# Patient Record
Sex: Female | Born: 1955 | Race: White | Hispanic: No | Smoking: Never smoker
Health system: Southern US, Community
[De-identification: ages and names within clinical notes are randomized; demographics above are authoritative.]

## PROBLEM LIST (undated history)

## (undated) DIAGNOSIS — N201 Calculus of ureter: Secondary | ICD-10-CM

## (undated) DIAGNOSIS — Z8719 Personal history of other diseases of the digestive system: Secondary | ICD-10-CM

## (undated) DIAGNOSIS — N200 Calculus of kidney: Secondary | ICD-10-CM

## (undated) DIAGNOSIS — Z8619 Personal history of other infectious and parasitic diseases: Secondary | ICD-10-CM

## (undated) DIAGNOSIS — F32A Depression, unspecified: Secondary | ICD-10-CM

## (undated) DIAGNOSIS — I219 Acute myocardial infarction, unspecified: Secondary | ICD-10-CM

## (undated) DIAGNOSIS — E119 Type 2 diabetes mellitus without complications: Secondary | ICD-10-CM

## (undated) DIAGNOSIS — M2550 Pain in unspecified joint: Secondary | ICD-10-CM

## (undated) DIAGNOSIS — Z87442 Personal history of urinary calculi: Secondary | ICD-10-CM

## (undated) DIAGNOSIS — I499 Cardiac arrhythmia, unspecified: Secondary | ICD-10-CM

## (undated) DIAGNOSIS — F419 Anxiety disorder, unspecified: Secondary | ICD-10-CM

## (undated) DIAGNOSIS — R3915 Urgency of urination: Secondary | ICD-10-CM

## (undated) DIAGNOSIS — M1990 Unspecified osteoarthritis, unspecified site: Secondary | ICD-10-CM

## (undated) HISTORY — PX: CARDIAC CATHETERIZATION: SHX172

## (undated) HISTORY — PX: CHOLECYSTECTOMY: SHX55

## (undated) HISTORY — DX: Type 2 diabetes mellitus without complications: E11.9

## (undated) HISTORY — DX: Pain in unspecified joint: M25.50

---

## 1981-05-27 HISTORY — PX: BREAST EXCISIONAL BIOPSY: SUR124

## 1988-05-27 HISTORY — PX: TUBAL LIGATION: SHX77

## 1998-05-27 HISTORY — PX: NISSEN FUNDOPLICATION: SHX2091

## 2002-05-27 HISTORY — PX: WRIST FUSION: SHX839

## 2002-10-18 ENCOUNTER — Encounter: Admission: RE | Admit: 2002-10-18 | Discharge: 2002-10-18 | Payer: Self-pay | Admitting: Family Medicine

## 2002-10-18 ENCOUNTER — Encounter: Payer: Self-pay | Admitting: Family Medicine

## 2002-11-24 ENCOUNTER — Encounter: Admission: RE | Admit: 2002-11-24 | Discharge: 2002-11-24 | Payer: Self-pay | Admitting: Family Medicine

## 2002-11-24 ENCOUNTER — Encounter: Payer: Self-pay | Admitting: Family Medicine

## 2002-12-22 ENCOUNTER — Encounter: Admission: RE | Admit: 2002-12-22 | Discharge: 2002-12-22 | Payer: Self-pay | Admitting: Family Medicine

## 2002-12-22 ENCOUNTER — Encounter: Payer: Self-pay | Admitting: Family Medicine

## 2003-01-06 ENCOUNTER — Other Ambulatory Visit: Admission: RE | Admit: 2003-01-06 | Discharge: 2003-01-06 | Payer: Self-pay

## 2003-01-10 ENCOUNTER — Encounter: Admission: RE | Admit: 2003-01-10 | Discharge: 2003-01-10 | Payer: Self-pay

## 2003-01-25 ENCOUNTER — Ambulatory Visit (HOSPITAL_COMMUNITY): Admission: RE | Admit: 2003-01-25 | Discharge: 2003-01-25 | Payer: Self-pay

## 2003-01-25 ENCOUNTER — Encounter (INDEPENDENT_AMBULATORY_CARE_PROVIDER_SITE_OTHER): Payer: Self-pay | Admitting: Specialist

## 2003-01-25 HISTORY — PX: HYSTEROSCOPY WITH D & C: SHX1775

## 2003-02-03 ENCOUNTER — Encounter (INDEPENDENT_AMBULATORY_CARE_PROVIDER_SITE_OTHER): Payer: Self-pay | Admitting: *Deleted

## 2003-02-03 ENCOUNTER — Ambulatory Visit (HOSPITAL_COMMUNITY): Admission: RE | Admit: 2003-02-03 | Discharge: 2003-02-03 | Payer: Self-pay | Admitting: Gastroenterology

## 2003-03-02 ENCOUNTER — Encounter (INDEPENDENT_AMBULATORY_CARE_PROVIDER_SITE_OTHER): Payer: Self-pay

## 2003-03-02 ENCOUNTER — Observation Stay (HOSPITAL_COMMUNITY): Admission: RE | Admit: 2003-03-02 | Discharge: 2003-03-03 | Payer: Self-pay | Admitting: Obstetrics & Gynecology

## 2003-03-02 HISTORY — PX: LAPAROSCOPIC ASSISTED VAGINAL HYSTERECTOMY: SHX5398

## 2004-07-24 ENCOUNTER — Encounter: Admission: RE | Admit: 2004-07-24 | Discharge: 2004-07-24 | Payer: Self-pay | Admitting: Family Medicine

## 2006-05-14 ENCOUNTER — Encounter: Admission: RE | Admit: 2006-05-14 | Discharge: 2006-05-14 | Payer: Self-pay | Admitting: Family Medicine

## 2006-07-01 ENCOUNTER — Inpatient Hospital Stay (HOSPITAL_COMMUNITY): Admission: RE | Admit: 2006-07-01 | Discharge: 2006-07-04 | Payer: Self-pay | Admitting: Orthopedic Surgery

## 2006-07-01 HISTORY — PX: TOTAL KNEE ARTHROPLASTY: SHX125

## 2007-01-10 ENCOUNTER — Emergency Department (HOSPITAL_COMMUNITY): Admission: EM | Admit: 2007-01-10 | Discharge: 2007-01-10 | Payer: Self-pay | Admitting: Emergency Medicine

## 2007-05-25 ENCOUNTER — Emergency Department (HOSPITAL_COMMUNITY): Admission: EM | Admit: 2007-05-25 | Discharge: 2007-05-25 | Payer: Self-pay | Admitting: *Deleted

## 2007-05-28 ENCOUNTER — Emergency Department (HOSPITAL_COMMUNITY): Admission: EM | Admit: 2007-05-28 | Discharge: 2007-05-28 | Payer: Self-pay | Admitting: Emergency Medicine

## 2007-07-21 ENCOUNTER — Encounter: Admission: RE | Admit: 2007-07-21 | Discharge: 2007-07-21 | Payer: Self-pay | Admitting: Orthopedic Surgery

## 2007-11-08 ENCOUNTER — Emergency Department (HOSPITAL_COMMUNITY): Admission: EM | Admit: 2007-11-08 | Discharge: 2007-11-08 | Payer: Self-pay | Admitting: Family Medicine

## 2007-11-13 ENCOUNTER — Emergency Department (HOSPITAL_COMMUNITY): Admission: EM | Admit: 2007-11-13 | Discharge: 2007-11-13 | Payer: Self-pay | Admitting: Emergency Medicine

## 2009-02-10 ENCOUNTER — Inpatient Hospital Stay (HOSPITAL_COMMUNITY): Admission: EM | Admit: 2009-02-10 | Discharge: 2009-02-14 | Payer: Self-pay | Admitting: Emergency Medicine

## 2009-02-10 ENCOUNTER — Ambulatory Visit: Payer: Self-pay | Admitting: Internal Medicine

## 2009-02-11 ENCOUNTER — Encounter: Payer: Self-pay | Admitting: Internal Medicine

## 2009-03-03 ENCOUNTER — Emergency Department (HOSPITAL_COMMUNITY): Admission: EM | Admit: 2009-03-03 | Discharge: 2009-03-03 | Payer: Self-pay | Admitting: Emergency Medicine

## 2009-03-15 ENCOUNTER — Encounter (INDEPENDENT_AMBULATORY_CARE_PROVIDER_SITE_OTHER): Payer: Self-pay | Admitting: *Deleted

## 2009-03-15 ENCOUNTER — Ambulatory Visit: Payer: Self-pay | Admitting: Gastroenterology

## 2009-03-15 DIAGNOSIS — R1319 Other dysphagia: Secondary | ICD-10-CM | POA: Insufficient documentation

## 2009-03-15 DIAGNOSIS — K219 Gastro-esophageal reflux disease without esophagitis: Secondary | ICD-10-CM | POA: Insufficient documentation

## 2009-03-15 DIAGNOSIS — R079 Chest pain, unspecified: Secondary | ICD-10-CM | POA: Insufficient documentation

## 2009-03-15 DIAGNOSIS — R1013 Epigastric pain: Secondary | ICD-10-CM | POA: Insufficient documentation

## 2009-03-30 ENCOUNTER — Encounter: Payer: Self-pay | Admitting: Gastroenterology

## 2009-03-30 ENCOUNTER — Encounter (INDEPENDENT_AMBULATORY_CARE_PROVIDER_SITE_OTHER): Payer: Self-pay

## 2009-03-30 ENCOUNTER — Ambulatory Visit: Payer: Self-pay | Admitting: Gastroenterology

## 2009-03-30 LAB — CONVERTED CEMR LAB: UREASE: NEGATIVE

## 2009-03-31 ENCOUNTER — Encounter: Payer: Self-pay | Admitting: Gastroenterology

## 2009-03-31 ENCOUNTER — Telehealth: Payer: Self-pay | Admitting: Gastroenterology

## 2009-04-01 ENCOUNTER — Telehealth: Payer: Self-pay | Admitting: Internal Medicine

## 2009-04-04 ENCOUNTER — Telehealth: Payer: Self-pay | Admitting: Gastroenterology

## 2009-04-04 ENCOUNTER — Ambulatory Visit: Payer: Self-pay | Admitting: Internal Medicine

## 2009-04-04 ENCOUNTER — Observation Stay (HOSPITAL_COMMUNITY): Admission: EM | Admit: 2009-04-04 | Discharge: 2009-04-05 | Payer: Self-pay | Admitting: Emergency Medicine

## 2009-04-13 ENCOUNTER — Ambulatory Visit: Payer: Self-pay | Admitting: Gastroenterology

## 2009-05-29 ENCOUNTER — Encounter (INDEPENDENT_AMBULATORY_CARE_PROVIDER_SITE_OTHER): Payer: Self-pay | Admitting: *Deleted

## 2009-05-29 ENCOUNTER — Ambulatory Visit: Payer: Self-pay | Admitting: Gastroenterology

## 2009-05-29 DIAGNOSIS — K269 Duodenal ulcer, unspecified as acute or chronic, without hemorrhage or perforation: Secondary | ICD-10-CM | POA: Insufficient documentation

## 2009-05-30 ENCOUNTER — Ambulatory Visit: Payer: Self-pay | Admitting: Cardiovascular Disease

## 2009-05-30 DIAGNOSIS — K449 Diaphragmatic hernia without obstruction or gangrene: Secondary | ICD-10-CM | POA: Insufficient documentation

## 2009-06-01 ENCOUNTER — Inpatient Hospital Stay (HOSPITAL_COMMUNITY): Admission: EM | Admit: 2009-06-01 | Discharge: 2009-06-03 | Payer: Self-pay | Admitting: Emergency Medicine

## 2009-06-12 ENCOUNTER — Encounter: Payer: Self-pay | Admitting: Internal Medicine

## 2009-06-12 ENCOUNTER — Encounter: Payer: Self-pay | Admitting: Gastroenterology

## 2009-06-13 ENCOUNTER — Telehealth: Payer: Self-pay | Admitting: Gastroenterology

## 2009-06-16 ENCOUNTER — Emergency Department (HOSPITAL_COMMUNITY): Admission: EM | Admit: 2009-06-16 | Discharge: 2009-06-16 | Payer: Self-pay | Admitting: Emergency Medicine

## 2009-06-19 ENCOUNTER — Ambulatory Visit (HOSPITAL_COMMUNITY): Admission: RE | Admit: 2009-06-19 | Discharge: 2009-06-19 | Payer: Self-pay | Admitting: Gastroenterology

## 2009-06-19 ENCOUNTER — Ambulatory Visit: Payer: Self-pay | Admitting: Gastroenterology

## 2009-06-20 ENCOUNTER — Emergency Department (HOSPITAL_COMMUNITY): Admission: EM | Admit: 2009-06-20 | Discharge: 2009-06-20 | Payer: Self-pay | Admitting: Emergency Medicine

## 2009-06-27 ENCOUNTER — Ambulatory Visit (HOSPITAL_COMMUNITY): Admission: RE | Admit: 2009-06-27 | Discharge: 2009-06-27 | Payer: Self-pay | Admitting: Surgery

## 2009-08-22 ENCOUNTER — Inpatient Hospital Stay (HOSPITAL_COMMUNITY): Admission: AD | Admit: 2009-08-22 | Discharge: 2009-08-25 | Payer: Self-pay | Admitting: Surgery

## 2009-08-22 HISTORY — PX: OTHER SURGICAL HISTORY: SHX169

## 2009-09-06 ENCOUNTER — Encounter: Payer: Self-pay | Admitting: Gastroenterology

## 2009-09-27 ENCOUNTER — Encounter: Payer: Self-pay | Admitting: Gastroenterology

## 2009-12-27 ENCOUNTER — Emergency Department (HOSPITAL_COMMUNITY): Admission: EM | Admit: 2009-12-27 | Discharge: 2009-12-27 | Payer: Self-pay | Admitting: Emergency Medicine

## 2010-03-19 ENCOUNTER — Emergency Department (HOSPITAL_COMMUNITY): Admission: EM | Admit: 2010-03-19 | Discharge: 2010-03-19 | Payer: Self-pay | Admitting: Emergency Medicine

## 2010-06-17 ENCOUNTER — Encounter: Payer: Self-pay | Admitting: Surgery

## 2010-06-26 NOTE — Procedures (Signed)
Summary: Manometry/[facility]  Manometry/[facility]   Imported By: Lester Granite 07/03/2009 07:27:49  _____________________________________________________________________  External Attachment:    Type:   Image     Comment:   External Document

## 2010-06-26 NOTE — Assessment & Plan Note (Signed)
Summary: follow up EGD/sheri   History of Present Illness Visit Type: Follow-up Visit Primary GI MD: Elie Goody MD [facility] Primary Provider: Dorothyann Peng, MD Chief Complaint: follow-up EGD  pt. still c/o abdominal pain, food getting stuck  History of Present Illness:   [name] returns for followup complaining of persistent problems with solid and liquid food dysphagia and postprandial epigastric pain. She underwent upper endoscopy with dilation and biopsies of a distal esophageal stricture. A small duodenal ulcer and erosive esophagitis was noted at endoscopy. She was subsequently hospitalized for hematemesis, felt secondary to the dilation. She did not require a transfusion. She is currently taking omeprazole on a daily basis.   GI Review of Systems    Reports abdominal pain, dysphagia with liquids, and  dysphagia with solids.     Location of  Abdominal pain: epigastric area.    Denies acid reflux, belching, bloating, chest pain, heartburn, loss of appetite, nausea, vomiting, vomiting blood, weight loss, and  weight gain.        Denies anal fissure, black tarry stools, change in bowel habit, constipation, diarrhea, diverticulosis, fecal incontinence, heme positive stool, hemorrhoids, irritable bowel syndrome, jaundice, light color stool, liver problems, rectal bleeding, and  rectal pain.   Current Medications (verified): 1)  Omeprazole 20 Mg Cpdr (Omeprazole) .... One Tablet By Mouth Once Daily  Allergies (verified): No Known Drug Allergies  Past History:  Past Medical History: Asthma Obesity Depression GERD Esophgeal stricture Duodenal ulcer, CLO negative  Past Surgical History: Cholecystectomy Nissen fundoplication 2000 Left wrist fusion TAH Toal knee replacement  Family History: Reviewed history from 03/15/2009 and no changes required. No FH of Colon Cancer:  Social History: Reviewed history from 03/15/2009 and no changes required. Widowed  2 boys 2  girls Occupation: Statistician Patient has never smoked.  Alcohol Use - no Daily Caffeine Use  2-3 per day Illicit Drug Use - no  Review of Systems       The patient complains of arthritis/joint pain and back pain.         The pertinent positives and negatives are noted as above and in the HPI. All other ROS were reviewed and were negative.   Vital Signs:  Patient profile:   55 year old female Height:      62 inches Weight:      172.13 pounds BMI:     31.60 Pulse rate:   64 / minute Pulse rhythm:   regular BP sitting:   122 / 82  (left arm)  Vitals Entered By: Milford Cage NCMA (May 29, 2009 2:59 PM)  Physical Exam  General:  Well developed, well nourished, no acute distress. Head:  Normocephalic and atraumatic. Eyes:  PERRLA, no icterus. Ears:  Normal auditory acuity. Mouth:  No deformity or lesions, dentition normal. Neck:  Supple; no masses or thyromegaly. Lungs:  Clear throughout to auscultation. Heart:  Regular rate and rhythm; no murmurs, rubs,  or bruits. Abdomen:  Soft, nontender and nondistended. No masses, hepatosplenomegaly or hernias noted. Normal bowel sounds. Msk:  Symmetrical with no gross deformities. Normal posture. Pulses:  Normal pulses noted. Extremities:  No clubbing, cyanosis, edema or deformities noted. Neurologic:  Alert and  oriented x4;  grossly normal neurologically. Cervical Nodes:  No significant cervical adenopathy. Inguinal Nodes:  No significant inguinal adenopathy. Psych:  Alert and cooperative. Normal mood and affect.  Impression & Recommendations:  Problem # 1:  OTHER DYSPHAGIA (ICD-787.29) Persistent dysphagia, and epigastric pain. Discontinue omeprazole and begin Dexilant  60 mg q.a.m. Repeat upper endoscopy with dilation. The risks, benefits and alternatives to endoscopy with possible biopsy and possible dilation were discussed with the patient and they consent to proceed. The procedure will be scheduled electively. Orders: EGD  SAV (EGD SAV) CT Chest/Abdomen w IV and Oral Contrast (CT CH/ABD w IV/Oral )  Problem # 2:  ABDOMINAL PAIN, EPIGASTRIC (ICD-789.06) As in problem #1. Schedule a CT scan of the chest, and upper abdomen. Orders: EGD SAV (EGD SAV) CT Chest/Abdomen w IV and Oral Contrast (CT CH/ABD w IV/Oral )  Problem # 3:  ULCER-DUODENAL (ICD-532.9) CLO negative. Avoid ASA and NSAIDs. Continue PPI.  Problem # 4:  SCREENING COLORECTAL-CANCER (ICD-V76.51) Screening colonoscopy recommended in September 2014.  Patient Instructions: 1)  Start Dexilant samples one tablet by mouth once daily. 2)  You have been scheduled for a CT scan. 3)  Conscious Sedation brochure given.  4)  Upper Endoscopy brochure given.  5)  Copy sent to : Dorothyann Peng, MD 6)  The medication list was reviewed and reconciled.  All changed / newly prescribed medications were explained.  A complete medication list was provided to the patient / caregiver.  Appended Document: follow up EGD/sheri    Clinical Lists Changes  Observations: Added new observation of COLONNXTDUE: 01/2013 (05/29/2009 16:16)

## 2010-06-26 NOTE — Letter (Signed)
Summary: [facility]   Imported By: Sherian Rein 10/20/2009 14:48:48  _____________________________________________________________________  External Attachment:    Type:   Image     Comment:   External Document

## 2010-06-26 NOTE — Letter (Signed)
Summary: [facility]   Imported By: Sherian Rein 09/28/2009 12:16:12  _____________________________________________________________________  External Attachment:    Type:   Image     Comment:   External Document

## 2010-06-26 NOTE — Letter (Signed)
Summary: EGD Instructions  [facility]  [address] [place], [place] [address]   Phone: [phone]  Fax: [phone]       [name]    1955/08/13    MRN: [id number]       Procedure Day /Date: Thursday January 6th, 2011     Arrival Time:  8:00am       Procedure Time:9:00am     Location of Procedure:                    _ x _ [facility] (4th Floor)    PREPARATION FOR ENDOSCOPY   On 06/01/09  THE DAY OF THE PROCEDURE:  1.   No solid foods, milk or milk products are allowed after midnight the night before your procedure.  2.   Do not drink anything colored red or purple.  Avoid juices with pulp.  No orange juice.  3.  You may drink clear liquids until 7:00am  , which is 2 hours before your procedure.                                                                                                CLEAR LIQUIDS INCLUDE: Water Jello Ice Popsicles Tea (sugar ok, no milk/cream) Powdered fruit flavored drinks Coffee (sugar ok, no milk/cream) Gatorade Juice: apple, white grape, white cranberry  Lemonade Clear bullion, consomm, broth Carbonated beverages (any kind) Strained chicken noodle soup Hard Candy   MEDICATION INSTRUCTIONS  Unless otherwise instructed, you should take regular prescription medications with a small sip of water as early as possible the morning of your procedure.          OTHER INSTRUCTIONS  You will need a responsible adult at least 53 years of age to accompany you and drive you home.   This person must remain in the waiting room during your procedure.  Wear loose fitting clothing that is easily removed.  Leave jewelry and other valuables at home.  However, you may wish to bring a book to read or an iPod/MP3 player to listen to music as you wait for your procedure to start.  Remove all body piercing jewelry and leave at home.  Total time from sign-in until discharge is approximately 2-3 hours.  You should go  home directly after your procedure and rest.  You can resume normal activities the day after your procedure.  The day of your procedure you should not:   Drive   Make legal decisions   Operate machinery   Drink alcohol   Return to work  You will receive specific instructions about eating, activities and medications before you leave.    The above instructions have been reviewed and explained to me by   Marchelle Folks    I fully understand and can verbalize these instructions _____________________________ Date _________

## 2010-06-26 NOTE — Letter (Signed)
Summary: [facility]   Imported By: Lester [place] 07/10/2009 09:16:38  _____________________________________________________________________  External Attachment:    Type:   Image     Comment:   External Document

## 2010-06-26 NOTE — Letter (Signed)
Summary: Dr Karie Soda' Office Note   Dr Karie Soda' Office Note   Imported By: Roderic Ovens 07/12/2009 14:33:17  _____________________________________________________________________  External Attachment:    Type:   Image     Comment:   External Document

## 2010-06-26 NOTE — Progress Notes (Signed)
Summary: Schedule Manometry  Phone Note Outgoing Call Call back at Home Phone [phone]   Call placed by: Christie Nottingham CMA Duncan Dull),  June 13, 2009 8:39 AM Call placed to: Patient Summary of Call: Left a message for pt to call our office back so we can give her the appt date and time of her esophageal manometry. It is scheduled on 06/19/09 at 8:30am.  Initial call taken by: Christie Nottingham CMA Duncan Dull),  June 13, 2009 8:42 AM  Follow-up for Phone Call        Pt called back and was notified of her test appt. Pt verbalized understanding of npo instructions and to stop PPI 3 days prior.  Follow-up by: Christie Nottingham CMA Duncan Dull),  June 13, 2009 9:40 AM

## 2010-08-02 ENCOUNTER — Ambulatory Visit (HOSPITAL_BASED_OUTPATIENT_CLINIC_OR_DEPARTMENT_OTHER)
Admission: RE | Admit: 2010-08-02 | Discharge: 2010-08-02 | Disposition: A | Discharge status: Home / Self Care | Payer: Worker's Compensation | Source: Ambulatory Visit | Attending: Orthopedic Surgery | Admitting: Orthopedic Surgery

## 2010-08-02 DIAGNOSIS — E669 Obesity, unspecified: Secondary | ICD-10-CM | POA: Insufficient documentation

## 2010-08-02 DIAGNOSIS — W010XXA Fall on same level from slipping, tripping and stumbling without subsequent striking against object, initial encounter: Secondary | ICD-10-CM | POA: Insufficient documentation

## 2010-08-02 DIAGNOSIS — S43429A Sprain of unspecified rotator cuff capsule, initial encounter: Secondary | ICD-10-CM | POA: Insufficient documentation

## 2010-08-02 DIAGNOSIS — Z01810 Encounter for preprocedural cardiovascular examination: Secondary | ICD-10-CM | POA: Insufficient documentation

## 2010-08-02 DIAGNOSIS — J45909 Unspecified asthma, uncomplicated: Secondary | ICD-10-CM | POA: Insufficient documentation

## 2010-08-02 DIAGNOSIS — Z01812 Encounter for preprocedural laboratory examination: Secondary | ICD-10-CM | POA: Insufficient documentation

## 2010-08-02 DIAGNOSIS — M25819 Other specified joint disorders, unspecified shoulder: Secondary | ICD-10-CM | POA: Insufficient documentation

## 2010-08-02 DIAGNOSIS — S43439A Superior glenoid labrum lesion of unspecified shoulder, initial encounter: Secondary | ICD-10-CM | POA: Insufficient documentation

## 2010-08-02 DIAGNOSIS — M19019 Primary osteoarthritis, unspecified shoulder: Secondary | ICD-10-CM | POA: Insufficient documentation

## 2010-08-02 DIAGNOSIS — S43499A Other sprain of unspecified shoulder joint, initial encounter: Secondary | ICD-10-CM | POA: Insufficient documentation

## 2010-08-02 DIAGNOSIS — S46819A Strain of other muscles, fascia and tendons at shoulder and upper arm level, unspecified arm, initial encounter: Secondary | ICD-10-CM | POA: Insufficient documentation

## 2010-08-02 HISTORY — PX: SHOULDER ARTHROSCOPY WITH SUBACROMIAL DECOMPRESSION, ROTATOR CUFF REPAIR AND BICEP TENDON REPAIR: SHX5687

## 2010-08-06 NOTE — Op Note (Signed)
[name], CHANNING NO.:  [id number]  MEDICAL RECORD NO.:  [id number]          PATIENT TYPE:  EMS  LOCATION:  MAJO                         FACILITY:  [facility]  PHYSICIAN:  Jones Broom, MD    DATE OF BIRTH:  11/10/1955  DATE OF PROCEDURE:  08/02/2010 DATE OF DISCHARGE:  08/02/2010                              OPERATIVE REPORT   PREOPERATIVE DIAGNOSES: 1. Left shoulder high-grade partial-thickness rotator cuff tear. 2. Left shoulder impingement. 3. 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  POSTOPERATIVE DIAGNOSES: 1. Left shoulder high-grade partial-thickness rotator cuff tear. 2. Left shoulder impingement. 3. 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 4. Left shoulder superior labral tear and extensive proximal biceps     tendon tear.  PROCEDURES PERFORMED: 1. Left shoulder takedown and repair of high-grade partial-thickness     rotator cuff tear. 2. Left shoulder subacromial decompression. 3. Left shoulder distal clavicle excision. 4. Left shoulder biceps tenotomy and extensive debridement of superior     labral tear.  ATTENDING SURGEON:  Berline Lopes, MD  ASSISTANT:  Teryl Lucy, MD  COMPLICATIONS:  None.  DRAINS:  None.  SPECIMENS:  None.  ESTIMATED BLOOD LOSS:  Minimal.  ANESTHESIA:  GETA with the preoperative interscalene block given by the attending anesthesiologist.  INDICATIONS FOR SURGERY:  The patient is a 55 year old female who had an injury at work on April 13, 2010.  She tripped over a pallet on the floor landing directly on her left shoulder.  Since that time, she has had extensive pain in the shoulder.  She had extensive nonoperative management with physical therapy injections and medications, but continued to have severe pain limiting her ability to get back to work. She had an MRI which revealed high-grade partial-thickness rotator  cuff tear at the anterior supraspinatus as well as some labral abnormalities and mild AC joint degenerative changes.  She elected to go forward with surgical treatment understanding risks, benefits, alternatives to surgery including but limited to risk of bleeding, infection, damage to neurovascular structures, risk of an incomplete pain relief, stiffness, and nonhealing of the tendon.  She understands all this and wished to go forward with surgery.  OPERATIVE FINDINGS:  Full range of motion without any stiffness.  I was able to subluxate her with 2+ posterior laxity, but this was symmetric with the contralateral side.  Diagnostic arthroscopy revealed extensive synovitis in the anterior structures of the joint.  Subscapularis was intact.  She had a biceps root complex injury with detachment of the superior labrum from the supraglenoid tubercle as well as extensive tearing of the proximal long head biceps tendon.  Therefore, biceps tenotomy was carried out.  She had some mild grade 1 changes of the humeral head and glenoid but extensive partial-thickness tearing of the supraspinatus with approximately 90% of the tuberosity exposed with frayed residual tendon. Residual tendon was debrided back to the tuberosity and it was felt that the tear was full thickness anteriorly in the subacromial space.  At first it was difficult to find the area of the  tear anteriorly. However, with debridement of the bursa, it was evident that this was a small full-thickness tear which extended slightly posteriorly to be able to repair the extensive partial-thickness tearing of the supraspinatus. The total tear length was approximately 15 mm and was repaired with one 5.5 mm Biocomposite corkscrew anchor after preparing the tuberosity. Standard acromioplasty and distal clavicle excision were carried out as well.  She did have some findings with thickening and fraying of the coracoacromial ligament consistent  with chronic impingement and a small- to-moderate anterior acromial spur.  PROCEDURE IN DETAIL:  The patient was identified in the preoperative holding area where I personally marked the operative site after verifying site, side, and procedure with the patient.  She had an interscalene block given by the attending anesthesiologist.  She was taken back to the operating room where she was placed in a beach-chair position with all extremities carefully padded in position.  The appropriate time-out procedure was carried out by myself.  The operative staff, anesthesia staff all verifying site, side, and procedure.  She had general anesthesia.  Antibiotics were given prior to the procedure. Left upper extremity was prepped and draped in a standard sterile fashion and a standard posterior portal was established.  The arthroscope was introduced into the joint and an anterior portal was established for needle localization above the subscapularis.  Diagnostic arthroscopy was carried out with findings as described above.  She had an extensive tearing of the proximal long head biceps tendon as well as complete elevation of the superior labrum off the supraglenoid tubercle. Therefore, this area was debrided and a large biter was used to release the biceps tendon.  The residual base was debrided back to a stable base and it was not subluxing into the joint.  The joint surfaces were examined and found to have some mild grade 1 changes.  Posterior labrum appeared intact.  No loose bodies were noted in the joint.  Posterior rotator cuff was intact but superiorly she had extensive partial- thickness tearing of the supraspinatus tendon extending about 90% across the tuberosity with significant exposed tuberosity.  The area of exposed tuberosity with residual tendon was debrided down to stable base and the arthroscope was then introduced in the subacromial space.  She was noted to have extensive  hypertrophied bursa in the subacromial space which was debrided.  The rotator cuff was then carefully examined.  Posteriorly it was completely intact, but anteriorly it was extremely thin in the area of concern and I was easily able to push through this with my shaver. The tear here was completed to a full-thickness tear and the camera was used to view through the tear and the incision was made and extended posterior slightly to allow takedown repair of the large area of high- grade partial-thickness tearing.  I debrided the tuberosity extensively and burred down to a bleeding base to promote healing.  The camera was moved to a posterolateral position for better viewing using needle localization and one 5.5 mm Biocomposite corkscrew anchor was placed percutaneously just off the lateral edge of the acromion.  This was placed about 10 mm off the articular margin to bring the tendon down to the prepared tuberosity.  Two sutures were then passed in a simple fashion and tied down bringing the tendon down nicely to the prepared tuberosity.  The remainder of the tendon appeared healthy and intact. The arthroscope was then moved back to the posterior portal and the CA ligament was carefully examined and  found to be thickened and partially frayed indicating impingement.  The ArthroCare was used to take down the coracoacromial ligament and the underlying acromial spur was examined. It was moderate in size.  The bur was used through the lateral portal to create a perfectly flat undersurface to the acromion, medial to lateral and anterior to posterior.  The ArthroCare was then used to expose the distal clavicle.  It was noted to have significant joint space narrowing and degenerative changes.  The anterior portal was moved into the [facility] joint space and bur was used through this portal to carry out a distal clavicle excision in a smooth even fashion with a resultant space of about 10 mm.  This distal  clavicle excision was viewed from lateral, anterior, and posterior portals and felt to be adequate.  The acromioplasty was viewed from lateral portal as well and found to be perfectly flat, posterior to anterior.  The arthroscope was then removed from the joint and the portals were closed with 3-0 nylon in an interrupted fashion.  Sterile dressings were then applied including Xeroform, 4 x 4s, ABDs, and tape.  The patient was placed in a sling, allowed to awaken from general anesthesia, transferred to stretcher, and taken to the recovery room in stable condition.  POSTOPERATIVE PLAN:  She will be discharged home today with her family. She will have Percocet for pain control.  She will follow up with me in 1 week for suture removal and at that point we will begin some gentle passive exercises.     Jones Broom, MD     JC/MEDQ  D:  08/02/2010  T:  08/03/2010  Job:  [id number]  Electronically Signed by Jones Broom  on 08/06/2010 04:27:12 PM

## 2010-08-10 LAB — URINALYSIS, ROUTINE W REFLEX MICROSCOPIC
Glucose, UA: NEGATIVE mg/dL
Nitrite: NEGATIVE

## 2010-08-10 LAB — CBC
MCH: 28.4 pg (ref 26.0–34.0)
MCHC: 32.3 g/dL (ref 30.0–36.0)
MCV: 88 fL (ref 78.0–100.0)
Platelets: 251 10*3/uL (ref 150–400)

## 2010-08-10 LAB — DIFFERENTIAL
Basophils Relative: 0 % (ref 0–1)
Eosinophils Absolute: 0.1 10*3/uL (ref 0.0–0.7)
Eosinophils Relative: 1 % (ref 0–5)
Monocytes Relative: 6 % (ref 3–12)
Neutrophils Relative %: 63 % (ref 43–77)

## 2010-08-10 LAB — URINE MICROSCOPIC-ADD ON

## 2010-08-10 LAB — POCT I-STAT, CHEM 8
Glucose, Bld: 104 mg/dL — ABNORMAL HIGH (ref 70–99)
HCT: 42 % (ref 36.0–46.0)
Hemoglobin: 14.3 g/dL (ref 12.0–15.0)
Potassium: 3.6 mEq/L (ref 3.5–5.1)
Sodium: 140 mEq/L (ref 135–145)
TCO2: 27 mmol/L (ref 0–100)

## 2010-08-12 LAB — URINALYSIS, ROUTINE W REFLEX MICROSCOPIC
Bilirubin Urine: NEGATIVE
Bilirubin Urine: NEGATIVE
Bilirubin Urine: NEGATIVE
Glucose, UA: NEGATIVE mg/dL
Glucose, UA: NEGATIVE mg/dL
Ketones, ur: 15 mg/dL — AB
Ketones, ur: NEGATIVE mg/dL
Nitrite: NEGATIVE
Nitrite: NEGATIVE
Protein, ur: 30 mg/dL — AB
Specific Gravity, Urine: 1.023 (ref 1.005–1.030)
Specific Gravity, Urine: 1.022 (ref 1.005–1.030)
Urobilinogen, UA: 1 mg/dL (ref 0.0–1.0)
Urobilinogen, UA: 0.2 mg/dL (ref 0.0–1.0)
pH: 6.5 (ref 5.0–8.0)
pH: 8 (ref 5.0–8.0)

## 2010-08-12 LAB — POCT I-STAT, CHEM 8
BUN: 14 mg/dL (ref 6–23)
Calcium, Ion: 1.21 mmol/L (ref 1.12–1.32)
Chloride: 107 meq/L (ref 96–112)
Creatinine, Ser: 0.8 mg/dL (ref 0.4–1.2)
Glucose, Bld: 160 mg/dL — ABNORMAL HIGH (ref 70–99)
HCT: 40 % (ref 36.0–46.0)
Hemoglobin: 13.6 g/dL (ref 12.0–15.0)
Potassium: 3.9 meq/L (ref 3.5–5.1)
Sodium: 141 meq/L (ref 135–145)
TCO2: 30 mmol/L (ref 0–100)

## 2010-08-12 LAB — URINE MICROSCOPIC-ADD ON

## 2010-08-12 LAB — URINE CULTURE: Colony Count: 9000

## 2010-08-12 LAB — CK TOTAL AND CKMB (NOT AT ARMC)
CK, MB: 1.1 ng/mL (ref 0.3–4.0)
CK, MB: 1.2 ng/mL (ref 0.3–4.0)
Relative Index: INVALID (ref 0.0–2.5)
Relative Index: INVALID (ref 0.0–2.5)

## 2010-08-12 LAB — GLUCOSE, CAPILLARY
Glucose-Capillary: 100 mg/dL — ABNORMAL HIGH (ref 70–99)
Glucose-Capillary: 147 mg/dL — ABNORMAL HIGH (ref 70–99)
Glucose-Capillary: 180 mg/dL — ABNORMAL HIGH (ref 70–99)
Glucose-Capillary: 265 mg/dL — ABNORMAL HIGH (ref 70–99)

## 2010-08-12 LAB — DIFFERENTIAL
Basophils Absolute: 0 10*3/uL (ref 0.0–0.1)
Eosinophils Relative: 0 % (ref 0–5)
Lymphocytes Relative: 10 % — ABNORMAL LOW (ref 12–46)
Lymphs Abs: 1 10*3/uL (ref 0.7–4.0)
Neutrophils Relative %: 90 % — ABNORMAL HIGH (ref 43–77)

## 2010-08-12 LAB — BASIC METABOLIC PANEL
BUN: 7 mg/dL (ref 6–23)
Calcium: 9.7 mg/dL (ref 8.4–10.5)
Chloride: 109 mEq/L (ref 96–112)
Creatinine, Ser: 0.74 mg/dL (ref 0.4–1.2)
GFR calc non Af Amer: >60 mL/min (ref >60)
GFR calc non Af Amer: >60 mL/min (ref >60)
Glucose, Bld: 111 mg/dL — ABNORMAL HIGH (ref 70–99)
Glucose, Bld: 166 mg/dL — ABNORMAL HIGH (ref 70–99)
Potassium: 3.7 mEq/L (ref 3.5–5.1)
Sodium: 142 mEq/L (ref 135–145)

## 2010-08-12 LAB — CBC
HCT: 41.8 % (ref 36.0–46.0)
Hemoglobin: 12.6 g/dL (ref 12.0–15.0)
MCHC: 34.2 g/dL (ref 30.0–36.0)
MCV: 88.2 fL (ref 78.0–100.0)
Platelets: 245 10*3/uL (ref 150–400)
RBC: 4.18 MIL/uL (ref 3.87–5.11)
RDW: 13.9 % (ref 11.5–15.5)
WBC: 9.9 10*3/uL (ref 4.0–10.5)

## 2010-08-12 LAB — TROPONIN I
Troponin I: 0.01 ng/mL (ref 0.00–0.06)
Troponin I: 0.03 ng/mL (ref 0.00–0.06)

## 2010-08-12 LAB — POCT CARDIAC MARKERS: CKMB, poc: 1.2 ng/mL (ref 1.0–8.0)

## 2010-08-12 LAB — HEMOGLOBIN A1C: Hgb A1c MFr Bld: 6.3 % — ABNORMAL HIGH (ref 4.6–6.1)

## 2010-08-12 LAB — TSH: TSH: 0.437 u[IU]/mL (ref 0.350–4.500)

## 2010-08-12 LAB — D-DIMER, QUANTITATIVE: D-Dimer, Quant: 1.03 ug/mL-FEU — ABNORMAL HIGH (ref 0.00–0.48)

## 2010-08-12 LAB — BRAIN NATRIURETIC PEPTIDE: Pro B Natriuretic peptide (BNP): 42 pg/mL (ref 0.0–100.0)

## 2010-08-15 LAB — AMYLASE, BODY FLUID: Amylase, Fluid: 34 U/L

## 2010-08-20 LAB — COMPREHENSIVE METABOLIC PANEL
ALT: 31 U/L (ref 0–35)
AST: 29 U/L (ref 0–37)
Albumin: 3.8 g/dL (ref 3.5–5.2)
CO2: 31 mEq/L (ref 19–32)
Calcium: 9.9 mg/dL (ref 8.4–10.5)
Creatinine, Ser: 0.67 mg/dL (ref 0.4–1.2)
GFR calc Af Amer: >60 mL/min (ref >60)
Sodium: 143 mEq/L (ref 135–145)
Total Protein: 6.9 g/dL (ref 6.0–8.3)

## 2010-08-20 LAB — CBC
HCT: 31.6 % — ABNORMAL LOW (ref 36.0–46.0)
MCHC: 32.5 g/dL (ref 30.0–36.0)
MCHC: 32.7 g/dL (ref 30.0–36.0)
MCHC: 33.1 g/dL (ref 30.0–36.0)
MCV: 91 fL (ref 78.0–100.0)
Platelets: 184 10*3/uL (ref 150–400)
Platelets: 226 10*3/uL (ref 150–400)
Platelets: 231 10*3/uL (ref 150–400)
RBC: 4.29 MIL/uL (ref 3.87–5.11)
RDW: 14.6 % (ref 11.5–15.5)
RDW: 14.9 % (ref 11.5–15.5)

## 2010-08-20 LAB — BASIC METABOLIC PANEL
BUN: 18 mg/dL (ref 6–23)
CO2: 28 mEq/L (ref 19–32)
Chloride: 102 mEq/L (ref 96–112)
Creatinine, Ser: 1.09 mg/dL (ref 0.4–1.2)
Glucose, Bld: 174 mg/dL — ABNORMAL HIGH (ref 70–99)
Potassium: 5.1 mEq/L (ref 3.5–5.1)

## 2010-08-20 LAB — CREATININE, SERUM
Creatinine, Ser: 0.62 mg/dL (ref 0.4–1.2)
GFR calc non Af Amer: >60 mL/min (ref >60)

## 2010-08-20 LAB — AMYLASE: Amylase: 41 U/L (ref 0–105)

## 2010-08-29 LAB — HEMOGLOBIN AND HEMATOCRIT, BLOOD
HCT: 33.2 % — ABNORMAL LOW (ref 36.0–46.0)
HCT: 33.5 % — ABNORMAL LOW (ref 36.0–46.0)
Hemoglobin: 11.1 g/dL — ABNORMAL LOW (ref 12.0–15.0)
Hemoglobin: 11.3 g/dL — ABNORMAL LOW (ref 12.0–15.0)

## 2010-08-29 LAB — COMPREHENSIVE METABOLIC PANEL
BUN: 13 mg/dL (ref 6–23)
CO2: 28 mEq/L (ref 19–32)
Calcium: 9.4 mg/dL (ref 8.4–10.5)
Chloride: 102 mEq/L (ref 96–112)
Creatinine, Ser: 0.78 mg/dL (ref 0.4–1.2)
GFR calc non Af Amer: >60 mL/min (ref >60)
Glucose, Bld: 109 mg/dL — ABNORMAL HIGH (ref 70–99)
Total Bilirubin: 0.5 mg/dL (ref 0.3–1.2)

## 2010-08-29 LAB — CBC
Hemoglobin: 12.9 g/dL (ref 12.0–15.0)
MCHC: 33.6 g/dL (ref 30.0–36.0)
MCV: 89 fL (ref 78.0–100.0)
RBC: 4.33 MIL/uL (ref 3.87–5.11)
RDW: 13.6 % (ref 11.5–15.5)

## 2010-08-29 LAB — DIFFERENTIAL
Basophils Absolute: 0 10*3/uL (ref 0.0–0.1)
Lymphocytes Relative: 36 % (ref 12–46)
Lymphs Abs: 2.9 10*3/uL (ref 0.7–4.0)
Neutrophils Relative %: 56 % (ref 43–77)

## 2010-08-29 LAB — PROTIME-INR
INR: 0.91 (ref 0.00–1.49)
Prothrombin Time: 12.2 seconds (ref 11.6–15.2)

## 2010-08-30 LAB — URINALYSIS, ROUTINE W REFLEX MICROSCOPIC
Bilirubin Urine: NEGATIVE
Hgb urine dipstick: NEGATIVE
Nitrite: NEGATIVE
Specific Gravity, Urine: 1.017 (ref 1.005–1.030)
Urobilinogen, UA: 0.2 mg/dL (ref 0.0–1.0)
pH: 6 (ref 5.0–8.0)

## 2010-08-30 LAB — POCT I-STAT, CHEM 8
Calcium, Ion: 1.15 mmol/L (ref 1.12–1.32)
Chloride: 102 mEq/L (ref 96–112)
Glucose, Bld: 100 mg/dL — ABNORMAL HIGH (ref 70–99)
HCT: 36 % (ref 36.0–46.0)
Hemoglobin: 12.2 g/dL (ref 12.0–15.0)

## 2010-08-30 LAB — URINE MICROSCOPIC-ADD ON

## 2010-08-31 LAB — CBC
HCT: 35.6 % — ABNORMAL LOW (ref 36.0–46.0)
HCT: 37.3 % (ref 36.0–46.0)
HCT: 39.7 % (ref 36.0–46.0)
Hemoglobin: 11.9 g/dL — ABNORMAL LOW (ref 12.0–15.0)
Hemoglobin: 13.2 g/dL (ref 12.0–15.0)
MCHC: 33.2 g/dL (ref 30.0–36.0)
MCHC: 33.3 g/dL (ref 30.0–36.0)
MCHC: 33.4 g/dL (ref 30.0–36.0)
MCV: 88.9 fL (ref 78.0–100.0)
MCV: 89.5 fL (ref 78.0–100.0)
MCV: 89.9 fL (ref 78.0–100.0)
MCV: 90.2 fL (ref 78.0–100.0)
Platelets: 241 10*3/uL (ref 150–400)
Platelets: 243 10*3/uL (ref 150–400)
Platelets: 247 10*3/uL (ref 150–400)
RDW: 13.1 % (ref 11.5–15.5)
RDW: 13.4 % (ref 11.5–15.5)
RDW: 13.6 % (ref 11.5–15.5)
WBC: 9.2 10*3/uL (ref 4.0–10.5)

## 2010-08-31 LAB — BASIC METABOLIC PANEL
BUN: 11 mg/dL (ref 6–23)
BUN: 15 mg/dL (ref 6–23)
CO2: 30 mEq/L (ref 19–32)
CO2: 30 mEq/L (ref 19–32)
Calcium: 9 mg/dL (ref 8.4–10.5)
Chloride: 106 mEq/L (ref 96–112)
Chloride: 109 mEq/L (ref 96–112)
Creatinine, Ser: 0.72 mg/dL (ref 0.4–1.2)
Creatinine, Ser: 0.79 mg/dL (ref 0.4–1.2)
Glucose, Bld: 105 mg/dL — ABNORMAL HIGH (ref 70–99)
Glucose, Bld: 106 mg/dL — ABNORMAL HIGH (ref 70–99)
Potassium: 4 mEq/L (ref 3.5–5.1)
Potassium: 4 mEq/L (ref 3.5–5.1)
Sodium: 141 mEq/L (ref 135–145)

## 2010-08-31 LAB — POCT I-STAT, CHEM 8
Calcium, Ion: 1.23 mmol/L (ref 1.12–1.32)
Chloride: 105 mEq/L (ref 96–112)
Glucose, Bld: 109 mg/dL — ABNORMAL HIGH (ref 70–99)
HCT: 41 % (ref 36.0–46.0)
Hemoglobin: 13.9 g/dL (ref 12.0–15.0)
TCO2: 29 mmol/L (ref 0–100)

## 2010-08-31 LAB — PROTIME-INR: INR: 0.9 (ref 0.00–1.49)

## 2010-08-31 LAB — D-DIMER, QUANTITATIVE: D-Dimer, Quant: 0.37 ug/mL-FEU (ref 0.00–0.48)

## 2010-08-31 LAB — POCT CARDIAC MARKERS

## 2010-08-31 LAB — LIPID PANEL
Cholesterol: 224 mg/dL — ABNORMAL HIGH (ref 0–200)
LDL Cholesterol: UNDETERMINED mg/dL (ref 0–99)
Triglycerides: 608 mg/dL — ABNORMAL HIGH (ref <150)
VLDL: UNDETERMINED mg/dL (ref 0–40)

## 2010-08-31 LAB — CARDIAC PANEL(CRET KIN+CKTOT+MB+TROPI)
CK, MB: 0.7 ng/mL (ref 0.3–4.0)
Relative Index: INVALID (ref 0.0–2.5)
Relative Index: INVALID (ref 0.0–2.5)
Total CK: 48 U/L (ref 7–177)
Total CK: 54 U/L (ref 7–177)
Troponin I: 0.01 ng/mL (ref 0.00–0.06)
Troponin I: 0.02 ng/mL (ref 0.00–0.06)

## 2010-08-31 LAB — IRON AND TIBC
Iron: 58 ug/dL (ref 42–135)
TIBC: 369 ug/dL (ref 250–470)
UIBC: 311 ug/dL

## 2010-08-31 LAB — HEMOGLOBIN A1C: Hgb A1c MFr Bld: 6.3 % — ABNORMAL HIGH (ref 4.6–6.1)

## 2010-08-31 LAB — APTT: aPTT: 34 seconds (ref 24–37)

## 2010-09-10 ENCOUNTER — Emergency Department (HOSPITAL_COMMUNITY): Payer: BC Managed Care – HMO

## 2010-09-10 ENCOUNTER — Emergency Department (HOSPITAL_COMMUNITY)
Admission: EM | Admit: 2010-09-10 | Discharge: 2010-09-10 | Disposition: A | Discharge status: Home / Self Care | Payer: BC Managed Care – HMO | Attending: Emergency Medicine | Admitting: Emergency Medicine

## 2010-09-10 DIAGNOSIS — R0789 Other chest pain: Secondary | ICD-10-CM | POA: Insufficient documentation

## 2010-09-10 DIAGNOSIS — R0602 Shortness of breath: Secondary | ICD-10-CM | POA: Insufficient documentation

## 2010-09-10 DIAGNOSIS — J45909 Unspecified asthma, uncomplicated: Secondary | ICD-10-CM | POA: Insufficient documentation

## 2010-09-10 DIAGNOSIS — K219 Gastro-esophageal reflux disease without esophagitis: Secondary | ICD-10-CM | POA: Insufficient documentation

## 2010-09-10 LAB — BASIC METABOLIC PANEL
BUN: 8 mg/dL (ref 6–23)
GFR calc Af Amer: >60 mL/min (ref >60)
GFR calc non Af Amer: >60 mL/min (ref >60)
Potassium: 3.7 mEq/L (ref 3.5–5.1)
Sodium: 141 mEq/L (ref 135–145)

## 2010-09-10 LAB — CK TOTAL AND CKMB (NOT AT ARMC)
CK, MB: 1.3 ng/mL (ref 0.3–4.0)
Relative Index: 1.1 (ref 0.0–2.5)
Total CK: 122 U/L (ref 7–177)

## 2010-09-10 LAB — DIFFERENTIAL
Basophils Absolute: 0 10*3/uL (ref 0.0–0.1)
Basophils Relative: 0 % (ref 0–1)
Eosinophils Absolute: 0.2 10*3/uL (ref 0.0–0.7)
Eosinophils Relative: 3 % (ref 0–5)
Neutrophils Relative %: 57 % (ref 43–77)

## 2010-09-10 LAB — CBC
Platelets: 230 10*3/uL (ref 150–400)
RDW: 13.7 % (ref 11.5–15.5)
WBC: 6.7 10*3/uL (ref 4.0–10.5)

## 2010-09-10 LAB — TROPONIN I
Troponin I: 0.02 ng/mL (ref 0.00–0.06)
Troponin I: 0.02 ng/mL (ref 0.00–0.06)

## 2010-10-12 NOTE — Discharge Summary (Signed)
   NAME:  [name], [name]                            ACCOUNT NO.:  [id number]   MEDICAL RECORD NO.:  [id number]                   PATIENT TYPE:  OBV   LOCATION:  9325                                 FACILITY:  [facility]   PHYSICIAN:  Ilda Mori, M.D.                DATE OF BIRTH:  [birth year]   DATE OF ADMISSION:  03/02/2003  DATE OF DISCHARGE:  03/03/2003                                 DISCHARGE SUMMARY   FINAL DIAGNOSES:  Myomata Uteri, pelvic pain, menorrhagia.   SECONDARY DIAGNOSES:  None.   PROCEDURE:  Laparoscopically assisted transvaginal hysterectomy with  bilateral salpingo-oophorectomy.   COMPLICATIONS:  None.   CONDITION ON DISCHARGE:  Improved.   HISTORY:  This is a 55 year old gravida 4, para 4, who was suffering with  heavy bleeding, pelvic pain and was known to have Myomata Uteri.  A D&C  hysteroscopy was performed with no relief of her symptomatology.  She  requested to have a hysterectomy performed for removal of her myomatous  uterus and to correct her symptoms of pelvic pain and heavy bleeding.  Because of her age, the decision was made to proceed with bilateral salpingo-  oophorectomy.  She was taken to the operating room on the day of admission  where a laparoscopically assisted bilateral salpingo-oophorectomy and  transvaginal hysterectomy was performed without complication.  The patient  was observed for 24 hours.  Her hemoglobin postoperatively was 10.9 which  was consistent with intraoperative blood loss and her white count was 10.9.  She was afebrile and her pulse was normal.  She was eating well, voiding  without difficulty and ambulating and her pain was well-controlled with oral  analgesics.  She was therefore felt to be ready for discharge from  observation.  She was told to eat a regular diet, limit her activity and she  was given Tylox 25 tablets to take 1-2 every 4 hours for pain, and asked to  try over the counter pain medication, Advil or Aleve prior  to taking the  Tylox.  She was told to call the office for an appointment in three weeks  for followup.   LABORATORY DATA:  Admission hemoglobin 14.4, WBC 8.6.  Urinalysis was  benign.  Her postoperative hemoglobin was 10.2 with WBC 10.9.                                               Ilda Mori, M.D.    RK/MEDQ  D:  03/03/2003  T:  03/03/2003  Job:  [id number]

## 2010-10-12 NOTE — Discharge Summary (Signed)
[name], [name]                  ACCOUNT NO.:  [id number]   MEDICAL RECORD NO.:  [id number]          PATIENT TYPE:  INP   LOCATION:  5041                         FACILITY:  [facility]   PHYSICIAN:  Burnard Bunting, M.D.    DATE OF BIRTH:  [birth year]   DATE OF ADMISSION:  07/01/2006  DATE OF DISCHARGE:  07/04/2006                               DISCHARGE SUMMARY   DISCHARGE DIAGNOSIS:  Right knee arthritis.   SECONDARY DIAGNOSIS:  None.   OPERATION/PROCEDURE:  Right total knee replacement performed June 28, 2006.   HOSPITAL COURSE:  [name] is a 55 year old patient with end-stage  knee arthritis.  After failure with conservative management, she  presents for total replacement after explanation of risks and benefits.  Patient underwent total knee replacement on July 01, 2006.  Patient  tolerated the procedure well without any immediate complications.  She  was started on Coumadin for DVT prophylaxis, physical therapy for  mobilization and CPM machine for knee range of motion.  Antibiotics were  continued the first 24 hours after the procedure.  Patient did mobilize  with physical therapy.  Hemoglobin 10.5 on postop day number 2.  INR was  down to 1.7 at that time.  Incision was intact on postop day number 3.  She did well with the stairs at that time and was actually discharged on  postop day number 3 in good condition.  She will follow up with me in 7  days for suture removal.   DISCHARGE MEDICATIONS:  Include:  1. Coumadin 5 mg p.o. daily with an INR of 2.0-2.5.  2. Percocet 1-2 p.o. q.3-4 hours p.r.n. pain.  3. Robaxin 500 mg p.o. q.8 hours p.r.n. spasm.   She is discharged home in good condition.      Burnard Bunting, M.D.  Electronically Signed     GSD/MEDQ  D:  09/04/2006  T:  09/04/2006  Job:  [id number]

## 2010-10-12 NOTE — Op Note (Signed)
NAME:  [name], [name]                            ACCOUNT NO.:  [id number]   MEDICAL RECORD NO.:  [id number]                   PATIENT TYPE:  AMB   LOCATION:  ENDO                                 FACILITY:  [facility]   PHYSICIAN:  Petra Kuba, M.D.                 DATE OF BIRTH:  05-30-1955   DATE OF PROCEDURE:  02/03/2003  DATE OF DISCHARGE:                                 OPERATIVE REPORT   PROCEDURE:  Colonoscopy.   INDICATIONS FOR PROCEDURE:  Bright red blood per rectum, abdominal pain.  Consent was signed after risks, benefits, methods, and options were  thoroughly discussed in the office.   MEDICINES USED:  Fentanyl 125, Versed 12.   DESCRIPTION OF PROCEDURE:  Rectal inspection was pertinent for external  hemorrhoids, small. Digital exam was negative. The pediatric video  adjustable colonoscope was inserted and with lots of difficulty due to long  looping tortuous colon was able to be advanced to the cecum. No obvious  abnormality or blood was seen on insertion. To advance to the cecum required  rolling her on her back and on her right side and then back to her back and  finally back to her left side with various abdominal pressures. The cecum  was identified by the appendiceal orifice the ileocecal valve. In fact, the  scope was inserted a short ways into the terminal ileum which was normal.  The prep was adequate. There was some liquid stool that required washing and  suctioning. On slow withdrawal through colon, no abnormalities were seen.  When we fell back around the loop, we did try to readvance around the  corners to decrease the chance of missing things but on slow withdrawal back  to the rectum, no abnormalities were seen. Anal rectal pull through and  retroflexion confirmed a small hemorrhoid with a tear. The scope was  reinserted a short ways up the left side of the colon, air was suctioned,  scope removed. The patient tolerated the procedure well. There was no  obvious or immediate complications.   ENDOSCOPIC DIAGNOSIS:  1. Internal and external hemorrhoids with a tear.  2. Long looping tortuous colon.  3. Otherwise within normal limits to the terminal ileum.    PLAN:  Happy to see back p.r.n., consider antispasmodics or small bowel  follow through next for further workup and plans, otherwise, repeat  screening in five years. May want to consider a one time barium enema or  virtual colonoscopy at that junction based on tortuosity. Her pain is  probably due to adhesions based on the diagnostic CT and ultrasound.                                               Petra Kuba, M.D.  MEM/MEDQ  D:  02/03/2003  T:  02/03/2003  Job:  [id number]   cc:   Bryan Lemma. Manus Gunning, M.D.  [address] [place]  [place] [address]  Fax: [phone]   Ronda Fairly. Galen Daft, M.D.  [address], [address]  [place]  [place] [address]  Fax: [phone]

## 2010-10-12 NOTE — H&P (Signed)
NAME:  [name], [name]                            ACCOUNT NO.:  [id number]   MEDICAL RECORD NO.:  [id number]                   PATIENT TYPE:  OBV   LOCATION:  NA                                   FACILITY:  [facility]   PHYSICIAN:  Ilda Mori, M.D.                DATE OF BIRTH:  09-04-1955   DATE OF ADMISSION:  DATE OF DISCHARGE:                                HISTORY & PHYSICAL   DATE OF ADMISSION:  Scheduled date of admission is March 02, 2003.   CHIEF COMPLAINT:  Pelvic pain, myoma uteri, menorrhagia.   HISTORY OF PRESENT ILLNESS:  This is a 55 year old gravida 4, para 4 who has  had a greater than two month history of bilateral lower abdominal pain and  heavy menstrual periods.  The patient had a hysteroscopy and D and C by  another gynecologist in August which showed secretory endometrium and a  small endometrial polyp.  The patient has persisted in requiring narcotics  for pain subsequent to the surgery and her periods have continued to be  heavy.  She therefore requests hysterectomy.   PAST MEDICAL HISTORY:   ALLERGIES:  The patient has no drug allergies.   MEDICATIONS:  Patient is currently on hydrocodone for pain, an over-the-  counter inhaler for asthma.   MEDICAL PROBLEMS:  Include mild asthma.   PAST SURGICAL HISTORY:  Gallbladder and hernia repair and a  tubal ligation  in 1990.   SOCIAL HISTORY:  She does not smoke.  She has been married for 24 years.  She works at [employer].   FAMILY HISTORY:  Positive for coronary artery disease in her father,  otherwise it is negative for diabetes, hypertension or pelvic cancers.   REVIEW OF SYMPTOMS:  Noncontributory.   PHYSICAL EXAMINATION:  GENERAL:  She is 5 feet 3 inches tall and weight is  164 pounds.  VITAL SIGNS:  Blood pressure 120/78, afebrile, pulse is normal.  HEENT:  Ears, nose and throat are negative for thyromegaly or  lymphadenopathy.  CHEST:  Clear to auscultation and percussion.  HEART:  Regular sinus  rhythm without murmurs or gallops.  ABDOMEN:  Soft,  without hepatosplenomegaly.  PELVIC:  Her pelvic examination reveals normal external genitalia, vaginal  vault is normal.  Cervix is normal. Uterus is anterior with a palpable myoma  in the lower segment.  The total uterine size is 10 to 12 weeks.  Her last  Pap smear in August 2004 was normal.  She had an ultrasound performed on  January 10, 2003 which showed a total uterine size of 12 cm by 7 cm by 6 cm  with a fibroid projecting off the lower uterine segment which measured 4 cm  by 3 cm by 3 cm.  Her ovaries were essentially normal size with small  complex cystic structures, less than 2 cm in size.  The findings were  discussed with  the patient in the office.  The fact that fibroids generally  do not cause pelvic pain and the fact that the ovarian cyst appeared benign  were also discussed with the patient.  The patient states that she feels  that the pain is coming from her fibroid and is willing to undergo surgery  to seek relief from this chronic pelvic pain.  The risks of bladder and  ureteral injury, bleeding, infection, and pulmonary embolus were discussed  with the patient and she voiced understanding for these risks.  Patient is  also informed of the options for ovarian removal or retention and she  elected to have her ovaries removed at the time of surgery, even though this  would place her in the menopause.   PLAN:  The patient is therefore admitted for laparoscopically assisted  vaginal hysterectomy and bilateral salpingo-oophorectomy.                                               Ilda Mori, M.D.    RK/MEDQ  D:  03/01/2003  T:  03/01/2003  Job:  [id number]

## 2010-10-12 NOTE — Op Note (Signed)
[name], [name]                  ACCOUNT NO.:  [id number]   MEDICAL RECORD NO.:  [id number]          PATIENT TYPE:  INP   LOCATION:  2899                         FACILITY:  [facility]   PHYSICIAN:  Burnard Bunting, M.D.    DATE OF BIRTH:  1956/02/19   DATE OF PROCEDURE:  07/01/2006  DATE OF DISCHARGE:                               OPERATIVE REPORT   PREOPERATIVE DIAGNOSIS:  Right knee arthritis.   POSTOPERATIVE DIAGNOSIS:  Right knee arthritis.   PROCEDURE:  Right total knee replacement.   SURGEON, ATTENDING:  Burnard Bunting, M.D.   ASSISTANT:  Jerolyn Shin. Tresa Res, M.D.   ANESTHESIA:  General endotracheal.   ESTIMATED BLOOD LOSS:  150 mL.   DRAINS:  None.   TOURNIQUET TIME:  1 hour 45 minutes at 300 mmHg.   INDICATIONS:  [name] is a 55 year old patient with end-stage right  knee arthritis, who has failed conservative management.  She presents  now for operative management after discussion of risks and benefits.   COMPONENTS IMPLANTED:  Cemented PCL-sacrificing 2.5 femur, 2 tibia, 10  poly, 32 patella.   DESCRIPTION OF PROCEDURE IN DETAIL:  The patient was brought to the  operating room, where general endotracheal anesthesia was induced.  Preoperative IV antibiotics were administered. The right leg was prepped  with DuraPrep solution and draped in a sterile manner.  Collier Flowers was used  to cover the operative field.  The leg was elevated and exsanguinated  with an Esmarch and the calf tourniquet was inflated.  The anterior  approach to the knee was made with the knee over a bolster.  The skin  and subcutaneous tissues were sharply divided.  A medial parapatellar  arthrotomy was made, the location of which was marked with a suture at  the superomedial border of the incision.  The patella was everted.  Significant wear was noted within the medial compartment.  The fat pad  was partially excised for visualization.  The lateral patellofemoral  ligament was released.  Osteophytes were  removed from the tibia.  Two  pins were placed in the distal anteromedial femur and proximal  anteromedial tibia.  The registration points were then obtained,  beginning with the hips in rotation, medial and lateral malleolus, and  various points around the knee.  In accordance with preoperative  templating, a computer-generated model was generated.  A cut was then  made on the tibia perpendicular to the mechanical axis.  The tensioning  device was then checked in both full extension and 90 degrees of  flexion.  The distal femoral cut was then made and checked with spacer  blocks, and good extension and alignment were achieved.  At this time,  the chamfer cuts were made, with collateral ligaments protected.  No  notching occurred.  The tibial baseplate was then placed and the keel  punch was then performed.  The patella was then cut in freehand fashion  with a 22-mm resection, going to 11 mm, with a 32 patella placed.  With  trial components in position, the patient hemodynamics full extension,  full flexion,  good patellar tracking, no-touch technique.  The gaps were  balanced in flexion and extension.  The trial components were removed.  Pulsatile irrigation was performed.  The incision true components were  then cemented into position.  The cement was allowed to harden and  excess cement was removed.  The tourniquet was removed.  Bleeding points  encountered were controlled using electrocautery.  The arthrotomy was  then closed over a Hemovac drain using interrupted, inverted #1 Vicryl  suture, 0 Vicryl suture, 2-0 Vicryl suture, and ending with skin  staples.  The pin site incisions were irrigated and closed using 3-0  nylon.  The patient had good perfusion of the foot at the conclusion of  the case.  The knee was wrapped with a bulky dressing and placed into a  knee immobilizer.  The patient tolerated the procedure well without  immediate complications.  It should be noted that Dr.  Tresa Res was  required for retraction of the central neurovascular structures during  the case.      Burnard Bunting, M.D.  Electronically Signed     GSD/MEDQ  D:  07/01/2006  T:  07/01/2006  Job:  [id number]

## 2010-10-12 NOTE — Op Note (Signed)
   NAME:  [name], [name]                            ACCOUNT NO.:  [id number]   MEDICAL RECORD NO.:  [id number]                   PATIENT TYPE:  AMB   LOCATION:  SDC                                  FACILITY:  [facility]   PHYSICIAN:  Ronda Fairly. Galen Daft, M.D.              DATE OF BIRTH:  19-Feb-1956   DATE OF PROCEDURE:  01/25/2003  DATE OF DISCHARGE:                                 OPERATIVE REPORT   PREOPERATIVE DIAGNOSES:  1. Menometrorrhagia.  2. Thickened endometrium.   POSTOPERATIVE DIAGNOSES:  1. Menometrorrhagia.  2. Thickened endometrium.   PROCEDURE:  Hysteroscopy with dilatation and curettage.   SURGEON:  Ronda Fairly. Galen Daft, M.D.   ANESTHESIA:  General.   ESTIMATED BLOOD LOSS:  Less than 10 mL.   FLUID DEFICIT:  80 mL.   COMPLICATIONS:  None.   OPERATIVE COURSE:  Time out was performed.  [name] had Betadine prep,  sterile technique, bladder catheterized.  The exam under anesthesia revealed  the uterus to be slightly enlarged, otherwise unremarkable with no adnexal  masses.  The uterus was grasped with a single-tooth tenaculum.  Dilatation  was not necessary, as the hysteroscope readily accepted inside the cavity.  There was evidence of endometrial fluffiness, otherwise unremarkable.  A  curettage was carried out and the endometrial polyp forceps were utilized as  well for adequate tissue sample.  There were no complications from the  procedure.  All instrument, sponge, and needle counts were correct, and  there was no active bleeding at the termination of the procedure.  The  patient left the operating room in stable condition.                                               Ronda Fairly. Galen Daft, M.D.    NJT/MEDQ  D:  01/25/2003  T:  01/25/2003  Job:  [id number]

## 2010-10-12 NOTE — Op Note (Signed)
NAME:  [name], [name]                            ACCOUNT NO.:  [id number]   MEDICAL RECORD NO.:  [id number]                   PATIENT TYPE:  OBV   LOCATION:  9399                                 FACILITY:  [facility]   PHYSICIAN:  Ilda Mori, M.D.                DATE OF BIRTH:  03/31/1956   DATE OF PROCEDURE:  03/02/2003  DATE OF DISCHARGE:                                 OPERATIVE REPORT   PREOPERATIVE DIAGNOSES:  1. Myomata uteri.  2. Pelvic pain.  3. Menorrhagia.   POSTOPERATIVE DIAGNOSES:  1. Myomata uteri.  2. Pelvic pain.  3. Menorrhagia.   PROCEDURE:  Laparoscopically-assisted vaginal hysterectomy and bilateral  salpingo-oophorectomy.   SURGEON:  Ilda Mori, M.D.   ASSISTANT:  Malva Limes, M.D.   ANESTHESIA:  General endotracheal.   ESTIMATED BLOOD LOSS:  300 mL.   FINDINGS:  A 300 g uterus with multiple myomas, normal-appearing ovaries,  normal tubes status post ligation.   INDICATIONS:  This is a 55 year old gravida 4, para 4, who developed over  the last six months heavy menstrual bleeding associated with chronic pelvic  pain.  The patient on ultrasound and exam was noted to have significantly  enlarged uterus secondary to myoma.  The patient underwent a hysteroscopy-  D&C, which showed secretory endometrium and a small endometrial polyp;  however, status post this surgery the patient's pain and menorrhagia  persisted and she requested surgery.  Because of her age a decision was made  by the patient and the physician to remove her ovaries.   DESCRIPTION OF PROCEDURE:  The patient was taken to the operating room and  placed in the supine position, where general endotracheal anesthesia was  induced.  She was then placed in the dorsal lithotomy position and the  abdomen, perineum, and vagina were prepped and draped in sterile fashion.  A  Hulka tenaculum was introduced through the cervix into the endocervical  canal and affixed to the anterior lip of the  cervix.  The surgeon regowned  and gloved.  An incision was made at the base of the umbilicus, a Veress  needle was introduced into the peritoneal cavity, and pneumoperitoneum was  created.  A 10 mm laparoscopic trocar was introduced into the  pneumoperitoneum and a laparoscope was then placed.  Under direct  visualization accessory instruments were placed in the right and left lower  quadrant.  These were 5 mm ports.  The ureters were identified bilaterally.  The infundibulopelvic ligament was identified, grasped with a tripolar  cautery and cutting instrument, and cauterized and cut.  The round ligaments  were grasped, cauterized, and cut, and intervening tissue was cauterized so  that the ovary, tube, and uterine fundus was free.  This procedure was then  repeated in identical fashion on the contralateral side.  The surgeons then  went to the vaginal site, where the cervix was infiltrated with dilute  epinephrine solution 1:200,000, with 0.5% Marcaine.  This solution was  placed in the paracervical tissues.  The cervix was circumcised and the  vaginal mucosa was dissected free.  The posterior cul-de-sac was entered  sharply and the uterosacral ligaments and base of the cardinal ligaments  were clamped, cut, and ligated.  Using the LigaSure instrument, the rest of  the cardinal ligaments up to the uterines were clamped, cauterized, and cut.  The anterior cul-de-sac was then entered and the remainder of the base of  the broad ligament, including the uterine arteries, were clamped,  cauterized, and cut.  At this point the uterus was delivered posteriorly  without the need for morcellation.  The remainder of the broad ligament was  clamped, cauterized, and cut with the LigaSure instrument.  The specimen was  thus freed.  The posterior cuff was closed with a running interlocking 1  Vicryl suture from just above the uterosacrals on each side.  The cul-de-sac  was then closed with a suture from  uterosacral to uterosacral.  The  peritoneum was closed with a pursestring suture.  The anterior cuff was then  closed with figure-of-eight sutures.  The surgeon regloved and the  pneumoperitoneum was recreated and the pedicles were viewed with the  laparoscope under low pressure and were dry.  The procedure was then  terminated.  The remaining gas was allowed to escape.  The instruments were  removed.  The 10 mm port was closed with a single subcutaneous suture and a  subcuticular suture.  The 5 mm ports were closed with Dermabond adhesive.  The procedure was then terminated and the patient left the OR in good  condition.                                               Ilda Mori, M.D.    RK/MEDQ  D:  03/02/2003  T:  03/02/2003  Job:  [id number]

## 2010-11-21 ENCOUNTER — Ambulatory Visit (HOSPITAL_COMMUNITY)
Admission: EM | Admit: 2010-11-21 | Discharge: 2010-11-22 | Disposition: A | Discharge status: Home / Self Care | Payer: BC Managed Care – HMO | Attending: Cardiology | Admitting: Cardiology

## 2010-11-21 ENCOUNTER — Emergency Department (HOSPITAL_COMMUNITY): Payer: BC Managed Care – HMO

## 2010-11-21 DIAGNOSIS — R0989 Other specified symptoms and signs involving the circulatory and respiratory systems: Secondary | ICD-10-CM | POA: Insufficient documentation

## 2010-11-21 DIAGNOSIS — R0609 Other forms of dyspnea: Secondary | ICD-10-CM | POA: Insufficient documentation

## 2010-11-21 DIAGNOSIS — K219 Gastro-esophageal reflux disease without esophagitis: Secondary | ICD-10-CM | POA: Insufficient documentation

## 2010-11-21 DIAGNOSIS — J45909 Unspecified asthma, uncomplicated: Secondary | ICD-10-CM | POA: Insufficient documentation

## 2010-11-21 DIAGNOSIS — E119 Type 2 diabetes mellitus without complications: Secondary | ICD-10-CM | POA: Insufficient documentation

## 2010-11-21 DIAGNOSIS — Z8249 Family history of ischemic heart disease and other diseases of the circulatory system: Secondary | ICD-10-CM | POA: Insufficient documentation

## 2010-11-21 DIAGNOSIS — R079 Chest pain, unspecified: Secondary | ICD-10-CM | POA: Insufficient documentation

## 2010-11-21 LAB — DIFFERENTIAL
Eosinophils Absolute: 0.1 10*3/uL (ref 0.0–0.7)
Lymphs Abs: 2.6 10*3/uL (ref 0.7–4.0)
Monocytes Relative: 5 % (ref 3–12)
Neutro Abs: 4.7 10*3/uL (ref 1.7–7.7)
Neutrophils Relative %: 61 % (ref 43–77)

## 2010-11-21 LAB — HEMOGLOBIN A1C
Hgb A1c MFr Bld: 6.8 % — ABNORMAL HIGH (ref <5.7)
Mean Plasma Glucose: 148 mg/dL — ABNORMAL HIGH (ref <117)

## 2010-11-21 LAB — CARDIAC PANEL(CRET KIN+CKTOT+MB+TROPI)
CK, MB: 1.7 ng/mL (ref 0.3–4.0)
Relative Index: INVALID (ref 0.0–2.5)
Troponin I: <0.3 ng/mL (ref <0.30)

## 2010-11-21 LAB — POCT I-STAT, CHEM 8
BUN: 14 mg/dL (ref 6–23)
Calcium, Ion: 1.18 mmol/L (ref 1.12–1.32)
Chloride: 105 mEq/L (ref 96–112)

## 2010-11-21 LAB — CBC
Hemoglobin: 13.7 g/dL (ref 12.0–15.0)
MCH: 29.8 pg (ref 26.0–34.0)
MCV: 89.3 fL (ref 78.0–100.0)
RBC: 4.6 MIL/uL (ref 3.87–5.11)

## 2010-11-21 LAB — CK TOTAL AND CKMB (NOT AT ARMC)
CK, MB: 1.7 ng/mL (ref 0.3–4.0)
Relative Index: INVALID (ref 0.0–2.5)
Total CK: 50 U/L (ref 7–177)

## 2010-11-21 LAB — TROPONIN I
Troponin I: <0.3 ng/mL (ref <0.30)
Troponin I: <0.3 ng/mL (ref <0.30)

## 2010-11-22 LAB — BASIC METABOLIC PANEL
BUN: 16 mg/dL (ref 6–23)
CO2: 29 mEq/L (ref 19–32)
Calcium: 8 mg/dL — ABNORMAL LOW (ref 8.4–10.5)
GFR calc non Af Amer: >60 mL/min (ref >60)
Glucose, Bld: 118 mg/dL — ABNORMAL HIGH (ref 70–99)

## 2010-11-22 LAB — CBC
MCH: 29.8 pg (ref 26.0–34.0)
MCHC: 33 g/dL (ref 30.0–36.0)
RDW: 13.1 % (ref 11.5–15.5)

## 2010-11-22 LAB — CARDIAC PANEL(CRET KIN+CKTOT+MB+TROPI)
CK, MB: 1.6 ng/mL (ref 0.3–4.0)
Total CK: 34 U/L (ref 7–177)

## 2010-12-15 NOTE — Discharge Summary (Signed)
  NAMEPANAYIOTA, [name]                  ACCOUNT NO.:  [id number]  MEDICAL RECORD NO.:  [id number]  LOCATION:  6529                         FACILITY:  [facility]  PHYSICIAN:  Pamella Pert, MD DATE OF BIRTH:  1955/06/20  DATE OF ADMISSION:  11/21/2010 DATE OF DISCHARGE:  11/22/2010                              DISCHARGE SUMMARY   DISCHARGE DIAGNOSES: 1. Chest pain secondary to gastroesophageal reflux disease. 2. Bronchial asthma. 3. Diabetes mellitus with a hemoglobin A1c of 6.8.  HISTORY:  [name] is a 55 year old female with strong family history of premature coronary artery disease who was admitted via emergency room with chest pain which was very typical for angina pectoris.  She was admitted to the hospital and underwent cardiac catheterization the same day.  Cardiac catheterization essentially revealed normal coronary arteries. Hence, it was felt that the chest pain was atypical and was probably related to her GERD.  She was ruled out for myocardial infarction.  Her cardiac markers were negative, myocardial injury.  Her laboratory work in the hospital included a TSH which was normal at 2.0.  CBC was within normal limits with a hemoglobin of 13.5 and hematocrit of 41.1, white count was 7.7 with the platelet count of 263,000.  Her BMP was within normal limits with a BUN of 16, creatinine 0.67.  The patient is advised to follow up with her primary care physician regarding diabetes management.  She will be discharged home on a PPI that is omeprazole 40 mg once a day.  She will also be discharged home on albuterol inhaler.  However, she has also been given medication for hydrocodone, 15 tablets have been given for her right groin pain.  She needs to make an appointment to see Dr. Dorothyann Peng in 2 weeks.  I will also make an appointment for her to be seen in the outpatient basis.     Pamella Pert, MD     JRG/MEDQ  D:  11/22/2010  T:  11/23/2010  Job:   [id number]  cc:   Candyce Churn. Allyne Gee, M.D.  Electronically Signed by Yates Decamp MD on 12/15/2010 01:41:32 PM

## 2010-12-15 NOTE — Cardiovascular Report (Signed)
[name], Anderson                  ACCOUNT NO.:  [id number]  MEDICAL RECORD NO.:  [id number]  LOCATION:  6529                         FACILITY:  [facility]  PHYSICIAN:  Pamella Pert, MD DATE OF BIRTH:  1955-07-15  DATE OF PROCEDURE:  11/21/2010 DATE OF DISCHARGE:                           CARDIAC CATHETERIZATION   PROCEDURE PERFORMED:  Left heart catheterization including: 1. Left ventriculography. 2. Selective right and left coronary arteriography.  INDICATIONS:  Ms. [name] is a 55 year old female who was admitted via emergency department when she was presented with chest discomfort. She has had one set of enzymes that were negative for myocardial injury. EKG essentially was unremarkable.  However, because of persistent chest discomfort and strong family history of premature coronary artery disease, she was brought to the cardiac cath lab to evaluate her coronary anatomy.  HEMODYNAMIC DATA:  The left ventricular pressure was 118/60 with end- diastolic pressure of 22 mmHg.  Aortic pressure was 118/76 with a mean of 95 mmHg, but there is no pressure gradient across the aortic valve.  ANGIOGRAPHIC DATA:  Left ventricle:  Left ventricular systolic function was normal with ejection fraction of 50-55% without any significant wall motion abnormality or mitral regurgitation.  Right coronary artery:  Right coronary artery is smooth and normal and nondominant.  Left main coronary artery:  Left main coronary artery is nonexistent as there is separate ostia of the circumflex and LAD.  Circumflex coronary artery:  Circumflex coronary artery is a very large- caliber vessel and a dominant vessel giving origin to large PDA.  Gives origin to two large obtuse marginals.  Smooth and normal.  LAD:  LAD is a large-caliber vessel.  Gives origin to several small diagonals and one large diagonal 1.  Smooth and normal.  IMPRESSION: 1. Chest pain secondary to esophageal reflux.  I do not  think that     this is unstable angina. 2. Normal coronary arteries. 3. Elevated left ventricular end-diastolic pressure secondary to     diastolic dysfunction, probably related to obesity.  RECOMMENDATIONS:  The patient will be kept overnight as it is 5 o'clock in the evening to achieve hemostasis, and she will be discharged home in the morning if she remains stable.  Continued primary prevention with lifestyle changes, weight loss, and exercise is indicated.  TECHNIQUE OF THE PROCEDURE:  Under sterile precautions using, initially I obtained a 6-French right radial access.  Because of significant spasm at the radial artery near the forearm, I was unable to pass the catheter.  I switched to a 5-French catheter, still unable to pass it. Hence, the catheter was withdrawn and the procedure was abandoned, and I proceeded with obtaining a right femoral arterial access and proceeding with left heart catheterization.  Again, using sterile precautions, a 6-French right femoral arterial access was obtained using Seldinger technique.  A 6-French multipurpose B2 catheter was advanced into the ascending aorta, then into the left ventricle.  Left ventriculography was performed in the RAO projection. Catheter pulled into the ascending aorta.  Right coronary artery, then the left main coronary artery was selectively engaged and angiography was performed.  Catheter then pulled out of the body.  The patient tolerated the procedure well.  No immediate complications.     Pamella Pert, MD     JRG/MEDQ  D:  11/21/2010  T:  11/22/2010  Job:  [id number]  cc:   Candyce Churn. Allyne Gee, M.D.  Electronically Signed by Yates Decamp MD on 12/15/2010 01:41:42 PM

## 2010-12-15 NOTE — H&P (Signed)
NAMECOLLIN, [name]                  ACCOUNT NO.:  [id number]  MEDICAL RECORD NO.:  [id number]  LOCATION:  6529                         FACILITY:  [facility]  PHYSICIAN:  Pamella Pert, MD DATE OF BIRTH:  Jan 25, 1956  DATE OF ADMISSION:  11/21/2010 DATE OF DISCHARGE:                             HISTORY & PHYSICAL   ADMISSION DIAGNOSES: 1. Chest pain, cannot exclude unstable angina.  Atypical chest pain. 2. Strong family history of premature coronary artery disease. 3. Unknown lipid status. 4. Obesity. 5. Bronchial asthma.  HISTORY:  [name] is a 55 year old female with no significant prior cardiovascular history.  She had been doing well until this morning, she was just lying in bed at home when she suddenly felt chest heaviness.  Initially, this lasted for over 5-10 minutes, she felt it was different from her usual GERD.  She took Maalox, but the chest discomfort started to get worse in the form of severe chest pressure with radiating to her neck and also to her back.  This was associated with mild diaphoresis, but otherwise felt well.  It was pretty significant to her.  She called EMS and presented to the emergency room. After 3 sublingual nitroglycerin, the chest pressure did get better, but she has recurrence of chest discomfort again in the last few minutes. She states that now the discomfort is not as bad as she presented to the hospital.  There was no associated shortness of breath, no palpitations, no hemoptysis.  No nausea, vomiting, or diarrhea.  REVIEW OF SYSTEMS:  She denies any bowel or bladder disturbances. Denies any neurological weakness.  Denies recent weight changes.  She is not a diabetic.  He does have bronchial asthma, but has not had any recent exacerbation.  She has not had any recent exacerbation in her GERD.  Other systems are negative.  HOME MEDICATIONS:  Albuterol inhaler MDI on a p.r.n. basis.  She also takes a multivitamin.  ALLERGIES:   No known drug allergies.  FAMILY HISTORY:  Positive for premature coronary artery dizziness in her father who died with myocardial infarction at the age of 71.  Mother died with lung disease.  Has 3 siblings who are younger than her.  No known medical issues from them.  SOCIAL HISTORY:  She is widowed.  She lives by herself.  She has 3 children.  She does not drink alcohol, does not use tobacco products. She does not use any illicit drugs.  PAST MEDICAL HISTORY:  Significant for: 1. 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 2. She has had hiatal hernia repair x2 in the past. 3. She also had hysterectomy about the 3 or 4 years ago.  PHYSICAL EXAMINATION:  GENERAL:  She is moderately built and obese.  She appears to be in no acute distress. VITAL SIGNS:  Temperature of 96.4; pulse is 67 beats per minute, regular; respirations 14, blood pressure 170/76 mmHg. CARDIAC:  S1, S2 was normal without any gallop or murmur. CHEST:  Clear. ABDOMEN:  Soft, nontender, obese without any organomegaly.EXTREMITIES:  Warm, nontender with full range of movements without any edema.  Peripheral arterial  exam was normal.  Her electrolytes were within normal limits.  Blood sugar was elevated at 185.  Her CBC was within normal limits.  Her chest x-ray tilt was within normal limits except for mild pleural thickening.  Her cardiac markers were negative x1.  IMPRESSION: 1. Atypical chest pain, cannot exclude unstable angina in a patient     who has got chest pain with radiation to her neck, jaw, and also to     her back. 2. Strong family history of premature coronary artery disease. 3. Obesity. 4. Bronchial asthma. 5. Unknown lipid status.  RECOMMENDATIONS:  I had discussed with the patient regarding admitting her to the hospital with observation, ruling her out with serial cardiac markers and performing a stress test in the morning.  Also given the option of  proceeding with cardiac catheterization.  After discussing with the patient including risks, benefits, alternatives, the patient decided to proceed with cardiac catheterization.  She understands there is a less than a percent risk of death, stroke, heart attack, urgent need for bypass surgery, bleeding, infection, but not limited to these. I will make further recommendations after cardiac catheterization.     Pamella Pert, MD     JRG/MEDQ  D:  11/21/2010  T:  11/22/2010  Job:  [id number]  cc:   Candyce Churn. Allyne Gee, M.D.  Electronically Signed by Yates Decamp MD on 12/15/2010 01:41:37 PM

## 2011-02-14 LAB — DIFFERENTIAL
Basophils Absolute: 0
Basophils Relative: 0
Eosinophils Absolute: 0.1
Monocytes Absolute: 0.5
Monocytes Relative: 6
Neutro Abs: 4.9

## 2011-02-14 LAB — URINALYSIS, ROUTINE W REFLEX MICROSCOPIC
Hgb urine dipstick: NEGATIVE
Ketones, ur: 15 — AB
Nitrite: NEGATIVE
Protein, ur: 30 — AB
Specific Gravity, Urine: 1.035 — ABNORMAL HIGH
Urobilinogen, UA: 0.2

## 2011-02-14 LAB — CBC
Hemoglobin: 14.5
MCHC: 33.6
MCV: 86.8
RDW: 13.8

## 2011-02-14 LAB — I-STAT 8, (EC8 V) (CONVERTED LAB)
Chloride: 108
Operator id: 285491
Potassium: 3.6
Sodium: 140
TCO2: 29

## 2011-02-14 LAB — POCT I-STAT CREATININE: Creatinine, Ser: 0.8

## 2011-02-14 LAB — URINE MICROSCOPIC-ADD ON

## 2011-02-21 LAB — POCT I-STAT, CHEM 8
BUN: 13
Creatinine, Ser: 0.7
Sodium: 141
TCO2: 25

## 2011-02-21 LAB — CBC
HCT: 40.2
Hemoglobin: 13.6
Platelets: 281
WBC: 7.9

## 2011-02-21 LAB — DIFFERENTIAL
Eosinophils Relative: 2
Lymphocytes Relative: 35
Lymphs Abs: 2.8
Monocytes Absolute: 0.5

## 2011-03-08 LAB — I-STAT 8, (EC8 V) (CONVERTED LAB)
Acid-Base Excess: 2
Chloride: 108
HCT: 43
Hemoglobin: 14.6
Potassium: 3.3 — ABNORMAL LOW
Sodium: 143
pH, Ven: 7.391 — ABNORMAL HIGH

## 2011-03-08 LAB — POCT I-STAT CREATININE: Creatinine, Ser: 0.7

## 2011-04-13 ENCOUNTER — Emergency Department (HOSPITAL_COMMUNITY)
Admission: EM | Admit: 2011-04-13 | Discharge: 2011-04-13 | Disposition: A | Discharge status: Home / Self Care | Payer: BC Managed Care – HMO | Attending: Emergency Medicine | Admitting: Emergency Medicine

## 2011-04-13 ENCOUNTER — Encounter: Payer: Self-pay | Admitting: *Deleted

## 2011-04-13 DIAGNOSIS — R05 Cough: Secondary | ICD-10-CM | POA: Insufficient documentation

## 2011-04-13 DIAGNOSIS — R059 Cough, unspecified: Secondary | ICD-10-CM | POA: Insufficient documentation

## 2011-04-13 DIAGNOSIS — J45909 Unspecified asthma, uncomplicated: Secondary | ICD-10-CM | POA: Insufficient documentation

## 2011-04-13 MED ORDER — ALBUTEROL SULFATE HFA 108 (90 BASE) MCG/ACT IN AERS
2.0000 | INHALATION_SPRAY | Freq: Once | RESPIRATORY_TRACT | Status: AC
  Administered 2011-04-13: 2 via RESPIRATORY_TRACT
  Filled 2011-04-13: qty 6.7

## 2011-04-13 MED ORDER — ALBUTEROL SULFATE (2.5 MG/3ML) 0.083% IN NEBU: 2.5000 mg | INHALATION_SOLUTION | Freq: Four times a day (QID) | RESPIRATORY_TRACT | Status: DC | PRN

## 2011-04-13 MED ORDER — PREDNISONE 20 MG PO TABS: 40.0000 mg | ORAL_TABLET | Freq: Once | ORAL | Status: AC

## 2011-04-13 MED ORDER — ALBUTEROL SULFATE (5 MG/ML) 0.5% IN NEBU
5.0000 mg | INHALATION_SOLUTION | Freq: Once | RESPIRATORY_TRACT | Status: AC
  Administered 2011-04-13: 5 mg via RESPIRATORY_TRACT
  Filled 2011-04-13: qty 1

## 2011-04-13 MED ORDER — PREDNISONE 20 MG PO TABS
40.0000 mg | ORAL_TABLET | Freq: Once | ORAL | Status: AC
  Administered 2011-04-13: 40 mg via ORAL
  Filled 2011-04-13: qty 2

## 2011-04-13 NOTE — ED Provider Notes (Signed)
Medical screening examination/treatment/procedure(s) were performed by non-physician practitioner and as supervising physician I was immediately available for consultation/collaboration.   Dayton Bailiff, MD 04/13/11 1536

## 2011-04-13 NOTE — ED Notes (Signed)
Reports non prod cough since yesterday. Also reports chills & chest tightness "all over especially when I take a deep breath in".

## 2011-04-13 NOTE — ED Notes (Signed)
C/o cold, cough yesterday. SOB, "asthma acting up last night". Reports worse this am

## 2011-04-13 NOTE — ED Provider Notes (Signed)
History     CSN: [id number] Arrival date & time: 04/13/2011  8:16 AM   First MD Initiated Contact with Patient 04/13/11 0825      Chief Complaint  Patient presents with  . Asthma    (Consider location/radiation/quality/duration/timing/severity/associated sxs/prior treatment) HPI Comments: Patient here after URI symptoms which started 2 days ago - she states she has tried her nebulizer treatments at home but that she thinks the medication is OLD and did not help.  Patient is a 55 y.o. female presenting with asthma. The history is provided by the patient. No language interpreter was used.  Asthma This is a new problem. The current episode started today. The problem occurs constantly. The problem has been gradually worsening. Associated symptoms include coughing. Pertinent negatives include no abdominal pain, chest pain, chills, congestion, headaches, nausea, rash, sore throat, vomiting or weakness. The symptoms are aggravated by nothing. She has tried nothing for the symptoms.    Past Medical History  Diagnosis Date  . Asthma     Past Surgical History  Procedure Date  . Hernia repair   . Replacement total knee   . Abdominal hysterectomy   . Cholecystectomy     No family history on file.  History  Substance Use Topics  . Smoking status: Never Smoker   . Smokeless tobacco: Not on file  . Alcohol Use: No    OB History    Grav Para Term Preterm Abortions TAB SAB Ect Mult Living                  Review of Systems  Constitutional: Negative for chills.  HENT: Negative for congestion and sore throat.   Respiratory: Positive for cough.   Cardiovascular: Negative for chest pain.  Gastrointestinal: Negative for nausea, vomiting and abdominal pain.  Skin: Negative for rash.  Neurological: Negative for weakness and headaches.  All other systems reviewed and are negative.    Allergies  Review of patient's allergies indicates no known allergies.  Home Medications    Current Outpatient Rx  Name Route Sig Dispense Refill  . ALBUTEROL SULFATE (2.5 MG/3ML) 0.083% IN NEBU Nebulization Take 2.5 mg by nebulization every 6 (six) hours as needed.        BP 116/73  Pulse 60  Temp(Src) 97.8 F (36.6 C) (Oral)  Resp 16  SpO2 98%  Physical Exam  Nursing note and vitals reviewed. Constitutional: She is oriented to person, place, and time. She appears well-developed and well-nourished.  HENT:  Head: Normocephalic and atraumatic.  Right Ear: External ear normal.  Mouth/Throat: Oropharynx is clear and moist.  Eyes: Pupils are equal, round, and reactive to light.  Neck: Normal range of motion. Neck supple.  Cardiovascular: Normal rate, regular rhythm and normal heart sounds.   Pulmonary/Chest: Effort normal. She has wheezes in the right upper field and the left upper field.  Abdominal: Soft. Bowel sounds are normal.  Musculoskeletal: Normal range of motion.  Neurological: She is alert and oriented to person, place, and time.  Skin: Skin is warm and dry.  Psychiatric: She has a normal mood and affect. Her behavior is normal. Judgment and thought content normal.    ED Course  Procedures (including critical care time)  Labs Reviewed - No data to display No results found.   Asthma exacerbation   MDM  Plan to give nebilizer and start on steroids here for acute asthma exacerbation      Reports improvement in symptoms after nebulizer lungs CTA bilaterally - plan  on rx with same.  Izola Price [place], [place] 04/13/11 1019

## 2011-08-25 ENCOUNTER — Emergency Department (HOSPITAL_COMMUNITY)
Admission: EM | Admit: 2011-08-25 | Discharge: 2011-08-25 | Disposition: A | Discharge status: Home / Self Care | Payer: BC Managed Care – HMO | Attending: Emergency Medicine | Admitting: Emergency Medicine

## 2011-08-25 ENCOUNTER — Emergency Department (HOSPITAL_COMMUNITY): Payer: BC Managed Care – HMO

## 2011-08-25 ENCOUNTER — Other Ambulatory Visit: Payer: Self-pay

## 2011-08-25 ENCOUNTER — Encounter (HOSPITAL_COMMUNITY): Payer: Self-pay

## 2011-08-25 DIAGNOSIS — N201 Calculus of ureter: Secondary | ICD-10-CM

## 2011-08-25 DIAGNOSIS — R5383 Other fatigue: Secondary | ICD-10-CM | POA: Insufficient documentation

## 2011-08-25 DIAGNOSIS — J45909 Unspecified asthma, uncomplicated: Secondary | ICD-10-CM | POA: Insufficient documentation

## 2011-08-25 DIAGNOSIS — R51 Headache: Secondary | ICD-10-CM | POA: Insufficient documentation

## 2011-08-25 DIAGNOSIS — R5381 Other malaise: Secondary | ICD-10-CM | POA: Insufficient documentation

## 2011-08-25 LAB — URINALYSIS, ROUTINE W REFLEX MICROSCOPIC
Protein, ur: NEGATIVE mg/dL
Urobilinogen, UA: 1 mg/dL (ref 0.0–1.0)

## 2011-08-25 LAB — URINE MICROSCOPIC-ADD ON

## 2011-08-25 MED ORDER — TAMSULOSIN HCL 0.4 MG PO CAPS: 0.4000 mg | ORAL_CAPSULE | Freq: Every day | ORAL | Status: DC

## 2011-08-25 MED ORDER — OXYCODONE-ACETAMINOPHEN 5-325 MG PO TABS: 1.0000 | ORAL_TABLET | ORAL | Status: AC | PRN

## 2011-08-25 NOTE — ED Notes (Signed)
Hematuria for 3 days.  

## 2011-08-25 NOTE — ED Provider Notes (Signed)
History     CSN: [id number]  Arrival date & time 08/25/11  0800   First MD Initiated Contact with Patient 08/25/11 0820      Chief Complaint  Patient presents with  . Hematuria    (Consider location/radiation/quality/duration/timing/severity/associated sxs/prior treatment) Patient is a 56 y.o. female presenting with hematuria. The history is provided by the patient. No language interpreter was used.  Hematuria This is a new problem. Episode onset: Three days. The problem is unchanged. She describes the hematuria as gross hematuria. The hematuria occurs throughout her entire urinary stream. She reports no clotting in her urine stream. Her pain is at a severity of 0/10. She is experiencing no pain. She describes her urine color as light pink. None. None. (Headache, malaise ) Her past medical history is significant for kidney stones.    Past Medical History  Diagnosis Date  . Asthma   . Kidney stones     Past Surgical History  Procedure Date  . Hernia repair   . Replacement total knee   . Abdominal hysterectomy   . Cholecystectomy     No family history on file.  History  Substance Use Topics  . Smoking status: Never Smoker   . Smokeless tobacco: Not on file  . Alcohol Use: No    OB History    Grav Para Term Preterm Abortions TAB SAB Ect Mult Living                  Review of Systems  Constitutional: Negative for fatigue.       Malaise, no fever.    Genitourinary: Positive for hematuria.  Musculoskeletal: Negative.   Neurological: Positive for headaches.  Psychiatric/Behavioral: Negative.     Allergies  Review of patient's allergies indicates no known allergies.  Home Medications   Current Outpatient Rx  Name Route Sig Dispense Refill  . ALBUTEROL SULFATE (2.5 MG/3ML) 0.083% IN NEBU Nebulization Take 2.5 mg by nebulization every 6 (six) hours as needed. For wheezing      BP 109/57  Pulse 62  Temp(Src) 98.1 F (36.7 C) (Oral)  Resp 18  SpO2  98%  Physical Exam  Constitutional: She is oriented to person, place, and time. She appears well-developed and well-nourished. No distress.  HENT:  Head: Normocephalic and atraumatic.  Right Ear: External ear normal.  Left Ear: External ear normal.  Mouth/Throat: Oropharynx is clear and moist.       She localizes her headache to her temples.  No palpable deformity.  Eyes: Conjunctivae and EOM are normal. Pupils are equal, round, and reactive to light. No scleral icterus.  Neck: Normal range of motion. Neck supple.  Cardiovascular: Normal rate, regular rhythm and normal heart sounds.   Pulmonary/Chest: Effort normal and breath sounds normal.  Abdominal: Soft. Bowel sounds are normal.  Musculoskeletal:       No bony or CVA tenderness.   Neurological: She is alert and oriented to person, place, and time. She has normal reflexes.       No sensory or motor deficit.  Skin: Skin is warm and dry.  Psychiatric: She has a normal mood and affect. Her behavior is normal.    ED Course  Procedures (including critical care time)   Labs Reviewed  URINALYSIS, ROUTINE W REFLEX MICROSCOPIC   8:20 AM  Date: 08/25/2011  Rate: 70  Rhythm: normal sinus rhythm  QRS Axis: normal  Intervals: normal  ST/T Wave abnormalities: normal  Conduction Disutrbances:none  Narrative Interpretation: Norma EKG  Old EKG Reviewed: none available   9:05 AM  Pt seen --> physical exam performed.  UA, urine C&S ordered.  9:59 AM UA shows hematuria, few WBC's.  CT of abdomen/pelvis without contrast ordered to check for kidney stones.    11:40 AM Results for orders placed during the hospital encounter of 08/25/11  URINALYSIS, ROUTINE W REFLEX MICROSCOPIC      Component Value Range   Color, Urine YELLOW  YELLOW    APPearance TURBID (*) CLEAR    Specific Gravity, Urine 1.017  1.005 - 1.030    pH 7.0  5.0 - 8.0    Glucose, UA NEGATIVE  NEGATIVE (mg/dL)   Hgb urine dipstick LARGE (*) NEGATIVE    Bilirubin  Urine NEGATIVE  NEGATIVE    Ketones, ur NEGATIVE  NEGATIVE (mg/dL)   Protein, ur NEGATIVE  NEGATIVE (mg/dL)   Urobilinogen, UA 1.0  0.0 - 1.0 (mg/dL)   Nitrite NEGATIVE  NEGATIVE    Leukocytes, UA SMALL (*) NEGATIVE   URINE MICROSCOPIC-ADD ON      Component Value Range   Squamous Epithelial / LPF FEW (*) RARE    WBC, UA 0-2  <3 (WBC/hpf)   RBC / HPF 21-50  <3 (RBC/hpf)   Bacteria, UA FEW (*) RARE    Ct Abdomen Pelvis Wo Contrast  08/25/2011  *RADIOLOGY REPORT*  Clinical Data: Hematuria for the past 3 days.  History of kidney stones.  CT ABDOMEN AND PELVIS WITHOUT CONTRAST  Technique:  Multidetector CT imaging of the abdomen and pelvis was performed following the standard protocol without intravenous contrast.  Comparison: CT of abdomen and pelvis without contrast 12/27/2009.  Findings:  Lung Bases: Small amount of scarring in the inferior segment of the lingula.  Postoperative changes adjacent to the gastroesophageal junction suggesting repair for prior hiatal hernia.  Mild thickening of the distal esophagus.  Abdomen/Pelvis:  Image #53 of series 2 demonstrates a 3 mm calcification within the mid portion of the left ureter that may represent a nonobstructive calculus.  No signs of proximal hydroureteronephrosis or perinephric stranding to suggest obstruction at this time.  In the right renal collecting system (image 27 of series 2) there is a faint 1-2 millimeter calcification that may represent an additional nonobstructive calculus.  No calculi are noted along the course of the right ureter, and no calculi are noted within the lumen of the urinary bladder.  The patient is status post cholecystectomy.  The unenhanced appearance of the liver, pancreas, spleen and bilateral adrenal glands is unremarkable.  There is no ascites or pneumoperitoneum and no pathologic distension of bowel.  The appendix is normal. Numerous phleboliths are noted within the pelvis.  The patient is status post total abdominal  hysterectomy and bilateral salpingo- oophorectomy.  No definite pathologic adenopathy identified within the abdomen or pelvis.  Musculoskeletal: A well-defined sclerotic lesion in the right ilium, similar to prior study from 10/27/2009, most consistent with a bone island. There are no aggressive appearing lytic or blastic lesions noted in the visualized portions of the skeleton.  IMPRESSION: 1.  3 mm nonobstructive calculus within the mid left ureter.  There is also a 1-2 mm nonobstructive calculus in the right renal collecting system.  No signs of urinary tract obstruction at this time. 2.  Postoperative changes, as above. 3.  Mild thickening of the distal esophagus may suggest changes of reflux esophagitis.  Original Report Authenticated By: Florencia Reasons, M.D.    CT showed a 3 mm nonobstructive stone in  the left mid ureter.  Rx tamsulosin to facilitate stone passage, Percocet if needed for pain, Strain urine to catch the stone, F/U with Dr. Margarita Grizzle, urologist on call.    1. Left ureteral calculus           Carleene Cooper III, MD 08/25/11 1144

## 2011-08-25 NOTE — ED Notes (Signed)
PLEASE DISREGARD NOTE TIMED 4098; CHARTED ON WRONG PT

## 2011-08-25 NOTE — Discharge Instructions (Signed)

## 2011-08-26 LAB — URINE CULTURE: Culture: NO GROWTH

## 2011-11-26 ENCOUNTER — Other Ambulatory Visit (HOSPITAL_COMMUNITY): Payer: Self-pay | Admitting: Orthopedic Surgery

## 2011-11-26 DIAGNOSIS — M25561 Pain in right knee: Secondary | ICD-10-CM

## 2011-12-04 ENCOUNTER — Emergency Department (HOSPITAL_COMMUNITY): Payer: BC Managed Care – HMO

## 2011-12-04 ENCOUNTER — Encounter (HOSPITAL_COMMUNITY): Payer: Self-pay | Admitting: Emergency Medicine

## 2011-12-04 ENCOUNTER — Emergency Department (HOSPITAL_COMMUNITY)
Admission: EM | Admit: 2011-12-04 | Discharge: 2011-12-04 | Disposition: A | Discharge status: Home / Self Care | Payer: BC Managed Care – HMO | Attending: Emergency Medicine | Admitting: Emergency Medicine

## 2011-12-04 DIAGNOSIS — Z9089 Acquired absence of other organs: Secondary | ICD-10-CM | POA: Insufficient documentation

## 2011-12-04 DIAGNOSIS — R51 Headache: Secondary | ICD-10-CM | POA: Insufficient documentation

## 2011-12-04 DIAGNOSIS — E119 Type 2 diabetes mellitus without complications: Secondary | ICD-10-CM | POA: Insufficient documentation

## 2011-12-04 DIAGNOSIS — R42 Dizziness and giddiness: Secondary | ICD-10-CM | POA: Insufficient documentation

## 2011-12-04 DIAGNOSIS — Z96659 Presence of unspecified artificial knee joint: Secondary | ICD-10-CM | POA: Insufficient documentation

## 2011-12-04 DIAGNOSIS — J45909 Unspecified asthma, uncomplicated: Secondary | ICD-10-CM | POA: Insufficient documentation

## 2011-12-04 LAB — BASIC METABOLIC PANEL
GFR calc Af Amer: 85 mL/min — ABNORMAL LOW (ref >90)
GFR calc non Af Amer: 74 mL/min — ABNORMAL LOW (ref >90)
Potassium: 3.7 mEq/L (ref 3.5–5.1)
Sodium: 143 mEq/L (ref 135–145)

## 2011-12-04 LAB — CBC WITH DIFFERENTIAL/PLATELET
Basophils Absolute: 0 10*3/uL (ref 0.0–0.1)
Basophils Relative: 0 % (ref 0–1)
Eosinophils Absolute: 0 10*3/uL (ref 0.0–0.7)
MCH: 28.9 pg (ref 26.0–34.0)
MCHC: 31.4 g/dL (ref 30.0–36.0)
Neutrophils Relative %: 73 % (ref 43–77)
Platelets: 215 10*3/uL (ref 150–400)
RBC: 4.29 MIL/uL (ref 3.87–5.11)

## 2011-12-04 MED ORDER — ONDANSETRON HCL 4 MG/2ML IJ SOLN
4.0000 mg | Freq: Once | INTRAMUSCULAR | Status: AC
  Administered 2011-12-04: 4 mg via INTRAVENOUS
  Filled 2011-12-04: qty 2

## 2011-12-04 MED ORDER — DEXAMETHASONE SODIUM PHOSPHATE 4 MG/ML IJ SOLN
10.0000 mg | Freq: Once | INTRAMUSCULAR | Status: AC
  Administered 2011-12-04: 10 mg via INTRAVENOUS
  Filled 2011-12-04: qty 1
  Filled 2011-12-04: qty 2

## 2011-12-04 MED ORDER — METOCLOPRAMIDE HCL 5 MG/ML IJ SOLN
10.0000 mg | Freq: Once | INTRAMUSCULAR | Status: AC
  Administered 2011-12-04: 10 mg via INTRAVENOUS
  Filled 2011-12-04: qty 2

## 2011-12-04 MED ORDER — SODIUM CHLORIDE 0.9 % IV SOLN
Freq: Once | INTRAVENOUS | Status: AC
  Administered 2011-12-04: 09:00:00 via INTRAVENOUS

## 2011-12-04 MED ORDER — LORAZEPAM 2 MG/ML IJ SOLN
1.0000 mg | Freq: Once | INTRAMUSCULAR | Status: AC
  Administered 2011-12-04: 1 mg via INTRAVENOUS
  Filled 2011-12-04: qty 1

## 2011-12-04 MED ORDER — MORPHINE SULFATE 2 MG/ML IJ SOLN
2.0000 mg | Freq: Once | INTRAMUSCULAR | Status: AC
  Administered 2011-12-04: 2 mg via INTRAVENOUS
  Filled 2011-12-04: qty 1

## 2011-12-04 MED ORDER — DIPHENHYDRAMINE HCL 50 MG/ML IJ SOLN
25.0000 mg | Freq: Once | INTRAMUSCULAR | Status: AC
  Administered 2011-12-04: 13:00:00 via INTRAVENOUS
  Filled 2011-12-04: qty 1

## 2011-12-04 NOTE — ED Notes (Signed)
Patient claims started having dizziness, headaches and nausea x 2 days ago.  Patient states that she cannot vomit.  Patient claims that she has the headaches in bilateral temporal regions.   Patient claims that her headaches range about 7/10 - 8/10 when she has them.

## 2011-12-04 NOTE — ED Notes (Signed)
Vitals performed; pt undressed, in gown, on continuous pulse oximetry and blood pressure cuff 

## 2011-12-04 NOTE — ED Notes (Signed)
Pt has returned from being out of the department; placed back on continuous pulse oximetry and blood pressure cuff 

## 2011-12-04 NOTE — ED Provider Notes (Signed)
History     CSN: [id number]  Arrival date & time 12/04/11  4098   First MD Initiated Contact with Patient 12/04/11 [phone]      Chief Complaint  Patient presents with  . Dizziness    (Consider location/radiation/quality/duration/timing/severity/associated sxs/prior treatment) The history is provided by the patient.   patient here with dizziness that started 2 days ago associated now with frontal headache with nausea but no vomiting. Notes that the room is spinning. Symptoms are made worse with certain head movements and better with nothing. No prior history of same. Note some ataxia when she walks. Patient also states she has questionable right-sided weakness. Headache is sharp and constant. Slight neck pain associated with this which is non-positional. Denies any fever or photophobia.  Past Medical History  Diagnosis Date  . Asthma   . Kidney stones   . Diabetes mellitus     Past Surgical History  Procedure Date  . Hernia repair   . Replacement total knee   . Abdominal hysterectomy   . Cholecystectomy     History reviewed. No pertinent family history.  History  Substance Use Topics  . Smoking status: Never Smoker   . Smokeless tobacco: Not on file  . Alcohol Use: No    OB History    Grav Para Term Preterm Abortions TAB SAB Ect Mult Living                  Review of Systems  All other systems reviewed and are negative.    Allergies  Review of patient's allergies indicates no known allergies.  Home Medications   Current Outpatient Rx  Name Route Sig Dispense Refill  . ALBUTEROL SULFATE (2.5 MG/3ML) 0.083% IN NEBU Nebulization Take 2.5 mg by nebulization every 6 (six) hours as needed. For wheezing    . TAMSULOSIN HCL 0.4 MG PO CAPS Oral Take 1 capsule (0.4 mg total) by mouth daily after breakfast. 5 capsule 0    One tablet once a day to facilitate stone passage.    BP 121/61  Pulse 84  Temp 98.3 F (36.8 C) (Oral)  Resp 16  Ht 5\' 3"  (1.6 m)  Wt 183 lb  (83.008 kg)  BMI 32.42 kg/m2  SpO2 97%  Physical Exam  Nursing note and vitals reviewed. Constitutional: She is oriented to person, place, and time. Vital signs are normal. She appears well-developed and well-nourished.  Non-toxic appearance. No distress.  HENT:  Head: Normocephalic and atraumatic.  Eyes: Conjunctivae, EOM and lids are normal. Pupils are equal, round, and reactive to light.  Neck: Normal range of motion. Neck supple. No tracheal deviation present. No mass present.  Cardiovascular: Normal rate, regular rhythm and normal heart sounds.  Exam reveals no gallop.   No murmur heard. Pulmonary/Chest: Effort normal and breath sounds normal. No stridor. No respiratory distress. She has no decreased breath sounds. She has no wheezes. She has no rhonchi. She has no rales.  Abdominal: Soft. Normal appearance and bowel sounds are normal. She exhibits no distension. There is no tenderness. There is no rebound and no CVA tenderness.  Musculoskeletal: Normal range of motion. She exhibits no edema and no tenderness.  Neurological: She is alert and oriented to person, place, and time. She has normal strength. No cranial nerve deficit or sensory deficit. GCS eye subscore is 4. GCS verbal subscore is 5. GCS motor subscore is 6.  Skin: Skin is warm and dry. No abrasion and no rash noted.  Psychiatric: She has a  normal mood and affect. Her speech is normal and behavior is normal.    ED Course  Procedures (including critical care time)  Labs Reviewed - No data to display No results found.   No diagnosis found.    MDM  1:08 PM Patient's MRI negative for stroke. Suspect that patient does have migraine headache. Repeat neurological exam x2 remained stable. Will give migraine  Cocktail  2:31 PM Patient rechecked after resting a dose much better. Headache is gone. Repeat neurological assessment remains normal. No concern for subarachnoid hemorrhage.         Toy Baker,  MD 12/04/11 [phone]

## 2011-12-05 ENCOUNTER — Other Ambulatory Visit (HOSPITAL_COMMUNITY): Payer: Self-pay

## 2011-12-05 ENCOUNTER — Encounter (HOSPITAL_COMMUNITY): Payer: Self-pay

## 2011-12-09 ENCOUNTER — Encounter (HOSPITAL_COMMUNITY)
Admission: RE | Admit: 2011-12-09 | Discharge: 2011-12-09 | Disposition: A | Discharge status: Home / Self Care | Payer: BC Managed Care – HMO | Source: Ambulatory Visit | Attending: Orthopedic Surgery | Admitting: Orthopedic Surgery

## 2011-12-09 DIAGNOSIS — M25569 Pain in unspecified knee: Secondary | ICD-10-CM | POA: Insufficient documentation

## 2011-12-09 DIAGNOSIS — M25561 Pain in right knee: Secondary | ICD-10-CM

## 2011-12-09 MED ORDER — TECHNETIUM TC 99M MEDRONATE IV KIT
25.0000 | PACK | Freq: Once | INTRAVENOUS | Status: AC | PRN
  Administered 2011-12-09: 25.8 via INTRAVENOUS

## 2012-01-13 ENCOUNTER — Emergency Department (INDEPENDENT_AMBULATORY_CARE_PROVIDER_SITE_OTHER): Payer: BC Managed Care – HMO

## 2012-01-13 ENCOUNTER — Emergency Department (HOSPITAL_COMMUNITY)
Admission: EM | Admit: 2012-01-13 | Discharge: 2012-01-13 | Disposition: A | Discharge status: Home / Self Care | Payer: BC Managed Care – HMO | Source: Home / Self Care

## 2012-01-13 ENCOUNTER — Encounter (HOSPITAL_COMMUNITY): Payer: Self-pay

## 2012-01-13 DIAGNOSIS — B029 Zoster without complications: Secondary | ICD-10-CM

## 2012-01-13 MED ORDER — HYDROCODONE-ACETAMINOPHEN 5-500 MG PO TABS: 1.0000 | ORAL_TABLET | Freq: Four times a day (QID) | ORAL | Status: AC | PRN

## 2012-01-13 MED ORDER — GABAPENTIN 100 MG PO CAPS: 100.0000 mg | ORAL_CAPSULE | Freq: Every day | ORAL | Status: DC

## 2012-01-13 MED ORDER — ACYCLOVIR 400 MG PO TABS: 800.0000 mg | ORAL_TABLET | Freq: Every day | ORAL | Status: AC

## 2012-01-13 NOTE — ED Provider Notes (Signed)
History     CSN: [id number]  Arrival date & time 01/13/12  1810   First MD Initiated Contact with Patient 01/13/12 1830      Chief Complaint  Patient presents with  . Pain    56 yr old female presents to [facility] with burning andf painful L lower back which started about 2 days ago.  She states that this has oprogressed over the past 2 days and is a burning quality.  She has no rash over the area, .  The pain comes form the Mid-back to and wraps around the abdomen.  Made better by tylenol (barely) She states she wa son the verge of going to the ED..  It is tender to touch.  No falls or trauma, no accidents. She states that this [pain is about a 8/10.       HPI  Past Medical History  Diagnosis Date  . Asthma   . Kidney stones   . Diabetes mellitus     Past Surgical History  Procedure Date  . Hernia repair   . Replacement total knee   . Abdominal hysterectomy   . Cholecystectomy     No family history on file.  History  Substance Use Topics  . Smoking status: Never Smoker   . Smokeless tobacco: Not on file  . Alcohol Use: No    OB History    Grav Para Term Preterm Abortions TAB SAB Ect Mult Living                  Review of Systems Alert, EOMI CTA B, NO TVR/TVF Point tenderness to the L lower chest. With wrapping around the L flank-no rash, however a scar NO rebound no gaurding, BSW+   Allergies  Review of patient's allergies indicates no known allergies.  Home Medications   Current Outpatient Rx  Name Route Sig Dispense Refill  . ALBUTEROL SULFATE (2.5 MG/3ML) 0.083% IN NEBU Nebulization Take 2.5 mg by nebulization every 6 (six) hours as needed. For wheezing    . TAMSULOSIN HCL 0.4 MG PO CAPS Oral Take 1 capsule (0.4 mg total) by mouth daily after breakfast. 5 capsule 0    One tablet once a day to facilitate stone passage.    BP 133/76  Pulse 76  Temp 97.9 F (36.6 C) (Oral)  Resp 16  SpO2 97%  Physical Exam Alert Obese CF in NAD CAT B s1 S2 no  m/r/g NO point tenderness to L ribs areas-small punctate 5mm rash over L Flank No step-off some loss of lumbar lordosis.   ED Course  Procedures (including critical care time)  Labs Reviewed - No data to display No results found.   No diagnosis found.    MDM  Chest x-rays done show no rib fracture vs. acute lung pathology-the sound suspiciously like early shingles which I will treat empirically and aggressively with gabapentin and acyclovir 800 mg 5 times day for 7 days.  Next I will start her on gabapentin 100 mg each bedtime for pain and give her a limited prescription of Vicodin 5/500 (20) She is to present her regular physician for further workup and management of the same in the next week  She understands this plan as described above        Rhetta Mura, MD 01/13/12 1954

## 2012-01-13 NOTE — ED Notes (Signed)
C/o pain in lt mid back that radiates around her lt torso.  Describes as burning pain, skin is tender to touch and deep pain that kept her awake last night.  Sx for 2 days.

## 2012-02-05 ENCOUNTER — Other Ambulatory Visit (HOSPITAL_COMMUNITY): Payer: Self-pay | Admitting: Orthopedic Surgery

## 2012-02-19 ENCOUNTER — Encounter (HOSPITAL_COMMUNITY): Payer: Self-pay | Admitting: Pharmacist

## 2012-02-20 ENCOUNTER — Encounter (HOSPITAL_COMMUNITY): Payer: Self-pay

## 2012-02-20 ENCOUNTER — Ambulatory Visit (HOSPITAL_COMMUNITY)
Admission: RE | Admit: 2012-02-20 | Discharge: 2012-02-20 | Disposition: A | Discharge status: Home / Self Care | Payer: BC Managed Care – HMO | Source: Ambulatory Visit | Attending: Orthopedic Surgery | Admitting: Orthopedic Surgery

## 2012-02-20 ENCOUNTER — Other Ambulatory Visit (HOSPITAL_COMMUNITY): Payer: Self-pay

## 2012-02-20 ENCOUNTER — Encounter (HOSPITAL_COMMUNITY)
Admission: RE | Admit: 2012-02-20 | Discharge: 2012-02-20 | Disposition: A | Discharge status: Home / Self Care | Payer: BC Managed Care – HMO | Source: Ambulatory Visit | Attending: Orthopedic Surgery | Admitting: Orthopedic Surgery

## 2012-02-20 DIAGNOSIS — Z01812 Encounter for preprocedural laboratory examination: Secondary | ICD-10-CM | POA: Insufficient documentation

## 2012-02-20 DIAGNOSIS — J45909 Unspecified asthma, uncomplicated: Secondary | ICD-10-CM | POA: Insufficient documentation

## 2012-02-20 DIAGNOSIS — Z01818 Encounter for other preprocedural examination: Secondary | ICD-10-CM | POA: Insufficient documentation

## 2012-02-20 DIAGNOSIS — E119 Type 2 diabetes mellitus without complications: Secondary | ICD-10-CM | POA: Insufficient documentation

## 2012-02-20 HISTORY — DX: Unspecified osteoarthritis, unspecified site: M19.90

## 2012-02-20 HISTORY — DX: Acute myocardial infarction, unspecified: I21.9

## 2012-02-20 LAB — URINALYSIS, ROUTINE W REFLEX MICROSCOPIC
Bilirubin Urine: NEGATIVE
Hgb urine dipstick: NEGATIVE
Ketones, ur: NEGATIVE mg/dL
Nitrite: NEGATIVE
Urobilinogen, UA: 0.2 mg/dL (ref 0.0–1.0)
pH: 6 (ref 5.0–8.0)

## 2012-02-20 LAB — BASIC METABOLIC PANEL
Calcium: 10.1 mg/dL (ref 8.4–10.5)
Creatinine, Ser: 0.71 mg/dL (ref 0.50–1.10)
GFR calc Af Amer: >90 mL/min (ref >90)

## 2012-02-20 LAB — CBC
MCHC: 32.5 g/dL (ref 30.0–36.0)
Platelets: 240 10*3/uL (ref 150–400)
RDW: 13.6 % (ref 11.5–15.5)
WBC: 8.7 10*3/uL (ref 4.0–10.5)

## 2012-02-20 LAB — TYPE AND SCREEN: Antibody Screen: NEGATIVE

## 2012-02-20 LAB — PROTIME-INR: INR: 0.95 (ref 0.00–1.49)

## 2012-02-20 LAB — SURGICAL PCR SCREEN: MRSA, PCR: NEGATIVE

## 2012-02-20 NOTE — Pre-Procedure Instructions (Signed)
20 [name]  02/20/2012   Your procedure is scheduled on: Tuesday 02/25/12   Report to [facility] at 530 AM.  Call this number if you have problems the morning of surgery: [phone]   Remember:   Do not eat food or drink :After Midnight    Take these medicines the morning of surgery with A SIP OF WATER:  Albuterol, neurontin, percocet if needed, flomax   Do not wear jewelry, make-up or nail polish.  Do not wear lotions, powders, or perfumes. You may wear deodorant.  Do not shave 48 hours prior to surgery. Men may shave face and neck.  Do not bring valuables to the hospital.  Contacts, dentures or bridgework may not be worn into surgery.  Leave suitcase in the car. After surgery it may be brought to your room.  For patients admitted to the hospital, checkout time is 11:00 AM the day of discharge.   Patients discharged the day of surgery will not be allowed to drive home.  Name and phone number of your driver:   Special Instructions: Shower using CHG 2 nights before surgery and the night before surgery.  If you shower the day of surgery use CHG.  Use special wash - you have one bottle of CHG for all showers.  You should use approximately 1/3 of the bottle for each shower.   Please read over the following fact sheets that you were given: Pain Booklet, Coughing and Deep Breathing, Blood Transfusion Information, Total Joint Packet, MRSA Information and Surgical Site Infection Prevention

## 2012-02-21 LAB — URINE CULTURE: Culture: NO GROWTH

## 2012-02-24 MED ORDER — CEFAZOLIN SODIUM-DEXTROSE 2-3 GM-% IV SOLR
2.0000 g | INTRAVENOUS | Status: AC
  Administered 2012-02-25: 2 g via INTRAVENOUS
  Filled 2012-02-24: qty 50

## 2012-02-25 ENCOUNTER — Encounter (HOSPITAL_COMMUNITY): Payer: Self-pay | Admitting: Certified Registered"

## 2012-02-25 ENCOUNTER — Encounter (HOSPITAL_COMMUNITY)
Admission: RE | Disposition: A | Discharge status: Home / Self Care | Payer: Self-pay | Source: Ambulatory Visit | Attending: Orthopedic Surgery

## 2012-02-25 ENCOUNTER — Encounter (HOSPITAL_COMMUNITY): Payer: Self-pay | Admitting: *Deleted

## 2012-02-25 ENCOUNTER — Inpatient Hospital Stay (HOSPITAL_COMMUNITY): Payer: BC Managed Care – PPO

## 2012-02-25 ENCOUNTER — Ambulatory Visit (HOSPITAL_COMMUNITY): Payer: BC Managed Care – PPO | Admitting: Certified Registered"

## 2012-02-25 DIAGNOSIS — K219 Gastro-esophageal reflux disease without esophagitis: Secondary | ICD-10-CM | POA: Diagnosis present

## 2012-02-25 DIAGNOSIS — R209 Unspecified disturbances of skin sensation: Secondary | ICD-10-CM | POA: Diagnosis not present

## 2012-02-25 DIAGNOSIS — I252 Old myocardial infarction: Secondary | ICD-10-CM

## 2012-02-25 DIAGNOSIS — Y831 Surgical operation with implant of artificial internal device as the cause of abnormal reaction of the patient, or of later complication, without mention of misadventure at the time of the procedure: Secondary | ICD-10-CM | POA: Diagnosis present

## 2012-02-25 DIAGNOSIS — Z8711 Personal history of peptic ulcer disease: Secondary | ICD-10-CM

## 2012-02-25 DIAGNOSIS — K259 Gastric ulcer, unspecified as acute or chronic, without hemorrhage or perforation: Secondary | ICD-10-CM | POA: Diagnosis present

## 2012-02-25 DIAGNOSIS — N289 Disorder of kidney and ureter, unspecified: Secondary | ICD-10-CM | POA: Diagnosis present

## 2012-02-25 DIAGNOSIS — T84039A Mechanical loosening of unspecified internal prosthetic joint, initial encounter: Principal | ICD-10-CM | POA: Diagnosis present

## 2012-02-25 DIAGNOSIS — Z96659 Presence of unspecified artificial knee joint: Secondary | ICD-10-CM

## 2012-02-25 DIAGNOSIS — Z23 Encounter for immunization: Secondary | ICD-10-CM

## 2012-02-25 DIAGNOSIS — M1710 Unilateral primary osteoarthritis, unspecified knee: Secondary | ICD-10-CM

## 2012-02-25 DIAGNOSIS — Y92009 Unspecified place in unspecified non-institutional (private) residence as the place of occurrence of the external cause: Secondary | ICD-10-CM

## 2012-02-25 DIAGNOSIS — E119 Type 2 diabetes mellitus without complications: Secondary | ICD-10-CM | POA: Diagnosis present

## 2012-02-25 DIAGNOSIS — J45909 Unspecified asthma, uncomplicated: Secondary | ICD-10-CM | POA: Diagnosis present

## 2012-02-25 HISTORY — PX: TOTAL KNEE REVISION: SHX996

## 2012-02-25 LAB — GLUCOSE, CAPILLARY: Glucose-Capillary: 152 mg/dL — ABNORMAL HIGH (ref 70–99)

## 2012-02-25 SURGERY — TOTAL KNEE REVISION
Anesthesia: General | Site: Knee | Laterality: Right | Wound class: Clean

## 2012-02-25 MED ORDER — GLYCOPYRROLATE 0.2 MG/ML IJ SOLN
INTRAMUSCULAR | Status: DC | PRN
  Administered 2012-02-25: 0.4 mg via INTRAVENOUS

## 2012-02-25 MED ORDER — ONDANSETRON HCL 4 MG/2ML IJ SOLN: 4.0000 mg | Freq: Four times a day (QID) | INTRAMUSCULAR | Status: DC | PRN

## 2012-02-25 MED ORDER — METOCLOPRAMIDE HCL 5 MG/ML IJ SOLN: 5.0000 mg | Freq: Three times a day (TID) | INTRAMUSCULAR | Status: DC | PRN

## 2012-02-25 MED ORDER — MIDAZOLAM HCL 5 MG/5ML IJ SOLN
INTRAMUSCULAR | Status: DC | PRN
  Administered 2012-02-25: 2 mg via INTRAVENOUS

## 2012-02-25 MED ORDER — ONDANSETRON HCL 4 MG/2ML IJ SOLN: 4.0000 mg | Freq: Once | INTRAMUSCULAR | Status: DC | PRN

## 2012-02-25 MED ORDER — FENTANYL CITRATE 0.05 MG/ML IJ SOLN
INTRAMUSCULAR | Status: DC | PRN
  Administered 2012-02-25: 100 ug via INTRAVENOUS
  Administered 2012-02-25: 50 ug via INTRAVENOUS
  Administered 2012-02-25: 100 ug via INTRAVENOUS
  Administered 2012-02-25 (×3): 50 ug via INTRAVENOUS

## 2012-02-25 MED ORDER — EPHEDRINE SULFATE 50 MG/ML IJ SOLN
INTRAMUSCULAR | Status: DC | PRN
  Administered 2012-02-25 (×5): 5 mg via INTRAVENOUS

## 2012-02-25 MED ORDER — PATIENT'S GUIDE TO USING COUMADIN BOOK
Freq: Once | Status: DC
  Filled 2012-02-25: qty 1

## 2012-02-25 MED ORDER — MORPHINE SULFATE 4 MG/ML IJ SOLN
INTRAMUSCULAR | Status: DC | PRN
  Administered 2012-02-25: 4 mg

## 2012-02-25 MED ORDER — WARFARIN VIDEO: Freq: Once | Status: DC

## 2012-02-25 MED ORDER — HYDROMORPHONE 0.3 MG/ML IV SOLN
INTRAVENOUS | Status: AC
  Filled 2012-02-25: qty 25

## 2012-02-25 MED ORDER — OXYCODONE HCL 5 MG PO TABS: 5.0000 mg | ORAL_TABLET | Freq: Once | ORAL | Status: DC | PRN

## 2012-02-25 MED ORDER — POTASSIUM CHLORIDE IN NACL 20-0.9 MEQ/L-% IV SOLN
INTRAVENOUS | Status: AC
  Administered 2012-02-26: 07:00:00 via INTRAVENOUS
  Filled 2012-02-25 (×3): qty 1000

## 2012-02-25 MED ORDER — LACTATED RINGERS IV SOLN
INTRAVENOUS | Status: DC | PRN
  Administered 2012-02-25 (×4): via INTRAVENOUS

## 2012-02-25 MED ORDER — CEFUROXIME SODIUM 1.5 G IJ SOLR
INTRAMUSCULAR | Status: DC | PRN
  Administered 2012-02-25: 1.5 g

## 2012-02-25 MED ORDER — PHENOL 1.4 % MT LIQD: 1.0000 | OROMUCOSAL | Status: DC | PRN

## 2012-02-25 MED ORDER — DOCUSATE SODIUM 100 MG PO CAPS
100.0000 mg | ORAL_CAPSULE | Freq: Two times a day (BID) | ORAL | Status: DC
  Administered 2012-02-25 – 2012-02-28 (×6): 100 mg via ORAL
  Filled 2012-02-25 (×7): qty 1

## 2012-02-25 MED ORDER — METHOCARBAMOL 500 MG PO TABS
500.0000 mg | ORAL_TABLET | Freq: Four times a day (QID) | ORAL | Status: DC | PRN
  Administered 2012-02-26 – 2012-02-28 (×5): 500 mg via ORAL
  Filled 2012-02-25 (×5): qty 1

## 2012-02-25 MED ORDER — NALOXONE HCL 0.4 MG/ML IJ SOLN: 0.4000 mg | INTRAMUSCULAR | Status: DC | PRN

## 2012-02-25 MED ORDER — CLONIDINE HCL (ANALGESIA) 100 MCG/ML EP SOLN
150.0000 ug | EPIDURAL | Status: AC
  Administered 2012-02-25: .9 mL via INTRA_ARTICULAR
  Filled 2012-02-25: qty 1.5

## 2012-02-25 MED ORDER — HYDROMORPHONE 0.3 MG/ML IV SOLN
INTRAVENOUS | Status: DC
  Administered 2012-02-25: 1.5 mg via INTRAVENOUS
  Administered 2012-02-25: 25 mL via INTRAVENOUS
  Administered 2012-02-26: 2.1 mg via INTRAVENOUS
  Administered 2012-02-26: 2.7 mg via INTRAVENOUS
  Filled 2012-02-25: qty 25

## 2012-02-25 MED ORDER — TAMSULOSIN HCL 0.4 MG PO CAPS
0.4000 mg | ORAL_CAPSULE | Freq: Every day | ORAL | Status: DC
  Administered 2012-02-28: 0.4 mg via ORAL
  Filled 2012-02-25 (×4): qty 1

## 2012-02-25 MED ORDER — HYDROMORPHONE HCL PF 1 MG/ML IJ SOLN
INTRAMUSCULAR | Status: AC
  Filled 2012-02-25: qty 2

## 2012-02-25 MED ORDER — OXYCODONE HCL 5 MG PO TABS
5.0000 mg | ORAL_TABLET | ORAL | Status: DC | PRN
  Administered 2012-02-26 – 2012-02-28 (×7): 10 mg via ORAL
  Filled 2012-02-25 (×7): qty 2

## 2012-02-25 MED ORDER — ACETAMINOPHEN 650 MG RE SUPP: 650.0000 mg | Freq: Four times a day (QID) | RECTAL | Status: DC | PRN

## 2012-02-25 MED ORDER — WARFARIN SODIUM 7.5 MG PO TABS
7.5000 mg | ORAL_TABLET | Freq: Once | ORAL | Status: AC
  Administered 2012-02-25: 7.5 mg via ORAL
  Filled 2012-02-25: qty 1

## 2012-02-25 MED ORDER — WARFARIN - PHARMACIST DOSING INPATIENT
Freq: Every day | Status: DC
  Administered 2012-02-26: 18:00:00

## 2012-02-25 MED ORDER — PROPOFOL 10 MG/ML IV BOLUS
INTRAVENOUS | Status: DC | PRN
  Administered 2012-02-25: 200 mg via INTRAVENOUS

## 2012-02-25 MED ORDER — OXYCODONE HCL 5 MG/5ML PO SOLN: 5.0000 mg | Freq: Once | ORAL | Status: DC | PRN

## 2012-02-25 MED ORDER — ROCURONIUM BROMIDE 100 MG/10ML IV SOLN
INTRAVENOUS | Status: DC | PRN
  Administered 2012-02-25: 50 mg via INTRAVENOUS

## 2012-02-25 MED ORDER — BUPIVACAINE HCL (PF) 0.25 % IJ SOLN
INTRAMUSCULAR | Status: AC
  Filled 2012-02-25: qty 30

## 2012-02-25 MED ORDER — DIPHENHYDRAMINE HCL 50 MG/ML IJ SOLN: 12.5000 mg | Freq: Four times a day (QID) | INTRAMUSCULAR | Status: DC | PRN

## 2012-02-25 MED ORDER — WHITE PETROLATUM GEL
Status: AC
  Filled 2012-02-25: qty 5

## 2012-02-25 MED ORDER — METHOCARBAMOL 100 MG/ML IJ SOLN
500.0000 mg | Freq: Four times a day (QID) | INTRAVENOUS | Status: DC | PRN
  Filled 2012-02-25: qty 5

## 2012-02-25 MED ORDER — INFLUENZA VIRUS VACC SPLIT PF IM SUSP
0.5000 mL | INTRAMUSCULAR | Status: AC
  Administered 2012-02-26: 0.5 mL via INTRAMUSCULAR
  Filled 2012-02-25: qty 0.5

## 2012-02-25 MED ORDER — CEFAZOLIN SODIUM 1-5 GM-% IV SOLN
1.0000 g | Freq: Four times a day (QID) | INTRAVENOUS | Status: AC
  Administered 2012-02-25 (×2): 1 g via INTRAVENOUS
  Filled 2012-02-25 (×2): qty 50

## 2012-02-25 MED ORDER — NEOSTIGMINE METHYLSULFATE 1 MG/ML IJ SOLN
INTRAMUSCULAR | Status: DC | PRN
  Administered 2012-02-25: 3 mg via INTRAVENOUS

## 2012-02-25 MED ORDER — CELECOXIB 200 MG PO CAPS
200.0000 mg | ORAL_CAPSULE | Freq: Two times a day (BID) | ORAL | Status: DC
  Administered 2012-02-25 – 2012-02-28 (×6): 200 mg via ORAL
  Filled 2012-02-25 (×7): qty 1

## 2012-02-25 MED ORDER — ONDANSETRON HCL 4 MG/2ML IJ SOLN
INTRAMUSCULAR | Status: DC | PRN
  Administered 2012-02-25: 4 mg via INTRAVENOUS

## 2012-02-25 MED ORDER — HYDROMORPHONE HCL PF 1 MG/ML IJ SOLN
INTRAMUSCULAR | Status: DC | PRN
  Administered 2012-02-25: 0.5 mg via INTRAVENOUS

## 2012-02-25 MED ORDER — LIDOCAINE HCL (CARDIAC) 20 MG/ML IV SOLN
INTRAVENOUS | Status: DC | PRN
  Administered 2012-02-25: 80 mg via INTRAVENOUS

## 2012-02-25 MED ORDER — DIPHENHYDRAMINE HCL 12.5 MG/5ML PO ELIX: 12.5000 mg | ORAL_SOLUTION | Freq: Four times a day (QID) | ORAL | Status: DC | PRN

## 2012-02-25 MED ORDER — PNEUMOCOCCAL VAC POLYVALENT 25 MCG/0.5ML IJ INJ
0.5000 mL | INJECTION | INTRAMUSCULAR | Status: AC
  Administered 2012-02-26: 0.5 mL via INTRAMUSCULAR
  Filled 2012-02-25: qty 0.5

## 2012-02-25 MED ORDER — MENTHOL 3 MG MT LOZG: 1.0000 | LOZENGE | OROMUCOSAL | Status: DC | PRN

## 2012-02-25 MED ORDER — HYDROMORPHONE HCL PF 1 MG/ML IJ SOLN
0.2500 mg | INTRAMUSCULAR | Status: DC | PRN
  Administered 2012-02-25 (×5): 0.5 mg via INTRAVENOUS

## 2012-02-25 MED ORDER — CEFUROXIME SODIUM 1.5 G IJ SOLR
INTRAMUSCULAR | Status: AC
  Filled 2012-02-25: qty 1.5

## 2012-02-25 MED ORDER — HYDROMORPHONE HCL PF 1 MG/ML IJ SOLN
INTRAMUSCULAR | Status: AC
  Filled 2012-02-25: qty 1

## 2012-02-25 MED ORDER — GABAPENTIN 100 MG PO CAPS
100.0000 mg | ORAL_CAPSULE | Freq: Every day | ORAL | Status: DC
  Administered 2012-02-25 – 2012-02-27 (×3): 100 mg via ORAL
  Filled 2012-02-25 (×4): qty 1

## 2012-02-25 MED ORDER — ONDANSETRON HCL 4 MG PO TABS: 4.0000 mg | ORAL_TABLET | Freq: Four times a day (QID) | ORAL | Status: DC | PRN

## 2012-02-25 MED ORDER — METOCLOPRAMIDE HCL 10 MG PO TABS: 5.0000 mg | ORAL_TABLET | Freq: Three times a day (TID) | ORAL | Status: DC | PRN

## 2012-02-25 MED ORDER — BUPIVACAINE HCL (PF) 0.25 % IJ SOLN
INTRAMUSCULAR | Status: DC | PRN
  Administered 2012-02-25: 30 mL via INTRA_ARTICULAR

## 2012-02-25 MED ORDER — ACETAMINOPHEN 325 MG PO TABS: 650.0000 mg | ORAL_TABLET | Freq: Four times a day (QID) | ORAL | Status: DC | PRN

## 2012-02-25 MED ORDER — MORPHINE SULFATE 4 MG/ML IJ SOLN
INTRAMUSCULAR | Status: AC
  Filled 2012-02-25: qty 1

## 2012-02-25 MED ORDER — ALBUTEROL SULFATE HFA 108 (90 BASE) MCG/ACT IN AERS
2.0000 | INHALATION_SPRAY | RESPIRATORY_TRACT | Status: DC | PRN
  Administered 2012-02-25 – 2012-02-26 (×2): 2 via RESPIRATORY_TRACT
  Filled 2012-02-25: qty 6.7

## 2012-02-25 MED ORDER — CHLORHEXIDINE GLUCONATE 4 % EX LIQD: 60.0000 mL | Freq: Once | CUTANEOUS | Status: DC

## 2012-02-25 MED ORDER — SODIUM CHLORIDE 0.9 % IR SOLN
Status: DC | PRN
  Administered 2012-02-25: 12000 mL

## 2012-02-25 MED ORDER — SODIUM CHLORIDE 0.9 % IJ SOLN: 9.0000 mL | INTRAMUSCULAR | Status: DC | PRN

## 2012-02-25 SURGICAL SUPPLY — 86 items
ADAPTER BOLT FEMORAL +2/-2 (Knees) ×1 IMPLANT
ADPR FEM +2/-2 OFST BOLT (Knees) ×1 IMPLANT
ADPR FEM 5D STRL KN PFC SGM (Orthopedic Implant) ×1 IMPLANT
BANDAGE ELASTIC 4 VELCRO ST LF (GAUZE/BANDAGES/DRESSINGS) ×2 IMPLANT
BANDAGE ELASTIC 6 VELCRO ST LF (GAUZE/BANDAGES/DRESSINGS) ×5 IMPLANT
BANDAGE ESMARK 6X9 LF (GAUZE/BANDAGES/DRESSINGS) ×1 IMPLANT
BLADE SAG 18X100X1.27 (BLADE) ×2 IMPLANT
BLADE SAW SGTL 13.0X1.19X90.0M (BLADE) ×2 IMPLANT
BLADE SURG 10 STRL SS (BLADE) ×4 IMPLANT
BNDG CMPR 9X6 STRL LF SNTH (GAUZE/BANDAGES/DRESSINGS) ×1
BNDG COHESIVE 6X5 TAN STRL LF (GAUZE/BANDAGES/DRESSINGS) ×2 IMPLANT
BNDG ESMARK 6X9 LF (GAUZE/BANDAGES/DRESSINGS) ×2
BOWL SMART MIX CTS (DISPOSABLE) ×2 IMPLANT
CEMENT HV SMART SET (Cement) ×5 IMPLANT
CEMENT RESTRICTOR DEPUY SZ 3 (Cement) ×1 IMPLANT
CLOTH BEACON ORANGE TIMEOUT ST (SAFETY) ×2 IMPLANT
COVER BACK TABLE 24X17X13 BIG (DRAPES) IMPLANT
COVER SURGICAL LIGHT HANDLE (MISCELLANEOUS) ×2 IMPLANT
CUFF TOURNIQUET SINGLE 34IN LL (TOURNIQUET CUFF) ×2 IMPLANT
CUFF TOURNIQUET SINGLE 44IN (TOURNIQUET CUFF) IMPLANT
DRAPE INCISE IOBAN 66X45 STRL (DRAPES) IMPLANT
DRAPE ORTHO SPLIT 77X108 STRL (DRAPES) ×4
DRAPE PROXIMA HALF (DRAPES) ×2 IMPLANT
DRAPE SURG ORHT 6 SPLT 77X108 (DRAPES) ×2 IMPLANT
DRAPE U-SHAPE 47X51 STRL (DRAPES) ×2 IMPLANT
DRAPE X-RAY CASS 24X20 (DRAPES) IMPLANT
DRSG PAD ABDOMINAL 8X10 ST (GAUZE/BANDAGES/DRESSINGS) ×4 IMPLANT
DURAPREP 26ML APPLICATOR (WOUND CARE) ×2 IMPLANT
ELECT REM PT RETURN 9FT ADLT (ELECTROSURGICAL) ×2
ELECTRODE REM PT RTRN 9FT ADLT (ELECTROSURGICAL) ×1 IMPLANT
EVACUATOR 1/8 PVC DRAIN (DRAIN) ×2 IMPLANT
FACESHIELD LNG OPTICON STERILE (SAFETY) ×2 IMPLANT
FEMORAL ADAPTER (Orthopedic Implant) ×1 IMPLANT
FEMUR RT TC3 SIZE 2 (Knees) ×1 IMPLANT
GAUZE XEROFORM 5X9 LF (GAUZE/BANDAGES/DRESSINGS) ×2 IMPLANT
GLOVE BIO SURGEON ST LM GN SZ9 (GLOVE) ×2 IMPLANT
GLOVE BIOGEL PI IND STRL 7.5 (GLOVE) IMPLANT
GLOVE BIOGEL PI IND STRL 8 (GLOVE) ×1 IMPLANT
GLOVE BIOGEL PI INDICATOR 7.5 (GLOVE) ×1
GLOVE BIOGEL PI INDICATOR 8 (GLOVE) ×1
GLOVE ECLIPSE 7.0 STRL STRAW (GLOVE) ×1 IMPLANT
GLOVE SURG ORTHO 8.0 STRL STRW (GLOVE) ×2 IMPLANT
GOWN PREVENTION PLUS LG XLONG (DISPOSABLE) ×1 IMPLANT
GOWN PREVENTION PLUS XLARGE (GOWN DISPOSABLE) ×2 IMPLANT
GOWN STRL NON-REIN LRG LVL3 (GOWN DISPOSABLE) ×6 IMPLANT
HANDPIECE INTERPULSE COAX TIP (DISPOSABLE) ×2
HOOD PEEL AWAY FACE SHEILD DIS (HOOD) ×6 IMPLANT
IMMOBILIZER KNEE 20 (SOFTGOODS)
IMMOBILIZER KNEE 20 THIGH 36 (SOFTGOODS) IMPLANT
IMMOBILIZER KNEE 22 UNIV (SOFTGOODS) ×2 IMPLANT
IMMOBILIZER KNEE 24 THIGH 36 (MISCELLANEOUS) IMPLANT
IMMOBILIZER KNEE 24 UNIV (MISCELLANEOUS)
INSERT TC3 2/20 (Insert) ×1 IMPLANT
KIT BASIN OR (CUSTOM PROCEDURE TRAY) ×2 IMPLANT
KIT ROOM TURNOVER OR (KITS) ×2 IMPLANT
MANIFOLD NEPTUNE II (INSTRUMENTS) ×2 IMPLANT
NDL SPNL 18GX3.5 QUINCKE PK (NEEDLE) ×1 IMPLANT
NEEDLE SPNL 18GX3.5 QUINCKE PK (NEEDLE) ×2 IMPLANT
NS IRRIG 1000ML POUR BTL (IV SOLUTION) ×2 IMPLANT
PACK TOTAL JOINT (CUSTOM PROCEDURE TRAY) ×2 IMPLANT
PAD ARMBOARD 7.5X6 YLW CONV (MISCELLANEOUS) ×4 IMPLANT
PAD CAST 4YDX4 CTTN HI CHSV (CAST SUPPLIES) ×1 IMPLANT
PADDING CAST COTTON 4X4 STRL (CAST SUPPLIES) ×2
PADDING CAST COTTON 6X4 STRL (CAST SUPPLIES) ×4 IMPLANT
RUBBERBAND STERILE (MISCELLANEOUS) ×1 IMPLANT
SET HNDPC FAN SPRY TIP SCT (DISPOSABLE) ×1 IMPLANT
SPONGE GAUZE 4X4 12PLY (GAUZE/BANDAGES/DRESSINGS) ×3 IMPLANT
SPONGE LAP 18X18 X RAY DECT (DISPOSABLE) IMPLANT
STAPLER VISISTAT 35W (STAPLE) ×2 IMPLANT
STEM TIBIA PFC 13X30MM (Stem) ×1 IMPLANT
STEM UNIVERSAL REVISION 75X16 (Stem) ×1 IMPLANT
SUCTION FRAZIER TIP 10 FR DISP (SUCTIONS) ×2 IMPLANT
SUT ETHILON 3 0 PS 1 (SUTURE) ×4 IMPLANT
SUT VIC AB 0 CTB1 27 (SUTURE) ×6 IMPLANT
SUT VIC AB 1 CT1 27 (SUTURE) ×10
SUT VIC AB 1 CT1 27XBRD ANBCTR (SUTURE) ×5 IMPLANT
SUT VIC AB 2-0 CT1 27 (SUTURE) ×6
SUT VIC AB 2-0 CT1 TAPERPNT 27 (SUTURE) ×2 IMPLANT
SYR 30ML SLIP (SYRINGE) ×2 IMPLANT
TOWEL OR 17X24 6PK STRL BLUE (TOWEL DISPOSABLE) ×2 IMPLANT
TOWEL OR 17X26 10 PK STRL BLUE (TOWEL DISPOSABLE) ×4 IMPLANT
TRAY FOLEY CATH 14FR (SET/KITS/TRAYS/PACK) ×2 IMPLANT
TRAY SLEEVE CEM ML (Knees) ×1 IMPLANT
TRAY TIB SZ 2 REVISION (Knees) ×1 IMPLANT
TUBE ANAEROBIC SPECIMEN COL (MISCELLANEOUS) ×1 IMPLANT
WATER STERILE IRR 1000ML POUR (IV SOLUTION) ×6 IMPLANT

## 2012-02-25 NOTE — Plan of Care (Signed)
Problem: Consults Goal: Diagnosis- Total Joint Replacement Primary Total Knee     

## 2012-02-25 NOTE — Brief Op Note (Signed)
02/25/2012  12:09 PM  PATIENT:  [name]  57 y.o. female  PRE-OPERATIVE DIAGNOSIS:  Loose Right total knee arthroplasty  POST-OPERATIVE DIAGNOSIS:  Loose Right total knee arthroplasty  PROCEDURE:  Procedure(s): TOTAL KNEE REVISION  SURGEON:  Surgeon(s): Cammy Copa, MD  ASSISTANT: S vernon  ANESTHESIA:   general  EBL: 250 ml    Total I/O In: 3000 [I.V.:3000] Out: 625 [Urine:325; Blood:300]  BLOOD ADMINISTERED: none  DRAINS: none   LOCAL MEDICATIONS USED:  none  SPECIMEN:  No Specimen  COUNTS:  YES  TOURNIQUET:   Total Tourniquet Time Documented: Thigh (Right) - 144 minutes  DICTATION: .Other Dictation: Dictation Number [id number]  PLAN OF CARE: Admit to inpatient   PATIENT DISPOSITION:  PACU - hemodynamically stable

## 2012-02-25 NOTE — H&P (Signed)
NAMETASCHA, BATTE NO.:  [id number]  MEDICAL RECORD NO.:  [id number]  LOCATION:  5N16C                        FACILITY:  [facility]  PHYSICIAN:  Burnard Bunting, M.D.    DATE OF BIRTH:  [birth year]  DATE OF ADMISSION:  02/25/2012 DATE OF DISCHARGE:                             HISTORY & PHYSICAL   CHIEF COMPLAINT:  Right knee pain.  HISTORY OF PRESENT ILLNESS:  [name] is a 56 year old patient who underwent total knee replacement 5 years ago.  She had relatively acute onset of pain about 4-5 months ago.  She has tried anti-inflammatory medications, bracing, activity modification, physical therapy, quadriceps strengthening, none of this had helped.  She reports a lot of pain particularly with startup activity.  She denies any fever and chills.  She reports "pain in the bone."  She has had laboratory values drawn in July, which were negative for infection.  She can walk less than half a block.  The patient states that the pain is waking her at night.  She has tried a knee sleeve, which did not help.  She reports giving way.  She works at [employer].  She has had a bone scan, which shows possible loosening of the fibula and femoral component.  Her medications include pain medicine, along with anti-inflammatories.  She has no known drug allergies.  Prior surgeries; hysterectomy, total knee replacement, hernia surgery in 2011.  FAMILY MEDICAL HISTORY:  Positive for heart disease and lung disease.  SOCIAL HISTORY:  She is a widow, works at [employer].  No family history of DVT.  REVIEW OF SYSTEMS:  All other systems reviewed are negative as they related to the right knee.  PHYSICAL EXAMINATION:  GENERAL:  She is well developed, well nourished, no acute distress, alert and oriented.  Antalgic gait to the right. CHEST:  Clear to auscultation. HEART:  Regular rate and rhythm. ABDOMEN:  Benign. EXTREMITIES:  Right knee demonstrates no effusion, intact  extensor mechanism, good range of motion.  No mid flexion instability. Collaterals are stable.  Pedal pulses are palpable.  No skin temperature, color changes, difference in the right versus the left knee.  RADIOGRAPHS:  Consistent with possible loosening particularly of the tibial component.  Bone scan shows probable loosening of the fibula and femoral components.  Laboratory values are negative for infection.  IMPRESSION:  Painful right total knee arthroplasty pain, relatively sudden onset.  Infection possibility is unlikely.  Loosening is much more likely based on her startup pain and the pain with activity.  PLAN:  TKA revision with cultures obtained at the time of surgery.  We will give intraoperative frozen section.  Risks and benefits of the surgery were discussed with the patient including, but not limited to infection, nerve and vessel damage, incomplete pain relief.  All questions were answered.     Burnard Bunting, M.D.     GSD/MEDQ  D:  02/25/2012  T:  02/25/2012  Job:  [id number]

## 2012-02-25 NOTE — Progress Notes (Signed)
UR COMPLETED  

## 2012-02-25 NOTE — Progress Notes (Signed)
Pt reports inc. Tingling in toes of right foot.  DP pulse is +3 with 3 second capillary refill.  Spoke with Fayrene Fearing, dr. Alfonso Patten PA.  Relayed message to Dr. August Saucer.  Dr. August Saucer stated that the tingling was from the tourniquet and will improve over time.

## 2012-02-25 NOTE — H&P (Signed)
  [id number] 

## 2012-02-25 NOTE — Anesthesia Preprocedure Evaluation (Addendum)
Anesthesia Evaluation  Patient identified by MRN, date of birth, ID band Patient awake    Airway Mallampati: I TM Distance: >3 FB Neck ROM: Full    Dental  (+) Teeth Intact, Missing, Poor Dentition and Dental Advisory Given   Pulmonary shortness of breath, asthma ,  breath sounds clear to auscultation        Cardiovascular + Past MI Rhythm:Regular Rate:Normal     Neuro/Psych  Neuromuscular disease    GI/Hepatic PUD, GERD-  Medicated and Controlled,  Endo/Other  diabetes, Well Controlled  Renal/GU Renal disease     Musculoskeletal   Abdominal   Peds  Hematology   Anesthesia Other Findings   Reproductive/Obstetrics                          Anesthesia Physical Anesthesia Plan  ASA: III  Anesthesia Plan: General   Post-op Pain Management:    Induction: Intravenous  Airway Management Planned: Oral ETT  Additional Equipment:   Intra-op Plan:   Post-operative Plan: Extubation in OR  Informed Consent: I have reviewed the patients History and Physical, chart, labs and discussed the procedure including the risks, benefits and alternatives for the proposed anesthesia with the patient or authorized representative who has indicated his/her understanding and acceptance.   Dental advisory given  Plan Discussed with: CRNA, Anesthesiologist and Surgeon  Anesthesia Plan Comments:         Anesthesia Quick Evaluation

## 2012-02-25 NOTE — Transfer of Care (Signed)
Immediate Anesthesia Transfer of Care Note  Patient: [name]  Procedure(s) Performed: Procedure(s) (LRB) with comments: TOTAL KNEE REVISION (Right) - Revise right total knee arthroplasty  Patient Location: PACU  Anesthesia Type: General  Level of Consciousness: awake, alert  and oriented  Airway & Oxygen Therapy: Patient Spontanous Breathing and Patient connected to nasal cannula oxygen  Post-op Assessment: Report given to PACU RN, Post -op Vital signs reviewed and stable and Patient moving all extremities X 4  Post vital signs: Reviewed and stable  Complications: No apparent anesthesia complications

## 2012-02-25 NOTE — Anesthesia Postprocedure Evaluation (Signed)
  Anesthesia Post-op Note  Patient: [name]  Procedure(s) Performed: Procedure(s) (LRB) with comments: TOTAL KNEE REVISION (Right) - Revise right total knee arthroplasty  Patient Location: PACU  Anesthesia Type: General  Level of Consciousness: awake, alert  and oriented  Airway and Oxygen Therapy: Patient Spontanous Breathing and Patient connected to nasal cannula oxygen  Post-op Pain: mild  Post-op Assessment: Post-op Vital signs reviewed  Post-op Vital Signs: Reviewed  Complications: No apparent anesthesia complications

## 2012-02-25 NOTE — Progress Notes (Signed)
Orthopedic Tech Progress Note Patient Details:  [name] [birth year] [id number]  CPM Right Knee CPM Right Knee: On Right Knee Flexion (Degrees): 30  Right Knee Extension (Degrees): 0  Additional Comments: trapeze bar   Cammer, Mickie Bail 02/25/2012, 2:16 PM

## 2012-02-25 NOTE — H&P (Signed)
NAMEALIZIA, GREIF NO.:  [id number]  MEDICAL RECORD NO.:  [id number]  LOCATION:  5N16C                        FACILITY:  [facility]  PHYSICIAN:  Burnard Bunting, M.D.    DATE OF BIRTH:  1955-09-09  DATE OF ADMISSION:  02/25/2012 DATE OF DISCHARGE:                             HISTORY & PHYSICAL   CHIEF COMPLAINT:  Right knee pain.  HISTORY OF PRESENT ILLNESS:  [name] is a 56 year old patient with right knee pain.  She had right total knee replacement 5 years ago.  She has developed about 3-4 months of progressive acute onset right knee pain.  She denies any history of trauma.  Pain medicines to ease it. She denies any fever and chills.  She reports "pain down to the bone." She has had laboratory values, which were negative for infection.  She states she can walk less than half a block.  The pain is waking her at night.  She has tried a knee sleeve, which did not help.  She reports giving way in the knee.  She works at [employer].  She has had a bone scan, which shows possible loosening and she has also had an MRI scan of her back, which shows no right-sided localizing nerve compression.  The pain is intractable and severe and is preventing her from doing her work as well as her ADLs.  She has tried anti-inflammatories and pain medicine, which did not given her consistent stable relief.  Prior surgeries include a hysterectomy in 2008, total knee replacement in 2009, hernia surgery in 2011.  FAMILY MEDICAL HISTORY:  Heart disease and lung disease.  SOCIAL HISTORY:  The patient is widow.  She works at [employer].  Does not smoke or drink.  No family history of DVT.  REVIEW OF SYSTEMS:  All other systems reviewed are negative as they related to the right knee.  PHYSICAL EXAMINATION:  She is well developed, well nourished, no acute distress, alert and oriented.  Normal body mass index.  Antalgic gait to the right.  She has palpable pedal pulses.  No effusion in the  right knee.  No warmth in the right knee.  Collaterals are stable.  There is no mid flexion instability.  Extensor mechanism is intact.  Well-healed surgical incision is present.  RADIOGRAPHS:  Potentially some small lucencies around the tibial component.  Bone scan from December 10, 2011, shows evidence consistent with loosening of the femoral and tibial components.  IMPRESSION:  Right total knee arthroplasty pain with laboratory values indicating no infection.  Bone scan suggesting loosening.  Probably aseptic loosening, no effusion in the knee is present.  The patient has had a long course of nonoperative therapy.  Plan is for total knee arthroplasty and revision.  Risks and benefits were discussed with the patient including, but not limited to incomplete pain relief, infection, nerve and vessel damage, time out of work.  Dictation ended at this point.     Burnard Bunting, M.D.     GSD/MEDQ  D:  02/25/2012  T:  02/25/2012  Job:  [id number]

## 2012-02-25 NOTE — Progress Notes (Signed)
Report  To Renee Rival RN as primary caregiver.

## 2012-02-25 NOTE — Interval H&P Note (Signed)
History and Physical Interval Note:  02/25/2012 7:36 AM  [name]  has presented today for surgery, with the diagnosis of Loose Right total knee arthroplasty  The various methods of treatment have been discussed with the patient and family. After consideration of risks, benefits and other options for treatment, the patient has consented to  Procedure(s) (LRB) with comments: TOTAL KNEE REVISION (Right) - Revise right total knee arthroplasty as a surgical intervention .  The patient's history has been reviewed, patient examined, no change in status, stable for surgery.  I have reviewed the patient's chart and labs.  Questions were answered to the patient's satisfaction.     Embrie Mikkelsen SCOTT

## 2012-02-25 NOTE — Progress Notes (Signed)
ANTICOAGULATION CONSULT NOTE - Initial Consult  Pharmacy Consult:  Coumadin Indication:  VTE prophylaxis  No Known Allergies  Patient Measurements: Weight = 82 kg Height = 160 cm  Vital Signs: Temp: 98 F (36.7 C) (10/01 1211) Temp src: Oral (10/01 0552) BP: 93/60 mmHg (10/01 1400) Pulse Rate: 61  (10/01 1400)  Labs: No results found for this basename: HGB:2,HCT:3,PLT:3,APTT:3,LABPROT:3,INR:3,HEPARINUNFRC:3,CREATININE:3,CKTOTAL:3,CKMB:3,TROPONINI:3 in the last 72 hours  The CrCl is unknown because both a height and weight (above a minimum accepted value) are required for this calculation.   Medical History: Past Medical History  Diagnosis Date  . Asthma   . Diabetes mellitus   . Complication of anesthesia     difficulty waking up    . Myocardial infarction     2 yrs ago   . Shortness of breath     with exertion   . Kidney stones   . Arthritis         Assessment: 67 YOF s/p revision of right TKA to start Coumadin for VTE prophylaxis.  Baseline INR 0.95; CBC WNL pre-op.   Goal of Therapy:  INR 2-3 Monitor platelets by anticoagulation protocol: Yes     Plan:  - Coumadin 7.5mg  PO today - Daily PT / INR - Coumadin book / video    Jossie Smoot D. Laney Potash, PharmD, BCPS Pager:  [phone] 02/25/2012, 3:47 PM

## 2012-02-26 LAB — GLUCOSE, CAPILLARY
Glucose-Capillary: 157 mg/dL — ABNORMAL HIGH (ref 70–99)
Glucose-Capillary: 179 mg/dL — ABNORMAL HIGH (ref 70–99)

## 2012-02-26 LAB — BASIC METABOLIC PANEL
BUN: 13 mg/dL (ref 6–23)
Chloride: 99 mEq/L (ref 96–112)
Creatinine, Ser: 0.96 mg/dL (ref 0.50–1.10)
GFR calc Af Amer: 76 mL/min — ABNORMAL LOW (ref >90)
GFR calc non Af Amer: 66 mL/min — ABNORMAL LOW (ref >90)
Potassium: 4.3 mEq/L (ref 3.5–5.1)

## 2012-02-26 LAB — CBC
HCT: 30.8 % — ABNORMAL LOW (ref 36.0–46.0)
MCHC: 31.5 g/dL (ref 30.0–36.0)
MCV: 92.5 fL (ref 78.0–100.0)
Platelets: 180 10*3/uL (ref 150–400)
RBC: 3.33 MIL/uL — ABNORMAL LOW (ref 3.87–5.11)
RDW: 14.1 % (ref 11.5–15.5)

## 2012-02-26 LAB — PROTIME-INR: Prothrombin Time: 13.8 seconds (ref 11.6–15.2)

## 2012-02-26 MED ORDER — OXYCODONE HCL 10 MG PO TB12
10.0000 mg | ORAL_TABLET | Freq: Two times a day (BID) | ORAL | Status: DC
  Administered 2012-02-26 – 2012-02-28 (×5): 10 mg via ORAL
  Filled 2012-02-26 (×5): qty 1

## 2012-02-26 MED ORDER — WARFARIN SODIUM 7.5 MG PO TABS
7.5000 mg | ORAL_TABLET | Freq: Once | ORAL | Status: AC
  Administered 2012-02-26: 7.5 mg via ORAL
  Filled 2012-02-26: qty 1

## 2012-02-26 NOTE — Progress Notes (Signed)
ANTICOAGULATION CONSULT - COUMADIN  Pharmacy Consult for Coumadin  HPI: 56 y.o.female admitted for Loose Right total knee arthroplasty 1 Day Post-Op S/p Revision R-TKA placed on Coumadin for VTE prophylaxis   Allergies: No Known Allergies  Height/Weight: Height: 5' 2.99" (160 cm) Weight: 180 lb 8.9 oz (81.9 kg) IBW/kg (Calculated) : 52.38   Vitals: Blood pressure 111/76, pulse 77, temperature 98.1 F (36.7 C), temperature source Oral, resp. rate 9, height 5' 2.99" (1.6 m), weight 180 lb 8.9 oz (81.9 kg), SpO2 98.00%.  Medical / Surgical History: Past Medical History  Diagnosis Date  . Asthma   . Diabetes mellitus   . Complication of anesthesia     difficulty waking up    . Myocardial infarction     2 yrs ago   . Shortness of breath     with exertion   . Kidney stones   . Arthritis    Procedure  . Replacement total knee  . Abdominal hysterectomy  . Cholecystectomy  . Hernia repair  . Hiatal hernia repair  . Wrist surgery       Current Labs:   [facility] 02/26/12 0637  HGB 9.7*  HCT 30.8*  PLT 180  LABPROT 13.8  INR 1.07  CREATININE 0.96   Lab Results  Component Value Date   INR 1.07 02/26/2012   INR 0.95 02/20/2012   INR 0.91 04/04/2009   Estimated Creatinine Clearance: 67.9 ml/min (by C-G formula based on Cr of 0.96).  Pertinent Medication History: Medication Sig  . albuterol (PROVENTIL HFA;VENTOLIN HFA) 108 (90 BASE) MCG/ACT inhaler Inhale 2 puffs into the lungs every 4 (four) hours as needed. For shortness of breath  . gabapentin (NEURONTIN) 100 MG capsule Take 1 capsule (100 mg total) by mouth at bedtime.  Marland Kitchen oxyCODONE-acetaminophen (PERCOCET/ROXICET) 5-325 MG per tablet Take 1-2 tablets by mouth every 4 (four) hours as needed. For pain  . Tamsulosin HCl (FLOMAX) 0.4 MG CAPS Take 1 capsule (0.4 mg total) by mouth daily after breakfast.   Scheduled:    .  ceFAZolin (ANCEF) IV  1 g Intravenous Q6H  . celecoxib  200 mg Oral Q12H  . docusate sodium   100 mg Oral BID  . gabapentin  100 mg Oral QHS  . HYDROmorphone PCA 0.3 mg/mL   Intravenous Q4H  . influenza  inactive virus vaccine  0.5 mL Intramuscular Tomorrow-1000  . pneumococcal 23 valent vaccine  0.5 mL Intramuscular Tomorrow-1000  . Tamsulosin HCl  0.4 mg Oral QPC breakfast  . warfarin  7.5 mg Oral ONCE-1800  . warfarin  7.5 mg Oral ONCE-1800   Assessment:  Reported INR 1.07.  No reported acute bleeding complications..  Goals:  Target INR of 2-3.  Plan:  Will repeat Coumadin 7.5 mg today.    Daily INR's, CBC.  Leiya Keesey, Elisha Headland, Pharm. D. 02/26/2012, 12:03 PM

## 2012-02-26 NOTE — Op Note (Signed)
[name], [name] NO.:  [id number]  MEDICAL RECORD NO.:  [id number]  LOCATION:  5N16C                        FACILITY:  [facility]  PHYSICIAN:  Burnard Bunting, M.D.    DATE OF BIRTH:  [birth year]  DATE OF PROCEDURE: DATE OF DISCHARGE:                              OPERATIVE REPORT   PREOPERATIVE DIAGNOSIS:  Right loose total knee arthroplasty.  POSTOPERATIVE DIAGNOSIS:  Right loose total knee arthroplasty.  PROCEDURE:  Revision right total knee arthroplasty, femoral and tibial components.  SURGEON:  Burnard Bunting, M.D.  ASSISTANT:  [name], P.A.  ANESTHESIA:  General.  ESTIMATED BLOOD LOSS:  200.  DRAINS:  None.  INDICATIONS:  [name] is a patient 5 years out total knee replacement now with pain in the knee.  She presents now after failure of conservative management.  Bone scan suggest loosening.  Labs negative for infection.  PROCEDURE IN DETAIL:  The patient was brought to the operating room where general endotracheal anesthetic was induced.  Preop antibiotics administered.  Time-out was called.  Left leg was prescrubbed with alcohol and Betadine, which was allowed to air dry.  Prepped with DuraPrep, draped in usual sterile manner.  Total tourniquet time 2 hours at 300 mmHg followed by 45 minutes, let down.  Then 23 minutes at 300 mmHg for cementation.  [name] was used to cover the operative field.  Leg was elevated, exsanguinated with Esmarch wrap.  Tourniquet was inflated. Anterior approach to knee was made.  Median parapatellar approach was made.  Patella was everted.  Clear fluid was present.  Two culture specimens were sent.  Tissue was also sent for pathologic analysis, which showed no acute inflammation.  Tibial component was loose, it could be tapped out of its cement mantle with a bone tamp.  The bone cement interface was solid.  Femoral component had some less evidence of loosening, but was changed.  Tibial component was removed.   Cement was removed using an osteotome.  Care was taken to maintain the integrity of the tibia.  In a similar fashion, the femur was removed.  Excess cement was removed.  At this time, canal reaming was performed on the tibia followed by the femur.  About 1-2 mm bony resection was made on the tibia, which showed good bone preservation from the extraction.  In a similar fashion, a cleanup cut was made on the femur.  It was decided to use a sleeve for rotational stability in the tibia.  Tibial tray was placed size 2, which was flushed with the bony cut and had good rotational stability with the 29 mm sleeve.  At this time, femoral component was then prepared.  Distal femur was cut with the rotational alignment made at 90 degrees.  Distal femoral cut was 1st made followed by the chamfer cuts and then the box cut.  Again rotational alignment was made by aligning the posterior condyles parallel with the flexion spacer block.  Distal box cut was then made.  Trial component was placed on the femur with a 16 mm stem, which was a press-fit stem.  This gave her reasonable AP and medial lateral fit on  the femur.  With trial components in place and a 17 spacer, the patient had about 3 degrees of hyperextension, full flexion, excellent patellar tracking, good stability to varus valgus stress.  Trial components were removed.  DePuy PC3 component size 2 tibia, 2 femur, 16 press-fit stem on the femur, 30 mm x 13 mm cemented stem on the 20 mm sleeve on the tibia was also placed with a 20 mm spacer.  20 mm spacer gave full extension, full flexion, excellent stability to varus valgus stress, which was slightly improved over the 17-1/2.  At this time, sequence of events concluded letting the tourniquet down after final femoral and tibial preparation. Tourniquet was allowed to be down after thorough irrigation, and then was placed back up for cementation of the components.  Following cementation, tourniquet  was again released, bleeding points were encountered and controlled with electrocautery.  Thorough irrigation was performed and the incision was then closed using interrupted inverted #1 Vicryl suture, 0-Vicryl suture, and 2-0 Vicryl suture followed by skin staples.  Solution of Marcaine morphine finally injected to the knee. [name]'s assistance was required at all times during the case for opening and closing, retraction of neurovascular structures.  Her assistance was a medical necessity.  She was transferred to the recovery room in stable condition.     Burnard Bunting, M.D.     GSD/MEDQ  D:  02/25/2012  T:  02/26/2012  Job:  [id number]

## 2012-02-26 NOTE — Progress Notes (Signed)
Subjective: Pt stable Co paresthesias right foot   Objective: Vital signs in last 24 hours: Temp:  [98 F (36.7 C)-98.7 F (37.1 C)] 98.1 F (36.7 C) (10/02 0558) Pulse Rate:  [61-82] 77  (10/02 0558) Resp:  [8-28] 16  (10/02 0558) BP: (91-112)/(37-76) 111/76 mmHg (10/02 0558) SpO2:  [93 %-100 %] 98 % (10/02 0558)  Intake/Output from previous day: 10/01 0701 - 10/02 0700 In: 4090 [P.O.:240; I.V.:3750; IV Piggyback:100] Out: 1075 [Urine:775; Blood:300] Intake/Output this shift:    Exam:  Neurovascular intact Intact pulses distally Dorsiflexion/Plantar flexion intact  Labs: No results found for this basename: HGB:5 in the last 72 hours No results found for this basename: WBC:2,RBC:2,HCT:2,PLT:2 in the last 72 hours No results found for this basename: NA:2,K:2,CL:2,CO2:2,BUN:2,CREATININE:2,GLUCOSE:2,CALCIUM:2 in the last 72 hours No results found for this basename: LABPT:2,INR:2 in the last 72 hours  Assessment/Plan: Mobilize today - cpm - PT oob - paresthesias from tourniquet compts soft   [name] 02/26/2012, 7:18 AM

## 2012-02-26 NOTE — Progress Notes (Signed)
Physical Therapy Treatment Patient Details Name: [name] MRN: [id number] DOB: 1956/04/12 Today's Date: 02/26/2012 Time: 6578-4696 PT Time Calculation (min): 14 min  PT Assessment / Plan / Recommendation Comments on Treatment Session  Pt tolerating treatment well and demonstrating improvement from morning session.  Sensation beginning to return to RLE. PT will continue to follow.    Follow Up Recommendations  Home health PT;Supervision - Intermittent    Barriers to Discharge        Equipment Recommendations  Rolling walker with 5" wheels    Recommendations for Other Services    Frequency 7X/week   Plan Discharge plan remains appropriate;Frequency remains appropriate    Precautions / Restrictions Precautions Precautions: Knee Required Braces or Orthoses: Knee Immobilizer - Right Knee Immobilizer - Right: On except when in CPM Restrictions Weight Bearing Restrictions: Yes RLE Weight Bearing: Weight bearing as tolerated   Pertinent Vitals/Pain 7/10 right knee pain, meds just given    Mobility  Bed Mobility Bed Mobility: Sit to Supine Sit to Supine: 4: Min assist Details for Bed Mobility Assistance: min A to LE's to get into bed Transfers Transfers: Sit to Stand;Stand to Sit Sit to Stand: From bed;4: Min assist Stand to Sit: 4: Min assist;To bed Details for Transfer Assistance: vc's for hand placement and to push through left leg to get up Ambulation/Gait Ambulation/Gait Assistance: 4: Min guard Ambulation Distance (Feet): 20 Feet Assistive device: Rolling walker Ambulation/Gait Assistance Details: pt not dizzy this afternoon but still feeling weak.  vc's for sequence and upright posture Gait Pattern: Step-to pattern;Decreased step length - right;Decreased stance time - right Gait velocity: decreased Stairs: No Wheelchair Mobility Wheelchair Mobility: No    Exercises Total Joint Exercises Ankle Circles/Pumps: AROM;10 reps;Both;Seated Heel Slides: AROM;Right;10  reps;Seated Long Arc Quad: AROM;10 reps;Seated   PT Diagnosis:    PT Problem List:   PT Treatment Interventions:     PT Goals Acute Rehab PT Goals PT Goal Formulation: With patient Time For Goal Achievement: 03/04/12 Potential to Achieve Goals: Good Pt will go Supine/Side to Sit: with modified independence;with HOB 0 degrees PT Goal: Supine/Side to Sit - Progress: Progressing toward goal Pt will go Sit to Supine/Side: with modified independence PT Goal: Sit to Supine/Side - Progress: Progressing toward goal Pt will go Sit to Stand: with modified independence PT Goal: Sit to Stand - Progress: Progressing toward goal Pt will go Stand to Sit: with modified independence PT Goal: Stand to Sit - Progress: Progressing toward goal Pt will Ambulate: >150 feet;with modified independence;with rolling walker PT Goal: Ambulate - Progress: Progressing toward goal Pt will Go Up / Down Stairs: 1-2 stairs;with supervision;with rolling walker Pt will Perform Home Exercise Program: Independently PT Goal: Perform Home Exercise Program - Progress: Progressing toward goal  Visit Information  Last PT Received On: 02/26/12 Assistance Needed: +1    Subjective Data  Subjective: I am starting to be able to feel my right leg a little more Patient Stated Goal: return home   Cognition  Overall Cognitive Status: Appears within functional limits for tasks assessed/performed Arousal/Alertness: Awake/alert Orientation Level: Oriented X4 / Intact Behavior During Session: [facility] for tasks performed    Balance  Balance Balance Assessed: Yes Dynamic Standing Balance Dynamic Standing - Balance Support: Right upper extremity supported;During functional activity Dynamic Standing - Level of Assistance: 5: Stand by assistance  End of Session PT - End of Session Equipment Utilized During Treatment: Right knee immobilizer;Gait belt Activity Tolerance: Patient limited by fatigue Patient left: in bed;in  CPM;with call  bell/phone within reach (CPM 0-40 degrees) Nurse Communication: Mobility status   GP   Lyanne Co, PT  Acute Rehab Services  [phone]   Lyanne Co 02/26/2012, 3:28 PM

## 2012-02-26 NOTE — Progress Notes (Signed)
Physical Therapy Evaluation Patient Details Name: [name] MRN: [id number] DOB: 1956/01/14 Today's Date: 02/26/2012 Time: 6213-0865 PT Time Calculation (min): 34 min  PT Assessment / Plan / Recommendation Clinical Impression  Pt is 55 yo female s/p right TKA revision who is limited today by lack of sensation in the RLE as well as pain and dizziness with initial standing. She will benefit from acute PT to increase strength and ROM of the right knee to increase mobility and independence for d/c home.    PT Assessment  Patient needs continued PT services    Follow Up Recommendations  Home health PT;Supervision - Intermittent    Barriers to Discharge None      Equipment Recommendations  Rolling walker with 5" wheels    Recommendations for Other Services     Frequency 7X/week    Precautions / Restrictions Precautions Precautions: Knee Precaution Comments: reviewed not putting pillow under knee and KI Required Braces or Orthoses: Knee Immobilizer - Right Knee Immobilizer - Right: On except when in CPM Restrictions Weight Bearing Restrictions: Yes RLE Weight Bearing: Weight bearing as tolerated   Pertinent Vitals/Pain 8/10 right knee pain, PCA used VSS      Mobility  Bed Mobility Bed Mobility: Rolling Left;Left Sidelying to Sit;Sitting - Scoot to Edge of Bed Rolling Left: 3: Mod assist;With rail Left Sidelying to Sit: 4: Min assist Sitting - Scoot to Edge of Bed: 4: Min assist Details for Bed Mobility Assistance: mod A for rolling and support of right knee  Transfers Transfers: Sit to Stand;Stand to Sit Sit to Stand: 4: Min assist;From bed;With upper extremity assist Stand to Sit: 4: Min assist;With upper extremity assist;To chair/3-in-1 Details for Transfer Assistance: vc's for hand placement, mild dizziness with initial standing, vc's for anterior wt shift Ambulation/Gait Ambulation/Gait Assistance: 4: Min assist Ambulation Distance (Feet): 5 Feet Assistive device:  Rolling walker Ambulation/Gait Assistance Details: ambulation kept to a minimum due to pain and dizziness.  vc's for sequencing. Gait Pattern: Step-to pattern;Decreased step length - right;Decreased stance time - right Stairs: No Wheelchair Mobility Wheelchair Mobility: No    Shoulder Instructions     Exercises Total Joint Exercises Ankle Circles/Pumps: AROM;Both;20 reps;Seated Quad Sets: AROM;Both;10 reps;Seated   PT Diagnosis: Difficulty walking;Abnormality of gait;Acute pain  PT Problem List: Decreased strength;Decreased range of motion;Decreased activity tolerance;Decreased balance;Decreased mobility;Decreased knowledge of use of DME;Decreased knowledge of precautions;Pain PT Treatment Interventions: DME instruction;Gait training;Stair training;Functional mobility training;Therapeutic activities;Therapeutic exercise;Balance training;Patient/family education;Neuromuscular re-education   PT Goals Acute Rehab PT Goals PT Goal Formulation: With patient Time For Goal Achievement: 03/04/12 Potential to Achieve Goals: Good Pt will go Supine/Side to Sit: with modified independence;with HOB 0 degrees PT Goal: Supine/Side to Sit - Progress: Goal set today Pt will go Sit to Supine/Side: with modified independence PT Goal: Sit to Supine/Side - Progress: Goal set today Pt will go Sit to Stand: with modified independence PT Goal: Sit to Stand - Progress: Goal set today Pt will go Stand to Sit: with modified independence PT Goal: Stand to Sit - Progress: Goal set today Pt will Ambulate: >150 feet;with modified independence;with rolling walker PT Goal: Ambulate - Progress: Goal set today Pt will Go Up / Down Stairs: 1-2 stairs;with supervision;with rolling walker PT Goal: Up/Down Stairs - Progress: Goal set today Pt will Perform Home Exercise Program: Independently PT Goal: Perform Home Exercise Program - Progress: Goal set today  Visit Information  Last PT Received On: 02/26/12 Assistance  Needed: +1    Subjective Data  Subjective: I can't feel my right foot Patient Stated Goal: return home   Prior Functioning  Home Living Lives With: Family;Daughter Available Help at Discharge: Family;Available PRN/intermittently Type of Home: House Home Access: Stairs to enter Entrance Stairs-Number of Steps: 2 Entrance Stairs-Rails: None Home Layout: One level Home Adaptive Equipment: None Additional Comments: Pt lives with her daughter who is in school and will be there for most of the day except class time, and her granddaughter who is 4 Prior Function Level of Independence: Independent Able to Take Stairs?: Yes Driving: Yes Vocation: Part time employment Comments: works in the Pharmacologist at [employer] Communication: No difficulties    Cognition  Overall Cognitive Status: Appears within functional limits for tasks assessed/performed Arousal/Alertness: Awake/alert Orientation Level: Oriented X4 / Intact Behavior During Session: [facility] for tasks performed    Extremity/Trunk Assessment Right Upper Extremity Assessment RUE ROM/Strength/Tone: WFL for tasks assessed RUE Sensation: WFL - Light Touch RUE Coordination: WFL - gross motor Left Upper Extremity Assessment LUE ROM/Strength/Tone: WFL for tasks assessed LUE Sensation: WFL - Light Touch LUE Coordination: WFL - gross motor Right Lower Extremity Assessment RLE ROM/Strength/Tone: Deficits RLE ROM/Strength/Tone Deficits: unable to lift leg or slide it yet, can wiggle toes but cannot feel them. 0/5 quad contraction noted RLE Sensation: Deficits RLE Sensation Deficits: decreased sensation since yesterday RLE Coordination: WFL - gross motor Left Lower Extremity Assessment LLE ROM/Strength/Tone: Within functional levels LLE Sensation: WFL - Light Touch LLE Coordination: WFL - gross motor Trunk Assessment Trunk Assessment: Normal   Balance Balance Balance Assessed: No  End of Session PT - End of  Session Equipment Utilized During Treatment: Gait belt Activity Tolerance: Patient limited by pain;Other (comment) (limited by dizziness) Patient left: in chair;with call bell/phone within reach Nurse Communication: Mobility status CPM Right Knee CPM Right Knee: Off  GP   Lyanne Co, PT  Acute Rehab Services  [phone]   Lyanne Co 02/26/2012, 10:40 AM

## 2012-02-27 ENCOUNTER — Encounter (HOSPITAL_COMMUNITY): Payer: Self-pay | Admitting: Orthopedic Surgery

## 2012-02-27 LAB — PROTIME-INR
INR: 1.72 — ABNORMAL HIGH (ref 0.00–1.49)
Prothrombin Time: 19.6 seconds — ABNORMAL HIGH (ref 11.6–15.2)

## 2012-02-27 LAB — CBC
HCT: 27.3 % — ABNORMAL LOW (ref 36.0–46.0)
MCH: 29 pg (ref 26.0–34.0)
MCHC: 31.5 g/dL (ref 30.0–36.0)
MCV: 91.9 fL (ref 78.0–100.0)
Platelets: 176 10*3/uL (ref 150–400)
RBC: 2.97 MIL/uL — ABNORMAL LOW (ref 3.87–5.11)
RDW: 14 % (ref 11.5–15.5)

## 2012-02-27 LAB — GLUCOSE, CAPILLARY
Glucose-Capillary: 125 mg/dL — ABNORMAL HIGH (ref 70–99)
Glucose-Capillary: 119 mg/dL — ABNORMAL HIGH (ref 70–99)
Glucose-Capillary: 110 mg/dL — ABNORMAL HIGH (ref 70–99)

## 2012-02-27 MED ORDER — WARFARIN SODIUM 1 MG PO TABS
1.0000 mg | ORAL_TABLET | Freq: Once | ORAL | Status: AC
  Administered 2012-02-27: 1 mg via ORAL
  Filled 2012-02-27: qty 1

## 2012-02-27 NOTE — Progress Notes (Signed)
I agree with the following treatment note after reviewing documentation.   [place], Media Pizzini Brynn   OTR/L Pager: [phone] Office: [phone] .   

## 2012-02-27 NOTE — Progress Notes (Signed)
Physical Therapy Progress Note   02/27/12 1500  PT Visit Information  Last PT Received On 02/27/12  Assistance Needed +1  PT Time Calculation  PT Start Time 1403  PT Stop Time 1433  PT Time Calculation (min) 30 min  Subjective Data  Subjective Feeling better this afternoon  Patient Stated Goal To go home tomorrow  Precautions  Precautions Knee  Required Braces or Orthoses Knee Immobilizer - Right  Knee Immobilizer - Right On except when in CPM  Restrictions  Weight Bearing Restrictions Yes  RLE Weight Bearing WBAT  Cognition  Overall Cognitive Status Appears within functional limits for tasks assessed/performed  Arousal/Alertness Awake/alert  Orientation Level Appears intact for tasks assessed  Behavior During Session [facility] for tasks performed  Bed Mobility  Bed Mobility Not assessed  Transfers  Transfers Sit to Stand;Stand to Sit  Sit to Stand 5: Supervision;With upper extremity assist;From chair/3-in-1;With armrests  Stand to Sit 5: Supervision;With upper extremity assist;To chair/3-in-1;With armrests  Details for Transfer Assistance Surpervision for safety  Ambulation/Gait  Ambulation/Gait Assistance 4: Min guard  Ambulation Distance (Feet) 130 Feet  Assistive device Rolling walker  Ambulation/Gait Assistance Details Cues for step sequence and posture  Gait Pattern Step-to pattern;Decreased stride length;Antalgic  Gait velocity decreased  Stairs No  Wheelchair Mobility  Wheelchair Mobility No  PT - End of Session  Equipment Utilized During Treatment Gait belt  Activity Tolerance Patient tolerated treatment well  Patient left in chair;with call bell/phone within reach  Nurse Communication Mobility status  PT - Assessment/Plan  Comments on Treatment Session Pt reported a decrease in pain from this morning and was able to increase ambulation distance.  Plan for next session is to review HEP and answer any questions about home management.  PT Plan Discharge plan remains  appropriate;Frequency remains appropriate  PT Frequency 7X/week  Recommendations for Other Services Other (comment) (None)  Follow Up Recommendations Home health PT;Supervision for mobility/OOB  Equipment Recommended Rolling walker with 5" wheels;Tub/shower bench  Acute Rehab PT Goals  PT Goal Formulation With patient  Time For Goal Achievement 03/04/12  Potential to Achieve Goals Good  Pt will go Sit to Stand with modified independence  PT Goal: Sit to Stand - Progress Progressing toward goal  Pt will go Stand to Sit with modified independence  PT Goal: Stand to Sit - Progress Progressing toward goal  Pt will Ambulate >150 feet;with modified independence;with rolling walker  PT Goal: Ambulate - Progress Progressing toward goal    3-4/10 pain in R LE.  Amy DiTommaso, SPT  [place], [place] DPT [phone]

## 2012-02-27 NOTE — Progress Notes (Signed)
Referral received for SNF. Chart reviewed and CSW has spoken with RNCM who indicates that patient is for DC to home with Home Health and DME.  CSW to sign off. Please re-consult if CSW needs arise.  Dam Ashraf T. Salem Mastrogiovanni, LCSWA  [phone]  

## 2012-02-27 NOTE — Consult Note (Signed)
Pharmacy Consult-Anticoagulation  Pharmacy Consult Coumadin:  56 y.o.female admitted for Loose Right total knee arthroplasty 2 Days Post-Op  S/p Revision R-TKA  on Coumadin for VTE prophylaxis    Current Labs: Hematology  [facility] 02/27/12 0545 02/26/12 0637  HGB 8.6* 9.7*  HCT 27.3* 30.8*  PLT 176 180  LABPROT 19.6* 13.8  INR 1.72* 1.07  CREATININE -- 0.96   Lab Results  Component Value Date   INR 1.72* 02/27/2012   INR 1.07 02/26/2012   INR 0.95 02/20/2012   Estimated Creatinine Clearance: 67.9 ml/min (by C-G formula based on Cr of 0.96).  Assessment:  INR with very rapid rise in INR following 2 doses of Coumadin 7.5 mg.  [Warfarin predictor score = 5].  No bleeding complications observed.  Goal/Plan:  INR goal is 2-3.  Reduce today's dose to Coumadin 1 mg po x 1.  Daily INR's, CBC.  Coumadin discharge education completed.   [employer], Elisha Headland, Pharm.D. 02/27/2012  3:21 PM

## 2012-02-27 NOTE — Progress Notes (Signed)
Physical Therapy Treatment Patient Details Name: [name] MRN: [id number] DOB: 1955/07/28 Today's Date: 02/27/2012 Time: 4098-1191 PT Time Calculation (min): 35 min  PT Assessment / Plan / Recommendation Comments on Treatment Session  Pt c/o pain and paraseia in R LE digits.  MD aware.  Pt increase ambulation and practiced step negotiation.      Follow Up Recommendations  Home health PT;Supervision for mobility/OOB    Barriers to Discharge  None      Equipment Recommendations  Rolling walker with 5" wheels    Recommendations for Other Services Other (comment) (None)  Frequency 7X/week   Plan Discharge plan remains appropriate;Frequency remains appropriate    Precautions / Restrictions Precautions Precautions: Knee Restrictions Weight Bearing Restrictions: Yes RLE Weight Bearing: Weight bearing as tolerated   Pertinent Vitals/Pain Pt reports R LE pain 7/10.    Mobility  Bed Mobility Bed Mobility: Supine to Sit;Sitting - Scoot to Edge of Bed Supine to Sit: 4: Min assist;HOB flat Sitting - Scoot to [place] of Bed: 4: Min assist Details for Bed Mobility Assistance: (A) supporting R LE OOB.  Cues for safety Transfers Transfers: Sit to Stand;Stand to Sit Sit to Stand: 4: Min guard;With upper extremity assist;From bed;From chair/3-in-1 Stand to Sit: 4: Min guard;To chair/3-in-1 Details for Transfer Assistance: Cues for safety and hand placement Ambulation/Gait Ambulation/Gait Assistance: 4: Min guard Ambulation Distance (Feet): 110 Feet Assistive device: Rolling walker Ambulation/Gait Assistance Details: Cues for proper step sequence and safety Gait Pattern: Step-to pattern;Decreased stride length;Antalgic Gait velocity: decreased Stairs: Yes Stairs Assistance: 4: Min assist Stairs Assistance Details (indicate cue type and reason): (A) RW placement and cues for proper technique and hand placement Stair Management Technique: No rails;Backwards;With walker Number of  Stairs: 1  ((2 trials)) Wheelchair Mobility Wheelchair Mobility: No    Exercises Total Joint Exercises Quad Sets: AROM;Right;5 reps Heel Slides: AAROM;Right;5 reps   PT Diagnosis:    PT Problem List:   PT Treatment Interventions:     PT Goals Acute Rehab PT Goals PT Goal Formulation: With patient Time For Goal Achievement: 03/04/12 Potential to Achieve Goals: Good Pt will go Supine/Side to Sit: with modified independence;with HOB 0 degrees PT Goal: Supine/Side to Sit - Progress: Progressing toward goal Pt will go Sit to Stand: with modified independence PT Goal: Sit to Stand - Progress: Progressing toward goal Pt will go Stand to Sit: with modified independence PT Goal: Stand to Sit - Progress: Progressing toward goal Pt will Ambulate: >150 feet;with modified independence;with rolling walker PT Goal: Ambulate - Progress: Progressing toward goal Pt will Go Up / Down Stairs: 1-2 stairs;with supervision;with rolling walker PT Goal: Up/Down Stairs - Progress: Met Pt will Perform Home Exercise Program: Independently PT Goal: Perform Home Exercise Program - Progress: Progressing toward goal  Visit Information  Last PT Received On: 02/27/12 Assistance Needed: +1    Subjective Data  Subjective: My leg feels a lot better without that bandage Patient Stated Goal: To go home   Cognition  Overall Cognitive Status: Appears within functional limits for tasks assessed/performed Arousal/Alertness: Awake/alert Orientation Level: Appears intact for tasks assessed Behavior During Session: [facility] for tasks performed    Balance     End of Session PT - End of Session Equipment Utilized During Treatment: Gait belt Activity Tolerance: Patient tolerated treatment well Patient left: in chair;with call bell/phone within reach Nurse Communication: Mobility status   GP     DITOMMASO, AMY 02/27/2012, 10:03 AM  Jake Shark, PT DPT [phone]

## 2012-02-27 NOTE — Progress Notes (Signed)
Subjective: Pt stable - parahesias decreased   Objective: Vital signs in last 24 hours: Temp:  [98.2 F (36.8 C)-99.4 F (37.4 C)] 98.4 F (36.9 C) (10/03 0442) Pulse Rate:  [80-93] 86  (10/03 0442) Resp:  [16-18] 16  (10/03 0442) BP: (109-114)/(55-60) 112/55 mmHg (10/03 0442) SpO2:  [93 %-98 %] 96 % (10/03 0442) Weight:  [81.9 kg (180 lb 8.9 oz)] 81.9 kg (180 lb 8.9 oz) (10/02 1143)  Intake/Output from previous day: 10/02 0701 - 10/03 0700 In: 240 [P.O.:240] Out: -  Intake/Output this shift:    Exam:  Neurovascular intact Sensation intact distally Intact pulses distally Dorsiflexion/Plantar flexion intact  Labs:  Basename 02/26/12 0637  HGB 9.7*    Basename 02/26/12 0637  WBC 10.7*  RBC 3.33*  HCT 30.8*  PLT 180    Basename 02/26/12 0637  NA 135  K 4.3  CL 99  CO2 27  BUN 13  CREATININE 0.96  GLUCOSE 169*  CALCIUM 8.7    Basename 02/26/12 0637  LABPT --  INR 1.07    Assessment/Plan: Doing well - possible dc am   Anishka Bushard SCOTT 02/27/2012, 8:18 AM

## 2012-02-27 NOTE — Progress Notes (Signed)
Occupational Therapy Evaluation Patient Details Name: [name] MRN: [id number] DOB: [birth year] Today's Date: 02/27/2012 Time: 4098-1191 OT Time Calculation (min): 27 min  OT Assessment / Plan / Recommendation Clinical Impression  Pt. 56 yo female s/p right TKA. Pt. educated on how to don/doff knee immobilizer. Pt. completed sponge bath of UB and LB with set up (A). Pt. would benefit from OT acutely to address tub transfer and LB dressing. Pt. Hgb 8.6 this session and was feeling very sleepy.     OT Assessment  Patient needs continued OT Services    Follow Up Recommendations  Home health OT;Supervision - Intermittent    Barriers to Discharge      Equipment Recommendations  Rolling walker with 5" wheels;Tub/shower bench    Recommendations for Other Services    Frequency  Min 2X/week    Precautions / Restrictions Precautions Precautions: Knee Restrictions Weight Bearing Restrictions: Yes RLE Weight Bearing: Weight bearing as tolerated   Pertinent Vitals/Pain Pt. Reports 8/10. RN notified and pain meds administered.     ADL  Grooming: Performed;Wash/dry hands;Wash/dry face;Supervision/safety Where Assessed - Grooming: Unsupported standing Upper Body Bathing: Performed;Set up Where Assessed - Upper Body Bathing: Supported sitting Lower Body Bathing: Performed;Set up Where Assessed - Lower Body Bathing: Supported sitting;Lean right and/or left Upper Body Dressing: Simulated;Supervision/safety Where Assessed - Upper Body Dressing: Supported sit to Pharmacist, hospital: Research scientist (life sciences) Method: Sit to Barista: Raised toilet seat with arms (or 3-in-1 over toilet) Toileting - Clothing Manipulation and Hygiene: Performed;Independent Where Assessed - Toileting Clothing Manipulation and Hygiene: Sit to stand from 3-in-1 or toilet Equipment Used: Gait belt;Knee Immobilizer;Rolling walker Transfers/Ambulation Related to ADLs:  Pt. supervision during transfers and ambulation in room. VC's for safest hand placement when sit<>stand and stand <>sit transfers. Pt. tends to grab on to the RW.  ADL Comments: Pt. completed grooming tasks with supervision standing at sink level. Pt. able to sponge bath LB with set up seated in chair leaning right and left. Pt. to practice tub transfer tomorrow.      OT Diagnosis: Generalized weakness;Acute pain  OT Problem List: Decreased activity tolerance;Decreased safety awareness;Decreased knowledge of precautions;Pain OT Treatment Interventions: Self-care/ADL training;Therapeutic exercise;DME and/or AE instruction;Patient/family education   OT Goals Acute Rehab OT Goals OT Goal Formulation: With patient Time For Goal Achievement: 03/12/12 Potential to Achieve Goals: Good ADL Goals Pt Will Perform Lower Body Dressing: with set-up;Sit to stand from chair ADL Goal: Lower Body Dressing - Progress: Goal set today Pt Will Perform Tub/Shower Transfer: with supervision;Ambulation;Transfer tub bench ADL Goal: Tub/Shower Transfer - Progress: Goal set today  Visit Information  Last OT Received On: 02/27/12 Assistance Needed: +1    Subjective Data  Subjective: I am really bruised up and sleepier than usual. Patient Stated Goal: To return home    Prior Functioning     Home Living Lives With: Family;Daughter Available Help at Discharge: Family;Available PRN/intermittently Type of Home: House Home Access: Stairs to enter [employer] of Steps: 2 Entrance Stairs-Rails: None Home Layout: One level Bathroom Shower/Tub: Forensic scientist: Standard Bathroom Accessibility: Yes How Accessible: Accessible via walker Home Adaptive Equipment: None Additional Comments: Pt lives with her daughter who is in school and will be there for most of the day except class time, and her granddaughter who is 4 Prior Function Level of Independence: Independent Able to  Take Stairs?: Yes Driving: Yes Vocation: Part time employment Communication Communication: No difficulties Dominant Hand: Right  Vision/Perception     Cognition  Overall Cognitive Status: Appears within functional limits for tasks assessed/performed Arousal/Alertness: Awake/alert Orientation Level: Appears intact for tasks assessed Behavior During Session: [facility] for tasks performed    Extremity/Trunk Assessment Right Upper Extremity Assessment RUE ROM/Strength/Tone: [facility] for tasks assessed Left Upper Extremity Assessment LUE ROM/Strength/Tone: WFL for tasks assessed Trunk Assessment Trunk Assessment: Normal     Mobility Bed Mobility Bed Mobility: Not assessed Supine to Sit: 4: Min assist;HOB flat Sitting - Scoot to Edge of Bed: 4: Min assist Details for Bed Mobility Assistance: (A) supporting R LE OOB.  Cues for safety Transfers Transfers: Stand to Sit;Sit to Stand Sit to Stand: 5: Supervision;With upper extremity assist;From chair/3-in-1;With armrests Stand to Sit: 5: Supervision;With upper extremity assist;To chair/3-in-1;With armrests Details for Transfer Assistance: Verbal cues for safest hand placement     Shoulder Instructions     Exercise Total Joint Exercises Quad Sets: AROM;Right;5 reps Heel Slides: AAROM;Right;5 reps   Balance     End of Session OT - End of Session Equipment Utilized During Treatment: Gait belt;Right knee immobilizer Activity Tolerance: Patient limited by fatigue Patient left: in chair;with call bell/phone within reach;with nursing in room Nurse Communication: Patient requests pain meds;Mobility status  GO     Cleora Fleet 02/27/2012, 11:42 AM

## 2012-02-28 LAB — CBC
MCHC: 31 g/dL (ref 30.0–36.0)
Platelets: 174 10*3/uL (ref 150–400)
RDW: 14.3 % (ref 11.5–15.5)
WBC: 8 10*3/uL (ref 4.0–10.5)

## 2012-02-28 LAB — GLUCOSE, CAPILLARY
Glucose-Capillary: 107 mg/dL — ABNORMAL HIGH (ref 70–99)
Glucose-Capillary: 117 mg/dL — ABNORMAL HIGH (ref 70–99)

## 2012-02-28 LAB — PROTIME-INR
INR: 1.48 (ref 0.00–1.49)
Prothrombin Time: 17.5 seconds — ABNORMAL HIGH (ref 11.6–15.2)

## 2012-02-28 MED ORDER — METHOCARBAMOL 500 MG PO TABS: 500.0000 mg | ORAL_TABLET | Freq: Three times a day (TID) | ORAL | Status: DC

## 2012-02-28 MED ORDER — WARFARIN SODIUM 4 MG PO TABS: 4.0000 mg | ORAL_TABLET | Freq: Every day | ORAL | Status: DC

## 2012-02-28 MED ORDER — OXYCODONE HCL 10 MG PO TB12: 10.0000 mg | ORAL_TABLET | Freq: Two times a day (BID) | ORAL | Status: DC

## 2012-02-28 MED ORDER — OXYCODONE HCL 5 MG PO TABS: 5.0000 mg | ORAL_TABLET | ORAL | Status: DC | PRN

## 2012-02-28 NOTE — Progress Notes (Signed)
I agree with the following treatment note after reviewing documentation.   Johnston, Larell Baney Brynn   OTR/L Pager: [phone] Office: [phone] .   

## 2012-02-28 NOTE — Progress Notes (Signed)
Subjective: Pt stable - pain controlled - walking in halls   Objective: Vital signs in last 24 hours: Temp:  [98 F (36.7 C)-98.3 F (36.8 C)] 98 F (36.7 C) (10/04 0630) Pulse Rate:  [75-83] 77  (10/04 0630) Resp:  [16-18] 18  (10/04 0630) BP: (97-116)/(52-62) 116/61 mmHg (10/04 0630) SpO2:  [99 %-100 %] 100 % (10/04 0630)  Intake/Output from previous day: 10/03 0701 - 10/04 0700 In: 240 [P.O.:240] Out: -  Intake/Output this shift:    Exam:  Neurovascular intact Sensation intact distally Intact pulses distally Dorsiflexion/Plantar flexion intact  Labs:  Basename 02/27/12 0545 02/26/12 0637  HGB 8.6* 9.7*    Basename 02/27/12 0545 02/26/12 0637  WBC 9.1 10.7*  RBC 2.97* 3.33*  HCT 27.3* 30.8*  PLT 176 180    Basename 02/26/12 0637  NA 135  K 4.3  CL 99  CO2 27  BUN 13  CREATININE 0.96  GLUCOSE 169*  CALCIUM 8.7    Basename 02/27/12 0545 02/26/12 0637  LABPT -- --  INR 1.72* 1.07    Assessment/Plan: Dc today - incision ok   [name] SCOTT 02/28/2012, 6:44 AM

## 2012-02-28 NOTE — Progress Notes (Signed)
Occupational Therapy Treatment Patient Details Name: [name] MRN: [id number] DOB: [birth year] Today's Date: 02/28/2012 Time: 1478-2956 OT Time Calculation (min): 23 min  OT Assessment / Plan / Recommendation Comments on Treatment Session Pt. did very well this session and is ready for d/c home. Pt. feels confident in tub transfer and is able to dress her UB and LB with increased time.     Follow Up Recommendations  Home health OT;Supervision - Intermittent    Barriers to Discharge       Equipment Recommendations  Rolling walker with 5" wheels;Tub/shower bench    Recommendations for Other Services    Frequency Min 2X/week   Plan All goals met and education completed, patient discharged from OT services    Precautions / Restrictions Precautions Precautions: Knee Required Braces or Orthoses: Knee Immobilizer - Right Knee Immobilizer - Right: On except when in CPM Restrictions Weight Bearing Restrictions: Yes RLE Weight Bearing: Weight bearing as tolerated   Pertinent Vitals/Pain No pain reported    ADL  Lower Body Dressing: Performed;Modified independent Where Assessed - Lower Body Dressing: Unsupported sit to stand Tub/Shower Transfer: Performed;Supervision/safety Tub/Shower Transfer Method: Anterior-posterior Tub/Shower Transfer Equipment: Transfer tub bench Transfers/Ambulation Related to ADLs: Pt. able to transfer with supervision for safety  ADL Comments: Pt. completed tub bench transfer with supervsion for safety and verbal cues for correct sequence. Pt. educated on proper dressing technique for LB and combpleted UB and LOB dressing with modified independence.     OT Diagnosis:    OT Problem List:   OT Treatment Interventions:     OT Goals Acute Rehab OT Goals OT Goal Formulation: With patient Time For Goal Achievement: 03/12/12 Potential to Achieve Goals: Good ADL Goals Pt Will Perform Lower Body Dressing: with set-up;Sit to stand from chair ADL Goal: Lower  Body Dressing - Progress: Met Pt Will Perform Tub/Shower Transfer: with supervision;Ambulation;Transfer tub bench ADL Goal: Tub/Shower Transfer - Progress: Met  Visit Information  Last OT Received On: 02/28/12 Assistance Needed: +1    Subjective Data      Prior Functioning       Cognition  Overall Cognitive Status: Appears within functional limits for tasks assessed/performed Arousal/Alertness: Awake/alert Orientation Level: Appears intact for tasks assessed Behavior During Session: [facility] for tasks performed    Mobility  Bed Mobility Bed Mobility: Sit to Supine Supine to Sit: 7: Independent Details for Bed Mobility Assistance: Pt able to manage R LE independently.  Transfers Transfers: Sit to Stand;Stand to Sit Sit to Stand: 6: Modified independent (Device/Increase time);With upper extremity assist;From chair/3-in-1 Stand to Sit: 6: Modified independent (Device/Increase time);With armrests;To chair/3-in-1;With upper extremity assist Details for Transfer Assistance: Pt. required no verbal cues during transfers, demonstrated correct sequencing and hand placement.        Exercises  Total Joint Exercises Ankle Circles/Pumps: AROM;10 reps;Both;Seated Quad Sets: AROM;Right;5 reps Heel Slides: AAROM;Right;5 reps Straight Leg Raises: 5 reps;Right;Strengthening;AAROM Long Arc Quad: 10 reps;AROM;Right;Seated Knee Flexion: Right;AAROM;Other reps (comment) (10 second hold x 3 then rest.  ) Goniometric ROM: 0-82 degrees AAROM in R knee    Balance     End of Session OT - End of Session Equipment Utilized During Treatment: Gait belt;Right knee immobilizer Activity Tolerance: Patient tolerated treatment well Patient left: in chair;with call bell/phone within reach;with family/visitor present Nurse Communication: Mobility status  GO     Cleora Fleet 02/28/2012, 12:17 PM

## 2012-02-28 NOTE — Progress Notes (Signed)
I agree with the following treatment note after reviewing documentation.   Johnston, Floy Riegler Brynn   OTR/L Pager: [phone] Office: [phone] .   

## 2012-02-28 NOTE — Progress Notes (Signed)
Patient discharged to home, all discharge instructions including prescriptions discussed with patient.  Patient verbalized understanding, no questions or concerns voiced.

## 2012-02-28 NOTE — Progress Notes (Signed)
Occupational Therapy Discharge Patient Details Name: [name] MRN: [id number] DOB: 08/07/1955 Today's Date: 02/28/2012 Time: 4098-1191 OT Time Calculation (min): 23 min  Patient discharged from OT services secondary to Pt. is modified independent for LB dressing and is confident with tub transfers using a tub bench. Pt. will have daughter to (A) once home. Pt. should be safe to d/c home at this level.  Please see latest therapy progress note for current level of functioning and progress toward goals.    Progress and discharge plan discussed with patient and/or caregiver: Patient/Caregiver agrees with plan  GO     Cleora Fleet 02/28/2012, 12:17 PM

## 2012-02-28 NOTE — Evaluation (Deleted)
Physical Therapy Evaluation Patient Details Name: [name] MRN: [id number] DOB: November 30, 1955 Today's Date: 02/28/2012 Time: 4098-1191 PT Time Calculation (min): 51 min  PT Assessment / Plan / Recommendation Clinical Impression       PT Assessment       Follow Up Recommendations  Home health PT;Supervision for mobility/OOB    Barriers to Discharge        Equipment Recommendations  Rolling walker with 5" wheels;Tub/shower bench    Recommendations for Other Services Other (comment)   Frequency 7X/week    Precautions / Restrictions Precautions Precautions: Knee Required Braces or Orthoses: Knee Immobilizer - Right Knee Immobilizer - Right: On except when in CPM Restrictions Weight Bearing Restrictions: Yes RLE Weight Bearing: Weight bearing as tolerated   Pertinent Vitals/Pain Pain 6-7/10 pt medicated prior to session.  Will apply Ice to pt knee after pt returns to room. Pt is currently in Rehab gym with OT.       Mobility  Bed Mobility Bed Mobility: Sit to Supine Supine to Sit: 7: Independent Details for Bed Mobility Assistance: Pt able to manage R LE independently.  Transfers Transfers: Sit to Stand;Stand to Sit Sit to Stand: 6: Modified independent (Device/Increase time);From bed;With upper extremity assist Stand to Sit: 6: Modified independent (Device/Increase time);To chair/3-in-1;With upper extremity assist Details for Transfer Assistance: Pt demonstrates safety with transfer.  Ambulation/Gait Ambulation/Gait Assistance: 5: Supervision Ambulation Distance (Feet): 150 Feet Assistive device: Rolling walker Ambulation/Gait Assistance Details: Instructed pt in reciprocal gait with RW and KI.   Gait Pattern: Step-to pattern;Step-through pattern;Decreased stride length Gait velocity: decreased Stairs: No    Shoulder Instructions     Exercises Total Joint Exercises Ankle Circles/Pumps: AROM;10 reps;Both;Seated Quad Sets: AROM;Right;5 reps Heel Slides:  AAROM;Right;5 reps Straight Leg Raises: 5 reps;Right;Strengthening;AAROM Long Arc Quad: 10 reps;AROM;Right;Seated Knee Flexion: Right;AAROM;Other reps (comment) (10 second hold x 3 then rest.  ) Goniometric ROM: 0-82 degrees AAROM in R knee    PT Diagnosis:    PT Problem List:   PT Treatment Interventions:     PT Goals Acute Rehab PT Goals PT Goal Formulation: With patient Time For Goal Achievement: 03/04/12 Potential to Achieve Goals: Good Pt will go Supine/Side to Sit: with modified independence;with HOB 0 degrees PT Goal: Supine/Side to Sit - Progress: Met Pt will go Sit to Supine/Side: with modified independence Pt will go Sit to Stand: with modified independence PT Goal: Sit to Stand - Progress: Met Pt will go Stand to Sit: with modified independence PT Goal: Stand to Sit - Progress: Met Pt will Ambulate: >150 feet;with modified independence;with rolling walker PT Goal: Ambulate - Progress: Partly met Pt will Go Up / Down Stairs: 1-2 stairs;with supervision;with rolling walker Pt will Perform Home Exercise Program: Independently PT Goal: Perform Home Exercise Program - Progress: Progressing toward goal  Visit Information  Last PT Received On: 02/28/12 Assistance Needed: +1    Subjective Data  Subjective: It hurts today.    Prior Functioning       Cognition  Overall Cognitive Status: Appears within functional limits for tasks assessed/performed Arousal/Alertness: Awake/alert Orientation Level: Appears intact for tasks assessed Behavior During Session: [facility] for tasks performed    Extremity/Trunk Assessment     Balance    End of Session PT - End of Session Equipment Utilized During Treatment: Gait belt Activity Tolerance: Patient tolerated treatment well Patient left: in chair;with call bell/phone within reach Nurse Communication: Mobility status  GP     Hedy Garro 02/28/2012, 11:36 AM  Theron Arista  L. Jaleya Pebley DPT [phone]

## 2012-02-28 NOTE — Progress Notes (Signed)
PT PROGRESS NOTE  02/28/12 1200  PT Visit Information  Last PT Received On 02/28/12  PT Time Calculation  PT Start Time 1210  PT Stop Time 1222  PT Time Calculation (min) 12 min  Precautions  Precautions Knee  Required Braces or Orthoses Knee Immobilizer - Right  Knee Immobilizer - Right On except when in CPM  Restrictions  Weight Bearing Restrictions Yes  RLE Weight Bearing WBAT  Cognition  Overall Cognitive Status Appears within functional limits for tasks assessed/performed  Arousal/Alertness Awake/alert  Orientation Level Appears intact for tasks assessed  Behavior During Session [facility] for tasks performed  Bed Mobility  Bed Mobility Not assessed  Transfers  Transfers Not assessed  Ambulation/Gait  Ambulation/Gait Assistance Not tested (comment)  Total Joint Exercises  Ankle Circles/Pumps AROM;10 reps;Both;Seated  Quad Sets AROM;Right;5 reps  Short Arc [employer] AROM;5 reps  Heel Slides AAROM;Right;5 reps  PT - End of Session  Activity Tolerance Patient tolerated treatment well  Patient left in chair;with call bell/phone within reach  Nurse Communication Mobility status  PT - Assessment/Plan  Comments on Treatment Session Applied ice pack to pt's knee for pain relief.  Reviewed HEP and rehab process with pt.    PT Plan All goals met and education completed, patient dischaged from PT services  PT Frequency 7X/week  Equipment Recommended Rolling walker with 5" wheels;Tub/shower bench  Acute Rehab PT Goals  PT Goal Formulation With patient  Pt will Go Up / Down Stairs 1-2 stairs;with supervision;with rolling walker  PT Goal: Up/Down Stairs - Progress Met  Pt will Perform Home Exercise Program Independently  PT Goal: Perform Home Exercise Program - Progress Met  PT General Charges  $$ ACUTE PT VISIT 1 Procedure  PT Treatments  $Therapeutic Exercise 8-22 mins   [name] L. [name] DPT [phone]

## 2012-02-28 NOTE — Progress Notes (Signed)
Physical Therapy Treatment Patient Details Name: [name] MRN: [id number] DOB: [birth year] Today's Date: 02/28/2012 Time: 5621-3086 PT Time Calculation (min): 51 min  PT Assessment / Plan / Recommendation Comments on Treatment Session       Follow Up Recommendations  Home health PT;Supervision for mobility/OOB    Barriers to Discharge        Equipment Recommendations  Rolling walker with 5" wheels;Tub/shower bench    Recommendations for Other Services Other (comment)  Frequency 7X/week   Plan Discharge plan remains appropriate;Frequency remains appropriate    Precautions / Restrictions Precautions Precautions: Knee Required Braces or Orthoses: Knee Immobilizer - Right Knee Immobilizer - Right: On except when in CPM Restrictions Weight Bearing Restrictions: Yes RLE Weight Bearing: Weight bearing as tolerated   Pertinent Vitals/Pain Pt reports 6-7/10 pain in R knee.  Will have nursing apply ice once pt is back in her room.  Pt is currently in rehab gym with OT.      Mobility  Bed Mobility Bed Mobility: Sit to Supine Supine to Sit: 7: Independent Details for Bed Mobility Assistance: Pt able to manage R LE independently.  Transfers Transfers: Sit to Stand;Stand to Sit Sit to Stand: 6: Modified independent (Device/Increase time);From bed;With upper extremity assist Stand to Sit: 6: Modified independent (Device/Increase time);To chair/3-in-1;With upper extremity assist Details for Transfer Assistance: Pt demonstrates safety with transfer.  Ambulation/Gait Ambulation/Gait Assistance: 5: Supervision Ambulation Distance (Feet): 150 Feet Assistive device: Rolling walker Ambulation/Gait Assistance Details: Instructed pt in reciprocal gait with RW and KI.   Gait Pattern: Step-to pattern;Step-through pattern;Decreased stride length Gait velocity: decreased Stairs: No    Exercises Total Joint Exercises Ankle Circles/Pumps: AROM;10 reps;Both;Seated Quad Sets: AROM;Right;5  reps Heel Slides: AAROM;Right;5 reps Straight Leg Raises: 5 reps;Right;Strengthening;AAROM Long Arc Quad: 10 reps;AROM;Right;Seated Knee Flexion: Right;AAROM;Other reps (comment) (10 second hold x 3 then rest.  ) Goniometric ROM: 0-82 degrees AAROM in R knee    PT Diagnosis:    PT Problem List:   PT Treatment Interventions:     PT Goals Acute Rehab PT Goals PT Goal Formulation: With patient Time For Goal Achievement: 03/04/12 Potential to Achieve Goals: Good Pt will go Supine/Side to Sit: with modified independence;with HOB 0 degrees PT Goal: Supine/Side to Sit - Progress: Met Pt will go Sit to Supine/Side: with modified independence Pt will go Sit to Stand: with modified independence PT Goal: Sit to Stand - Progress: Met Pt will go Stand to Sit: with modified independence PT Goal: Stand to Sit - Progress: Met Pt will Ambulate: >150 feet;with modified independence;with rolling walker PT Goal: Ambulate - Progress: Partly met Pt will Go Up / Down Stairs: 1-2 stairs;with supervision;with rolling walker Pt will Perform Home Exercise Program: Independently PT Goal: Perform Home Exercise Program - Progress: Progressing toward goal  Visit Information  Last PT Received On: 02/28/12 Assistance Needed: +1    Subjective Data  Subjective: It hurts today.    Cognition  Overall Cognitive Status: Appears within functional limits for tasks assessed/performed Arousal/Alertness: Awake/alert Orientation Level: Appears intact for tasks assessed Behavior During Session: [facility] for tasks performed    Balance     End of Session PT - End of Session Equipment Utilized During Treatment: Gait belt Activity Tolerance: Patient tolerated treatment well Patient left: in chair;with call bell/phone within reach Nurse Communication: Mobility status   GP     [name] 02/28/2012, 11:38 AM Theron Arista L. Brittanny Levenhagen DPT [phone]

## 2012-02-29 LAB — BODY FLUID CULTURE: Culture: NO GROWTH

## 2012-03-01 LAB — ANAEROBIC CULTURE

## 2012-03-05 NOTE — Discharge Summary (Signed)
Physician Discharge Summary  Patient ID: [name] MRN: [id number] DOB/AGE: April 20, 1956 56 y.o.  Admit date: 02/25/2012 Discharge date: 02/28/2012  Admission Diagnoses:  Loose TKA  Discharge Diagnoses:  Same  Surgeries: Procedure(s): TOTAL KNEE REVISION on 02/25/2012   Consultants:    Discharged Condition: Stable  Hospital Course: [name] is an 56 y.o. female who was admitted 02/25/2012 with a chief complaint of right knee pain, and found to have a diagnosis of loose TKA.  They were brought to the operating room on 02/25/2012 and underwent the above named procedures.Did well and was ambulating in halls at time of d/c . Coumadin for dvt prophylaxis - incision ok at dc   Antibiotics given:  Anti-infectives     Start     Dose/Rate Route Frequency Ordered Stop   02/25/12 1600   ceFAZolin (ANCEF) IVPB 1 g/50 mL premix        1 g 100 mL/hr over 30 Minutes Intravenous Every 6 hours 02/25/12 1539 02/25/12 2229   02/25/12 1116   cefUROXime (ZINACEF) injection  Status:  Discontinued          As needed 02/25/12 1116 02/25/12 1206   02/24/12 1419   ceFAZolin (ANCEF) IVPB 2 g/50 mL premix        2 g 100 mL/hr over 30 Minutes Intravenous 60 min pre-op 02/24/12 1419 02/25/12 0755        .  Recent vital signs:  Filed Vitals:   02/28/12 0630  BP: 116/61  Pulse: 77  Temp: 98 F (36.7 C)  Resp: 18    Recent laboratory studies:  Results for orders placed during the hospital encounter of 02/25/12  GLUCOSE, CAPILLARY      Component Value Range   Glucose-Capillary 132 (*) 70 - 99 mg/dL  GLUCOSE, CAPILLARY      Component Value Range   Glucose-Capillary 152 (*) 70 - 99 mg/dL   Comment 1 Notify RN    ANAEROBIC CULTURE      Component Value Range   Specimen Description SYNOVIAL FLUID KNEE RIGHT     Special Requests FLUID ON SWAB NO 1 PT ON ANCEF     Gram Stain       Value: NO WBC SEEN     NO ORGANISMS SEEN   Culture NO ANAEROBES ISOLATED     Report Status 03/01/2012 FINAL     BODY FLUID CULTURE      Component Value Range   Specimen Description SYNOVIAL FLUID KNEE RIGHT     Special Requests FLUID ON SWAB NO 1 PT ON ANCEF     Gram Stain       Value: NO WBC SEEN     NO ORGANISMS SEEN   Culture NO GROWTH 3 DAYS     Report Status 02/29/2012 FINAL    ANAEROBIC CULTURE      Component Value Range   Specimen Description SYNOVIAL FLUID RIGHT KNEE     Special Requests FLUID ON SWAB NO 2 PT ON ANCEF     Gram Stain       Value: NO WBC SEEN     NO ORGANISMS SEEN   Culture NO ANAEROBES ISOLATED     Report Status 03/01/2012 FINAL    BODY FLUID CULTURE      Component Value Range   Specimen Description SYNOVIAL FLUID RIGHT KNEE     Special Requests FLUID ON SWAB NO 2 PT ON ANCEF     Gram Stain  Value: RARE WBC PRESENT,BOTH PMN AND MONONUCLEAR     NO ORGANISMS SEEN   Culture NO GROWTH 3 DAYS     Report Status 02/29/2012 FINAL    GLUCOSE, CAPILLARY      Component Value Range   Glucose-Capillary 131 (*) 70 - 99 mg/dL  PROTIME-INR      Component Value Range   Prothrombin Time 13.8  11.6 - 15.2 seconds   INR 1.07  0.00 - 1.49  CBC      Component Value Range   WBC 10.7 (*) 4.0 - 10.5 K/uL   RBC 3.33 (*) 3.87 - 5.11 MIL/uL   Hemoglobin 9.7 (*) 12.0 - 15.0 g/dL   HCT 96.0 (*) 45.4 - 09.8 %   MCV 92.5  78.0 - 100.0 fL   MCH 29.1  26.0 - 34.0 pg   MCHC 31.5  30.0 - 36.0 g/dL   RDW 11.9  14.7 - 82.9 %   Platelets 180  150 - 400 K/uL  BASIC METABOLIC PANEL      Component Value Range   Sodium 135  135 - 145 mEq/L   Potassium 4.3  3.5 - 5.1 mEq/L   Chloride 99  96 - 112 mEq/L   CO2 27  19 - 32 mEq/L   Glucose, Bld 169 (*) 70 - 99 mg/dL   BUN 13  6 - 23 mg/dL   Creatinine, Ser 5.62  0.50 - 1.10 mg/dL   Calcium 8.7  8.4 - 13.0 mg/dL   GFR calc non Af Amer 66 (*) >90 mL/min   GFR calc Af Amer 76 (*) >90 mL/min  GLUCOSE, CAPILLARY      Component Value Range   Glucose-Capillary 179 (*) 70 - 99 mg/dL  GLUCOSE, CAPILLARY      Component Value Range    Glucose-Capillary 145 (*) 70 - 99 mg/dL  GLUCOSE, CAPILLARY      Component Value Range   Glucose-Capillary 157 (*) 70 - 99 mg/dL  GLUCOSE, CAPILLARY      Component Value Range   Glucose-Capillary 149 (*) 70 - 99 mg/dL   Comment 1 Documented in Chart     Comment 2 Notify RN    PROTIME-INR      Component Value Range   Prothrombin Time 19.6 (*) 11.6 - 15.2 seconds   INR 1.72 (*) 0.00 - 1.49  CBC      Component Value Range   WBC 9.1  4.0 - 10.5 K/uL   RBC 2.97 (*) 3.87 - 5.11 MIL/uL   Hemoglobin 8.6 (*) 12.0 - 15.0 g/dL   HCT 86.5 (*) 78.4 - 69.6 %   MCV 91.9  78.0 - 100.0 fL   MCH 29.0  26.0 - 34.0 pg   MCHC 31.5  30.0 - 36.0 g/dL   RDW 29.5  28.4 - 13.2 %   Platelets 176  150 - 400 K/uL  GLUCOSE, CAPILLARY      Component Value Range   Glucose-Capillary 159 (*) 70 - 99 mg/dL  GLUCOSE, CAPILLARY      Component Value Range   Glucose-Capillary 128 (*) 70 - 99 mg/dL  GLUCOSE, CAPILLARY      Component Value Range   Glucose-Capillary 125 (*) 70 - 99 mg/dL   Comment 1 Documented in Chart     Comment 2 Notify RN    GLUCOSE, CAPILLARY      Component Value Range   Glucose-Capillary 119 (*) 70 - 99 mg/dL   Comment 1 Documented in Chart  Comment 2 Notify RN    PROTIME-INR      Component Value Range   Prothrombin Time 17.5 (*) 11.6 - 15.2 seconds   INR 1.48  0.00 - 1.49  CBC      Component Value Range   WBC 8.0  4.0 - 10.5 K/uL   RBC 3.00 (*) 3.87 - 5.11 MIL/uL   Hemoglobin 8.6 (*) 12.0 - 15.0 g/dL   HCT 16.1 (*) 09.6 - 04.5 %   MCV 92.3  78.0 - 100.0 fL   MCH 28.7  26.0 - 34.0 pg   MCHC 31.0  30.0 - 36.0 g/dL   RDW 40.9  81.1 - 91.4 %   Platelets 174  150 - 400 K/uL  GLUCOSE, CAPILLARY      Component Value Range   Glucose-Capillary 110 (*) 70 - 99 mg/dL  GLUCOSE, CAPILLARY      Component Value Range   Glucose-Capillary 107 (*) 70 - 99 mg/dL  GLUCOSE, CAPILLARY      Component Value Range   Glucose-Capillary 117 (*) 70 - 99 mg/dL   Comment 1 Documented in Chart      Comment 2 Notify RN      Discharge Medications:     Medication List     As of 03/05/2012  7:51 AM    STOP taking these medications         oxyCODONE-acetaminophen 5-325 MG per tablet   Commonly known as: PERCOCET/ROXICET      TAKE these medications         albuterol 108 (90 BASE) MCG/ACT inhaler   Commonly known as: PROVENTIL HFA;VENTOLIN HFA   Inhale 2 puffs into the lungs every 4 (four) hours as needed. For shortness of breath      gabapentin 100 MG capsule   Commonly known as: NEURONTIN   Take 1 capsule (100 mg total) by mouth at bedtime.      methocarbamol 500 MG tablet   Commonly known as: ROBAXIN   Take 1 tablet (500 mg total) by mouth 3 (three) times daily.      oxyCODONE 5 MG immediate release tablet   Commonly known as: Oxy IR/ROXICODONE   Take 1-2 tablets (5-10 mg total) by mouth every 3 (three) hours as needed.      oxyCODONE 10 MG 12 hr tablet   Commonly known as: OXYCONTIN   Take 1 tablet (10 mg total) by mouth every 12 (twelve) hours.      Tamsulosin HCl 0.4 MG Caps   Commonly known as: FLOMAX   Take 1 capsule (0.4 mg total) by mouth daily after breakfast.      warfarin 4 MG tablet   Commonly known as: COUMADIN   Take 1 tablet (4 mg total) by mouth daily.        Diagnostic Studies: Dg Chest 2 View  02/20/2012  *RADIOLOGY REPORT*  Clinical Data: Preoperative evaluation for total knee revision. Asthma and diabetes.  Nonsmoker  CHEST - 2 VIEW  Comparison: 01/13/2012  Findings: Heart and mediastinal contours are within normal limits. The lung fields demonstrate mild prominence of the interstitial markings which are stable and compatible with mild underlying bronchitic change.  No focal infiltrates or signs of congestive failure are identified.  Bony structures appear intact.  IMPRESSION: Stable cardiopulmonary appearance with no new focal or acute abnormality identified.   Original Report Authenticated By: Bertha Stakes, M.D.    Dg Knee Right  Port  02/25/2012  *RADIOLOGY REPORT*  Clinical Data: Postop  for total knee arthroplasty.  PORTABLE RIGHT KNEE - 1-2 VIEW  Comparison: 07/01/2006  Findings: Revision of right knee arthroplasty.  No hardware complication or periprosthetic fracture.  Surgical staples project superficial the patella.  IMPRESSION: Expected appearance after revision total knee arthroplasty.   Original Report Authenticated By: Consuello Bossier, M.D.     Disposition: 01-Home or Self Care      Discharge Orders    Future Orders Please Complete By Expires   Diet - low sodium heart healthy      Call MD / Call 911      Comments:   If you experience chest pain or shortness of breath, CALL 911 and be transported to the hospital emergency room.  If you develope a fever above 101 F, pus (white drainage) or increased drainage or redness at the wound, or calf pain, call your surgeon's office.   Constipation Prevention      Comments:   Drink plenty of fluids.  Prune juice may be helpful.  You may use a stool softener, such as Colace (over the counter) 100 mg twice a day.  Use MiraLax (over the counter) for constipation as needed.   Increase activity slowly as tolerated      Discharge instructions      Comments:   1. Weight bearing as tolerated with walker 2. CPM 6 hour per day increase degrees daily 3. Keep incision dry 4. Use knee immobilizer when ambulating         Signed: Adele Milson SCOTT 03/05/2012, 7:51 AM

## 2012-06-11 ENCOUNTER — Other Ambulatory Visit: Payer: Self-pay | Admitting: Orthopedic Surgery

## 2012-06-12 ENCOUNTER — Other Ambulatory Visit (HOSPITAL_COMMUNITY): Payer: Self-pay | Admitting: Orthopedic Surgery

## 2012-06-12 DIAGNOSIS — R531 Weakness: Secondary | ICD-10-CM

## 2012-06-12 DIAGNOSIS — M79604 Pain in right leg: Secondary | ICD-10-CM

## 2012-06-22 ENCOUNTER — Encounter (HOSPITAL_COMMUNITY)
Admission: RE | Admit: 2012-06-22 | Discharge: 2012-06-22 | Disposition: A | Discharge status: Home / Self Care | Payer: BC Managed Care – PPO | Source: Ambulatory Visit | Attending: Orthopedic Surgery | Admitting: Orthopedic Surgery

## 2012-06-22 ENCOUNTER — Ambulatory Visit (HOSPITAL_COMMUNITY)
Admission: RE | Admit: 2012-06-22 | Discharge: 2012-06-22 | Disposition: A | Discharge status: Home / Self Care | Payer: BC Managed Care – PPO | Source: Ambulatory Visit | Attending: Orthopedic Surgery | Admitting: Orthopedic Surgery

## 2012-06-22 ENCOUNTER — Encounter (HOSPITAL_COMMUNITY): Payer: Self-pay

## 2012-06-22 DIAGNOSIS — M25569 Pain in unspecified knee: Secondary | ICD-10-CM | POA: Insufficient documentation

## 2012-06-22 DIAGNOSIS — S82109A Unspecified fracture of upper end of unspecified tibia, initial encounter for closed fracture: Secondary | ICD-10-CM | POA: Insufficient documentation

## 2012-06-22 DIAGNOSIS — M79604 Pain in right leg: Secondary | ICD-10-CM

## 2012-06-22 DIAGNOSIS — M79609 Pain in unspecified limb: Secondary | ICD-10-CM | POA: Insufficient documentation

## 2012-06-22 DIAGNOSIS — M25469 Effusion, unspecified knee: Secondary | ICD-10-CM | POA: Insufficient documentation

## 2012-06-22 DIAGNOSIS — Z96659 Presence of unspecified artificial knee joint: Secondary | ICD-10-CM | POA: Insufficient documentation

## 2012-06-22 DIAGNOSIS — R531 Weakness: Secondary | ICD-10-CM

## 2012-06-22 DIAGNOSIS — X58XXXA Exposure to other specified factors, initial encounter: Secondary | ICD-10-CM | POA: Insufficient documentation

## 2012-06-22 MED ORDER — TECHNETIUM TC 99M MEDRONATE IV KIT
25.0000 | PACK | Freq: Once | INTRAVENOUS | Status: AC | PRN
  Administered 2012-06-22: 25 via INTRAVENOUS

## 2012-08-27 ENCOUNTER — Other Ambulatory Visit: Payer: Self-pay | Admitting: Orthopedic Surgery

## 2012-08-27 DIAGNOSIS — M25561 Pain in right knee: Secondary | ICD-10-CM

## 2012-08-31 ENCOUNTER — Ambulatory Visit
Admission: RE | Admit: 2012-08-31 | Discharge: 2012-08-31 | Disposition: A | Discharge status: Home / Self Care | Payer: BC Managed Care – PPO | Source: Ambulatory Visit | Attending: Orthopedic Surgery | Admitting: Orthopedic Surgery

## 2012-08-31 DIAGNOSIS — M25561 Pain in right knee: Secondary | ICD-10-CM

## 2012-12-23 ENCOUNTER — Encounter: Payer: Self-pay | Admitting: Gastroenterology

## 2013-03-24 ENCOUNTER — Encounter (HOSPITAL_COMMUNITY): Payer: Self-pay | Admitting: Emergency Medicine

## 2013-03-24 DIAGNOSIS — J45909 Unspecified asthma, uncomplicated: Secondary | ICD-10-CM | POA: Insufficient documentation

## 2013-03-24 DIAGNOSIS — Z79899 Other long term (current) drug therapy: Secondary | ICD-10-CM | POA: Insufficient documentation

## 2013-03-24 DIAGNOSIS — M129 Arthropathy, unspecified: Secondary | ICD-10-CM | POA: Insufficient documentation

## 2013-03-24 DIAGNOSIS — N200 Calculus of kidney: Secondary | ICD-10-CM | POA: Insufficient documentation

## 2013-03-24 DIAGNOSIS — I252 Old myocardial infarction: Secondary | ICD-10-CM | POA: Insufficient documentation

## 2013-03-24 DIAGNOSIS — E119 Type 2 diabetes mellitus without complications: Secondary | ICD-10-CM | POA: Insufficient documentation

## 2013-03-24 LAB — URINALYSIS, ROUTINE W REFLEX MICROSCOPIC
Ketones, ur: 15 mg/dL — AB
Protein, ur: 100 mg/dL — AB
Specific Gravity, Urine: 1.037 — ABNORMAL HIGH (ref 1.005–1.030)
Urobilinogen, UA: 1 mg/dL (ref 0.0–1.0)
pH: 5.5 (ref 5.0–8.0)

## 2013-03-24 LAB — CBC WITH DIFFERENTIAL/PLATELET
Basophils Absolute: 0 10*3/uL (ref 0.0–0.1)
Hemoglobin: 12.9 g/dL (ref 12.0–15.0)
Lymphocytes Relative: 38 % (ref 12–46)
Lymphs Abs: 3.4 10*3/uL (ref 0.7–4.0)
MCH: 30 pg (ref 26.0–34.0)
Neutro Abs: 4.8 10*3/uL (ref 1.7–7.7)
Neutrophils Relative %: 53 % (ref 43–77)
Platelets: 282 10*3/uL (ref 150–400)
RBC: 4.3 MIL/uL (ref 3.87–5.11)
RDW: 13.5 % (ref 11.5–15.5)
WBC: 9 10*3/uL (ref 4.0–10.5)

## 2013-03-24 LAB — COMPREHENSIVE METABOLIC PANEL
ALT: 29 U/L (ref 0–35)
AST: 27 U/L (ref 0–37)
Albumin: 3.9 g/dL (ref 3.5–5.2)
Alkaline Phosphatase: 121 U/L — ABNORMAL HIGH (ref 39–117)
CO2: 27 mEq/L (ref 19–32)
Calcium: 9.6 mg/dL (ref 8.4–10.5)
Chloride: 102 mEq/L (ref 96–112)
GFR calc non Af Amer: >90 mL/min (ref >90)
Potassium: 3.6 mEq/L (ref 3.5–5.1)
Sodium: 140 mEq/L (ref 135–145)
Total Bilirubin: 0.3 mg/dL (ref 0.3–1.2)

## 2013-03-24 LAB — URINE MICROSCOPIC-ADD ON

## 2013-03-24 NOTE — ED Notes (Signed)
The pt has had lt hip and or flank pain with bloody urine since yesterday.  Hx of a kidney stone 2 years ago.

## 2013-03-25 ENCOUNTER — Encounter (HOSPITAL_COMMUNITY): Payer: Self-pay | Admitting: Radiology

## 2013-03-25 ENCOUNTER — Emergency Department (HOSPITAL_COMMUNITY)
Admission: EM | Admit: 2013-03-25 | Discharge: 2013-03-25 | Disposition: A | Discharge status: Home / Self Care | Payer: BC Managed Care – PPO | Attending: Emergency Medicine | Admitting: Emergency Medicine

## 2013-03-25 ENCOUNTER — Emergency Department (HOSPITAL_COMMUNITY): Payer: BC Managed Care – PPO

## 2013-03-25 DIAGNOSIS — R109 Unspecified abdominal pain: Secondary | ICD-10-CM

## 2013-03-25 DIAGNOSIS — N200 Calculus of kidney: Secondary | ICD-10-CM

## 2013-03-25 DIAGNOSIS — R319 Hematuria, unspecified: Secondary | ICD-10-CM

## 2013-03-25 LAB — URINE CULTURE
Colony Count: NO GROWTH
Culture: NO GROWTH

## 2013-03-25 MED ORDER — CIPROFLOXACIN HCL 500 MG PO TABS: 500.0000 mg | ORAL_TABLET | Freq: Two times a day (BID) | ORAL | Status: DC

## 2013-03-25 MED ORDER — OXYCODONE-ACETAMINOPHEN 5-325 MG PO TABS
2.0000 | ORAL_TABLET | Freq: Once | ORAL | Status: AC
  Administered 2013-03-25: 2 via ORAL
  Filled 2013-03-25: qty 2

## 2013-03-25 MED ORDER — CIPROFLOXACIN HCL 500 MG PO TABS
500.0000 mg | ORAL_TABLET | Freq: Two times a day (BID) | ORAL | Status: DC
  Administered 2013-03-25: 500 mg via ORAL
  Filled 2013-03-25: qty 1

## 2013-03-25 MED ORDER — ONDANSETRON 8 MG PO TBDP: 8.0000 mg | ORAL_TABLET | Freq: Three times a day (TID) | ORAL | Status: DC | PRN

## 2013-03-25 MED ORDER — OXYCODONE-ACETAMINOPHEN 5-325 MG PO TABS: 2.0000 | ORAL_TABLET | Freq: Four times a day (QID) | ORAL | Status: DC | PRN

## 2013-03-25 NOTE — ED Provider Notes (Signed)
CSN: [id number]     Arrival date & time 03/24/13  2032 History   First MD Initiated Contact with Patient 03/25/13 0155     Chief Complaint  Patient presents with  . Flank Pain   (Consider location/radiation/quality/duration/timing/severity/associated sxs/prior Treatment) HPI 57 year old female presents to emergency room with complaint of left flank.  Pain since yesterday, associated with bloody urine.  She denies any dysuria.  No fevers no chills.  She has past history of kidney stone.  She also complains of pain in her left SI joint.  She has had this ongoing since having a total knee revision done.  A year ago.  Patient denies any anterior abdominal pain.  She has taken her regular medications without improvement in symptoms.  No prior history of hematuria.  Past Medical History  Diagnosis Date  . Asthma   . Diabetes mellitus   . Complication of anesthesia     difficulty waking up    . Myocardial infarction     2 yrs ago   . Shortness of breath     with exertion   . Kidney stones   . Arthritis    Past Surgical History  Procedure Laterality Date  . Replacement total knee    . Abdominal hysterectomy    . Cholecystectomy    . Hernia repair    . Hiatal hernia repair    . Wrist surgery      left  . Total knee revision  02/25/2012    Procedure: TOTAL KNEE REVISION;  Surgeon: Cammy Copa, MD;  Location: [facility];  Service: Orthopedics;  Laterality: Right;  Revise right total knee arthroplasty   No family history on file. History  Substance Use Topics  . Smoking status: Never Smoker   . Smokeless tobacco: Not on file  . Alcohol Use: No   OB History   Grav Para Term Preterm Abortions TAB SAB Ect Mult Living                 Review of Systems  All other systems reviewed and are negative.   See History of Present Illness; otherwise all other systems are reviewed and negative  Allergies  Review of patient's allergies indicates no known allergies.  Home Medications    Current Outpatient Rx  Name  Route  Sig  Dispense  Refill  . albuterol (PROVENTIL HFA;VENTOLIN HFA) 108 (90 BASE) MCG/ACT inhaler   Inhalation   Inhale 2 puffs into the lungs every 4 (four) hours as needed. For shortness of breath         . ibuprofen (ADVIL,MOTRIN) 200 MG tablet   Oral   Take 200 mg by mouth every 6 (six) hours as needed for pain (pain).         . methocarbamol (ROBAXIN) 500 MG tablet   Oral   Take 1 tablet (500 mg total) by mouth 3 (three) times daily.   30 tablet   0   . EXPIRED: gabapentin (NEURONTIN) 100 MG capsule   Oral   Take 1 capsule (100 mg total) by mouth at bedtime.   30 capsule   0    BP 127/66  Pulse 76  Temp(Src) 98.3 F (36.8 C) (Oral)  Resp 20  SpO2 98% Physical Exam  Nursing note and vitals reviewed. Constitutional: She is oriented to person, place, and time. She appears well-developed and well-nourished. She appears distressed (uncomfortable appearing).  HENT:  Head: Normocephalic and atraumatic.  Nose: Nose normal.  Mouth/Throat: Oropharynx is clear  and moist.  Eyes: Conjunctivae and EOM are normal. Pupils are equal, round, and reactive to light.  Neck: Normal range of motion. Neck supple. No JVD present. No tracheal deviation present. No thyromegaly present.  Cardiovascular: Normal rate, regular rhythm, normal heart sounds and intact distal pulses.  Exam reveals no gallop and no friction rub.   No murmur heard. Pulmonary/Chest: Effort normal and breath sounds normal. No stridor. No respiratory distress. She has no wheezes. She has no rales. She exhibits no tenderness.  Abdominal: Soft. Bowel sounds are normal. She exhibits no distension and no mass. There is no tenderness. There is no rebound and no guarding.  Left flank tenderness, left SI joint tenderness  Musculoskeletal: Normal range of motion. She exhibits no edema and no tenderness.  Lymphadenopathy:    She has no cervical adenopathy.  Neurological: She is alert and  oriented to person, place, and time. She exhibits normal muscle tone. Coordination normal.  Skin: Skin is warm and dry. No rash noted. No erythema. No pallor.  Psychiatric: She has a normal mood and affect. Her behavior is normal. Judgment and thought content normal.    ED Course  Procedures (including critical care time) Labs Review Labs Reviewed  URINALYSIS, ROUTINE W REFLEX MICROSCOPIC - Abnormal; Notable for the following:    Color, Urine RED (*)    APPearance TURBID (*)    Specific Gravity, Urine 1.037 (*)    Hgb urine dipstick LARGE (*)    Bilirubin Urine MODERATE (*)    Ketones, ur 15 (*)    Protein, ur 100 (*)    Nitrite POSITIVE (*)    Leukocytes, UA MODERATE (*)    All other components within normal limits  COMPREHENSIVE METABOLIC PANEL - Abnormal; Notable for the following:    Glucose, Bld 195 (*)    Alkaline Phosphatase 121 (*)    All other components within normal limits  URINE MICROSCOPIC-ADD ON - Abnormal; Notable for the following:    Bacteria, UA FEW (*)    All other components within normal limits  URINE CULTURE  CBC WITH DIFFERENTIAL   Imaging Review Ct Abdomen Pelvis Wo Contrast  03/25/2013   CLINICAL DATA:  Flank pain and hematuria. Evaluate for stone.  EXAM: CT ABDOMEN AND PELVIS WITHOUT CONTRAST  TECHNIQUE: Multidetector CT imaging of the abdomen and pelvis was performed following the standard protocol without intravenous contrast.  COMPARISON:  08/25/2011  FINDINGS: BODY WALL: Unremarkable.  LOWER CHEST: Unremarkable.  ABDOMEN/PELVIS:  Liver: Diffuse steatosis.  Biliary: Cholecystectomy.  Pancreas: Unremarkable.  Spleen: Unremarkable.  Adrenals: Unremarkable.  Kidneys and ureters: 5 mm stone at the right ureteral pelvic junction with surrounding urothelial thickening. No definite right hydronephrosis. There is a small stone in the interpolar right kidney, 3 mm in diameter. There are at least 2 other punctate stones on the right. The burden of stone disease has  increased since 2013. No left-sided stones or hydronephrosis.  Bladder: Unremarkable.  Reproductive: Hysterectomy.  Bowel: No obstruction. Normal appendix. Nissen fundoplication, unchanged in appearance.  Retroperitoneum: No mass or adenopathy.  Peritoneum: No free fluid or gas.  Vascular: No acute abnormality.  OSSEOUS: Nonspecific 0 1 cm area of sclerosis in the right ilium, near the SI joint, is benign and stable from priors.  IMPRESSION: 1. 5 mm stone at the right ureteropelvic junction with neighboring urothelial thickening. 2. Right nephrolithiasis which is progressed since comparison in 2013.   Electronically Signed   By: Tiburcio Pea M.D.   On: 03/25/2013 03:21  EKG Interpretation   None       MDM   1. Hematuria   2. Left flank pain   3. Nephrolithiasis    57 year old female with hematuria.  CT scan shows right-sided nephrolithiasis, but no hydronephrosis.  No symptoms on the left.  2.  Numerous to count red blood cells, also white blood cells.  This may be a hemorrhagic cystitis.  Will treat with antibiotics.  Will have her followup with urology.   Olivia Mackie, MD 03/25/13 [phone]

## 2013-09-04 ENCOUNTER — Encounter (HOSPITAL_COMMUNITY): Payer: BC Managed Care – PPO | Admitting: Anesthesiology

## 2013-09-04 ENCOUNTER — Emergency Department (HOSPITAL_COMMUNITY): Payer: BC Managed Care – PPO

## 2013-09-04 ENCOUNTER — Observation Stay (HOSPITAL_COMMUNITY)
Admission: EM | Admit: 2013-09-04 | Discharge: 2013-09-04 | Disposition: A | Discharge status: Home / Self Care | Payer: BC Managed Care – PPO | Attending: Urology | Admitting: Urology

## 2013-09-04 ENCOUNTER — Encounter (HOSPITAL_COMMUNITY): Payer: Self-pay | Admitting: Emergency Medicine

## 2013-09-04 ENCOUNTER — Encounter (HOSPITAL_COMMUNITY)
Admission: EM | Disposition: A | Discharge status: Home / Self Care | Payer: Self-pay | Source: Home / Self Care | Attending: Emergency Medicine

## 2013-09-04 ENCOUNTER — Observation Stay (HOSPITAL_COMMUNITY): Payer: BC Managed Care – PPO | Admitting: Anesthesiology

## 2013-09-04 DIAGNOSIS — Z9089 Acquired absence of other organs: Secondary | ICD-10-CM | POA: Insufficient documentation

## 2013-09-04 DIAGNOSIS — Z96659 Presence of unspecified artificial knee joint: Secondary | ICD-10-CM | POA: Insufficient documentation

## 2013-09-04 DIAGNOSIS — J45909 Unspecified asthma, uncomplicated: Secondary | ICD-10-CM | POA: Insufficient documentation

## 2013-09-04 DIAGNOSIS — N200 Calculus of kidney: Secondary | ICD-10-CM | POA: Insufficient documentation

## 2013-09-04 DIAGNOSIS — Z90710 Acquired absence of both cervix and uterus: Secondary | ICD-10-CM | POA: Insufficient documentation

## 2013-09-04 DIAGNOSIS — N1330 Unspecified hydronephrosis: Secondary | ICD-10-CM | POA: Insufficient documentation

## 2013-09-04 DIAGNOSIS — D72829 Elevated white blood cell count, unspecified: Secondary | ICD-10-CM | POA: Insufficient documentation

## 2013-09-04 DIAGNOSIS — I252 Old myocardial infarction: Secondary | ICD-10-CM | POA: Insufficient documentation

## 2013-09-04 DIAGNOSIS — Z79899 Other long term (current) drug therapy: Secondary | ICD-10-CM | POA: Insufficient documentation

## 2013-09-04 DIAGNOSIS — N201 Calculus of ureter: Principal | ICD-10-CM | POA: Insufficient documentation

## 2013-09-04 DIAGNOSIS — E119 Type 2 diabetes mellitus without complications: Secondary | ICD-10-CM | POA: Insufficient documentation

## 2013-09-04 DIAGNOSIS — K219 Gastro-esophageal reflux disease without esophagitis: Secondary | ICD-10-CM | POA: Insufficient documentation

## 2013-09-04 HISTORY — PX: CYSTOSCOPY W/ URETERAL STENT PLACEMENT: SHX1429

## 2013-09-04 LAB — URINE MICROSCOPIC-ADD ON

## 2013-09-04 LAB — CBC WITH DIFFERENTIAL/PLATELET
BASOS PCT: 0 % (ref 0–1)
Basophils Absolute: 0 10*3/uL (ref 0.0–0.1)
Eosinophils Absolute: 0 10*3/uL (ref 0.0–0.7)
Eosinophils Relative: 0 % (ref 0–5)
HEMATOCRIT: 42.4 % (ref 36.0–46.0)
HEMOGLOBIN: 14.1 g/dL (ref 12.0–15.0)
LYMPHS ABS: 1.7 10*3/uL (ref 0.7–4.0)
Lymphocytes Relative: 14 % (ref 12–46)
MCH: 30.4 pg (ref 26.0–34.0)
MCHC: 33.3 g/dL (ref 30.0–36.0)
MCV: 91.4 fL (ref 78.0–100.0)
MONO ABS: 0.3 10*3/uL (ref 0.1–1.0)
Monocytes Relative: 3 % (ref 3–12)
NEUTROS ABS: 10.2 10*3/uL — AB (ref 1.7–7.7)
NEUTROS PCT: 83 % — AB (ref 43–77)
Platelets: 250 10*3/uL (ref 150–400)
RBC: 4.64 MIL/uL (ref 3.87–5.11)
RDW: 13.7 % (ref 11.5–15.5)
WBC: 12.3 10*3/uL — ABNORMAL HIGH (ref 4.0–10.5)

## 2013-09-04 LAB — URINALYSIS, ROUTINE W REFLEX MICROSCOPIC
Bilirubin Urine: NEGATIVE
Glucose, UA: NEGATIVE mg/dL
Ketones, ur: 15 mg/dL — AB
Leukocytes, UA: NEGATIVE
Nitrite: NEGATIVE
Protein, ur: 30 mg/dL — AB
SPECIFIC GRAVITY, URINE: 1.025 (ref 1.005–1.030)
Urobilinogen, UA: 0.2 mg/dL (ref 0.0–1.0)
pH: 6 (ref 5.0–8.0)

## 2013-09-04 LAB — BASIC METABOLIC PANEL
BUN: 22 mg/dL (ref 6–23)
CHLORIDE: 99 meq/L (ref 96–112)
CO2: 22 mEq/L (ref 19–32)
Calcium: 10 mg/dL (ref 8.4–10.5)
Creatinine, Ser: 0.89 mg/dL (ref 0.50–1.10)
GFR, EST AFRICAN AMERICAN: 83 mL/min — AB (ref >90)
GFR, EST NON AFRICAN AMERICAN: 72 mL/min — AB (ref >90)
Glucose, Bld: 195 mg/dL — ABNORMAL HIGH (ref 70–99)
POTASSIUM: 4.5 meq/L (ref 3.7–5.3)
SODIUM: 141 meq/L (ref 137–147)

## 2013-09-04 LAB — GLUCOSE, CAPILLARY: Glucose-Capillary: 111 mg/dL — ABNORMAL HIGH (ref 70–99)

## 2013-09-04 SURGERY — CYSTOSCOPY, WITH RETROGRADE PYELOGRAM AND URETERAL STENT INSERTION
Anesthesia: General | Laterality: Right

## 2013-09-04 MED ORDER — PROPOFOL 10 MG/ML IV BOLUS
INTRAVENOUS | Status: DC | PRN
  Administered 2013-09-04: 100 mg via INTRAVENOUS

## 2013-09-04 MED ORDER — FENTANYL CITRATE 0.05 MG/ML IJ SOLN
INTRAMUSCULAR | Status: DC | PRN
  Administered 2013-09-04: 25 ug via INTRAVENOUS

## 2013-09-04 MED ORDER — PROPOFOL 10 MG/ML IV BOLUS
INTRAVENOUS | Status: AC
Start: 2013-09-04 — End: 2013-09-04
  Filled 2013-09-04: qty 20

## 2013-09-04 MED ORDER — MIDAZOLAM HCL 2 MG/2ML IJ SOLN
INTRAMUSCULAR | Status: AC
  Filled 2013-09-04: qty 2

## 2013-09-04 MED ORDER — SODIUM CHLORIDE 0.9 % IR SOLN
Status: DC | PRN
  Administered 2013-09-04: 3000 mL

## 2013-09-04 MED ORDER — OXYBUTYNIN CHLORIDE 5 MG PO TABS: 5.0000 mg | ORAL_TABLET | Freq: Four times a day (QID) | ORAL | Status: DC | PRN

## 2013-09-04 MED ORDER — ONDANSETRON HCL 4 MG/2ML IJ SOLN
INTRAMUSCULAR | Status: DC | PRN
  Administered 2013-09-04: 4 mg via INTRAVENOUS

## 2013-09-04 MED ORDER — HYDROMORPHONE HCL PF 1 MG/ML IJ SOLN
1.0000 mg | Freq: Once | INTRAMUSCULAR | Status: AC
  Administered 2013-09-04: 1 mg via INTRAVENOUS
  Filled 2013-09-04: qty 1

## 2013-09-04 MED ORDER — LACTATED RINGERS IV SOLN: INTRAVENOUS | Status: DC

## 2013-09-04 MED ORDER — PHENAZOPYRIDINE HCL 200 MG PO TABS: 200.0000 mg | ORAL_TABLET | Freq: Three times a day (TID) | ORAL | Status: DC | PRN

## 2013-09-04 MED ORDER — ONDANSETRON HCL 4 MG/2ML IJ SOLN
4.0000 mg | Freq: Once | INTRAMUSCULAR | Status: AC
  Administered 2013-09-04: 4 mg via INTRAVENOUS
  Filled 2013-09-04: qty 2

## 2013-09-04 MED ORDER — SENNOSIDES-DOCUSATE SODIUM 8.6-50 MG PO TABS: 1.0000 | ORAL_TABLET | Freq: Two times a day (BID) | ORAL | Status: DC

## 2013-09-04 MED ORDER — FENTANYL CITRATE 0.05 MG/ML IJ SOLN
INTRAMUSCULAR | Status: AC
  Filled 2013-09-04: qty 2

## 2013-09-04 MED ORDER — ONDANSETRON HCL 4 MG/2ML IJ SOLN: 4.0000 mg | Freq: Once | INTRAMUSCULAR | Status: DC

## 2013-09-04 MED ORDER — LIDOCAINE HCL 2 % EX GEL
CUTANEOUS | Status: AC
  Filled 2013-09-04: qty 10

## 2013-09-04 MED ORDER — HYDROMORPHONE HCL PF 1 MG/ML IJ SOLN: 0.2500 mg | INTRAMUSCULAR | Status: DC | PRN

## 2013-09-04 MED ORDER — ONDANSETRON HCL 4 MG/2ML IJ SOLN
INTRAMUSCULAR | Status: AC
  Filled 2013-09-04: qty 2

## 2013-09-04 MED ORDER — MIDAZOLAM HCL 5 MG/5ML IJ SOLN
INTRAMUSCULAR | Status: DC | PRN
  Administered 2013-09-04: 1 mg via INTRAVENOUS

## 2013-09-04 MED ORDER — CEFAZOLIN SODIUM-DEXTROSE 2-3 GM-% IV SOLR
2.0000 g | Freq: Three times a day (TID) | INTRAVENOUS | Status: DC
  Administered 2013-09-04: 2 g via INTRAVENOUS
  Filled 2013-09-04 (×2): qty 50

## 2013-09-04 MED ORDER — LACTATED RINGERS IV SOLN
INTRAVENOUS | Status: DC | PRN
  Administered 2013-09-04: 17:00:00 via INTRAVENOUS

## 2013-09-04 MED ORDER — CEPHALEXIN 500 MG PO CAPS: 500.0000 mg | ORAL_CAPSULE | Freq: Three times a day (TID) | ORAL | Status: DC

## 2013-09-04 MED ORDER — BELLADONNA ALKALOIDS-OPIUM 16.2-60 MG RE SUPP
RECTAL | Status: DC | PRN
  Administered 2013-09-04: 1 via RECTAL

## 2013-09-04 MED ORDER — OXYCODONE-ACETAMINOPHEN 5-325 MG PO TABS: 1.0000 | ORAL_TABLET | ORAL | Status: DC | PRN

## 2013-09-04 MED ORDER — IOHEXOL 300 MG/ML  SOLN
INTRAMUSCULAR | Status: DC | PRN
  Administered 2013-09-04: 10 mL

## 2013-09-04 MED ORDER — SODIUM CHLORIDE 0.9 % IV SOLN
INTRAVENOUS | Status: DC
  Administered 2013-09-04 (×2): via INTRAVENOUS

## 2013-09-04 MED ORDER — LIDOCAINE HCL 2 % EX GEL
CUTANEOUS | Status: DC | PRN
  Administered 2013-09-04: 1

## 2013-09-04 MED ORDER — PROMETHAZINE HCL 25 MG/ML IJ SOLN: 6.2500 mg | INTRAMUSCULAR | Status: DC | PRN

## 2013-09-04 MED ORDER — BELLADONNA ALKALOIDS-OPIUM 16.2-60 MG RE SUPP
RECTAL | Status: AC
  Filled 2013-09-04: qty 1

## 2013-09-04 SURGICAL SUPPLY — 15 items
BAG URO CATCHER STRL LF (DRAPE) ×2 IMPLANT
BASKET ZERO TIP NITINOL 2.4FR (BASKET) IMPLANT
BSKT STON RTRVL ZERO TP 2.4FR (BASKET)
CATH INTERMIT  6FR 70CM (CATHETERS) IMPLANT
CATH URET 5FR 28IN OPEN ENDED (CATHETERS) IMPLANT
DRAPE CAMERA CLOSED 9X96 (DRAPES) ×2 IMPLANT
GLOVE BIOGEL M 7.0 STRL (GLOVE) ×2 IMPLANT
GOWN STRL REUS W/TWL LRG LVL3 (GOWN DISPOSABLE) ×2 IMPLANT
GUIDEWIRE ANG ZIPWIRE 038X150 (WIRE) IMPLANT
GUIDEWIRE STR DUAL SENSOR (WIRE) ×2 IMPLANT
MANIFOLD NEPTUNE II (INSTRUMENTS) ×2 IMPLANT
PACK CYSTO (CUSTOM PROCEDURE TRAY) ×2 IMPLANT
SCRUB PCMX 4 OZ (MISCELLANEOUS) ×2 IMPLANT
STENT CONTOUR 6FRX24X.038 (STENTS) ×1 IMPLANT
TUBING CONNECTING 10 (TUBING) ×2 IMPLANT

## 2013-09-04 NOTE — ED Notes (Signed)
Consent form sent with [facility].

## 2013-09-04 NOTE — ED Notes (Signed)
Pt brought in from [facility] via [facility]. Pt here awaiting surgery for 7 mm kidney stone. Report received from [facility]. Consent signed per [facility].

## 2013-09-04 NOTE — H&P (Signed)
Urology History and Physical Exam  CC: Right ureter stones.  HPI: 58 year old female presents today with right ureter stones. There were 2 stones in her right ureter. This was discovered on CT scan today. One is in the proximal ureter and measures approximately 0.7 cm in size. Another is at the pelvic brim and measures approximately 0.4 [place] sites. They are causing right-sided hydronephrosis. The patient has leukocytosis but her urine does not look infected. She has nausea and pain. She is having a difficult time controlling her pain with pain medication. She has a history of kidney stones. She states she's never required surgery for kidney stones. She has a personal history of a mild heart attack in approximate 2012. She states she does not follow with cardiologist for this.  Today we discussed the management of obstructing urinary stones. These options include observation with pain control, percutaneous nephrostomy tube, or cystoscopy with retrograde ureteral stent placement with possible ureteroscopy and laser lithotripsy.  We discussed the risks, benefits, alternatives and likelihood of achieving the patient's goals.  We discussed which options are relevant to this particular situation. We discussed the natural history of stones and the as well as the complications of untreated stones and the impact on quality of life without treatment as well as with each of the above listed treatments. We also discussed the efficacy of each treatment. With any of these management options I discussed the signs and symptoms of infection and the need for emergent treatment should these be experienced.   For observation I described the risks which include, but are not limited to, silent renal damage, life-threatening infection, need for emergent surgery, failure to pass stone, and pain.  For stent placement with or without ureteroscopy and lithotripsy I described the risks which include, but are not limited to,  heart attack, stroke, pulmonary embolus, death, bleeding, infection, damage to contiguous structures, positioning injury, ureteral stricture, ureteral avulsion, ureteral injury, need for ureteral stent, inability to perform ureteroscopy, need for an interval procedure, inability to clear stone burden, stent discomfort and pain.  For percutaneous nephrostomy tube I described the risks which including, but are not limited to, heart attack, stroke, pulmonary embolus, death, arterial venous fistula or malformation, need for embolization of kidney, loss of kidney or renal function.  The patient had the opportunity to ask questions to their stated satisfaction and voiced understanding.  I explained that treatment for her obstruction would not result in passage of the stone. Therefore, today we discussed the management of urinary stones. These options include observation, ureteroscopy, shockwave lithotripsy, and PCNL. We discussed which options are relevant to these particular stones. We discussed the natural history of stones as well as the complications of untreated stones and the impact on quality of life without treatment as well as with each of the above listed treatments. We also discussed the efficacy of each treatment in its ability to clear the stone burden. With any of these management options I discussed the signs and symptoms of infection and the need for emergent treatment should these be experienced. For each option we discussed the ability of each procedure to clear the patient of their stone burden.  For observation I described the risks which include but are not limited to silent renal damage, life-threatening infection, need for emergent surgery, failure to pass stone, and pain.  For ureteroscopy I described the risks which include heart attack, stroke, pulmonary embolus, death, bleeding, infection, damage to contiguous structures, positioning injury, ureteral stricture, ureteral avulsion,  ureteral injury, need for ureteral stent, inability to perform ureteroscopy, need for an interval procedure, inability to clear stone burden, stent discomfort and pain.  For shockwave lithotripsy I described the risks which include arrhythmia, kidney contusion, kidney hemorrhage, need for transfusion, long-term risk of diabetes or hypertension, back discomfort, flank ecchymosis, flank abrasion, inability to break up stone, inability to pass stone fragments, Steinstrasse, infection associated with obstructing stones, need for different surgical procedure, need for repeat shockwave lithotripsy, and death.   PMH: Past Medical History  Diagnosis Date  . Asthma   . Diabetes mellitus   . Complication of anesthesia     difficulty waking up    . Myocardial infarction     2 yrs ago   . Shortness of breath     with exertion   . Kidney stones   . Arthritis     PSH: Past Surgical History  Procedure Laterality Date  . Replacement total knee    . Abdominal hysterectomy    . Cholecystectomy    . Hernia repair    . Hiatal hernia repair    . Wrist surgery      left  . Total knee revision  02/25/2012    Procedure: TOTAL KNEE REVISION;  Surgeon: Cammy Copa, MD;  Location: [facility] OR;  Service: Orthopedics;  Laterality: Right;  Revise right total knee arthroplasty    Allergies: No Known Allergies  Medications:  (Not in a hospital admission)   Social History: History   Social History  . Marital Status: Widowed    Spouse Name: N/A    Number of Children: N/A  . Years of Education: N/A   Occupational History  . Not on file.   Social History Main Topics  . Smoking status: Never Smoker   . Smokeless tobacco: Not on file  . Alcohol Use: No  . Drug Use: No  . Sexual Activity:    Other Topics Concern  . Not on file   Social History Narrative  . No narrative on file    Family History: History reviewed. No pertinent family history.  Review of Systems: Positive: Nausea,  chills, right flank pain. Negative: Fever, SOB, or chest pain.  A further 10 point review of systems was negative except what is listed in the HPI.  Physical Exam: Filed Vitals:   09/04/13 1607  BP: 127/62  Pulse: 73  Temp: 98.1 F (36.7 C)  Resp: 16    General: No acute distress.  Awake. Head:  Normocephalic.  Atraumatic. ENT:  EOMI.  Mucous membranes moist Neck:  Supple.  No lymphadenopathy. CV:  S1 present. S2 present. Regular rate. Pulmonary: Equal effort bilaterally.  Clear to auscultation bilaterally. Abdomen: Soft.  Non- tender to palpation. Skin:  Normal turgor.  No visible rash. Extremity: No gross deformity of bilateral upper extremities.  No gross deformity of    bilateral lower extremities. Neurologic: Alert. Appropriate mood.    Studies:  Recent Labs     09/04/13  0921  HGB  14.1  WBC  12.3*  PLT  250    Recent Labs     09/04/13  0921  NA  141  K  4.5  CL  99  CO2  22  BUN  22  CREATININE  0.89  CALCIUM  10.0  GFRNONAA  72*  GFRAA  83*     No results found for this basename: PT, INR, APTT,  in the last 72 hours   No components found with this basename:  ABG,     Assessment:  Right ureter stones.  Plan: She wishes to proceed to the operating room for cystoscopy, right retrograde pyelogram, and right ureter stent placement.  Ancef IV for surgical prophylaxis.  Informed consent obtained. Correct surgical site marked.  She wishes to be scheduled in an interval fashion for cystoscopy, right ureteroscopy, laser lithotripsy, right retrograde pyelogram, and right ureter stent exchange. She will have to be referred to a cardiologist for surgical clearance before we can schedule the surgery. I do not feel that we need cardiac arrest today if this is more urgent in nature.

## 2013-09-04 NOTE — ED Notes (Signed)
Dr.Knapp at bedside  

## 2013-09-04 NOTE — ED Notes (Signed)
Ancef not hung due to medication not available in the ED Pyxis.

## 2013-09-04 NOTE — ED Notes (Signed)
This RN made Dr. Lynelle DoctorKnapp aware of pt pulse range from 48 - 67.  MD ok with administering the Dilaudid.

## 2013-09-04 NOTE — Op Note (Signed)
Urology Operative Report  Date of Procedure: 09/04/13  Surgeon: Natalia Leatherwoodaniel Jemia Fata, MD Assistant:  None  Preoperative Diagnosis: Right ureter stones. Postoperative Diagnosis:  Same  Procedure(s): Cystoscopy. Right ureter stent placement (6 x 24 JJ, no tether). Right retrograde pyelogram with interpretation.  Estimated blood loss: None  Specimen: None  Drains: None  Complications: None  Findings: Right hydronephrosis. Right proximal and distal ureter stones.  History of present illness: 58 year old female presented to the ER with right flank pain. She was found to have 2 right-sided ureter stones; one in the right distal ureter and 1 in the right proximal ureter. She was not able to control her pain and she elected to have a right ureter stent placed.   Procedure in detail: After informed consent was obtained, the patient was taken to the operating room. They were placed in the supine position. SCDs were turned on and in place. IV antibiotics were infused, and general anesthesia was induced. A timeout was performed in which the correct patient, surgical site, and procedure were identified and agreed upon by the team.  The patient was placed in a dorsolithotomy position, making sure to pad all pertinent neurovascular pressure points. The genitals were prepped and draped in the usual sterile fashion.  A rigid cystoscope was advanced through the urethra and into the bladder. The bladder was drained and then fully distended and evaluated in a systematic fashion to visualize the entire surface of the bladder. This was negative for bladder tumors.  Attention was turned to right ureter orifice which was noted to be located very laterally on her right bladder wall.   I obtained a right retrograde pyelogram by cannulating the right ureter orifice with a 5 French ureter catheter and injecting 10 cc of Omnipaque. There was noted to be a filling defect in the right distal ureter which was small  consistent with the smaller distal stone seen on CT scan, and a larger filling defect in the proximal ureter consistent with the larger stone seen on CT scan. There is also hydronephrosis. This side did not drain well.  I placed a sensor wire up the right ureter orifice the on the 2 stones in the right renal pelvis. I then placed a 6 x 24 double-J stent over the wire under fluoroscopy through the cystoscope with ease. This was deployed with a good curl in the right renal pelvis and in the bladder. There was return of amber colored urine.  The bladder was drained and the cystoscope was removed. I placed 10 cc of lidocaine jelly into the urethra and a belladonna and opium suppository into the rectum.  Anesthesia was reversed, she was placed back in a supine position, and she was taken to the PACU in a stable condition.  All counts were correct at the end of the case.  She will be given Keflex for an additional 3 days. She'll be scheduled for interval right-sided ureteroscopy after she has had outpatient cardiac clearance.

## 2013-09-04 NOTE — Anesthesia Postprocedure Evaluation (Signed)
Anesthesia Post Note  Patient: [name]  Procedure(s) Performed: Procedure(s) (LRB): CYSTOSCOPY WITH RETROGRADE PYELOGRAM/URETERAL STENT PLACEMENT (Right)  Anesthesia type: General  Patient location: PACU  Post pain: Pain level controlled  Post assessment: Post-op Vital signs reviewed  Last Vitals:  Filed Vitals:   09/04/13 1810  BP:   Pulse: 75  Temp:   Resp: 10    Post vital signs: Reviewed  Level of consciousness: sedated  Complications: No apparent anesthesia complications

## 2013-09-04 NOTE — ED Notes (Signed)
Pt presents from home with c/o of lower right abdominal pain and lower right back pain that started this am around 6:15am.  Pt states the pain is 9/10.  Vomiting x 4 once pain started this am.

## 2013-09-04 NOTE — ED Notes (Signed)
Bed: [id number] Expected date:  Expected time:  Means of arrival:  Comments: tx from [facility]

## 2013-09-04 NOTE — Discharge Instructions (Signed)
DISCHARGE INSTRUCTIONS FOR KIDNEY STONES OR URETERAL STENT ° °MEDICATIONS:  ° °1.  Resume all your other meds from home. ° °ACTIVITY °1. No strenuous activity x 1week °2. No driving while on narcotic pain medications °3. Drink plenty of water °4. Continue to walk at home - you can still get blood clots when you are at home, so keep active, but don't over do it. °5. May return to work in 3 days. ° °BATHING °1. You can shower or take a bath. ° ° ° °SIGNS/SYMPTOMS TO CALL: °1. Please call us if you have a fever greater than 101.5, uncontrolled  °nausea/vomiting, uncontrolled pain, dizziness, unable to urinate, chest pain, shortness of breath, leg swelling, leg pain, redness around wound, drainage from wound, or any other concerns or questions. ° °You can reach us at [phone]. ° ° °Post Anesthesia Home Care Instructions ° °Activity: °Get plenty of rest for the remainder of the day. A responsible adult should stay with you for 24 hours following the procedure.  °For the next 24 hours, DO NOT: °-Drive a car °-Operate machinery °-Drink alcoholic beverages °-Take any medication unless instructed by your physician °-Make any legal decisions or sign important papers. ° °Meals: °Start with liquid foods such as gelatin or soup. Progress to regular foods as tolerated. Avoid greasy, spicy, heavy foods. If nausea and/or vomiting occur, drink only clear liquids until the nausea and/or vomiting subsides. Call your physician if vomiting continues. ° °Special Instructions/Symptoms: °Your throat may feel dry or sore from the anesthesia or the breathing tube placed in your throat during surgery. If this causes discomfort, gargle with warm salt water. The discomfort should disappear within 24 hours. ° ° °

## 2013-09-04 NOTE — Anesthesia Preprocedure Evaluation (Signed)
Anesthesia Evaluation  Patient identified by MRN, date of birth, ID band Patient awake    Reviewed: Allergy & Precautions, H&P , NPO status , Patient's Chart, lab work & pertinent test results  History of Anesthesia Complications (+) history of anesthetic complications  Airway Mallampati: II TM Distance: >3 FB Neck ROM: Full    Dental  (+) Teeth Intact, Dental Advisory Given, Poor Dentition, Missing, Partial Upper, Partial Lower   Pulmonary shortness of breath, asthma ,  breath sounds clear to auscultation  Pulmonary exam normal       Cardiovascular + Past MI (Patient denies cardiac history) negative cardio ROS  Rhythm:Regular Rate:Normal     Neuro/Psych negative neurological ROS  negative psych ROS   GI/Hepatic Neg liver ROS, PUD, GERD-  ,  Endo/Other  diabetes (diet control)  Renal/GU Renal disease  negative genitourinary   Musculoskeletal negative musculoskeletal ROS (+)   Abdominal   Peds negative pediatric ROS (+)  Hematology negative hematology ROS (+)   Anesthesia Other Findings   Reproductive/Obstetrics                           Anesthesia Physical Anesthesia Plan  ASA: II and emergent  Anesthesia Plan: General   Post-op Pain Management:    Induction: Intravenous  Airway Management Planned: LMA  Additional Equipment:   Intra-op Plan:   Post-operative Plan: Extubation in OR  Informed Consent: I have reviewed the patients History and Physical, chart, labs and discussed the procedure including the risks, benefits and alternatives for the proposed anesthesia with the patient or authorized representative who has indicated his/her understanding and acceptance.   Dental advisory given  Plan Discussed with: CRNA  Anesthesia Plan Comments:         Anesthesia Quick Evaluation

## 2013-09-04 NOTE — ED Provider Notes (Signed)
CSN: [id number]     Arrival date & time 09/04/13  [id number] History   First MD Initiated Contact with Patient 09/04/13 [id number]     Chief Complaint  Patient presents with  . Abdominal Pain    right   . Back Pain    lower right     (Consider location/radiation/quality/duration/timing/severity/associated sxs/prior Treatment) HPI Patient reports this morning while she was in the shower getting ready to go to work about 5:30 in the morning she had acute onset of right flank pain that radiates into her right groin. The pain has been constant and she describes it as sharp. She states movement and deep breathing makes the pain worse. Nothing makes it feel better. She has had nausea and vomiting. She denies any hematuria, fever, or chills. She states she feels this feels different from her prior kidney stones. Patient was seen here in October 2014 at that time she had a right UPJ stone. She was referred to [facility] and given a strainer and was told she should pass the stone. She states as far she knows she never passed the stone.   PCP Dr Maggie Font Sanders  Past Medical History  Diagnosis Date  . Asthma   . Diabetes mellitus   . Complication of anesthesia     difficulty waking up    . Myocardial infarction     2 yrs ago   . Shortness of breath     with exertion   . Kidney stones   . Arthritis    Past Surgical History  Procedure Laterality Date  . Replacement total knee    . Abdominal hysterectomy    . Cholecystectomy    . Hernia repair    . Hiatal hernia repair    . Wrist surgery      left  . Total knee revision  02/25/2012    Procedure: TOTAL KNEE REVISION;  Surgeon: Cammy CopaGregory Scott Dean, MD;  [facility];  Service: Orthopedics;  Laterality: Right;  Revise right total knee arthroplasty   History reviewed. No pertinent family history. History  Substance Use Topics  . Smoking status: Never Smoker   . Smokeless tobacco: Not on file  . Alcohol Use: No   Employed at [employer]  OB  History   Grav Para Term Preterm Abortions TAB SAB Ect Mult Living                 Review of Systems  All other systems reviewed and are negative.     Allergies  Review of patient's allergies indicates no known allergies.  Home Medications   Current Outpatient Rx  Name  Route  Sig  Dispense  Refill  . albuterol (PROVENTIL HFA;VENTOLIN HFA) 108 (90 BASE) MCG/ACT inhaler   Inhalation   Inhale 2 puffs into the lungs every 4 (four) hours as needed. For shortness of breath         . EXPIRED: gabapentin (NEURONTIN) 100 MG capsule   Oral   Take 1 capsule (100 mg total) by mouth at bedtime.   30 capsule   0   . ibuprofen (ADVIL,MOTRIN) 200 MG tablet   Oral   Take 200 mg by mouth every 6 (six) hours as needed for pain (pain).         . methocarbamol (ROBAXIN) 500 MG tablet   Oral   Take 1 tablet (500 mg total) by mouth 3 (three) times daily.   30 tablet   0   . ondansetron (ZOFRAN ODT) 8  MG disintegrating tablet   Oral   Take 1 tablet (8 mg total) by mouth every 8 (eight) hours as needed for nausea.   20 tablet   0   . oxyCODONE-acetaminophen (PERCOCET/ROXICET) 5-325 MG per tablet   Oral   Take 2 tablets by mouth every 6 (six) hours as needed for pain.   30 tablet   0    BP 156/80  Pulse 65  Temp(Src) 97.3 F (36.3 C) (Oral)  Resp 22  Ht 5\' 3"  (1.6 m)  Wt 180 lb (81.647 kg)  BMI 31.89 kg/m2  SpO2 98%  Vital signs normal except hypertension  Physical Exam  Nursing note and vitals reviewed. Constitutional: She is oriented to person, place, and time. She appears well-developed and well-nourished.  Non-toxic appearance. She does not appear ill. No distress.  HENT:  Head: Normocephalic and atraumatic.  Right Ear: External ear normal.  Left Ear: External ear normal.  Nose: Nose normal. No mucosal edema or rhinorrhea.  Mouth/Throat: Oropharynx is clear and moist and mucous membranes are normal. No dental abscesses or uvula swelling.  Eyes: Conjunctivae and  EOM are normal. Pupils are equal, round, and reactive to light.  Neck: Normal range of motion and full passive range of motion without pain. Neck supple.  Cardiovascular: Normal rate, regular rhythm and normal heart sounds.  Exam reveals no gallop and no friction rub.   No murmur heard. Pulmonary/Chest: Effort normal and breath sounds normal. No respiratory distress. She has no wheezes. She has no rhonchi. She has no rales. She exhibits no tenderness and no crepitus.  Abdominal: Soft. Normal appearance and bowel sounds are normal. She exhibits no distension. There is tenderness. There is no rebound and no guarding.    Mild tenderness in the right abdomen extending to the right lower quadrant  Genitourinary:  Some Rt CVAT  Musculoskeletal: Normal range of motion. She exhibits no edema and no tenderness.       Back:  Moves all extremities well.   Neurological: She is alert and oriented to person, place, and time. She has normal strength. No cranial nerve deficit.  Skin: Skin is warm, dry and intact. No rash noted. No erythema. No pallor.  Psychiatric: She has a normal mood and affect. Her speech is normal and behavior is normal. Her mood appears not anxious.    ED Course  Procedures (including critical care time)  Medications  0.9 %  sodium chloride infusion ( Intravenous New Bag/Given 09/04/13 1225)  HYDROmorphone (DILAUDID) injection 1 mg (not administered)  ceFAZolin (ANCEF) IVPB 2 g/50 mL premix (not administered)  ondansetron (ZOFRAN) injection 4 mg (4 mg Intravenous Given 09/04/13 0925)  HYDROmorphone (DILAUDID) injection 1 mg (1 mg Intravenous Given 09/04/13 0934)  HYDROmorphone (DILAUDID) injection 1 mg (1 mg Intravenous Given 09/04/13 1116)  HYDROmorphone (DILAUDID) injection 1 mg (1 mg Intravenous Given 09/04/13 1403)   Patient rechecked at 11 AM she is still having pain. She states it was mildly better briefly but is now back. Patient given results of her CT scan. Her laboratory  results are still pending. We discussed her stone is very large and stuck proximally. She also has a smaller stone which by itself with be passable in time. We discussed treating her pain and hopefully she would be improved to go home, however due to the size of the stone we also discussed she might need some type of intervention.  Recheck at 12:40 PM patient is still having pain. Her pain has been difficult  to control. We discussed possibility of getting a stent placed to help control her pain until she can have definitive procedure such as lithotripsy done. Patient is agreeable.  14:19 Dr Berneice Heinrich returned call, states he is not on call, gave me phone number to reach Dr Margarita Grizzle  14:23 Dr Margarita Grizzle, accepts patient in transfer to [facility] and he will see there and take to the OR for stenting. Keep NPO (pt has not eaten yet today).   15:15 Dr Elesa Massed, [place] will let evening ED physicians know patient is coming and Dr Margarita Grizzle needs to be called when she arrives.    Study Result    CLINICAL DATA: Flank pain and hematuria. Evaluate for stone.  EXAM:  CT ABDOMEN AND PELVIS WITHOUT CONTRAST  TECHNIQUE:  Multidetector CT imaging of the abdomen and pelvis was performed  following the standard protocol without intravenous contrast.  COMPARISON: 08/25/2011  IMPRESSION:  1. 5 mm stone at the right ureteropelvic junction with neighboring  urothelial thickening.  2. Right nephrolithiasis which is progressed since comparison in  2013.  Electronically Signed  By: Tiburcio Pea M.D.  On: 03/25/2013 03:21      Labs Review Results for orders placed during the hospital encounter of 09/04/13  BASIC METABOLIC PANEL      Result Value Ref Range   Sodium 141  137 - 147 mEq/L   Potassium 4.5  3.7 - 5.3 mEq/L   Chloride 99  96 - 112 mEq/L   CO2 22  19 - 32 mEq/L   Glucose, Bld 195 (*) 70 - 99 mg/dL   BUN 22  6 - 23 mg/dL   Creatinine, Ser 0.96  0.50 - 1.10 mg/dL   Calcium 04.5  8.4 - 40.9 mg/dL   GFR calc  non Af Amer 72 (*) >90 mL/min   GFR calc Af Amer 83 (*) >90 mL/min  CBC WITH DIFFERENTIAL      Result Value Ref Range   WBC 12.3 (*) 4.0 - 10.5 K/uL   RBC 4.64  3.87 - 5.11 MIL/uL   Hemoglobin 14.1  12.0 - 15.0 g/dL   HCT 81.1  91.4 - 78.2 %   MCV 91.4  78.0 - 100.0 fL   MCH 30.4  26.0 - 34.0 pg   MCHC 33.3  30.0 - 36.0 g/dL   RDW 95.6  21.3 - 08.6 %   Platelets 250  150 - 400 K/uL   Neutrophils Relative % 83 (*) 43 - 77 %   Neutro Abs 10.2 (*) 1.7 - 7.7 K/uL   Lymphocytes Relative 14  12 - 46 %   Lymphs Abs 1.7  0.7 - 4.0 K/uL   Monocytes Relative 3  3 - 12 %   Monocytes Absolute 0.3  0.1 - 1.0 K/uL   Eosinophils Relative 0  0 - 5 %   Eosinophils Absolute 0.0  0.0 - 0.7 K/uL   Basophils Relative 0  0 - 1 %   Basophils Absolute 0.0  0.0 - 0.1 K/uL  URINALYSIS, ROUTINE W REFLEX MICROSCOPIC      Result Value Ref Range   Color, Urine YELLOW  YELLOW   APPearance CLOUDY (*) CLEAR   Specific Gravity, Urine 1.025  1.005 - 1.030   pH 6.0  5.0 - 8.0   Glucose, UA NEGATIVE  NEGATIVE mg/dL   Hgb urine dipstick LARGE (*) NEGATIVE   Bilirubin Urine NEGATIVE  NEGATIVE   Ketones, ur 15 (*) NEGATIVE mg/dL   Protein, ur 30 (*) NEGATIVE mg/dL  Urobilinogen, UA 0.2  0.0 - 1.0 mg/dL   Nitrite NEGATIVE  NEGATIVE   Leukocytes, UA NEGATIVE  NEGATIVE  URINE MICROSCOPIC-ADD ON      Result Value Ref Range   Squamous Epithelial / LPF RARE  RARE   RBC / HPF 21-50  <3 RBC/hpf   Laboratory interpretation all normal except hematuria, leukocytosis, hyperglycemia   Imaging Review Ct Abdomen Pelvis Wo Contrast  09/04/2013   CLINICAL DATA:  Right flank pain.  EXAM: CT ABDOMEN AND PELVIS WITHOUT CONTRAST  TECHNIQUE: Multidetector CT imaging of the abdomen and pelvis was performed following the standard protocol without intravenous contrast.  COMPARISON:  03/25/2013  FINDINGS: Patient earlier stone lies proximal right ureter. There is a 4 mm stone in the mid to distal right ureter at the level of the  pelvic brim. No other right ureteral stones. Stones cause moderate right hydroureteronephrosis. There are 2 nonobstructing stones within the right kidney. There is a small lower pole nonobstructing stone on the left. No left hydronephrosis. Normal left ureter no renal mass. Bladder is unremarkable.  Under anterior lung base subsegmental atelectasis. Lung bases are otherwise clear. Heart is normal in size.  Fatty infiltration of the liver. Liver otherwise unremarkable. Gallbladder surgically absent. No bile duct dilation.  Normal spleen and pancreas. No adrenal masses. No pathologically enlarged lymph nodes. No abnormal fluid collections. The uterus is surgically absent. No pelvic masses.  Bowel is unremarkable.  A normal appendix is visualized.  There are degenerative changes throughout the visualized spine. No osteoblastic or osteolytic lesions.  IMPRESSION: 1. There are 2 obstructing stones in the right ureter. A 7 mm stone lies in the proximal right ureter just below the ureteropelvic junction. A 4 mm stone lies in the mid to distal right ureter at the level of the pelvic brim. Stones cause moderate right hydroureteronephrosis. 2. Small nonobstructing stones are noted in each kidney. 3. No other acute findings.   Electronically Signed   By: Amie Portland M.D.   On: 09/04/2013 10:03     EKG Interpretation None      MDM   Final diagnoses:  Ureterolithiasis  Hydronephrosis of right kidney    Transfer to [facility] ED to see Dr Margarita Grizzle for stenting.    Devoria Albe, MD, Armando Gang     Ward Givens, MD 09/04/13 [phone]

## 2013-09-04 NOTE — ED Provider Notes (Signed)
Pt transferred to [facility]rology [facility].  Pt stable in no distress.  Celene KrasJon R Angie Piercey, MD 09/04/13 [phone]

## 2013-09-04 NOTE — Transfer of Care (Signed)
Immediate Anesthesia Transfer of Care Note  Patient: Jasmine ForestLinda S Mccuiston  Procedure(s) Performed: Procedure(s) (LRB): CYSTOSCOPY WITH RETROGRADE PYELOGRAM/URETERAL STENT PLACEMENT (Right)  Patient Location: PACU  Anesthesia Type: General  Level of Consciousness: sedated, patient cooperative and responds to stimulation  Airway & Oxygen Therapy: Patient Spontanous Breathing and Patient connected to face mask oxgen  Post-op Assessment: Report given to PACU RN and Post -op Vital signs reviewed and stable  Post vital signs: Reviewed and stable  Complications: No apparent anesthesia complications

## 2013-09-06 ENCOUNTER — Encounter (HOSPITAL_COMMUNITY): Payer: Self-pay | Admitting: Urology

## 2013-09-10 ENCOUNTER — Encounter: Payer: Self-pay | Admitting: Cardiology

## 2013-09-10 DIAGNOSIS — E119 Type 2 diabetes mellitus without complications: Secondary | ICD-10-CM | POA: Insufficient documentation

## 2013-09-10 DIAGNOSIS — E669 Obesity, unspecified: Secondary | ICD-10-CM | POA: Insufficient documentation

## 2013-09-13 ENCOUNTER — Other Ambulatory Visit: Payer: Self-pay | Admitting: Urology

## 2013-09-23 ENCOUNTER — Encounter (HOSPITAL_BASED_OUTPATIENT_CLINIC_OR_DEPARTMENT_OTHER): Payer: Self-pay | Admitting: *Deleted

## 2013-09-23 NOTE — Progress Notes (Signed)
NPO AFTER MN WITH EXCEPTION CLEAR LIQUIDS UNTIL 0830 (NO CREAM/ MILK PRODUCTS).  ARRIVE AT 1300.  CURRENT LAB RESULTS IN CHART AND EPIC.  NEEDS EKG.  WILL TAKE GABAPENTIN AM DOS W/ SIPS OF WATER AND IF NEEDED MAY TAKE PERCOCET .  AT FIRST PT STATED NO RIDE HOME, BUT PT VERBALIZED UNDERSTANDING SHE IS HAVING GENERAL ANESTHESIA AND WOULD NOT BE DISCHARGED WITHOUT RESPONSIBLE PERSON TO TAKE HER HOME AND BE CAREGIVER 24 HOURS.  HER PLANS ARE TO DRIVE HERSELF , LEAVE HER CAR OUT FRONT AND HER DAUGHTER , [facility] [employer] WILL PICK HER UP AFTER SHE GETS OFF WORK AT 1700.

## 2013-09-27 ENCOUNTER — Ambulatory Visit (HOSPITAL_BASED_OUTPATIENT_CLINIC_OR_DEPARTMENT_OTHER)
Admission: RE | Admit: 2013-09-27 | Discharge: 2013-09-27 | Disposition: A | Discharge status: Home / Self Care | Payer: BC Managed Care – PPO | Source: Ambulatory Visit | Attending: Urology | Admitting: Urology

## 2013-09-27 ENCOUNTER — Encounter (HOSPITAL_BASED_OUTPATIENT_CLINIC_OR_DEPARTMENT_OTHER)
Admission: RE | Disposition: A | Discharge status: Home / Self Care | Payer: Self-pay | Source: Ambulatory Visit | Attending: Urology

## 2013-09-27 ENCOUNTER — Encounter (HOSPITAL_BASED_OUTPATIENT_CLINIC_OR_DEPARTMENT_OTHER): Payer: Self-pay | Admitting: *Deleted

## 2013-09-27 ENCOUNTER — Encounter (HOSPITAL_BASED_OUTPATIENT_CLINIC_OR_DEPARTMENT_OTHER): Payer: BC Managed Care – PPO | Admitting: Anesthesiology

## 2013-09-27 ENCOUNTER — Ambulatory Visit (HOSPITAL_BASED_OUTPATIENT_CLINIC_OR_DEPARTMENT_OTHER): Payer: BC Managed Care – PPO | Admitting: Anesthesiology

## 2013-09-27 ENCOUNTER — Ambulatory Visit: Payer: Self-pay | Admitting: Cardiology

## 2013-09-27 DIAGNOSIS — M129 Arthropathy, unspecified: Secondary | ICD-10-CM | POA: Insufficient documentation

## 2013-09-27 DIAGNOSIS — E119 Type 2 diabetes mellitus without complications: Secondary | ICD-10-CM | POA: Insufficient documentation

## 2013-09-27 DIAGNOSIS — N200 Calculus of kidney: Secondary | ICD-10-CM | POA: Insufficient documentation

## 2013-09-27 DIAGNOSIS — N201 Calculus of ureter: Secondary | ICD-10-CM | POA: Insufficient documentation

## 2013-09-27 DIAGNOSIS — J45909 Unspecified asthma, uncomplicated: Secondary | ICD-10-CM | POA: Insufficient documentation

## 2013-09-27 HISTORY — DX: Urgency of urination: R39.15

## 2013-09-27 HISTORY — PX: HOLMIUM LASER APPLICATION: SHX5852

## 2013-09-27 HISTORY — DX: Calculus of ureter: N20.1

## 2013-09-27 HISTORY — DX: Personal history of other infectious and parasitic diseases: Z86.19

## 2013-09-27 HISTORY — DX: Personal history of other diseases of the digestive system: Z87.19

## 2013-09-27 HISTORY — DX: Type 2 diabetes mellitus without complications: E11.9

## 2013-09-27 HISTORY — PX: CYSTOSCOPY WITH RETROGRADE PYELOGRAM, URETEROSCOPY AND STENT PLACEMENT: SHX5789

## 2013-09-27 HISTORY — DX: Calculus of kidney: N20.0

## 2013-09-27 HISTORY — DX: Personal history of urinary calculi: Z87.442

## 2013-09-27 LAB — GLUCOSE, CAPILLARY: Glucose-Capillary: 135 mg/dL — ABNORMAL HIGH (ref 70–99)

## 2013-09-27 LAB — POCT I-STAT 4, (NA,K, GLUC, HGB,HCT)
Glucose, Bld: 115 mg/dL — ABNORMAL HIGH (ref 70–99)
HCT: 36 % (ref 36.0–46.0)
Hemoglobin: 12.2 g/dL (ref 12.0–15.0)
POTASSIUM: 3.8 meq/L (ref 3.7–5.3)
Sodium: 140 mEq/L (ref 137–147)

## 2013-09-27 SURGERY — CYSTOURETEROSCOPY, WITH RETROGRADE PYELOGRAM AND STENT INSERTION
Anesthesia: General | Site: Ureter | Laterality: Right

## 2013-09-27 MED ORDER — BELLADONNA ALKALOIDS-OPIUM 16.2-60 MG RE SUPP
RECTAL | Status: AC
  Filled 2013-09-27: qty 1

## 2013-09-27 MED ORDER — HYDROCODONE-ACETAMINOPHEN 5-325 MG PO TABS
ORAL_TABLET | ORAL | Status: AC
  Filled 2013-09-27: qty 1

## 2013-09-27 MED ORDER — IOHEXOL 350 MG/ML SOLN
INTRAVENOUS | Status: DC | PRN
  Administered 2013-09-27: 10 mL

## 2013-09-27 MED ORDER — LACTATED RINGERS IV SOLN
INTRAVENOUS | Status: DC | PRN
  Administered 2013-09-27 (×2): via INTRAVENOUS

## 2013-09-27 MED ORDER — CEPHALEXIN 500 MG PO CAPS: 500.0000 mg | ORAL_CAPSULE | Freq: Three times a day (TID) | ORAL | Status: DC

## 2013-09-27 MED ORDER — PHENAZOPYRIDINE HCL 100 MG PO TABS
ORAL_TABLET | ORAL | Status: AC
  Filled 2013-09-27: qty 2

## 2013-09-27 MED ORDER — SENNOSIDES-DOCUSATE SODIUM 8.6-50 MG PO TABS: 1.0000 | ORAL_TABLET | Freq: Two times a day (BID) | ORAL | Status: DC

## 2013-09-27 MED ORDER — BELLADONNA ALKALOIDS-OPIUM 16.2-60 MG RE SUPP
RECTAL | Status: DC | PRN
  Administered 2013-09-27: 1 via RECTAL

## 2013-09-27 MED ORDER — KETOROLAC TROMETHAMINE 30 MG/ML IJ SOLN
INTRAMUSCULAR | Status: DC | PRN
Start: 2013-09-27 — End: 2013-09-27
  Administered 2013-09-27: 30 mg via INTRAVENOUS

## 2013-09-27 MED ORDER — FENTANYL CITRATE 0.05 MG/ML IJ SOLN
25.0000 ug | INTRAMUSCULAR | Status: DC | PRN
  Filled 2013-09-27: qty 1

## 2013-09-27 MED ORDER — DEXAMETHASONE SODIUM PHOSPHATE 4 MG/ML IJ SOLN
INTRAMUSCULAR | Status: DC | PRN
  Administered 2013-09-27: 10 mg via INTRAVENOUS

## 2013-09-27 MED ORDER — ACETAMINOPHEN 10 MG/ML IV SOLN
INTRAVENOUS | Status: DC | PRN
  Administered 2013-09-27: 1000 mg via INTRAVENOUS

## 2013-09-27 MED ORDER — MIDAZOLAM HCL 5 MG/5ML IJ SOLN
INTRAMUSCULAR | Status: DC | PRN
  Administered 2013-09-27 (×2): 1 mg via INTRAVENOUS

## 2013-09-27 MED ORDER — HYDROCODONE-ACETAMINOPHEN 5-325 MG PO TABS
1.0000 | ORAL_TABLET | ORAL | Status: DC | PRN
  Administered 2013-09-27: 1 via ORAL
  Filled 2013-09-27: qty 2

## 2013-09-27 MED ORDER — HYDROCODONE-ACETAMINOPHEN 5-325 MG PO TABS: 1.0000 | ORAL_TABLET | ORAL | Status: DC | PRN

## 2013-09-27 MED ORDER — OXYBUTYNIN CHLORIDE 5 MG PO TABS
ORAL_TABLET | ORAL | Status: AC
  Filled 2013-09-27: qty 1

## 2013-09-27 MED ORDER — SODIUM CHLORIDE 0.9 % IR SOLN
Status: DC | PRN
  Administered 2013-09-27: 4000 mL

## 2013-09-27 MED ORDER — ONDANSETRON HCL 4 MG/2ML IJ SOLN
INTRAMUSCULAR | Status: DC | PRN
  Administered 2013-09-27: 4 mg via INTRAVENOUS

## 2013-09-27 MED ORDER — PROMETHAZINE HCL 25 MG/ML IJ SOLN
6.2500 mg | INTRAMUSCULAR | Status: DC | PRN
  Filled 2013-09-27: qty 1

## 2013-09-27 MED ORDER — PROPOFOL 10 MG/ML IV BOLUS
INTRAVENOUS | Status: DC | PRN
  Administered 2013-09-27: 200 mg via INTRAVENOUS

## 2013-09-27 MED ORDER — CEFAZOLIN SODIUM-DEXTROSE 2-3 GM-% IV SOLR
2.0000 g | INTRAVENOUS | Status: AC
  Administered 2013-09-27: 2 g via INTRAVENOUS
  Filled 2013-09-27: qty 50

## 2013-09-27 MED ORDER — FENTANYL CITRATE 0.05 MG/ML IJ SOLN
INTRAMUSCULAR | Status: DC | PRN
  Administered 2013-09-27: 25 ug via INTRAVENOUS
  Administered 2013-09-27: 50 ug via INTRAVENOUS
  Administered 2013-09-27: 25 ug via INTRAVENOUS
  Administered 2013-09-27: 50 ug via INTRAVENOUS
  Administered 2013-09-27: 25 ug via INTRAVENOUS

## 2013-09-27 MED ORDER — LACTATED RINGERS IV SOLN
INTRAVENOUS | Status: DC
  Administered 2013-09-27: 12:00:00 via INTRAVENOUS
  Filled 2013-09-27: qty 1000

## 2013-09-27 MED ORDER — LIDOCAINE HCL (CARDIAC) 20 MG/ML IV SOLN
INTRAVENOUS | Status: DC | PRN
Start: 2013-09-27 — End: 2013-09-27
  Administered 2013-09-27: 60 mg via INTRAVENOUS

## 2013-09-27 MED ORDER — OXYBUTYNIN CHLORIDE 5 MG PO TABS
5.0000 mg | ORAL_TABLET | Freq: Four times a day (QID) | ORAL | Status: DC | PRN
  Administered 2013-09-27: 5 mg via ORAL
  Filled 2013-09-27: qty 1

## 2013-09-27 MED ORDER — PHENAZOPYRIDINE HCL 200 MG PO TABS
200.0000 mg | ORAL_TABLET | Freq: Three times a day (TID) | ORAL | Status: DC
  Administered 2013-09-27: 200 mg via ORAL
  Filled 2013-09-27: qty 1

## 2013-09-27 MED ORDER — FENTANYL CITRATE 0.05 MG/ML IJ SOLN
INTRAMUSCULAR | Status: AC
  Filled 2013-09-27: qty 4

## 2013-09-27 MED ORDER — MIDAZOLAM HCL 2 MG/2ML IJ SOLN
INTRAMUSCULAR | Status: AC
  Filled 2013-09-27: qty 2

## 2013-09-27 SURGICAL SUPPLY — 42 items
BAG DRAIN URO-CYSTO SKYTR STRL (DRAIN) ×2 IMPLANT
BAG DRN UROCATH (DRAIN) ×1
BASKET LASER NITINOL 1.9FR (BASKET) IMPLANT
BASKET STNLS GEMINI 4WIRE 3FR (BASKET) IMPLANT
BASKET ZERO TIP NITINOL 2.4FR (BASKET) IMPLANT
BSKT STON RTRVL 120 1.9FR (BASKET)
BSKT STON RTRVL GEM 120X11 3FR (BASKET)
BSKT STON RTRVL ZERO TP 2.4FR (BASKET)
CANISTER SUCT LVC 12 LTR MEDI- (MISCELLANEOUS) ×2 IMPLANT
CATH CLEAR GEL 3F BACKSTOP (CATHETERS) IMPLANT
CATH INTERMIT  6FR 70CM (CATHETERS) IMPLANT
CATH URET 5FR 28IN CONE TIP (BALLOONS)
CATH URET 5FR 28IN OPEN ENDED (CATHETERS) ×2 IMPLANT
CATH URET 5FR 70CM CONE TIP (BALLOONS) IMPLANT
CATH URET DUAL LUMEN 6-10FR 50 (CATHETERS) IMPLANT
CLOTH BEACON ORANGE TIMEOUT ST (SAFETY) ×2 IMPLANT
DRAPE CAMERA CLOSED 9X96 (DRAPES) ×2 IMPLANT
ELECT REM PT RETURN 9FT ADLT (ELECTROSURGICAL)
ELECTRODE REM PT RTRN 9FT ADLT (ELECTROSURGICAL) IMPLANT
EXTRACTOR STONE NITINOL NGAGE (UROLOGICAL SUPPLIES) ×1 IMPLANT
FIBER LASER FLEXIVA 200 (UROLOGICAL SUPPLIES) IMPLANT
FIBER LASER FLEXIVA 365 (UROLOGICAL SUPPLIES) IMPLANT
FIBER LASER TRAC TIP (UROLOGICAL SUPPLIES) ×1 IMPLANT
GLOVE BIO SURGEON STRL SZ7 (GLOVE) ×2 IMPLANT
GLOVE ECLIPSE 7.0 STRL STRAW (GLOVE) ×2 IMPLANT
GLOVE INDICATOR 7.5 STRL GRN (GLOVE) ×2 IMPLANT
GOWN PREVENTION PLUS LG XLONG (DISPOSABLE) ×2 IMPLANT
GUIDEWIRE 0.038 PTFE COATED (WIRE) IMPLANT
GUIDEWIRE ANG ZIPWIRE 038X150 (WIRE) IMPLANT
GUIDEWIRE STR DUAL SENSOR (WIRE) ×2 IMPLANT
IV NS IRRIG 3000ML ARTHROMATIC (IV SOLUTION) ×2 IMPLANT
KIT BALLIN UROMAX 15FX10 (LABEL) IMPLANT
KIT BALLN UROMAX 15FX4 (MISCELLANEOUS) IMPLANT
KIT BALLN UROMAX 26 75X4 (MISCELLANEOUS)
PACK CYSTOSCOPY (CUSTOM PROCEDURE TRAY) ×2 IMPLANT
SET HIGH PRES BAL DIL (LABEL)
SHEATH ACCESS URETERAL 38CM (SHEATH) IMPLANT
SHEATH ACCESS URETERAL 54CM (SHEATH) IMPLANT
SHEATH URET ACCESS 12FR/35CM (UROLOGICAL SUPPLIES) IMPLANT
SHEATH URET ACCESS 12FR/55CM (UROLOGICAL SUPPLIES) IMPLANT
STENT POLARIS LOOP 6FR X 24 CM (STENTS) ×1 IMPLANT
SYRINGE IRR TOOMEY STRL 70CC (SYRINGE) IMPLANT

## 2013-09-27 NOTE — Transfer of Care (Signed)
Immediate Anesthesia Transfer of Care Note  Patient: Jasmine ForestLinda S Mcclory  Procedure(s) Performed: Procedure(s): CYSTOSCOPY WITH RETROGRADE PYELOGRAM, URETEROSCOPY AND STENT EXCHANGE (Right) HOLMIUM LASER APPLICATION (Right)  Patient Location: PACU  Anesthesia Type:General  Level of Consciousness: awake, alert  and oriented  Airway & Oxygen Therapy: Patient Spontanous Breathing and Patient connected to face mask oxygen  Post-op Assessment: Report given to PACU RN  Post vital signs: Reviewed and stable  Complications: No apparent anesthesia complications

## 2013-09-27 NOTE — Anesthesia Procedure Notes (Signed)
Procedure Name: LMA Insertion Date/Time: 09/27/2013 1:20 PM Performed by: Jasmine PriestBEESON, [name] Pre-anesthesia Checklist: Patient identified, Emergency Drugs available, Suction available and Patient being monitored Patient Re-evaluated:Patient Re-evaluated prior to inductionOxygen Delivery Method: Circle System Utilized Preoxygenation: Pre-oxygenation with 100% oxygen Intubation Type: IV induction Ventilation: Mask ventilation without difficulty LMA: LMA inserted LMA Size: 4.0 Number of attempts: 1 Airway Equipment and Method: bite block Placement Confirmation: positive ETCO2 Tube secured with: Tape Dental Injury: Teeth and Oropharynx as per pre-operative assessment  Comments: Teeth poor condition- same condition after LMA placed. None loose. LB

## 2013-09-27 NOTE — Op Note (Signed)
Urology Operative Report  Date of Procedure: 09/27/13  Surgeon: Natalia Leatherwoodaniel Armon Orvis, MD Assistant:  None  Preoperative Diagnosis: Right ureter stones. Right nephrolithiasis. Postoperative Diagnosis:  Same  Procedure(s): Right ureteroscopy with laser lithotripsy and stone removal of right distal ureter stone. Right ureteroscopy with laser lithotripsy and stone removal of right proximal ureter stone. Right ureteroscopy with stone removal of right renal stones (multiple). Right retrograde pyelogram with interpretation. Right ureter stent placement (6 x 24 polaris without tether). Right ureter stent removal. Cystoscopy.  Estimated blood loss: None.  Specimen: Stones sent to [facility] lab for chemical analysis.  Drains: None  Complications: None  Findings: Right distal ureter stone. Right proximal ureter stone. 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 Small renal stones. Several old blood clots in the right renal collecting system. Negative filling defects or extravasation of retrograde pyelograms.  History of present illness: 58 year old female presents today for to obstructing right ureter stones as well as nephrolithiasis on the right side. She had a stent placed earlier last month. She presents today for interval ureteroscopy.   Procedure in detail: After informed consent was obtained, the patient was taken to the operating room. They were placed in the supine position. SCDs were turned on and in place. IV antibiotics were infused, and general anesthesia was induced. A timeout was performed in which the correct patient, surgical site, and procedure were identified and agreed upon by the team.  The patient was placed in a dorsolithotomy position, making sure to pad all pertinent neurovascular pressure points. A belladonna and opium suppository was placed into the rectum. The genitals were prepped and draped in the usual sterile fashion.  A rigid cystoscope was advanced through the  urethra and into the bladder. The bladder was drained and then attention was turned to the right ureter orifice. The stent was grasped and pulled through the urethra meatus. A sensor wire was placed up the stent under fluoroscopy into the right renal pelvis. The stent was then removed and secured as a safety wire.  I obtained a right retrograde pyelogram by cannulating the right ureter orifice with a 5 French ureter catheter and injecting 10 cc of Omnipaque. Contrast traveled a short distance up the ureter to the distal ureter stone and a small amount of contrast went beyond this. The right renal pelvis was not visualized with this retrograde pyelogram. A second sensor wire was placed through the 5 French catheter into the right renal pelvis on fluoroscopy.  I then placed the obturator to a 12-14 ureter access sheath into the distal ureter with ease and then the obturator with sheath over the wire under fluoroscopy. Operator and wire were removed in the flexible digital scope was advanced through the sheath and into the distal ureter. I then traveled a short distance encountered the distal ureter stone. This was too large to be pulled through the sheath. Lithotripsy was carried out with a 200  holmium laser filament at settings of 0.5 J and 20 Hz. Stones were broken up and then removed with a basket.  I then navigated flexible digital ureter scope up the ureter into the proximal ureter where the proximal ureter stone was encountered. I placed a second sensor wire through the ureter scope and removed the scope and access sheath. I then placed the obturator up over the wire under fluoroscopy up to the proximal stone with ease; I then placed the obturator and sheath over the working wire under fluoroscopy just distal to the proximal stone. The obturator  and working wire were removed. I advanced the digital ureter scope up the access sheath to where the stone was located. The stone was too large to be removed  with a basket. Lithotripsy was carried out with the holmium laser filament at 0.5 J and 20 Hz. The stone fragments were removed with a basket.  I navigated flexible digital scope into the renal pelvis. There was a large amount of old clot noted in this area. I placed a second sensor wire into the renal pelvis and removed the ureter scope. I then advanced the access sheath and obturator into the renal pelvis on fluoroscopy and removed the operator and working wire.  I then used a basket to remove the majority of the old clot. There was noted to be multiple Randall's plaques. There were several small stones which were grasped and removed and did not require lithotripsy.  I then withdrew the access sheath into the proximal ureter and placed the flexible digital scope proximal to this. I injected 10 cc of Omnipaque through the scope into the proximal ureter to obtain a second right retrograde pyelogram. This was negative for filling defects. The right renal pelvis was full as expected, but no filling defects were noted and there was no extravasation of contrast. This side drained well.  I then withdrew the ureter scope and access sheath to visualize the entire length of the ureter. This was negative for injury to the mucosa.  I elected to leave a stent in place and side using access sheath. I loaded the safety wire through the rigid cystoscope and placed a 6 x 24 Polaris stent without tether under fluoroscopy over the wire through the cystoscope. This was deployed with a good curl in the right renal pelvis and the loops in the bladder. The bladder was then drained and cystoscope was removed.  I placed 10 cc of lidocaine jelly into the urethra, and placed the patient back in a supine position, anesthesia was reversed and [facility].  All counts were correct at the end of the case.  To have the stent removed a followup appointment next week in my clinic. She'll be given  Keflex to start the day before stent removal.

## 2013-09-27 NOTE — H&P (Signed)
Urology History and Physical Exam  CC: Right ureter stone. Right nephrolithiasis.  HPI:  58 year old female presents today for right ureter stone.  She also has right nephrolithiasis.  She actually has 2 stones in her right ureter.  This is located in the proximal ureter.  They measure 0.7 cm in the proximal ureter.  The distal ureter stone is 0.4 cm in size.  She had a right ureter stent placed 09/04/13 for pain and nausea.  She is also noted to have at least 2 stones in the right kidney.  They measure 4 mm and 3 mm in size.  Nothing makes it better or worse.  After reviewing risks and benefits as well as treatment options she elected to proceed with interval ureteroscopy.  She presents today for cystoscopy, right ureteroscopy, laser lithotripsy, possible right retrograde program, and right ureter stent exchange.  We have discussed the risks, benefits, side effects, and likelihood of achieving goals.  She was cleared by her cardiologist, Dr. Donnie Ahoilley, on 09/10/13 with no further cardiac testing indicated.  Urine culture from 09/17/13 was negative for growth.   PMH: Past Medical History  Diagnosis Date  . Asthma   . Arthritis   . History of kidney stones   . Right ureteral stone   . Nephrolithiasis     BILATERAL  . History of shingles     2013  . History of GI bleed     2010--- UPPER ESOPHAGITIS/ DUODERAL ULCER/ EROSION  . Type 2 diabetes, diet controlled   . Urgency of urination     PSH: Past Surgical History  Procedure Laterality Date  . Total knee revision  02/25/2012    Procedure: TOTAL KNEE REVISION;  Surgeon: Cammy CopaGregory Scott Dean, MD;  [facility];  Service: Orthopedics;  Laterality: Right;  Revise right total knee arthroplasty  . Cystoscopy w/ ureteral stent placement Right 09/04/2013    Procedure: CYSTOSCOPY WITH RETROGRADE PYELOGRAM/URETERAL STENT PLACEMENT;  Surgeon: Milford Cageaniel Young Darius Lundberg, MD;  Location: [facility] ORS;  Service: Urology;  Laterality: Right;  . Tubal ligation  1990   . Hysteroscopy w/d&c  01-25-2003  . Laparoscopic assisted vaginal hysterectomy  03-02-2003    W/  BILATERAL SALPINGOOPHORECTOMY  . Nissen fundoplication  2000    W/  CHOLECYSTECTOMY  . Total knee arthroplasty Right 07-01-2006  . Laparoscopy  extensive lysis adhesions/  redo paraesophageal hiatal hernia with primary closure and mesh/ nissen fundoplication (1cm)/  repair gastrotomy  08-22-2009  . Shoulder arthroscopy with subacromial decompression, rotator cuff repair and bicep tendon repair Left 08-02-2010    AND LABRAL DEBRIDEMENT  . Cardiac catheterization  02-10-2009  DR BENSIMHON    NORMAL CORONARIES/  LOW NORMAL LVF WITHOUT WALL MOTION ABNORMALITIES  . Cardiac catheterization  11-21-2010  DR Jacinto HalimGANJI    NORMAL CORONARIES/  EF 50-55%  . Wrist fusion Left 2004    RETAINED HARDWARE    Allergies: No Known Allergies  Medications: No prescriptions prior to admission     Social History: History   Social History  . Marital Status: Widowed    Spouse Name: N/A    Number of Children: N/A  . Years of Education: N/A   Occupational History  . Not on file.   Social History Main Topics  . Smoking status: Never Smoker   . Smokeless tobacco: Never Used  . Alcohol Use: No  . Drug Use: No  . Sexual Activity: Not on file   Other Topics Concern  . Not on file   Social History  Narrative  . No narrative on file    Family History: History reviewed. No pertinent family history.  Review of Systems: Positive: Frequency, right flank pain. Negative: Fever, SOB, or chest pain.  A further 10 point review of systems was negative except what is listed in the HPI.  Physical Exam: Filed Vitals:   09/27/13 1121  BP: 114/69  Pulse: 67  Temp: 97.4 F (36.3 C)  Resp: 17    General: No acute distress.  Awake. Head:  Normocephalic.  Atraumatic. ENT:  EOMI.  Mucous membranes moist Neck:  Supple.  No lymphadenopathy. CV:  S1 present. S2 present. Regular rate. Pulmonary: Equal effort  bilaterally.  Clear to auscultation bilaterally. Abdomen: Soft.  Non- tender to palpation. Skin:  Normal turgor.  No visible rash. Extremity: No gross deformity of bilateral upper extremities.  No gross deformity of    bilateral lower extremities. Neurologic: Alert. Appropriate mood.    Studies:  No results found for this basename: HGB, WBC, PLT,  in the last 72 hours  No results found for this basename: NA, K, CL, CO2, BUN, CREATININE, CALCIUM, MAGNESIUM, GFRNONAA, GFRAA,  in the last 72 hours   No results found for this basename: PT, INR, APTT,  in the last 72 hours   No components found with this basename: ABG,     Assessment:  Right ureter stones. Right nephrolithiasis.  Plan: To OR for cystoscopy, right ureteroscopy, laser lithotripsy, possible right retrograde program, and right ureter stent exchange.

## 2013-09-27 NOTE — Discharge Instructions (Signed)
DISCHARGE INSTRUCTIONS FOR KIDNEY STONES OR URETERAL STENT ° °MEDICATIONS:  ° °1.Resume all your other meds from home. ° °ACTIVITY °1. No strenuous activity x 1week °2. No driving while on narcotic pain medications °3. Drink plenty of water °4. Continue to walk at home - you can still get blood clots when you are at home, so keep active, but don't over do it. °5. May return to work in 3 days. ° °BATHING °1. You can shower or take a bath. °SIGNS/SYMPTOMS TO CALL: °1. Please call us if you have a fever greater than 101.5, uncontrolled  °nausea/vomiting, uncontrolled pain, dizziness, unable to urinate, chest pain, shortness of breath, leg swelling, leg pain, redness around wound, drainage from wound, or any other concerns or questions. ° °You can reach us at [phone]. ° °[facility] °[phone] °Post Ureteroscopy With or Without Stent Instructions ° °Definitions: ° °Ureter: The duct that transports urine from the kidney to the bladder. °Stent:   A plastic hollow tube that is placed into the ureter, from the kidney to the                 bladder to prevent the ureter from swelling shut. ° °GENERAL INSTRUCTIONS: ° °Despite the fact that no skin incisions were used, the area around the ureter and bladder is raw and irritated. The stent is a foreign body which will further irritate the bladder wall. This irritation is manifested by increased frequency of urination, both day and night, and by an increase in the urge to urinate. In some, the urge to urinate is present almost always. Sometimes the urge is strong enough that you may not be able to stop yourself from urinating. The only real cure is to remove the stent and then give time for the bladder wall to heal which can't be done until the danger of the ureter swelling shut has passed, which varies. ° °You may see some blood in your urine while the stent is in place and a few days afterwards. Do not be alarmed, even if the urine was clear for a  while. Get off your feet and drink lots of fluids until clearing occurs. If you start to pass clots or don't improve, call us. ° °DIET: °You may return to your normal diet immediately. Because of the raw surface of your bladder, alcohol, spicy foods, acid type foods and drinks with caffeine may cause irritation or frequency and should be used in moderation. To keep your urine flowing freely and to avoid constipation, drink plenty of fluids during the day ( 8-10 glasses ). °Tip: Avoid cranberry juice because it is very acidic. ° °ACTIVITY: °Your physical activity doesn't need to be restricted. However, if you are very active, you may see some blood in your urine. We suggest that you reduce your activity under these circumstances until the bleeding has stopped. ° °BOWELS: °It is important to keep your bowels regular during the postoperative period. Straining with bowel movements can cause bleeding. A bowel movement every other day is reasonable. Use a mild laxative if needed, such as Milk of Magnesia 2-3 tablespoons, or 2 Dulcolax tablets. Call if you continue to have problems. If you have been taking narcotics for pain, before, during or after your surgery, you may be constipated. Take a laxative if necessary. ° °MEDICATION: °You should resume your pre-surgery medications unless told not to. In addition you will often be given an antibiotic to prevent infection. These should be taken as prescribed until the bottles   are finished unless you are having an unusual reaction to one of the drugs. ° °PROBLEMS YOU SHOULD REPORT TO US: °· Fevers over 100.5 Fahrenheit. °· Heavy bleeding, or clots ( See above notes about blood in urine ). °· Inability to urinate. °· Drug reactions ( hives, rash, nausea, vomiting, diarrhea ). °· Severe burning or pain with urination that is not improving. ° °FOLLOW-UP: °You will need a follow-up appointment to monitor your progress. Call for this appointment at the number listed above. Usually  the first appointment will be about three to fourteen days after your surgery. ° ° °Post Anesthesia Home Care Instructions ° °Activity: °Get plenty of rest for the remainder of the day. A responsible adult should stay with you for 24 hours following the procedure.  °For the next 24 hours, DO NOT: °-Drive a car °-Operate machinery °-Drink alcoholic beverages °-Take any medication unless instructed by your physician °-Make any legal decisions or sign important papers. ° °Meals: °Start with liquid foods such as gelatin or soup. Progress to regular foods as tolerated. Avoid greasy, spicy, heavy foods. If nausea and/or vomiting occur, drink only clear liquids until the nausea and/or vomiting subsides. Call your physician if vomiting continues. ° °Special Instructions/Symptoms: °Your throat may feel dry or sore from the anesthesia or the breathing tube placed in your throat during surgery. If this causes discomfort, gargle with warm salt water. The discomfort should disappear within 24 hours. ° °

## 2013-09-27 NOTE — Anesthesia Preprocedure Evaluation (Addendum)
Anesthesia Evaluation  Patient identified by MRN, date of birth, ID band Patient awake    Reviewed: Allergy & Precautions, H&P , NPO status , Patient's Chart, lab work & pertinent test results  Airway Mallampati: II TM Distance: >3 FB Neck ROM: Full    Dental no notable dental hx.    Pulmonary asthma ,  breath sounds clear to auscultation  Pulmonary exam normal       Cardiovascular negative cardio ROS  Rhythm:Regular Rate:Normal     Neuro/Psych negative neurological ROS  negative psych ROS   GI/Hepatic Neg liver ROS, GERD-  ,  Endo/Other  diabetes, Type 2  Renal/GU Renal disease  negative genitourinary   Musculoskeletal negative musculoskeletal ROS (+)   Abdominal   Peds negative pediatric ROS (+)  Hematology negative hematology ROS (+)   Anesthesia Other Findings   Reproductive/Obstetrics negative OB ROS                          Anesthesia Physical Anesthesia Plan  ASA: II  Anesthesia Plan: General   Post-op Pain Management:    Induction: Intravenous  Airway Management Planned: LMA  Additional Equipment:   Intra-op Plan:   Post-operative Plan: Extubation in OR  Informed Consent: I have reviewed the patients History and Physical, chart, labs and discussed the procedure including the risks, benefits and alternatives for the proposed anesthesia with the patient or authorized representative who has indicated his/her understanding and acceptance.   Dental advisory given  Plan Discussed with: CRNA  Anesthesia Plan Comments:         Anesthesia Quick Evaluation

## 2013-09-28 ENCOUNTER — Encounter (HOSPITAL_BASED_OUTPATIENT_CLINIC_OR_DEPARTMENT_OTHER): Payer: Self-pay | Admitting: Urology

## 2013-09-28 NOTE — Anesthesia Postprocedure Evaluation (Signed)
Anesthesia Post Note  Patient: Jasmine ForestLinda S Anderson  Procedure(s) Performed: Procedure(s) (LRB): CYSTOSCOPY WITH RETROGRADE PYELOGRAM, URETEROSCOPY AND STENT EXCHANGE (Right) HOLMIUM LASER APPLICATION (Right)  Anesthesia type: General  Patient location: PACU  Post pain: Pain level controlled  Post assessment: Post-op Vital signs reviewed  Last Vitals: BP 110/68  Pulse 69  Temp(Src) 36.8 C (Oral)  Resp 18  Ht 5\' 3"  (1.6 m)  Wt 180 lb (81.647 kg)  BMI 31.89 kg/m2  SpO2 94%  Post vital signs: Reviewed  Level of consciousness: sedated  Complications: No apparent anesthesia complications

## 2013-10-01 ENCOUNTER — Ambulatory Visit: Payer: Self-pay | Admitting: Cardiology

## 2014-07-19 ENCOUNTER — Encounter: Payer: Self-pay | Admitting: Gastroenterology

## 2014-10-28 ENCOUNTER — Encounter: Payer: Self-pay | Admitting: Gastroenterology

## 2015-05-11 ENCOUNTER — Other Ambulatory Visit: Payer: Self-pay | Admitting: Physical Medicine and Rehabilitation

## 2015-05-11 ENCOUNTER — Ambulatory Visit
Admission: RE | Admit: 2015-05-11 | Discharge: 2015-05-11 | Disposition: A | Discharge status: Home / Self Care | Payer: BLUE CROSS/BLUE SHIELD | Source: Ambulatory Visit | Attending: Physical Medicine and Rehabilitation | Admitting: Physical Medicine and Rehabilitation

## 2015-05-11 DIAGNOSIS — M1712 Unilateral primary osteoarthritis, left knee: Secondary | ICD-10-CM

## 2016-08-06 ENCOUNTER — Telehealth (INDEPENDENT_AMBULATORY_CARE_PROVIDER_SITE_OTHER): Payer: Self-pay | Admitting: Orthopedic Surgery

## 2016-08-06 NOTE — Telephone Encounter (Signed)
Pt called and stated she had the dental work done today that Dr. August Saucerean requested for her to have done before she can have surgery. Pt is wanting to schedule surgery now. I advised pt I would have to see if Dr. August Saucerean would like for her to come in for an appt since it has been a year since we've seen her or if I could go ahead and schedule her surgery. Will give pt a call back once Dr. August Saucerean has advised.

## 2016-08-15 ENCOUNTER — Telehealth (INDEPENDENT_AMBULATORY_CARE_PROVIDER_SITE_OTHER): Payer: Self-pay | Admitting: Orthopedic Surgery

## 2016-08-15 ENCOUNTER — Other Ambulatory Visit (INDEPENDENT_AMBULATORY_CARE_PROVIDER_SITE_OTHER): Payer: Self-pay | Admitting: Orthopedic Surgery

## 2016-08-15 DIAGNOSIS — M1712 Unilateral primary osteoarthritis, left knee: Secondary | ICD-10-CM

## 2016-08-15 NOTE — Telephone Encounter (Signed)
ERROR

## 2016-09-09 ENCOUNTER — Other Ambulatory Visit (INDEPENDENT_AMBULATORY_CARE_PROVIDER_SITE_OTHER): Payer: Self-pay | Admitting: Orthopedic Surgery

## 2016-09-16 ENCOUNTER — Telehealth (INDEPENDENT_AMBULATORY_CARE_PROVIDER_SITE_OTHER): Payer: Self-pay | Admitting: Orthopedic Surgery

## 2016-09-16 NOTE — Telephone Encounter (Signed)
Patient called advised she don't know how much longer she can stand on her knee. Patient said the pain medicine is no longer helping her with the pain. Patient asked if Dr August Saucer can take her out of work. The number to contact patient is [phone]

## 2016-09-16 NOTE — Telephone Encounter (Signed)
Okay to take her out of work please call thanks

## 2016-09-16 NOTE — Telephone Encounter (Signed)
Please advise. Patient requesting work note stating she cannot stand on knee. She wants to be taken out of work

## 2016-09-17 NOTE — Telephone Encounter (Signed)
Note was written for 3 months she patient has upcoming surgery scheduled for May. I have printed and put at front desk for her to pick kup

## 2016-09-17 NOTE — Telephone Encounter (Signed)
Called and LM for patient to call back to discuss length of time she was needing to be taken out of work.

## 2016-09-26 ENCOUNTER — Encounter (HOSPITAL_COMMUNITY): Payer: Self-pay | Admitting: Vascular Surgery

## 2016-09-26 ENCOUNTER — Encounter (HOSPITAL_COMMUNITY)
Admission: RE | Admit: 2016-09-26 | Discharge: 2016-09-26 | Disposition: A | Discharge status: Home / Self Care | Payer: BLUE CROSS/BLUE SHIELD | Source: Ambulatory Visit | Attending: Orthopedic Surgery | Admitting: Orthopedic Surgery

## 2016-09-26 ENCOUNTER — Encounter (HOSPITAL_COMMUNITY): Payer: Self-pay

## 2016-09-26 ENCOUNTER — Ambulatory Visit (HOSPITAL_COMMUNITY)
Admission: RE | Admit: 2016-09-26 | Discharge: 2016-09-26 | Disposition: A | Discharge status: Home / Self Care | Payer: BLUE CROSS/BLUE SHIELD | Source: Ambulatory Visit | Attending: Orthopedic Surgery | Admitting: Orthopedic Surgery

## 2016-09-26 DIAGNOSIS — M1712 Unilateral primary osteoarthritis, left knee: Secondary | ICD-10-CM | POA: Insufficient documentation

## 2016-09-26 DIAGNOSIS — Z01812 Encounter for preprocedural laboratory examination: Secondary | ICD-10-CM | POA: Insufficient documentation

## 2016-09-26 DIAGNOSIS — R918 Other nonspecific abnormal finding of lung field: Secondary | ICD-10-CM | POA: Diagnosis not present

## 2016-09-26 DIAGNOSIS — Z01810 Encounter for preprocedural cardiovascular examination: Secondary | ICD-10-CM | POA: Insufficient documentation

## 2016-09-26 DIAGNOSIS — Z419 Encounter for procedure for purposes other than remedying health state, unspecified: Secondary | ICD-10-CM

## 2016-09-26 HISTORY — DX: Personal history of other diseases of the digestive system: Z87.19

## 2016-09-26 LAB — CBC
HCT: 40.8 % (ref 36.0–46.0)
Hemoglobin: 12.8 g/dL (ref 12.0–15.0)
MCH: 29.2 pg (ref 26.0–34.0)
MCHC: 31.4 g/dL (ref 30.0–36.0)
MCV: 92.9 fL (ref 78.0–100.0)
PLATELETS: 220 10*3/uL (ref 150–400)
RBC: 4.39 MIL/uL (ref 3.87–5.11)
RDW: 13.3 % (ref 11.5–15.5)
WBC: 7 10*3/uL (ref 4.0–10.5)

## 2016-09-26 LAB — SURGICAL PCR SCREEN
MRSA, PCR: NEGATIVE
Staphylococcus aureus: NEGATIVE

## 2016-09-26 LAB — GLUCOSE, CAPILLARY: GLUCOSE-CAPILLARY: 366 mg/dL — AB (ref 65–99)

## 2016-09-26 LAB — URINALYSIS, ROUTINE W REFLEX MICROSCOPIC
Bilirubin Urine: NEGATIVE
Glucose, UA: >=500 mg/dL — AB
Ketones, ur: NEGATIVE mg/dL
Nitrite: NEGATIVE
PROTEIN: 100 mg/dL — AB
SPECIFIC GRAVITY, URINE: 1.024 (ref 1.005–1.030)
pH: 5 (ref 5.0–8.0)

## 2016-09-26 LAB — BASIC METABOLIC PANEL
Anion gap: 7 (ref 5–15)
BUN: 14 mg/dL (ref 6–20)
CALCIUM: 9.8 mg/dL (ref 8.9–10.3)
CHLORIDE: 97 mmol/L — AB (ref 101–111)
CO2: 32 mmol/L (ref 22–32)
CREATININE: 0.81 mg/dL (ref 0.44–1.00)
GFR calc non Af Amer: >60 mL/min (ref >60)
Glucose, Bld: 310 mg/dL — ABNORMAL HIGH (ref 65–99)
Potassium: 4.3 mmol/L (ref 3.5–5.1)
SODIUM: 136 mmol/L (ref 135–145)

## 2016-09-26 NOTE — Pre-Procedure Instructions (Signed)
    [name]  09/26/2016      [id number] [place], [place] - [address] [place] [address] Phone: [phone] Fax: [phone]  [facility] [address] - [place], [place] - [id number] [place] [address] Phone: [phone] Fax: [phone]    Your procedure is scheduled on Tuesday, May 15th   [facility] at 5:30 AM             (posted surgery time 7:30 - 10:35 am)   Call this number if you have problems [facility]:  [phone], other questions, call  [phone] Mon-Fri from 8-4:30pm   Remember:  Do not eat food or drink liquids after midnight Monday.   Take these medicines the morning of surgery with A SIP OF WATER : Gabapentin, Tizanidine, Pain medication.   Please use your nebulizer the day of surgery.                          4-5 days prior to surgery, STOP TAKING any vitamins, herbal supplements, anti-inflammatories.   Do not wear jewelry, make-up or nail polish.  Do not wear lotions, powders,  perfumes, or deoderant.  Do not shave underarms & legs 48 hours prior to surgery.     Do not bring valuables to the hospital.  [facility] is not responsible for any belongings or valuables.  Contacts, dentures or bridgework may not be worn into surgery.  Leave your suitcase in the car.  After surgery it may be brought to your room.  For patients admitted to the hospital, discharge time will be determined by your treatment team.  Please read over the following fact sheets that you were given. Pain Booklet, MRSA Information and Surgical Site Infection Prevention

## 2016-09-26 NOTE — Progress Notes (Signed)
Does she have urine culture

## 2016-09-26 NOTE — Progress Notes (Signed)
PCP: none at present. Pt. Used to see [facility]. Last visit 2-3 yrs. Ago  Cardiologist: Dr. Jacinto HalimGanji last seen 2012  [facility]: sees Dr. Wilhelmenia BlaseMary Bodea  Pt. Reports she doesn't check blood sugars, doesn't have a machine. Diabetes has been diet controlled.  Stated she had eaten cinnamon oatmeal and drank some tea for meds.   Violet BaldyAllison Zelenack,PA notified.   Instructed pt. To contact [facility]. Dicussed with pt. Having good control of blood sugar and having a PCP to monitor her. Pt. Stated she would.

## 2016-09-26 NOTE — Progress Notes (Signed)
Patient needs hemoglobin A1c checked today or tomorrow because that may prevent her from having surgery if it's over 8.0

## 2016-09-27 ENCOUNTER — Telehealth (INDEPENDENT_AMBULATORY_CARE_PROVIDER_SITE_OTHER): Payer: Self-pay | Admitting: Orthopedic Surgery

## 2016-09-27 LAB — URINE CULTURE

## 2016-09-27 LAB — HEMOGLOBIN A1C
HEMOGLOBIN A1C: 10 % — AB (ref 4.8–5.6)
Mean Plasma Glucose: 240 mg/dL

## 2016-09-27 NOTE — Telephone Encounter (Signed)
LVM with pt to give me a call as soon as possible so I can inform her of her surgery cancellation due to A1C being 10.0. Will try to contact pt again at a later time.

## 2016-10-03 ENCOUNTER — Telehealth (INDEPENDENT_AMBULATORY_CARE_PROVIDER_SITE_OTHER): Payer: Self-pay | Admitting: Orthopedic Surgery

## 2016-10-03 ENCOUNTER — Encounter (INDEPENDENT_AMBULATORY_CARE_PROVIDER_SITE_OTHER): Payer: Self-pay

## 2016-10-03 NOTE — Telephone Encounter (Signed)
Patient called asked if she can get a return to work note. Patient said she will pick up the note. The number to contact patient is [phone]

## 2016-10-03 NOTE — Telephone Encounter (Signed)
Note written. IC LMVM advising could pick up at front desk.

## 2016-10-04 ENCOUNTER — Encounter: Payer: Self-pay | Admitting: Internal Medicine

## 2016-10-04 ENCOUNTER — Ambulatory Visit (INDEPENDENT_AMBULATORY_CARE_PROVIDER_SITE_OTHER): Payer: BLUE CROSS/BLUE SHIELD | Admitting: Internal Medicine

## 2016-10-04 VITALS — BP 112/78 | HR 76 | Temp 97.8°F | Ht 63.0 in | Wt 176.0 lb

## 2016-10-04 DIAGNOSIS — Z87442 Personal history of urinary calculi: Secondary | ICD-10-CM | POA: Diagnosis not present

## 2016-10-04 DIAGNOSIS — G4701 Insomnia due to medical condition: Secondary | ICD-10-CM | POA: Diagnosis not present

## 2016-10-04 DIAGNOSIS — G8929 Other chronic pain: Secondary | ICD-10-CM | POA: Diagnosis not present

## 2016-10-04 DIAGNOSIS — Z23 Encounter for immunization: Secondary | ICD-10-CM

## 2016-10-04 DIAGNOSIS — J4520 Mild intermittent asthma, uncomplicated: Secondary | ICD-10-CM

## 2016-10-04 DIAGNOSIS — E119 Type 2 diabetes mellitus without complications: Secondary | ICD-10-CM | POA: Diagnosis not present

## 2016-10-04 DIAGNOSIS — M2550 Pain in unspecified joint: Secondary | ICD-10-CM

## 2016-10-04 DIAGNOSIS — K219 Gastro-esophageal reflux disease without esophagitis: Secondary | ICD-10-CM

## 2016-10-04 MED ORDER — GLIPIZIDE 10 MG PO TABS: ORAL_TABLET | ORAL | 2 refills | Status: DC

## 2016-10-04 NOTE — Progress Notes (Signed)
HPI  Pt presents to the clinic today to establish care and for management of the conditions listed below. She has not had a PCP in many years.  Arthritis: All over. She was due to have a knee replacement but it was cancelled due to newly diagnosed DM 2. She is taking Baclofen, Neurontin, Meloxicam, MS Continue, Percocet and Zanaflex. She follows with [facility].  Asthma: She uses her Albuterol on an as needed basis only. She reports the inhaler she has is old and is requesting a refill today.  GERD: No issues after Nissen Fundoplication in 2000.  History of Kidney Stones: > 2yeas ago. She has had multiple lithotripsies. She does not see urology on a regular basis.  DM 2: Just diagnosed at her preop visit. She reports her A1C was 10%. Her sugars range 187- 320. She has not been started on any hypoglycemic medication at this time. She has not had any diabetic education.  Insomnia: Secondary to chronic pain. She takes Ambien nightly (prescribed by pain management).  Flu: 03/2016 Tetanus: > 10 years ago Pneumovax: 2013 Pap Smear: 2003, total hysterectomy Mammogram: never Colon Screening: 2010, normal Vision Screening: as needed Dentist: as needed  Past Medical History:  Diagnosis Date  . Arthritis   . Asthma   . History of GI bleed    2010--- UPPER ESOPHAGITIS/ DUODERAL ULCER/ EROSION  . History of hiatal hernia   . History of kidney stones   . History of shingles    2013  . Nephrolithiasis    BILATERAL  . Right ureteral stone   . Type 2 diabetes, diet controlled (HCC)   . Urgency of urination     Current Outpatient Prescriptions  Medication Sig Dispense Refill  . albuterol (PROVENTIL HFA;VENTOLIN HFA) 108 (90 BASE) MCG/ACT inhaler Inhale 2 puffs into the lungs every 4 (four) hours as needed for wheezing or shortness of breath. For shortness of breath     . albuterol (PROVENTIL) (2.5 MG/3ML) 0.083% nebulizer solution Take 2.5 mg by nebulization every 6 (six)  hours as needed for wheezing or shortness of breath.    . baclofen (LIORESAL) 10 MG tablet Take 5 mg by mouth 3 (three) times daily as needed for muscle spasms.    Marland Kitchen. gabapentin (NEURONTIN) 400 MG capsule Take 800 mg by mouth 4 (four) times daily.     . meloxicam (MOBIC) 15 MG tablet Take 15 mg by mouth daily.    Marland Kitchen. morphine (MS CONTIN) 30 MG 12 hr tablet Take 30 mg by mouth 2 (two) times daily.    Marland Kitchen. oxyCODONE-acetaminophen (PERCOCET) 10-325 MG tablet Take 1 tablet by mouth 4 (four) times daily as needed. For breakthrough pain.    Marland Kitchen. tiZANidine (ZANAFLEX) 2 MG tablet Take 2 mg by mouth 3 (three) times daily as needed for spasms.    Marland Kitchen. zolpidem (AMBIEN) 5 MG tablet Take 5 mg by mouth at bedtime.     No current facility-administered medications for this visit.     No Known Allergies  Family History  Problem Relation Age of Onset  . Heart disease Father     Social History   Social History  . Marital status: Widowed    Spouse name: N/A  . Number of children: N/A  . Years of education: N/A   Occupational History  . Not on file.   Social History Main Topics  . Smoking status: Never Smoker  . Smokeless tobacco: Never Used  . Alcohol use No  . Drug use:  No  . Sexual activity: Not on file   Other Topics Concern  . Not on file   Social History Narrative  . No narrative on file    ROS:  Constitutional: Denies fever, malaise, fatigue, headache or abrupt weight changes.  HEENT: Denies eye pain, eye redness, ear pain, ringing in the ears, wax buildup, runny nose, nasal congestion, bloody nose, or sore throat. Respiratory: Denies difficulty breathing, shortness of breath, cough or sputum production.   Cardiovascular: Denies chest pain, chest tightness, palpitations or swelling in the hands or feet.  Gastrointestinal: Pt reports increased thirst. Denies abdominal pain, bloating, constipation, diarrhea or blood in the stool.  GU: Denies frequency, urgency, pain with urination, blood in  urine, odor or discharge. Musculoskeletal: Pt reports chronic joint pain. Denies decrease in range of motion, difficulty with gait, muscle pain or joint swelling.  Skin: Denies redness, rashes, lesions or ulcercations.  Neurological: Pt reports insomnia. Denies dizziness, difficulty with memory, difficulty with speech or problems with balance and coordination.  Psych: Denies anxiety, depression, SI/HI.  No other specific complaints in a complete review of systems (except as listed in HPI above).  PE:  BP 112/78   Pulse 76   Temp 97.8 F (36.6 C) (Oral)   Ht 5\' 3"  (1.6 m)   Wt 176 lb (79.8 kg)   SpO2 96%   BMI 31.18 kg/m  Wt Readings from Last 3 Encounters:  10/04/16 176 lb (79.8 kg)  09/26/16 176 lb 2.4 oz (79.9 kg)  09/23/13 180 lb (81.6 kg)    General: Appears her stated age, obese in NAD. Skin: Dry and intact. No ulcerations noted. Cardiovascular: Normal rate and rhythm. S1,S2 noted.  No murmur, rubs or gallops noted.  Pulmonary/Chest: Normal effort and positive vesicular breath sounds. No respiratory distress. No wheezes, rales or ronchi noted.  Abdomen: Soft and nontender. Normal bowel sounds. No distention or masses noted.  Musculoskeletal: No difficulty with gait.  Neurological: Alert and oriented. Sensation intact to BLE. Psychiatric: Mood and affect normal. Behavior is normal. Judgment and thought content normal.    BMET    Component Value Date/Time   NA 136 09/26/2016 1035   K 4.3 09/26/2016 1035   CL 97 (L) 09/26/2016 1035   CO2 32 09/26/2016 1035   GLUCOSE 310 (H) 09/26/2016 1035   BUN 14 09/26/2016 1035   CREATININE 0.81 09/26/2016 1035   CALCIUM 9.8 09/26/2016 1035   GFRNONAA >60 09/26/2016 1035   GFRAA >60 09/26/2016 1035    Lipid Panel     Component Value Date/Time   CHOL 228 (H) 11/21/2010 1505   TRIG 543 (H) 11/21/2010 1505   HDL 36 (L) 11/21/2010 1505   CHOLHDL 6.3 11/21/2010 1505   VLDL UNABLE TO CALCULATE IF TRIGLYCERIDE OVER 400 mg/dL  16/02/9603 5409   LDLCALC UNABLE TO CALCULATE IF TRIGLYCERIDE OVER 400 mg/dL 81/19/1478 2956    CBC    Component Value Date/Time   WBC 7.0 09/26/2016 1035   RBC 4.39 09/26/2016 1035   HGB 12.8 09/26/2016 1035   HCT 40.8 09/26/2016 1035   PLT 220 09/26/2016 1035   MCV 92.9 09/26/2016 1035   MCH 29.2 09/26/2016 1035   MCHC 31.4 09/26/2016 1035   RDW 13.3 09/26/2016 1035   LYMPHSABS 1.7 09/04/2013 0921   MONOABS 0.3 09/04/2013 0921   EOSABS 0.0 09/04/2013 0921   BASOSABS 0.0 09/04/2013 0921    Hgb A1C Lab Results  Component Value Date   HGBA1C 10.0 (H) 09/26/2016  Assessment and Plan:

## 2016-10-04 NOTE — Patient Instructions (Signed)
Diabetes Mellitus and Standards of Medical Care Managing diabetes (diabetes mellitus) can be complicated. Your diabetes treatment may be managed by a team of health care providers, including:  A diet and nutrition specialist (registered dietitian).  A nurse.  A certified diabetes educator (CDE).  A diabetes specialist (endocrinologist).  An eye doctor.  A primary care provider.  A dentist. Your health care providers follow a schedule in order to help you get the best quality of care. The following schedule is a general guideline for your diabetes management plan. Your health care providers may also give you more specific instructions. HbA1c ( hemoglobin A1c) test This test provides information about blood sugar (glucose) control over the previous 2-3 months. It is used to check whether your diabetes management plan needs to be adjusted.  If you are meeting your treatment goals, this test is done at least 2 times a year.  If you are not meeting treatment goals or if your treatment goals have changed, this test is done 4 times a year. Blood pressure test  This test is done at every routine medical visit. For most people, the goal is less than 130/80. Ask your health care provider what your goal blood pressure should be. Dental and eye exams  Visit your dentist two times a year.  If you have type 1 diabetes, get an eye exam 3-5 years after you are diagnosed, and then once a year after your first exam.  If you were diagnosed with type 1 diabetes as a child, get an eye exam when you are age 10 or older and have had diabetes for 3-5 years. After the first exam, you should get an eye exam once a year.  If you have type 2 diabetes, have an eye exam as soon as you are diagnosed, and then once a year after your first exam. Foot care exam  Visual foot exams are done at every routine medical visit. The exams check for cuts, bruises, redness, blisters, sores, or other problems with the  feet.  A complete foot exam is done by your health care provider once a year. This exam includes an inspection of the structure and skin of your feet, and a check of the pulses and sensation in your feet.  Type 1 diabetes: Get your first exam 3-5 years after diagnosis.  Type 2 diabetes: Get your first exam as soon as you are diagnosed.  Check your feet every day for cuts, bruises, redness, blisters, or sores. If you have any of these or other problems that are not healing, contact your health care provider. Kidney function test ( urine microalbumin)  This test is done once a year.  Type 1 diabetes: Get your first test 5 years after diagnosis.  Type 2 diabetes: Get your first test as soon as you are diagnosed.  If you have chronic kidney disease (CKD), get a serum creatinine and estimated glomerular filtration rate (eGFR) test once a year. Lipid profile (cholesterol, HDL, LDL, triglycerides)  This test should be done when you are diagnosed with diabetes, and every 5 years after the first test. If you are on medicines to lower your cholesterol, you may need to get this test done every year.  The goal for LDL is less than 100 mg/dL (5.5 mmol/L). If you are at high risk, the goal is less than 70 mg/dL (3.9 mmol/L).    The goal for triglycerides is less than 150 mg/dL (8.3 mmol/L). Immunizations  The yearly flu (influenza) vaccine is   recommended for everyone 6 months or older who has diabetes.  The pneumonia (pneumococcal) vaccine is recommended for everyone 2 years or older who has diabetes. If you are 59 or older, you may get the pneumonia vaccine as a series of two separate shots.  The hepatitis B vaccine is recommended for adults shortly after they have been diagnosed with diabetes.     Mental and emotional health  Screening for symptoms of eating disorders, anxiety, and depression is recommended at the time of diagnosis and afterward as needed. If your screening shows that  you have symptoms (you have a positive screening result), you may need further evaluation and be referred to a mental health care provider. Diabetes self-management education  Education about how to manage your diabetes is recommended at diagnosis and ongoing as needed. Treatment plan  Your treatment plan will be reviewed at every medical visit. Summary  Managing diabetes (diabetes mellitus) can be complicated. Your diabetes treatment may be managed by a team of health care providers.  Your health care providers follow a schedule in order to help you get the best quality of care.  Standards of care including having regular physical exams, blood tests, blood pressure monitoring, immunizations, screening tests, and education about how to manage your diabetes.  Your health care providers may also give you more specific instructions based on your individual health. This information is not intended to replace advice given to you by your health care provider. Make sure you discuss any questions you have with your health care provider. Document Released: 03/10/2009 Document Revised: 02/09/2016 Document Reviewed: 02/09/2016 Elsevier Interactive Patient Education  2017 [employer].

## 2016-10-08 ENCOUNTER — Inpatient Hospital Stay (HOSPITAL_COMMUNITY)
Admission: RE | Admit: 2016-10-08 | Payer: BLUE CROSS/BLUE SHIELD | Source: Ambulatory Visit | Admitting: Orthopedic Surgery

## 2016-10-08 ENCOUNTER — Encounter (HOSPITAL_COMMUNITY): Admission: RE | Payer: Self-pay | Source: Ambulatory Visit

## 2016-10-08 ENCOUNTER — Encounter: Payer: Self-pay | Admitting: Internal Medicine

## 2016-10-08 DIAGNOSIS — J45909 Unspecified asthma, uncomplicated: Secondary | ICD-10-CM | POA: Insufficient documentation

## 2016-10-08 DIAGNOSIS — G4700 Insomnia, unspecified: Secondary | ICD-10-CM | POA: Insufficient documentation

## 2016-10-08 DIAGNOSIS — Z87442 Personal history of urinary calculi: Secondary | ICD-10-CM | POA: Insufficient documentation

## 2016-10-08 DIAGNOSIS — G8929 Other chronic pain: Secondary | ICD-10-CM | POA: Insufficient documentation

## 2016-10-08 DIAGNOSIS — M2550 Pain in unspecified joint: Secondary | ICD-10-CM

## 2016-10-08 HISTORY — DX: Unspecified asthma, uncomplicated: J45.909

## 2016-10-08 SURGERY — ARTHROPLASTY, KNEE, TOTAL
Anesthesia: Spinal | Site: Knee | Laterality: Left

## 2016-10-08 NOTE — Assessment & Plan Note (Signed)
Continue Ambien Will monitor 

## 2016-10-08 NOTE — Assessment & Plan Note (Signed)
Albuterol refilled today Will monitor

## 2016-10-08 NOTE — Assessment & Plan Note (Signed)
Follows with pain management She understands I will not be filling any of the medications she is taking for pain

## 2016-10-08 NOTE — Assessment & Plan Note (Signed)
Currently not an issue Will monitor 

## 2016-10-08 NOTE — Assessment & Plan Note (Signed)
New onset Discussed diabetes and standards of medical care Discussed low carb, low fat diet and exercise for weight loss Discussed how often to check sugars and what the range should be Sent in meter, strips and lancets eRx for Glipizide 10 mg BID Foot exam today Flu shot UTD Pneumovax given today Encouraged yearly eye exams

## 2016-10-09 NOTE — Addendum Note (Signed)
Addended by: Roena MaladyEVONTENNO, Gearl Kimbrough Y on: 10/09/2016 11:13 AM   Modules accepted: Orders

## 2016-10-17 ENCOUNTER — Telehealth: Payer: Self-pay

## 2016-10-17 NOTE — Telephone Encounter (Signed)
Pt left v/m; established care on 10/04/16 and started on glipizide 10 mg; pt cannot see improvements in BS. Average BS is 179. Pt request cb with how to proceed. [facility] [address].

## 2016-10-17 NOTE — Telephone Encounter (Signed)
Are these blood sugars after meals, fasting, before bed?

## 2016-10-18 NOTE — Telephone Encounter (Signed)
Left detailed message on cell, as authorized in DPR, for pt to return call and provide [facility].

## 2016-10-23 ENCOUNTER — Inpatient Hospital Stay (INDEPENDENT_AMBULATORY_CARE_PROVIDER_SITE_OTHER): Payer: Self-pay | Admitting: Orthopedic Surgery

## 2016-10-25 MED ORDER — SITAGLIPTIN PHOSPHATE 50 MG PO TABS: 50.0000 mg | ORAL_TABLET | Freq: Every day | ORAL | 2 refills | Status: DC

## 2016-10-25 NOTE — Addendum Note (Signed)
Addended by: Roena MaladyEVONTENNO, Nuno Brubacher Y on: 10/25/2016 04:41 PM   Modules accepted: Orders

## 2016-10-25 NOTE — Telephone Encounter (Signed)
Pt reports she is checking her BG 1st thing in the AM.... The lowest 157 and as high as 200

## 2016-10-25 NOTE — Telephone Encounter (Signed)
Add Januvia 50 mg daily in addition to Glipizide. Send in #30, 2 refills

## 2016-10-25 NOTE — Telephone Encounter (Signed)
Left detailed msg on VM per HIPAA  

## 2016-10-25 NOTE — Telephone Encounter (Signed)
Pt is aware... [facility] [address]

## 2016-10-30 NOTE — Telephone Encounter (Addendum)
Pt left v/m; the med recently sent in was not covered by insurance; cost to pt was $ 120.00 and pt did not pick up med. Pt did not leave name of med;? Januvia ( cannot get pt on phone to verify name of med; left v/m for pt to cb.). Today pts FBS was 187. Pt wants different med.[facility]. Pamala Hurry Baity NP out of office this week.

## 2016-10-31 ENCOUNTER — Other Ambulatory Visit: Payer: Self-pay | Admitting: Family Medicine

## 2016-10-31 MED ORDER — METFORMIN HCL 500 MG PO TABS: 500.0000 mg | ORAL_TABLET | Freq: Two times a day (BID) | ORAL | 0 refills | Status: DC

## 2016-10-31 NOTE — Telephone Encounter (Signed)
Has she taken metformin previously? If not then start with 500 mg tabs, 1 tab a day with breakfast for 10 days. If tolerated increase to twice a day.  Have her update PCP with sugar in about 2 weeks, sooner if needed. Thanks.

## 2016-10-31 NOTE — Telephone Encounter (Signed)
No, has not taken Metformin previously.  Patient will begin Metformin as instructed.

## 2016-11-13 NOTE — Telephone Encounter (Signed)
[facility] Would she be willing to go to [facility] to get that one medication?

## 2016-11-13 NOTE — Telephone Encounter (Signed)
Pt left v/m; pt cannot afford metformin and request different med that is more affordable. Today FBS was 187. I called pt on cell and at work and was unable to speak with pt to let her know she needs to contact ins co to see what they approve for metformin that would be less expensive.

## 2016-11-14 NOTE — Telephone Encounter (Signed)
Left detailed msg on VM per HIPAA  

## 2016-12-04 ENCOUNTER — Other Ambulatory Visit: Payer: Self-pay | Admitting: Family Medicine

## 2017-01-03 ENCOUNTER — Ambulatory Visit: Payer: Self-pay | Admitting: Internal Medicine

## 2017-01-06 ENCOUNTER — Ambulatory Visit (INDEPENDENT_AMBULATORY_CARE_PROVIDER_SITE_OTHER): Payer: BLUE CROSS/BLUE SHIELD | Admitting: Internal Medicine

## 2017-01-06 ENCOUNTER — Encounter: Payer: Self-pay | Admitting: Internal Medicine

## 2017-01-06 VITALS — BP 108/76 | HR 63 | Temp 97.9°F | Wt 175.5 lb

## 2017-01-06 DIAGNOSIS — E119 Type 2 diabetes mellitus without complications: Secondary | ICD-10-CM

## 2017-01-06 NOTE — Assessment & Plan Note (Signed)
Will check A1C and microalbumin today Encouraged her to consume a low carb diet and exercise for weight loss Continue Metformin and Glipizide for now Foot exam today Pneumovax UTD Encouraged her to get an eye exam

## 2017-01-06 NOTE — Patient Instructions (Signed)

## 2017-01-06 NOTE — Progress Notes (Signed)
Subjective:    Patient ID: [name], female    DOB: 03-Dec-1955, 61 y.o.   MRN: [id number]  HPI  Pt presents to the clinic today for 3 month follow up of DM 2. Her last A1C was 10%, 09/2016. She was started on Glipizide 10 mg BID in addition to her Metformin 500 mg BID. Her fasting sugars range 120-200. She checks her feet daily. Her last eye exam was > 2 years ago. Pneumovax was given 09/2016.  Review of Systems      Past Medical History:  Diagnosis Date  . Arthritis   . Asthma   . History of GI bleed    2010--- UPPER ESOPHAGITIS/ DUODERAL ULCER/ EROSION  . History of hiatal hernia   . History of kidney stones   . History of shingles    2013  . Nephrolithiasis    BILATERAL  . Right ureteral stone   . Type 2 diabetes, diet controlled (HCC)   . Urgency of urination     Current Outpatient Prescriptions  Medication Sig Dispense Refill  . albuterol (PROVENTIL HFA;VENTOLIN HFA) 108 (90 BASE) MCG/ACT inhaler Inhale 2 puffs into the lungs every 4 (four) hours as needed for wheezing or shortness of breath. For shortness of breath     . albuterol (PROVENTIL) (2.5 MG/3ML) 0.083% nebulizer solution Take 2.5 mg by nebulization every 6 (six) hours as needed for wheezing or shortness of breath.    . baclofen (LIORESAL) 10 MG tablet Take 5 mg by mouth 3 (three) times daily as needed for muscle spasms.    Marland Kitchen gabapentin (NEURONTIN) 400 MG capsule Take 800 mg by mouth 4 (four) times daily.     Marland Kitchen glipiZIDE (GLUCOTROL) 10 MG tablet Take 1/2 tab BID x 2 weeks then increase to 1 tab BID 60 tablet 2  . meloxicam (MOBIC) 15 MG tablet Take 15 mg by mouth daily.    . metFORMIN (GLUCOPHAGE) 500 MG tablet TAKE 1 TABLET BY MOUTH TWICE DAILY WITH A MEAL 180 tablet 0  . morphine (MS CONTIN) 30 MG 12 hr tablet Take 30 mg by mouth 2 (two) times daily.    Marland Kitchen oxyCODONE-acetaminophen (PERCOCET) 10-325 MG tablet Take 1 tablet by mouth 4 (four) times daily as needed. For breakthrough pain.    Marland Kitchen tiZANidine  (ZANAFLEX) 2 MG tablet Take 2 mg by mouth 3 (three) times daily as needed for spasms.    Marland Kitchen zolpidem (AMBIEN) 5 MG tablet Take 5 mg by mouth at bedtime.     No current facility-administered medications for this visit.     No Known Allergies  Family History  Problem Relation Age of Onset  . Heart disease Father     Social History   Social History  . Marital status: Widowed    Spouse name: N/A  . Number of children: N/A  . Years of education: N/A   Occupational History  . Not on file.   Social History Main Topics  . Smoking status: Never Smoker  . Smokeless tobacco: Never Used  . Alcohol use No  . Drug use: No  . Sexual activity: Not on file   Other Topics Concern  . Not on file   Social History Narrative  . No narrative on file     Constitutional: Denies fever, malaise, fatigue, headache or abrupt weight changes.  Respiratory: Denies difficulty breathing, shortness of breath, cough or sputum production.   Cardiovascular: Denies chest pain, chest tightness, palpitations or swelling in the hands or  feet.  Gastrointestinal: Denies abdominal pain, bloating, constipation, diarrhea or blood in the stool.  Skin: Denies redness, rashes, lesions or ulcercations.    No other specific complaints in a complete review of systems (except as listed in HPI above).  Objective:   Physical Exam   BP 108/76   Pulse 63   Temp 97.9 F (36.6 C) (Oral)   Wt 175 lb 8 oz (79.6 kg)   SpO2 97%   BMI 31.09 kg/m  Wt Readings from Last 3 Encounters:  01/06/17 175 lb 8 oz (79.6 kg)  10/04/16 176 lb (79.8 kg)  09/26/16 176 lb 2.4 oz (79.9 kg)    General: Appears her stated age, obese in NAD. Skin: Warm, dry and intact. No ulcerations noted. Cardiovascular: Normal rate and rhythm.  Pulmonary/Chest: Normal effort and positive vesicular breath sounds. No respiratory distress. No wheezes, rales or ronchi noted.  Neurological: Alert and oriented. Sensation intact to BLE.    BMET      Component Value Date/Time   NA 136 09/26/2016 1035   K 4.3 09/26/2016 1035   CL 97 (L) 09/26/2016 1035   CO2 32 09/26/2016 1035   GLUCOSE 310 (H) 09/26/2016 1035   BUN 14 09/26/2016 1035   CREATININE 0.81 09/26/2016 1035   CALCIUM 9.8 09/26/2016 1035   GFRNONAA >60 09/26/2016 1035   GFRAA >60 09/26/2016 1035    Lipid Panel     Component Value Date/Time   CHOL 228 (H) 11/21/2010 1505   TRIG 543 (H) 11/21/2010 1505   HDL 36 (L) 11/21/2010 1505   CHOLHDL 6.3 11/21/2010 1505   VLDL UNABLE TO CALCULATE IF TRIGLYCERIDE OVER 400 mg/dL 40/98/119106/27/2012 [id number]   LDLCALC UNABLE TO CALCULATE IF TRIGLYCERIDE OVER 400 mg/dL 95/62/130806/27/2012 [id number]    CBC    Component Value Date/Time   WBC 7.0 09/26/2016 1035   RBC 4.39 09/26/2016 1035   HGB 12.8 09/26/2016 1035   HCT 40.8 09/26/2016 1035   PLT 220 09/26/2016 1035   MCV 92.9 09/26/2016 1035   MCH 29.2 09/26/2016 1035   MCHC 31.4 09/26/2016 1035   RDW 13.3 09/26/2016 1035   LYMPHSABS 1.7 09/04/2013 0921   MONOABS 0.3 09/04/2013 0921   EOSABS 0.0 09/04/2013 0921   BASOSABS 0.0 09/04/2013 0921    Hgb A1C Lab Results  Component Value Date   HGBA1C 10.0 (H) 09/26/2016           Assessment & Plan:

## 2017-01-07 LAB — MICROALBUMIN / CREATININE URINE RATIO
CREATININE, U: 251.4 mg/dL
MICROALB/CREAT RATIO: 104 mg/g — AB (ref 0.0–30.0)
Microalb, Ur: 261.4 mg/dL — ABNORMAL HIGH (ref 0.0–1.9)

## 2017-01-07 LAB — HEMOGLOBIN A1C: Hgb A1c MFr Bld: 7.5 % — ABNORMAL HIGH (ref 4.6–6.5)

## 2017-01-09 MED ORDER — GLIPIZIDE 10 MG PO TABS: 10.0000 mg | ORAL_TABLET | Freq: Two times a day (BID) | ORAL | 0 refills | Status: DC

## 2017-01-09 MED ORDER — METFORMIN HCL 1000 MG PO TABS: 1000.0000 mg | ORAL_TABLET | Freq: Two times a day (BID) | ORAL | 0 refills | Status: DC

## 2017-01-09 NOTE — Addendum Note (Signed)
Addended by: Roena MaladyEVONTENNO, Jahvier Aldea Y on: 01/09/2017 05:21 PM   Modules accepted: Orders

## 2017-01-16 ENCOUNTER — Telehealth: Payer: Self-pay

## 2017-01-16 NOTE — Telephone Encounter (Signed)
PLEASE NOTE: All timestamps contained within this report are represented as [place] Standard Time. CONFIDENTIALTY NOTICE: This fax transmission is intended only for the addressee. It contains information that is legally privileged, confidential or otherwise protected from use or disclosure. If you are not the intended recipient, you are strictly prohibited from reviewing, disclosing, copying using or disseminating any of this information or taking any action in reliance on or regarding this information. If you have received this fax in error, please notify us immediately by telephone so that we can arrange for its return to [place]. Phone: [phone], Toll-Free: [phone], Fax: [phone] Page: 1 of 2 Call Id: [id number] [facility] Day - Client TELEPHONE ADVICE RECORD [facility] Medical Call Center Patient Name: [name] Gender: Female DOB: 10-24-1955 Age: 61 Y 1 M 4 D Return Phone Number: [phone] (Primary) City/State/Zip: [place] [address] Client [facility] Day - Client Client Site  [facility] [place] - Day Physician Nicki Reaper - NP Who Is Calling Patient / Member / Family / Caregiver Call Type Triage / Clinical Relationship To Patient Self Return Phone Number [phone] (Primary) Chief Complaint Weakness, Generalized Reason for Call Symptomatic / Request for Health Information Initial Comment Caller states she she is diabetic and her sugar has been at 200, she is feeling tired, sleepy and weak. She says she is hurting on the right side of her abdomen. Appointment Disposition EMR Appointment Not Necessary Info pasted into Epic Yes Nurse Assessment Nurse: Fransisco Hertz, RN, Elnita Maxwell Date/Time Lamount Cohen Time): 01/16/2017 3:03:08 PM Confirm and document reason for call. If symptomatic, describe symptoms. ---Caller states she she is diabetic and her sugar has been at 191, she is feeling tired, sleepy and weak. She says  having pain on the right side of her abdomen. No fever. Does the PT have any chronic conditions? (i.e. diabetes, asthma, etc.) ---Yes List chronic conditions. ---diabetes, asthma Guidelines Guideline Title Affirmed Question Diabetes - High Blood Sugar Very weak (e.g., can't stand) Disp. Time Lamount Cohen Time) Disposition Final User 01/16/2017 3:09:33 PM Call EMS 911 Now Yes Fransisco Hertz, RN, Elnita Maxwell Referrals [facility] - ED Care Advice Given Per Guideline CALL EMS 911 NOW: Immediate medical attention is needed. You need to hang up and call 911 (or an ambulance). Probation officer Discretion: I'll call you back in a few minutes to be sure you were able to reach them.) CARE ADVICE given per Diabetes - High Blood Sugar (Adult) guideline. Comments User: Caryn Bee, RN Date/Time Lamount Cohen Time): 01/16/2017 3:16:18 PM PLEASE NOTE: All timestamps contained within this report are represented as [place] Standard Time. CONFIDENTIALTY NOTICE: This fax transmission is intended only for the addressee. It contains information that is legally privileged, confidential or otherwise protected from use or disclosure. If you are not the intended recipient, you are strictly prohibited from reviewing, disclosing, copying using or disseminating any of this information or taking any action in reliance on or regarding this information. If you have received this fax in error, please notify us immediately by telephone so that we can arrange for its return to [place]. Phone: [phone], Toll-Free: [phone], Fax: [phone] Page: 2 of 2 Call Id: [id number] Comments Caller states she is too weak to stand.

## 2017-01-16 NOTE — Telephone Encounter (Signed)
Home phone remaining busy; no answer no cell and [facility]my ER contact the call cannot be completed. Tried multiple times and home # remains busy and no one answers cell.

## 2017-01-17 NOTE — Telephone Encounter (Signed)
Does she have a family contact or next of kin we can call. If not, we should call 911 to go check on her.

## 2017-01-17 NOTE — Telephone Encounter (Signed)
I tried all contact # again with no success and I called 911 and someone will go out and ck on pt and EMS will call [facility] with update. FYI to Pamala Hurry NP.

## 2017-01-17 NOTE — Telephone Encounter (Signed)
The [employer] called back and is at pts residence; the car is there;a dog is barking inside the house but no one will come to the door. Pamala Hurry NP said if cannot get in contact with anyone need to break the door down. I got a sister in laws # from Tammy the daughter chart and Lars Pinks the sister in law gave me Tammy's cell # and told me [place]. I spoke with Tammy and pt was not available then had taken her grandchild to the restroom but Tammy said pt was OK, her BS has been running high for awhile but pt not having any problem walking. Tammy will have pt cb to [facility] this afternoon. I apologized to the Deputy and thanked him for cking on pt but the pt was OK. FYI to Pamala Hurry NP.

## 2017-01-17 NOTE — Telephone Encounter (Addendum)
Home phone still remaining busy, no answer on cell and pt is not at work. FYI to Pamala Hurry NP.

## 2017-01-28 ENCOUNTER — Other Ambulatory Visit: Payer: Self-pay

## 2017-01-28 MED ORDER — ALBUTEROL SULFATE HFA 108 (90 BASE) MCG/ACT IN AERS: 2.0000 | INHALATION_SPRAY | RESPIRATORY_TRACT | 0 refills | Status: DC | PRN

## 2017-01-28 NOTE — Telephone Encounter (Signed)
Pt is not sick but wants to keep on hand the albuterol inhaler; at 10/04/16 visit was supposed to be refilled but did not get sent in . Refill done as instructed. [facility].

## 2017-03-17 ENCOUNTER — Telehealth: Payer: Self-pay | Admitting: Internal Medicine

## 2017-03-17 NOTE — Telephone Encounter (Signed)
Copied from CRM #[id number]. Topic: Quick Communication - See Telephone Encounter >> Mar 17, 2017 11:28 AM Landry MellowFoltz, Melissa J wrote: CRM for notification. See Telephone encounter for:  03/17/17. Pt want to know if she has had flu shot or pneumonia shot for this year. They  are giving free shots at her employer  Call back # [phone]

## 2017-03-17 NOTE — Telephone Encounter (Signed)
Left detailed msg on VM per HIPAA Pt has Pneumo 09/2016 but no flu on file

## 2017-04-11 ENCOUNTER — Other Ambulatory Visit: Payer: Self-pay

## 2017-05-28 ENCOUNTER — Ambulatory Visit: Payer: Self-pay | Admitting: *Deleted

## 2017-05-28 NOTE — Telephone Encounter (Signed)
Noted  

## 2017-05-28 NOTE — Telephone Encounter (Signed)
Patient states she has had pain that has not stopped for 1 week now- she suggests that she might have kidney stones due to her history of them. She states she has stared seeing blood in her urine for 2 days intermittently. No fever reported. Patient is at work and will not leave or miss work- appointment made in am. Reason for Disposition . [1] MILD pain (e.g., does not interfere with normal activities) AND [2] pain comes and goes (cramps) AND [3] present > 48 hours  Answer Assessment - Initial Assessment Questions 1. LOCATION: "Where does it hurt?"      Left lower abdomen/side 2. RADIATION: "Does the pain shoot anywhere else?" (e.g., chest, back)     Radiates to the back 3. ONSET: "When did the pain begin?" (e.g., minutes, hours or days ago)      1 week ago- patient thought she might have lifted something 4. SUDDEN: "Gradual or sudden onset?"     gradual 5. PATTERN "Does the pain come and go, or is it constant?"    - If constant: "Is it getting better, staying the same, or worsening?"      (Note: Constant means the pain never goes away completely; most serious pain is constant and it progresses)     - If intermittent: "How long does it last?" "Do you have pain now?"     (Note: Intermittent means the pain goes away completely between bouts)     Constant- does ease some when she sits 6. SEVERITY: "How bad is the pain?"  (e.g., Scale 1-10; mild, moderate, or severe)   - MILD (1-3): doesn't interfere with normal activities, abdomen soft and not tender to touch    - MODERATE (4-7): interferes with normal activities or awakens from sleep, tender to touch    - SEVERE (8-10): excruciating pain, doubled over, unable to do any normal activities      6-7 7. RECURRENT SYMPTOM: "Have you ever had this type of abdominal pain before?" If so, ask: "When was the last time?" and "What happened that time?"      no 8. CAUSE: "What do you think is causing the abdominal pain?"     Possible kidney problem-  seeing blood in urine- 2 days 9. RELIEVING/AGGRAVATING FACTORS: "What makes it better or worse?" (e.g., movement, antacids, bowel movement)     Sitting/laying makes it better 10. OTHER SYMPTOMS: "Has there been any vomiting, diarrhea, constipation, or urine problems?"       no 11. PREGNANCY: "Is there any chance you are pregnant?" "When was your last menstrual period?"       n/a  Protocols used: [facility]

## 2017-05-29 ENCOUNTER — Ambulatory Visit (INDEPENDENT_AMBULATORY_CARE_PROVIDER_SITE_OTHER): Payer: BLUE CROSS/BLUE SHIELD | Admitting: Primary Care

## 2017-05-29 ENCOUNTER — Encounter: Payer: Self-pay | Admitting: Primary Care

## 2017-05-29 ENCOUNTER — Ambulatory Visit (INDEPENDENT_AMBULATORY_CARE_PROVIDER_SITE_OTHER)
Admission: RE | Admit: 2017-05-29 | Discharge: 2017-05-29 | Disposition: A | Discharge status: Home / Self Care | Payer: BLUE CROSS/BLUE SHIELD | Source: Ambulatory Visit | Attending: Primary Care | Admitting: Primary Care

## 2017-05-29 ENCOUNTER — Telehealth: Payer: Self-pay

## 2017-05-29 VITALS — BP 126/70 | HR 74 | Temp 97.8°F | Ht 63.0 in | Wt 183.8 lb

## 2017-05-29 DIAGNOSIS — M549 Dorsalgia, unspecified: Secondary | ICD-10-CM

## 2017-05-29 DIAGNOSIS — R319 Hematuria, unspecified: Secondary | ICD-10-CM | POA: Diagnosis not present

## 2017-05-29 DIAGNOSIS — N200 Calculus of kidney: Secondary | ICD-10-CM

## 2017-05-29 LAB — POC URINALSYSI DIPSTICK (AUTOMATED)
Bilirubin, UA: NEGATIVE
GLUCOSE UA: NEGATIVE
KETONES UA: NEGATIVE
Nitrite, UA: NEGATIVE
Spec Grav, UA: >=1.03 — AB (ref 1.010–1.025)
Urobilinogen, UA: 0.2 E.U./dL
pH, UA: 6 (ref 5.0–8.0)

## 2017-05-29 MED ORDER — TAMSULOSIN HCL 0.4 MG PO CAPS: 0.4000 mg | ORAL_CAPSULE | Freq: Every day | ORAL | 0 refills | Status: DC

## 2017-05-29 NOTE — Progress Notes (Signed)
Subjective:    Patient ID: [name], female    DOB: 01-04-1956, 62 y.o.   MRN: [id number]  HPI  [name] is a 62 year old female with a history of kidney stones, type 2 diabetes, chronic pain managed on Percocet and MS Contin who presents today with a chief complaint of hematuria.   She also reports left lower back pain with radiation around to her left lateral side and difficulty urinating. She can urinate in the morning but has difficulty throughout the day. She denies urinary frequency, vaginal discharge/itching, fevers, abdominal pain, nausea. Her symptoms began one week ago. She's not taken anything OTC for her symptoms.   Review of Systems  Constitutional: Negative for fever.  Gastrointestinal: Negative for abdominal pain.  Genitourinary: Positive for flank pain and hematuria. Negative for dysuria, frequency, pelvic pain and vaginal discharge.       Past Medical History:  Diagnosis Date  . Arthritis   . Asthma   . History of GI bleed    2010--- UPPER ESOPHAGITIS/ DUODERAL ULCER/ EROSION  . History of hiatal hernia   . History of kidney stones   . History of shingles    2013  . Nephrolithiasis    BILATERAL  . Right ureteral stone   . Type 2 diabetes, diet controlled (HCC)   . Urgency of urination      Social History   Socioeconomic History  . Marital status: Widowed    Spouse name: Not on file  . Number of children: Not on file  . Years of education: Not on file  . Highest education level: Not on file  Social Needs  . Financial resource strain: Not on file  . Food insecurity - worry: Not on file  . Food insecurity - inability: Not on file  . Transportation needs - medical: Not on file  . Transportation needs - non-medical: Not on file  Occupational History  . Not on file  Tobacco Use  . Smoking status: Never Smoker  . Smokeless tobacco: Never Used  Substance and Sexual Activity  . Alcohol use: No  . Drug use: No  . Sexual activity: Not on file    Other Topics Concern  . Not on file  Social History Narrative  . Not on file    Past Surgical History:  Procedure Laterality Date  . CARDIAC CATHETERIZATION  02-10-2009  DR BENSIMHON   NORMAL CORONARIES/  LOW NORMAL LVF WITHOUT WALL MOTION ABNORMALITIES  . CARDIAC CATHETERIZATION  11-21-2010  DR Jacinto HalimGANJI   NORMAL CORONARIES/  EF 50-55%  . CHOLECYSTECTOMY    . CYSTOSCOPY W/ URETERAL STENT PLACEMENT Right 09/04/2013   Procedure: CYSTOSCOPY WITH RETROGRADE PYELOGRAM/URETERAL STENT PLACEMENT;  Surgeon: Milford Cageaniel Young Woodruff, MD;  Location: [facility];  Service: Urology;  Laterality: Right;  . CYSTOSCOPY WITH RETROGRADE PYELOGRAM, URETEROSCOPY AND STENT PLACEMENT Right 09/27/2013   Procedure: CYSTOSCOPY WITH RETROGRADE PYELOGRAM, URETEROSCOPY AND STENT EXCHANGE;  Surgeon: Milford Cageaniel Young Woodruff, MD;  [facility] [place];  Service: Urology;  Laterality: Right;  . HOLMIUM LASER APPLICATION Right 09/27/2013   Procedure: HOLMIUM LASER APPLICATION;  Surgeon: Milford Cageaniel Young Woodruff, MD;  [facility] [place];  Service: Urology;  Laterality: Right;  . HYSTEROSCOPY W/D&C  01-25-2003  . LAPAROSCOPIC ASSISTED VAGINAL HYSTERECTOMY  03-02-2003   W/  BILATERAL SALPINGOOPHORECTOMY  . LAPAROSCOPY  EXTENSIVE LYSIS ADHESIONS/  REDO PARAESOPHAGEAL HIATAL HERNIA WITH PRIMARY CLOSURE AND MESH/ NISSEN FUNDOPLICATION (1cm)/  REPAIR GASTROTOMY  08-22-2009  . NISSEN FUNDOPLICATION  2000   W/  CHOLECYSTECTOMY  . SHOULDER ARTHROSCOPY WITH SUBACROMIAL DECOMPRESSION, ROTATOR CUFF REPAIR AND BICEP TENDON REPAIR Left 08-02-2010   AND LABRAL DEBRIDEMENT  . TOTAL KNEE ARTHROPLASTY Right 07-01-2006  . TOTAL KNEE REVISION  02/25/2012   Procedure: TOTAL KNEE REVISION;  Surgeon: Cammy Copa, MD;  Location: [facility];  Service: Orthopedics;  Laterality: Right;  Revise right total knee arthroplasty  . TUBAL LIGATION  1990  . WRIST FUSION Left 2004   RETAINED HARDWARE    Family History  Problem  Relation Age of Onset  . Heart disease Father     No Known Allergies  Current Outpatient Medications on File Prior to Visit  Medication Sig Dispense Refill  . albuterol (PROVENTIL HFA;VENTOLIN HFA) 108 (90 Base) MCG/ACT inhaler Inhale 2 puffs into the lungs every 4 (four) hours as needed for wheezing or shortness of breath. For shortness of breath 18 g 0  . albuterol (PROVENTIL) (2.5 MG/3ML) 0.083% nebulizer solution Take 2.5 mg by nebulization every 6 (six) hours as needed for wheezing or shortness of breath.    . baclofen (LIORESAL) 10 MG tablet Take 5 mg by mouth 3 (three) times daily as needed for muscle spasms.    Marland Kitchen gabapentin (NEURONTIN) 400 MG capsule Take 800 mg by mouth 4 (four) times daily.     Marland Kitchen glipiZIDE (GLUCOTROL) 10 MG tablet Take 1 tablet (10 mg total) by mouth 2 (two) times daily before a meal. 180 tablet 0  . meloxicam (MOBIC) 15 MG tablet Take 15 mg by mouth daily.    . metFORMIN (GLUCOPHAGE) 1000 MG tablet Take 1 tablet (1,000 mg total) by mouth 2 (two) times daily with a meal. 180 tablet 0  . morphine (MS CONTIN) 30 MG 12 hr tablet Take 30 mg by mouth 2 (two) times daily.    Marland Kitchen oxyCODONE-acetaminophen (PERCOCET) 10-325 MG tablet Take 1 tablet by mouth 4 (four) times daily as needed. For breakthrough pain.    Marland Kitchen tiZANidine (ZANAFLEX) 2 MG tablet Take 2 mg by mouth 3 (three) times daily as needed for spasms.    Marland Kitchen zolpidem (AMBIEN) 5 MG tablet Take 5 mg by mouth at bedtime.     No current facility-administered medications on file prior to visit.     BP 126/70   Pulse 74   Temp 97.8 F (36.6 C) (Oral)   Ht 5\' 3"  (1.6 m)   Wt 183 lb 12.8 oz (83.4 kg)   SpO2 93%   BMI 32.56 kg/m    Objective:   Physical Exam  Constitutional: She appears well-nourished.  Neck: Neck supple.  Cardiovascular: Normal rate and regular rhythm.  Pulmonary/Chest: Effort normal and breath sounds normal.  Abdominal: Soft. Bowel sounds are normal. There is no tenderness. There is CVA  tenderness.  Left CVA tenderness, left lower back pain  Musculoskeletal:       Back:  Skin: Skin is warm and dry.          Assessment & Plan:  Hematuria:  Also with flank pain x 1 week. History of renal stones. Exam today consistent for stone. Check KUB films today given CVA tenderness. Rx for Flomax sent to pharmacy. She will take her normal analgesics from pain management.  UA: 1+ leuks, negative nitriates, 3+ blood. Culture sent, will await results. Fluids, rest, return/ED precautions provided.  Morrie Sheldon, NP

## 2017-05-29 NOTE — Telephone Encounter (Signed)
Per DPR, left detail message of [name]'s comments for patient.  Work note? Excuse for today only?

## 2017-05-29 NOTE — Patient Instructions (Signed)
Complete xray(s) prior to leaving today. I will notify you of your results once received.  Start tamsulosin 0.4 mg capsules to help with urine flow and to release potential kidney stone.  Ensure you are staying hydrated with water.  Please notify me immediately if you develop fevers, chills, nausea. Please also notify us if no improvement in symptoms in 24-48 hours.  It was a pleasure meeting you!

## 2017-05-29 NOTE — Telephone Encounter (Signed)
Excuse for today and tomorrow.  Thanks.

## 2017-05-29 NOTE — Telephone Encounter (Signed)
Copied from CRM [id number]. Topic: Quick Communication - Lab Results >> May 29, 2017 12:46 PM Oneal GroutSebastian, Jennifer S wrote: Requesting lab results, also requesting work note. Please advise

## 2017-05-29 NOTE — Addendum Note (Signed)
Addended by: Tawnya CrookSAMBATH, Any Mcneice on: 05/29/2017 07:51 AM   Modules accepted: Orders

## 2017-05-30 LAB — URINE CULTURE
MICRO NUMBER:: 90009478
Result:: NO GROWTH
SPECIMEN QUALITY: ADEQUATE

## 2017-05-30 NOTE — Telephone Encounter (Signed)
Per DPR, left detail message that urine culture have not come back yet and work note left in the front office.  Also left detail message regarding X-ray as well.

## 2017-05-30 NOTE — Telephone Encounter (Signed)
Patient calling again, please call back at [phone]

## 2017-06-02 ENCOUNTER — Other Ambulatory Visit: Payer: Self-pay | Admitting: Internal Medicine

## 2017-06-02 NOTE — Telephone Encounter (Signed)
Well, she was supposed to return in November to have her A1C repeated. I need her to do that so I know when to see her again, but I suppose she can make an appt for her annual exam any time.

## 2017-06-02 NOTE — Telephone Encounter (Signed)
Urine culture is negative. She is managed via pain management so she will need to discuss her pain management with them. Has she passed the stone? If not then we need to get a CT scan.

## 2017-06-02 NOTE — Telephone Encounter (Signed)
Lab only appt req mailed to pt

## 2017-06-02 NOTE — Telephone Encounter (Signed)
Per DPR, left detail message of [name]'s comments regarding kidney stone and urine culture is not back yet , also for patient to call back.  Send this to [name] since it is mention that patient is in pain.

## 2017-06-02 NOTE — Telephone Encounter (Addendum)
Pt requesting results of labs. Please call asap.  Pt in a lot of pain.  Please call her at work and leave a message. [phone]

## 2017-06-02 NOTE — Telephone Encounter (Signed)
When would you like for this pt to RTC for OV or CPE? Please advise

## 2017-06-03 ENCOUNTER — Ambulatory Visit: Payer: Self-pay

## 2017-06-03 NOTE — Telephone Encounter (Signed)
Patient called in with c/o "severe left flank pain from kidney stone." She says she was seen in the office for the pain and was told she has a kidney stone. She says "the stone doesn't feel like it's moving, it hurts a 10 on the pain scale." I asked is she taking pain medicine, she says "yes and it's not helping." She still reports blood in the urine and weakness, but is able to urinate without any problems.  According to protocol, go the the ED, care advice given, patient verbalized understanding and says there is someone at home who can drive her to the ED.   Reason for Disposition . [1] SEVERE pain (e.g., excruciating, scale 8-10) AND [2] not improved after pain medicine  Answer Assessment - Initial Assessment Questions 1. LOCATION: "Where does it hurt?" (e.g., left, right)     Left side 2. ONSET: "When did the pain start?"     1 week or more 3. SEVERITY: "How bad is the pain?" (e.g., Scale 1-10; mild, moderate, or severe)   - MILD (1-3): doesn't interfere with normal activities    - MODERATE (4-7): interferes with normal activities or awakens from sleep    - SEVERE (8-10): excruciating pain and patient unable to do normal activities (stays in bed)       10 4. PATTERN: "Does the pain come and go, or is it constant?"      Constant 5. CAUSE: "What do you think is causing the pain?"     Kidney stone 6. OTHER SYMPTOMS:  "Do you have any other symptoms?" (e.g., fever, abdominal pain, vomiting, leg weakness, burning with urination, blood in urine)     Blood in urine, weakness 7. PREGNANCY:  "Is there any chance you are pregnant?" "When was your last menstrual period?"     No  Protocols used: FLANK PAIN-A-AH

## 2017-06-03 NOTE — Telephone Encounter (Signed)
Attempted to contact pt; line busy 

## 2017-06-04 ENCOUNTER — Telehealth: Payer: Self-pay | Admitting: Internal Medicine

## 2017-06-04 NOTE — Telephone Encounter (Signed)
Noted, CT renal stone study ordered. FYI to PCP.

## 2017-06-04 NOTE — Telephone Encounter (Signed)
I did not call pt and I do not see where anyone has either.Marland Kitchen..Marland Kitchen

## 2017-06-04 NOTE — Telephone Encounter (Signed)
Left message on voicemail for patient to call back. 

## 2017-06-04 NOTE — Telephone Encounter (Signed)
Pt returning call from office; no telephone encounter found. Routed to office

## 2017-06-04 NOTE — Telephone Encounter (Signed)
Pt notified of notes per Katherine,NP on 06/02/16. Pt verbalized understanding.Pt states that she has not passed the kidney stone at this time.

## 2017-06-04 NOTE — Addendum Note (Signed)
Addended by: Doreene NestLARK, Chay Mazzoni K on: 06/04/2017 06:46 PM   Modules accepted: Orders

## 2017-06-05 ENCOUNTER — Telehealth: Payer: Self-pay

## 2017-06-05 ENCOUNTER — Ambulatory Visit (INDEPENDENT_AMBULATORY_CARE_PROVIDER_SITE_OTHER)
Admission: RE | Admit: 2017-06-05 | Discharge: 2017-06-05 | Disposition: A | Discharge status: Home / Self Care | Payer: BLUE CROSS/BLUE SHIELD | Source: Ambulatory Visit | Attending: Primary Care | Admitting: Primary Care

## 2017-06-05 DIAGNOSIS — N200 Calculus of kidney: Secondary | ICD-10-CM

## 2017-06-05 NOTE — Telephone Encounter (Signed)
Copied from CRM [id number]. Topic: Referral - Status >> Jun 05, 2017 10:44 AM Oneal GroutSebastian, Jennifer S wrote: Reason for CRM: checking status of CT scan

## 2017-06-05 NOTE — Telephone Encounter (Signed)
[facility] and patient aware.

## 2017-06-05 NOTE — Telephone Encounter (Signed)
Appears referral placed on 06/04/17; pt has 3 mm kidney stone, left kidney.

## 2017-06-06 ENCOUNTER — Other Ambulatory Visit: Payer: Self-pay | Admitting: Primary Care

## 2017-06-06 DIAGNOSIS — N200 Calculus of kidney: Secondary | ICD-10-CM

## 2017-06-06 MED ORDER — CEPHALEXIN 500 MG PO CAPS: 500.0000 mg | ORAL_CAPSULE | Freq: Two times a day (BID) | ORAL | 0 refills | Status: AC

## 2017-06-12 ENCOUNTER — Other Ambulatory Visit: Payer: Self-pay | Admitting: Urology

## 2017-06-12 DIAGNOSIS — N200 Calculus of kidney: Secondary | ICD-10-CM

## 2017-06-18 ENCOUNTER — Telehealth (HOSPITAL_COMMUNITY): Payer: Self-pay | Admitting: *Deleted

## 2017-06-18 ENCOUNTER — Encounter: Payer: Self-pay | Admitting: Cardiology

## 2017-06-18 ENCOUNTER — Ambulatory Visit (INDEPENDENT_AMBULATORY_CARE_PROVIDER_SITE_OTHER): Payer: BLUE CROSS/BLUE SHIELD | Admitting: Cardiology

## 2017-06-18 VITALS — BP 110/72 | HR 79 | Ht 63.0 in | Wt 177.8 lb

## 2017-06-18 DIAGNOSIS — R9431 Abnormal electrocardiogram [ECG] [EKG]: Secondary | ICD-10-CM | POA: Diagnosis not present

## 2017-06-18 DIAGNOSIS — E119 Type 2 diabetes mellitus without complications: Secondary | ICD-10-CM | POA: Diagnosis not present

## 2017-06-18 DIAGNOSIS — I252 Old myocardial infarction: Secondary | ICD-10-CM

## 2017-06-18 DIAGNOSIS — J4520 Mild intermittent asthma, uncomplicated: Secondary | ICD-10-CM | POA: Diagnosis not present

## 2017-06-18 DIAGNOSIS — Z01810 Encounter for preprocedural cardiovascular examination: Secondary | ICD-10-CM | POA: Diagnosis not present

## 2017-06-18 NOTE — Telephone Encounter (Signed)
Patient given detailed instructions per Myocardial Perfusion Study Information Sheet for the test on 06/20/17  Patient notified to arrive 15 minutes early and that it is imperative to arrive on time for appointment to keep from having the test rescheduled.  If you need to cancel or reschedule your appointment, please call the office within 24 hours of your appointment. . Patient verbalized understanding. [name]    

## 2017-06-18 NOTE — Patient Instructions (Signed)
Medication Instructions:  Your physician recommends that you continue on your current medications as directed. Please refer to the Current Medication list given to you today.   Labwork: None  Testing/Procedures: Your physician has requested that you have a lexiscan myoview. For further information please visit [email or web address]. Please follow instruction sheet, as given.    Follow-Up: Your physician recommends that you schedule a follow-up appointment in: 1 month   Any Other Special Instructions Will Be Listed Below (If Applicable).     If you need a refill on your cardiac medications before your next appointment, please call your pharmacy.   

## 2017-06-18 NOTE — Progress Notes (Signed)
Cardiology Office Note:    Date:  06/18/2017   ID:  [name], DOB March 30, 1956, MRN [id number]  PCP:  Lorre Munroe, NP  Cardiologist:  Garwin Brothers, MD   Referring MD: Lorre Munroe, NP    ASSESSMENT:    1. Abnormal EKG   2. Preop cardiovascular exam   3. Diabetes mellitus type 2, noninsulin dependent (HCC)   4. Mild intermittent asthma without complication   5. History of myocardial infarction    PLAN:    In order of problems listed above:  1. Secondary prevention stressed with the patient.  Importance of compliance with diet and medications stressed and she vocalized understanding. 2. In view of multiple risk factors for coronary artery disease she will undergo Lexiscan sestamibi.  If this is negative then she has not at high risk for coronary events during the aforementioned surgery.  Meticulous hemodynamic monitoring will further reduce the risk of coronary events. 3. She needs to be on medication such as aspirin, ACE inhibition and statin therapy in view of diabetes mellitus.  Discussed this with her at length and this will be managed by her primary care physician.   Medication Adjustments/Labs and Tests Ordered: Current medicines are reviewed at length with the patient today.  Concerns regarding medicines are outlined above.  Orders Placed This Encounter  Procedures  . Myocardial Perfusion Imaging  . EKG 12-Lead   No orders of the defined types were placed in this encounter.    History of Present Illness:    [name] is a 62 y.o. female who is being seen today for the evaluation of abnormal EKG and preop cardiovascular risk stratification at the request of Lorre Munroe, NP.  Patient is a pleasant 62 year old female.  She has past medical history of myocardial infarction about which much details are unknown she says that this happened about 10 years ago and cannot give me any details.  She has history of diabetes mellitus and bronchial asthma.  No  history of hypertension or dyslipidemia.  She is here because she needs to go for a urological procedure and was sent here for preop risk stratification.  She works full-time but leads a sedentary lifestyle because of her orthopedic issues involving her knee.  At the time of my evaluation, the patient is alert awake oriented and in no distress.  No shortness of breath on exertion or any such symptoms.  Past Medical History:  Diagnosis Date  . Arthritis   . Asthma   . History of GI bleed    2010--- UPPER ESOPHAGITIS/ DUODERAL ULCER/ EROSION  . History of hiatal hernia   . History of kidney stones   . History of shingles    2013  . Nephrolithiasis    BILATERAL  . Right ureteral stone   . Type 2 diabetes, diet controlled (HCC)   . Urgency of urination     Past Surgical History:  Procedure Laterality Date  . CARDIAC CATHETERIZATION  02-10-2009  DR BENSIMHON   NORMAL CORONARIES/  LOW NORMAL LVF WITHOUT WALL MOTION ABNORMALITIES  . CARDIAC CATHETERIZATION  11-21-2010  DR Jacinto Halim   NORMAL CORONARIES/  EF 50-55%  . CHOLECYSTECTOMY    . CYSTOSCOPY W/ URETERAL STENT PLACEMENT Right 09/04/2013   Procedure: CYSTOSCOPY WITH RETROGRADE PYELOGRAM/URETERAL STENT PLACEMENT;  Surgeon: Milford Cage, MD;  Location: [facility];  Service: Urology;  Laterality: Right;  . CYSTOSCOPY WITH RETROGRADE PYELOGRAM, URETEROSCOPY AND STENT PLACEMENT Right 09/27/2013   Procedure: CYSTOSCOPY  WITH RETROGRADE PYELOGRAM, URETEROSCOPY AND STENT EXCHANGE;  Surgeon: Milford Cage, MD;  Location: [facility];  Service: Urology;  Laterality: Right;  . HOLMIUM LASER APPLICATION Right 09/27/2013   Procedure: HOLMIUM LASER APPLICATION;  Surgeon: Milford Cage, MD;  Location: [facility];  Service: Urology;  Laterality: Right;  . HYSTEROSCOPY W/D&C  01-25-2003  . LAPAROSCOPIC ASSISTED VAGINAL HYSTERECTOMY  03-02-2003   W/  BILATERAL SALPINGOOPHORECTOMY  . LAPAROSCOPY  EXTENSIVE LYSIS  ADHESIONS/  REDO PARAESOPHAGEAL HIATAL HERNIA WITH PRIMARY CLOSURE AND MESH/ NISSEN FUNDOPLICATION (1cm)/  REPAIR GASTROTOMY  08-22-2009  . NISSEN FUNDOPLICATION  2000   W/  CHOLECYSTECTOMY  . SHOULDER ARTHROSCOPY WITH SUBACROMIAL DECOMPRESSION, ROTATOR CUFF REPAIR AND BICEP TENDON REPAIR Left 08-02-2010   AND LABRAL DEBRIDEMENT  . TOTAL KNEE ARTHROPLASTY Right 07-01-2006  . TOTAL KNEE REVISION  02/25/2012   Procedure: TOTAL KNEE REVISION;  Surgeon: Cammy Copa, MD;  Location: [facility];  Service: Orthopedics;  Laterality: Right;  Revise right total knee arthroplasty  . TUBAL LIGATION  1990  . WRIST FUSION Left 2004   RETAINED HARDWARE    Current Medications: Current Meds  Medication Sig  . albuterol (PROVENTIL HFA;VENTOLIN HFA) 108 (90 Base) MCG/ACT inhaler Inhale 2 puffs into the lungs every 4 (four) hours as needed for wheezing or shortness of breath. For shortness of breath  . albuterol (PROVENTIL) (2.5 MG/3ML) 0.083% nebulizer solution Take 2.5 mg by nebulization every 6 (six) hours as needed for wheezing or shortness of breath.  . baclofen (LIORESAL) 10 MG tablet Take 5 mg by mouth 3 (three) times daily as needed for muscle spasms.  Marland Kitchen gabapentin (NEURONTIN) 400 MG capsule Take 800 mg by mouth 4 (four) times daily.   Marland Kitchen glipiZIDE (GLUCOTROL) 10 MG tablet TAKE 1 TABLET BY MOUTH TWICE DAILY BEFORE MEAL(S)  . meloxicam (MOBIC) 15 MG tablet Take 15 mg by mouth daily.  . metFORMIN (GLUCOPHAGE) 1000 MG tablet TAKE 1 TABLET BY MOUTH TWICE DAILY WITH A MEAL  . morphine (MS CONTIN) 30 MG 12 hr tablet Take 30 mg by mouth 2 (two) times daily.  Marland Kitchen oxyCODONE-acetaminophen (PERCOCET) 10-325 MG tablet Take 1 tablet by mouth 4 (four) times daily as needed. For breakthrough pain.  . tamsulosin (FLOMAX) 0.4 MG CAPS capsule Take 1 capsule (0.4 mg total) by mouth daily.  Marland Kitchen tiZANidine (ZANAFLEX) 2 MG tablet Take 2 mg by mouth 3 (three) times daily as needed for spasms.  Marland Kitchen zolpidem (AMBIEN) 5 MG tablet  Take 5 mg by mouth at bedtime.     Allergies:   Patient has no known allergies.   Social History   Socioeconomic History  . Marital status: Widowed    Spouse name: None  . Number of children: None  . Years of education: None  . Highest education level: None  Social Needs  . Financial resource strain: None  . Food insecurity - worry: None  . Food insecurity - inability: None  . Transportation needs - medical: None  . Transportation needs - non-medical: None  Occupational History  . None  Tobacco Use  . Smoking status: Never Smoker  . Smokeless tobacco: Never Used  Substance and Sexual Activity  . Alcohol use: No  . Drug use: No  . Sexual activity: None  Other Topics Concern  . None  Social History Narrative  . None     Family History: The patient's family history includes Heart disease in her father.  ROS:   Please see  the history of present illness.    All other systems reviewed and are negative.  EKGs/Labs/Other Studies Reviewed:    The following studies were reviewed today: EKG reveals sinus rhythm with ventricular bigeminy and nonspecific ST-T changes.  I reviewed records from referring office.   Recent Labs: 09/26/2016: BUN 14; Creatinine, Ser 0.81; Hemoglobin 12.8; Platelets 220; Potassium 4.3; Sodium 136  Recent Lipid Panel    Component Value Date/Time   CHOL 228 (H) 11/21/2010 1505   TRIG 543 (H) 11/21/2010 1505   HDL 36 (L) 11/21/2010 1505   CHOLHDL 6.3 11/21/2010 1505   VLDL UNABLE TO CALCULATE IF TRIGLYCERIDE OVER 400 mg/dL 16/02/9603 5409   LDLCALC UNABLE TO CALCULATE IF TRIGLYCERIDE OVER 400 mg/dL 81/19/1478 2956    Physical Exam:    VS:  BP 110/72 (BP Location: Left Arm, Patient Position: Sitting, Cuff Size: Normal)   Pulse 79   Ht 5\' 3"  (1.6 m)   Wt 177 lb 12.8 oz (80.6 kg)   SpO2 97%   BMI 31.50 kg/m     Wt Readings from Last 3 Encounters:  06/18/17 177 lb 12.8 oz (80.6 kg)  05/29/17 183 lb 12.8 oz (83.4 kg)  01/06/17 175 lb 8 oz  (79.6 kg)     GEN: Patient is in no acute distress HEENT: Normal NECK: No JVD; No carotid bruits LYMPHATICS: No lymphadenopathy CARDIAC: S1 S2 regular, 2/6 systolic murmur at the apex. RESPIRATORY:  Clear to auscultation without rales, wheezing or rhonchi  ABDOMEN: Soft, non-tender, non-distended MUSCULOSKELETAL:  No edema; No deformity  SKIN: Warm and dry NEUROLOGIC:  Alert and oriented x 3 PSYCHIATRIC:  Normal affect    Signed, Garwin Brothers, MD  06/18/2017 11:29 AM    [facility]

## 2017-06-20 ENCOUNTER — Ambulatory Visit (HOSPITAL_COMMUNITY): Payer: BLUE CROSS/BLUE SHIELD | Attending: Internal Medicine

## 2017-06-20 DIAGNOSIS — R9431 Abnormal electrocardiogram [ECG] [EKG]: Secondary | ICD-10-CM

## 2017-06-20 DIAGNOSIS — Z01810 Encounter for preprocedural cardiovascular examination: Secondary | ICD-10-CM

## 2017-06-20 LAB — MYOCARDIAL PERFUSION IMAGING
CHL CUP NUCLEAR SSS: 3
CHL CUP RESTING HR STRESS: 73 {beats}/min
CSEPPHR: 96 {beats}/min
NUC STRESS TID: 0.9
RATE: 0.3
SDS: 1
SRS: 2

## 2017-06-20 MED ORDER — TECHNETIUM TC 99M TETROFOSMIN IV KIT
33.0000 | PACK | Freq: Once | INTRAVENOUS | Status: AC | PRN
  Administered 2017-06-20: 33 via INTRAVENOUS
  Filled 2017-06-20: qty 33

## 2017-06-20 MED ORDER — TECHNETIUM TC 99M TETROFOSMIN IV KIT
10.3000 | PACK | Freq: Once | INTRAVENOUS | Status: AC | PRN
  Administered 2017-06-20: 10.3 via INTRAVENOUS
  Filled 2017-06-20: qty 11

## 2017-06-20 MED ORDER — REGADENOSON 0.4 MG/5ML IV SOLN
0.4000 mg | Freq: Once | INTRAVENOUS | Status: AC
  Administered 2017-06-20: 0.4 mg via INTRAVENOUS

## 2017-07-02 NOTE — Progress Notes (Signed)
Stress Test 06-20-17   LOV cardiology Dr Tomie Chinaevankar 06-18-17 epic  EKG 06-18-17 epic  CXR 09-26-16 epic

## 2017-07-02 NOTE — Patient Instructions (Addendum)
[name]  07/02/2017   Your procedure is scheduled on: 07-08-17   Report to [facility] Main  Entrance    Report to RADIOLOGY DEPARTMENT at 7:30AM   Call this number if you have problems the morning of surgery [phone]     Remember: Do not eat food or drink liquids :After Midnight.     Take these medicines the morning of surgery with A SIP OF WATER: gabapentin, percocet if needed, inhaler and nebulizer if needed                                You may not have any metal on your body including hair pins and              piercings  Do not wear jewelry, make-up, lotions, powders or perfumes, deodorant             Do not wear nail polish.  Do not shave  48 hours prior to surgery.                Do not bring valuables to the hospital. [facility] IS NOT             RESPONSIBLE   FOR VALUABLES.  Contacts, dentures or bridgework may not be worn into surgery.  Leave suitcase in the car. After surgery it may be brought to your room.                 Please read over the following fact sheets you were given: _____________________________________________________________________             How to Manage Your Diabetes Before and After Surgery  Why is it important to control my blood sugar before and after surgery? . Improving blood sugar levels before and after surgery helps healing and can limit problems. . A way of improving blood sugar control is eating a healthy diet by: o  Eating less sugar and carbohydrates o  Increasing activity/exercise o  Talking with your doctor about reaching your blood sugar goals . High blood sugars (greater than 180 mg/dL) can raise your risk of infections and slow your recovery, so you will need to focus on controlling your diabetes during the weeks before surgery. . Make sure that the doctor who takes care of your diabetes knows about your planned surgery including the date and location.  How do I manage my blood sugar  before surgery? . Check your blood sugar at least 4 times a day, starting 2 days before surgery, to make sure that the level is not too high or low. o Check your blood sugar the morning of your surgery when you wake up and every 2 hours until you get to the Short Stay unit. . If your blood sugar is less than 70 mg/dL, you will need to treat for low blood sugar: o Do not take insulin. o Treat a low blood sugar (less than 70 mg/dL) with  cup of clear juice (cranberry or apple), 4 glucose tablets, OR glucose gel. o Recheck blood sugar in 15 minutes after treatment (to make sure it is greater than 70 mg/dL). If your blood sugar is not greater than 70 mg/dL on recheck, call [phone] for further instructions. . Report your blood sugar to the short stay nurse when you get to Short Stay.  Marland Kitchen  If you are admitted to the hospital after surgery: o Your blood sugar will be checked by the staff and you will probably be given insulin after surgery (instead of oral diabetes medicines) to make sure you have good blood sugar levels. o The goal for blood sugar control after surgery is 80-180 mg/dL.   WHAT DO I DO ABOUT MY DIABETES MEDICATION?   . THE DAY BEFORE SURGERY, take  GLIPIZIDE and METFORMIN normally.      . THE MORNING OF SURGERY, DO NOT TAKE ANY DIABETIC MEDICATIONS!   Patient Signature:  Date:   Nurse Signature:  Date:   Reviewed and Endorsed by [facility] Patient Education Committee, August 2015  [facility] - Preparing for [facility] Before surgery, you can play an important role.  Because skin is not sterile, your skin needs to be as free of germs as possible.  You can reduce the number of germs on your skin by washing with CHG (chlorahexidine gluconate) soap before surgery.  CHG is an antiseptic cleaner which kills germs and bonds with the skin to continue killing germs even after washing. Please DO NOT use if you have an allergy to CHG or antibacterial soaps.  If your skin becomes  reddened/irritated stop using the CHG and inform your nurse when you arrive at Short Stay. Do not shave (including legs and underarms) for at least 48 hours prior to the first CHG shower.  You may shave your face/neck. Please follow these instructions carefully:  1.  Shower with CHG Soap the night before surgery and the  morning of Surgery.  2.  If you choose to wash your hair, wash your hair first as usual with your  normal  shampoo.  3.  After you shampoo, rinse your hair and body thoroughly to remove the  shampoo.                           4.  Use CHG as you would any other liquid soap.  You can apply chg directly  to the skin and wash                       Gently with a scrungie or clean washcloth.  5.  Apply the CHG Soap to your body ONLY FROM THE NECK DOWN.   Do not use on face/ open                           Wound or open sores. Avoid contact with eyes, ears mouth and genitals (private parts).                       Wash face,  Genitals (private parts) with your normal soap.             6.  Wash thoroughly, paying special attention to the area where your surgery  will be performed.  7.  Thoroughly rinse your body with warm water from the neck down.  8.  DO NOT shower/wash with your normal soap after using and rinsing off  the CHG Soap.                9.  Pat yourself dry with a clean towel.            10.  Wear clean pajamas.            11.  Place  clean sheets on your bed the night of your first shower and do not  sleep with pets. Day of Surgery : Do not apply any lotions/deodorants the morning of surgery.  Please wear clean clothes to the hospital/surgery center.  FAILURE TO FOLLOW THESE INSTRUCTIONS MAY RESULT IN THE CANCELLATION OF YOUR SURGERY PATIENT SIGNATURE_________________________________  NURSE SIGNATURE__________________________________  ________________________________________________________________________  WHAT IS A BLOOD TRANSFUSION? Blood Transfusion Information  A  transfusion is the replacement of blood or some of its parts. Blood is made up of multiple cells which provide different functions.  Red blood cells carry oxygen and are used for blood loss replacement.  White blood cells fight against infection.  Platelets control bleeding.  Plasma helps clot blood.  Other blood products are available for specialized needs, such as hemophilia or other clotting disorders. BEFORE THE TRANSFUSION  Who gives blood for transfusions?   Healthy volunteers who are fully evaluated to make sure their blood is safe. This is blood bank blood. Transfusion therapy is the safest it has ever been in the practice of medicine. Before blood is taken from a donor, a complete history is taken to make sure that person has no history of diseases nor engages in risky social behavior (examples are intravenous drug use or sexual activity with multiple partners). The donor's travel history is screened to minimize risk of transmitting infections, such as malaria. The donated blood is tested for signs of infectious diseases, such as HIV and hepatitis. The blood is then tested to be sure it is compatible with you in order to minimize the chance of a transfusion reaction. If you or a relative donates blood, this is often done in anticipation of surgery and is not appropriate for emergency situations. It takes many days to process the donated blood. RISKS AND COMPLICATIONS Although transfusion therapy is very safe and saves many lives, the main dangers of transfusion include:   Getting an infectious disease.  Developing a transfusion reaction. This is an allergic reaction to something in the blood you were given. Every precaution is taken to prevent this. The decision to have a blood transfusion has been considered carefully by your caregiver before blood is given. Blood is not given unless the benefits outweigh the risks. AFTER THE TRANSFUSION  Right after receiving a blood transfusion,  you will usually feel much better and more energetic. This is especially true if your red blood cells have gotten low (anemic). The transfusion raises the level of the red blood cells which carry oxygen, and this usually causes an energy increase.  The nurse administering the transfusion will monitor you carefully for complications. HOME CARE INSTRUCTIONS  No special instructions are needed after a transfusion. You may find your energy is better. Speak with your caregiver about any limitations on activity for underlying diseases you may have. SEEK MEDICAL CARE IF:   Your condition is not improving after your transfusion.  You develop redness or irritation at the intravenous (IV) site. SEEK IMMEDIATE MEDICAL CARE IF:  Any of the following symptoms occur over the next 12 hours:  Shaking chills.  You have a temperature by mouth above 102 F (38.9 C), not controlled by medicine.  Chest, back, or muscle pain.  People around you feel you are not acting correctly or are confused.  Shortness of breath or difficulty breathing.  Dizziness and fainting.  You get a rash or develop hives.  You have a decrease in urine output.  Your urine turns a dark color or changes to  pink, red, or brown. Any of the following symptoms occur over the next 10 days:  You have a temperature by mouth above 102 F (38.9 C), not controlled by medicine.  Shortness of breath.  Weakness after normal activity.  The white part of the eye turns yellow (jaundice).  You have a decrease in the amount of urine or are urinating less often.  Your urine turns a dark color or changes to pink, red, or brown. Document Released: 05/10/2000 Document Revised: 08/05/2011 Document Reviewed: 12/28/2007 [facility] Patient Information 2014 [place], [place].  _______________________________________________________________________

## 2017-07-03 ENCOUNTER — Other Ambulatory Visit: Payer: Self-pay

## 2017-07-03 ENCOUNTER — Encounter (HOSPITAL_COMMUNITY)
Admission: RE | Admit: 2017-07-03 | Discharge: 2017-07-03 | Disposition: A | Discharge status: Home / Self Care | Payer: BLUE CROSS/BLUE SHIELD | Source: Ambulatory Visit | Attending: Urology | Admitting: Urology

## 2017-07-03 ENCOUNTER — Encounter (HOSPITAL_COMMUNITY): Payer: Self-pay

## 2017-07-03 DIAGNOSIS — E119 Type 2 diabetes mellitus without complications: Secondary | ICD-10-CM | POA: Insufficient documentation

## 2017-07-03 DIAGNOSIS — J449 Chronic obstructive pulmonary disease, unspecified: Secondary | ICD-10-CM | POA: Diagnosis not present

## 2017-07-03 DIAGNOSIS — Z7984 Long term (current) use of oral hypoglycemic drugs: Secondary | ICD-10-CM | POA: Insufficient documentation

## 2017-07-03 DIAGNOSIS — M1990 Unspecified osteoarthritis, unspecified site: Secondary | ICD-10-CM | POA: Insufficient documentation

## 2017-07-03 DIAGNOSIS — N200 Calculus of kidney: Secondary | ICD-10-CM | POA: Insufficient documentation

## 2017-07-03 DIAGNOSIS — Z01812 Encounter for preprocedural laboratory examination: Secondary | ICD-10-CM | POA: Insufficient documentation

## 2017-07-03 DIAGNOSIS — Z79899 Other long term (current) drug therapy: Secondary | ICD-10-CM | POA: Insufficient documentation

## 2017-07-03 LAB — CBC
HEMATOCRIT: 40 % (ref 36.0–46.0)
Hemoglobin: 12.7 g/dL (ref 12.0–15.0)
MCH: 28.7 pg (ref 26.0–34.0)
MCHC: 31.8 g/dL (ref 30.0–36.0)
MCV: 90.5 fL (ref 78.0–100.0)
Platelets: 208 10*3/uL (ref 150–400)
RBC: 4.42 MIL/uL (ref 3.87–5.11)
RDW: 13.3 % (ref 11.5–15.5)
WBC: 8.3 10*3/uL (ref 4.0–10.5)

## 2017-07-03 LAB — HEMOGLOBIN A1C
Hgb A1c MFr Bld: 8.2 % — ABNORMAL HIGH (ref 4.8–5.6)
Mean Plasma Glucose: 188.64 mg/dL

## 2017-07-03 LAB — GLUCOSE, CAPILLARY: Glucose-Capillary: 192 mg/dL — ABNORMAL HIGH (ref 65–99)

## 2017-07-03 LAB — BASIC METABOLIC PANEL
Anion gap: 5 (ref 5–15)
BUN: 19 mg/dL (ref 6–20)
CHLORIDE: 101 mmol/L (ref 101–111)
CO2: 34 mmol/L — ABNORMAL HIGH (ref 22–32)
Calcium: 9.5 mg/dL (ref 8.9–10.3)
Creatinine, Ser: 0.86 mg/dL (ref 0.44–1.00)
GFR calc Af Amer: >60 mL/min (ref >60)
GFR calc non Af Amer: >60 mL/min (ref >60)
Glucose, Bld: 149 mg/dL — ABNORMAL HIGH (ref 65–99)
POTASSIUM: 4.6 mmol/L (ref 3.5–5.1)
Sodium: 140 mmol/L (ref 135–145)

## 2017-07-03 LAB — ABO/RH: ABO/RH(D): B POS

## 2017-07-03 NOTE — Progress Notes (Signed)
[id number] result routed via epic to Dr Jerilee FieldMatthew Eskridge

## 2017-07-07 ENCOUNTER — Other Ambulatory Visit: Payer: Self-pay | Admitting: General Surgery

## 2017-07-07 ENCOUNTER — Other Ambulatory Visit: Payer: Self-pay | Admitting: Student

## 2017-07-08 ENCOUNTER — Encounter (HOSPITAL_COMMUNITY): Payer: Self-pay

## 2017-07-08 ENCOUNTER — Ambulatory Visit (HOSPITAL_COMMUNITY)
Admission: RE | Admit: 2017-07-08 | Discharge: 2017-07-08 | Disposition: A | Discharge status: Home / Self Care | Payer: BLUE CROSS/BLUE SHIELD | Source: Ambulatory Visit | Attending: Urology | Admitting: Urology

## 2017-07-08 ENCOUNTER — Ambulatory Visit (HOSPITAL_COMMUNITY): Payer: BLUE CROSS/BLUE SHIELD | Admitting: Certified Registered Nurse Anesthetist

## 2017-07-08 ENCOUNTER — Encounter (HOSPITAL_COMMUNITY): Payer: Self-pay | Admitting: Certified Registered Nurse Anesthetist

## 2017-07-08 ENCOUNTER — Observation Stay (HOSPITAL_COMMUNITY)
Admission: RE | Admit: 2017-07-08 | Discharge: 2017-07-09 | Disposition: A | Discharge status: Home / Self Care | Payer: BLUE CROSS/BLUE SHIELD | Source: Ambulatory Visit | Attending: Urology | Admitting: Urology

## 2017-07-08 ENCOUNTER — Other Ambulatory Visit: Payer: Self-pay

## 2017-07-08 ENCOUNTER — Encounter (HOSPITAL_COMMUNITY)
Admission: RE | Disposition: A | Discharge status: Home / Self Care | Payer: Self-pay | Source: Ambulatory Visit | Attending: Urology

## 2017-07-08 ENCOUNTER — Ambulatory Visit (HOSPITAL_COMMUNITY): Payer: BLUE CROSS/BLUE SHIELD

## 2017-07-08 DIAGNOSIS — Z79899 Other long term (current) drug therapy: Secondary | ICD-10-CM | POA: Insufficient documentation

## 2017-07-08 DIAGNOSIS — Z87442 Personal history of urinary calculi: Secondary | ICD-10-CM | POA: Diagnosis not present

## 2017-07-08 DIAGNOSIS — Z7984 Long term (current) use of oral hypoglycemic drugs: Secondary | ICD-10-CM | POA: Diagnosis not present

## 2017-07-08 DIAGNOSIS — Z8619 Personal history of other infectious and parasitic diseases: Secondary | ICD-10-CM | POA: Diagnosis not present

## 2017-07-08 DIAGNOSIS — N200 Calculus of kidney: Secondary | ICD-10-CM | POA: Diagnosis present

## 2017-07-08 DIAGNOSIS — K760 Fatty (change of) liver, not elsewhere classified: Secondary | ICD-10-CM | POA: Insufficient documentation

## 2017-07-08 DIAGNOSIS — E119 Type 2 diabetes mellitus without complications: Secondary | ICD-10-CM | POA: Insufficient documentation

## 2017-07-08 DIAGNOSIS — I252 Old myocardial infarction: Secondary | ICD-10-CM | POA: Diagnosis not present

## 2017-07-08 DIAGNOSIS — J45909 Unspecified asthma, uncomplicated: Secondary | ICD-10-CM | POA: Insufficient documentation

## 2017-07-08 DIAGNOSIS — Z7982 Long term (current) use of aspirin: Secondary | ICD-10-CM | POA: Diagnosis not present

## 2017-07-08 HISTORY — PX: IR URETERAL STENT RIGHT NEW ACCESS W/O SEP NEPHROSTOMY CATH: IMG6076

## 2017-07-08 HISTORY — PX: NEPHROLITHOTOMY: SHX5134

## 2017-07-08 LAB — TYPE AND SCREEN
ABO/RH(D): B POS
Antibody Screen: NEGATIVE

## 2017-07-08 LAB — GLUCOSE, CAPILLARY
GLUCOSE-CAPILLARY: 150 mg/dL — AB (ref 65–99)
Glucose-Capillary: 173 mg/dL — ABNORMAL HIGH (ref 65–99)
Glucose-Capillary: 139 mg/dL — ABNORMAL HIGH (ref 65–99)
Glucose-Capillary: 124 mg/dL — ABNORMAL HIGH (ref 65–99)

## 2017-07-08 LAB — PROTIME-INR
INR: 0.87
PROTHROMBIN TIME: 11.7 s (ref 11.4–15.2)

## 2017-07-08 SURGERY — NEPHROLITHOTOMY PERCUTANEOUS
Anesthesia: General | Laterality: Right

## 2017-07-08 MED ORDER — BACLOFEN 5 MG HALF TABLET
5.0000 mg | ORAL_TABLET | Freq: Three times a day (TID) | ORAL | Status: DC | PRN
  Filled 2017-07-08: qty 1

## 2017-07-08 MED ORDER — FENTANYL CITRATE (PF) 100 MCG/2ML IJ SOLN
INTRAMUSCULAR | Status: AC
  Filled 2017-07-08: qty 2

## 2017-07-08 MED ORDER — TAMSULOSIN HCL 0.4 MG PO CAPS
0.4000 mg | ORAL_CAPSULE | Freq: Every day | ORAL | Status: DC
  Administered 2017-07-08 – 2017-07-09 (×2): 0.4 mg via ORAL
  Filled 2017-07-08 (×2): qty 1

## 2017-07-08 MED ORDER — MEPERIDINE HCL 50 MG/ML IJ SOLN: 6.2500 mg | INTRAMUSCULAR | Status: DC | PRN

## 2017-07-08 MED ORDER — FENTANYL CITRATE (PF) 100 MCG/2ML IJ SOLN
INTRAMUSCULAR | Status: DC | PRN
  Administered 2017-07-08 (×2): 25 ug via INTRAVENOUS
  Administered 2017-07-08 (×3): 50 ug via INTRAVENOUS

## 2017-07-08 MED ORDER — ONDANSETRON HCL 4 MG/2ML IJ SOLN
INTRAMUSCULAR | Status: AC
  Filled 2017-07-08: qty 2

## 2017-07-08 MED ORDER — MORPHINE SULFATE ER 30 MG PO TBCR
30.0000 mg | EXTENDED_RELEASE_TABLET | Freq: Two times a day (BID) | ORAL | Status: DC
Start: 2017-07-08 — End: 2017-07-09
  Administered 2017-07-08 – 2017-07-09 (×2): 30 mg via ORAL
  Filled 2017-07-08 (×2): qty 1

## 2017-07-08 MED ORDER — ZOLPIDEM TARTRATE 5 MG PO TABS
5.0000 mg | ORAL_TABLET | Freq: Every day | ORAL | Status: DC
  Administered 2017-07-08: 5 mg via ORAL
  Filled 2017-07-08: qty 1

## 2017-07-08 MED ORDER — HYDROMORPHONE HCL 1 MG/ML IJ SOLN
INTRAMUSCULAR | Status: AC
  Filled 2017-07-08: qty 2

## 2017-07-08 MED ORDER — INSULIN ASPART 100 UNIT/ML ~~LOC~~ SOLN: 0.0000 [IU] | Freq: Every day | SUBCUTANEOUS | Status: DC

## 2017-07-08 MED ORDER — ONDANSETRON HCL 4 MG/2ML IJ SOLN
4.0000 mg | INTRAMUSCULAR | Status: DC | PRN
Start: 2017-07-08 — End: 2017-07-09

## 2017-07-08 MED ORDER — ROCURONIUM BROMIDE 10 MG/ML (PF) SYRINGE
PREFILLED_SYRINGE | INTRAVENOUS | Status: DC | PRN
  Administered 2017-07-08: 50 mg via INTRAVENOUS

## 2017-07-08 MED ORDER — IOPAMIDOL (ISOVUE-370) INJECTION 76%
INTRAVENOUS | Status: AC
  Administered 2017-07-08: 75 mL via INTRAVENOUS
  Filled 2017-07-08: qty 100

## 2017-07-08 MED ORDER — DEXAMETHASONE SODIUM PHOSPHATE 10 MG/ML IJ SOLN
INTRAMUSCULAR | Status: DC | PRN
  Administered 2017-07-08: 10 mg via INTRAVENOUS

## 2017-07-08 MED ORDER — SODIUM CHLORIDE 0.9 % IV SOLN
INTRAVENOUS | Status: DC | PRN
  Administered 2017-07-08: 50 mL

## 2017-07-08 MED ORDER — FENTANYL CITRATE (PF) 100 MCG/2ML IJ SOLN
INTRAMUSCULAR | Status: AC
  Filled 2017-07-08: qty 4

## 2017-07-08 MED ORDER — CEFAZOLIN SODIUM-DEXTROSE 2-4 GM/100ML-% IV SOLN
INTRAVENOUS | Status: AC
  Filled 2017-07-08: qty 100

## 2017-07-08 MED ORDER — CEFAZOLIN SODIUM-DEXTROSE 2-4 GM/100ML-% IV SOLN: 2.0000 g | INTRAVENOUS | Status: DC

## 2017-07-08 MED ORDER — SUGAMMADEX SODIUM 200 MG/2ML IV SOLN
INTRAVENOUS | Status: AC
  Filled 2017-07-08: qty 2

## 2017-07-08 MED ORDER — IOPAMIDOL (ISOVUE-300) INJECTION 61%
25.0000 mL | Freq: Once | INTRAVENOUS | Status: AC | PRN
  Administered 2017-07-08: 20 mL

## 2017-07-08 MED ORDER — IOPAMIDOL (ISOVUE-300) INJECTION 61%
INTRAVENOUS | Status: AC
  Administered 2017-07-08: 20 mL
  Filled 2017-07-08: qty 50

## 2017-07-08 MED ORDER — ALBUTEROL SULFATE (2.5 MG/3ML) 0.083% IN NEBU: 2.5000 mg | INHALATION_SOLUTION | Freq: Four times a day (QID) | RESPIRATORY_TRACT | Status: DC | PRN

## 2017-07-08 MED ORDER — LIDOCAINE 2% (20 MG/ML) 5 ML SYRINGE
INTRAMUSCULAR | Status: DC | PRN
  Administered 2017-07-08: 40 mg via INTRAVENOUS

## 2017-07-08 MED ORDER — SODIUM CHLORIDE 0.9 % IV SOLN
INTRAVENOUS | Status: DC
  Administered 2017-07-08 – 2017-07-09 (×2): via INTRAVENOUS

## 2017-07-08 MED ORDER — MIDAZOLAM HCL 2 MG/2ML IJ SOLN
INTRAMUSCULAR | Status: AC
  Filled 2017-07-08: qty 4

## 2017-07-08 MED ORDER — PROPOFOL 10 MG/ML IV BOLUS
INTRAVENOUS | Status: DC | PRN
  Administered 2017-07-08: 200 mg via INTRAVENOUS

## 2017-07-08 MED ORDER — SODIUM CHLORIDE 0.9 % IR SOLN
Status: DC | PRN
  Administered 2017-07-08: 1000 mL

## 2017-07-08 MED ORDER — LIDOCAINE HCL 1 % IJ SOLN
INTRAMUSCULAR | Status: AC
  Filled 2017-07-08: qty 20

## 2017-07-08 MED ORDER — CEFAZOLIN SODIUM-DEXTROSE 2-4 GM/100ML-% IV SOLN
2.0000 g | Freq: Once | INTRAVENOUS | Status: AC
  Administered 2017-07-08: 2 g via INTRAVENOUS

## 2017-07-08 MED ORDER — INSULIN ASPART 100 UNIT/ML ~~LOC~~ SOLN
0.0000 [IU] | Freq: Three times a day (TID) | SUBCUTANEOUS | Status: DC
  Administered 2017-07-08: 2 [IU] via SUBCUTANEOUS
  Administered 2017-07-09 (×2): 3 [IU] via SUBCUTANEOUS

## 2017-07-08 MED ORDER — MIDAZOLAM HCL 2 MG/2ML IJ SOLN: 0.5000 mg | Freq: Once | INTRAMUSCULAR | Status: DC | PRN

## 2017-07-08 MED ORDER — SENNOSIDES-DOCUSATE SODIUM 8.6-50 MG PO TABS
2.0000 | ORAL_TABLET | Freq: Every day | ORAL | Status: DC
  Administered 2017-07-08: 2 via ORAL
  Filled 2017-07-08: qty 2

## 2017-07-08 MED ORDER — OXYCODONE HCL 5 MG PO TABS
5.0000 mg | ORAL_TABLET | ORAL | Status: DC | PRN
  Administered 2017-07-09 (×2): 5 mg via ORAL
  Filled 2017-07-08 (×2): qty 1

## 2017-07-08 MED ORDER — SUGAMMADEX SODIUM 200 MG/2ML IV SOLN
INTRAVENOUS | Status: DC | PRN
  Administered 2017-07-08: 200 mg via INTRAVENOUS

## 2017-07-08 MED ORDER — ACETAMINOPHEN 500 MG PO TABS
1000.0000 mg | ORAL_TABLET | Freq: Four times a day (QID) | ORAL | Status: DC
  Administered 2017-07-08 – 2017-07-09 (×3): 1000 mg via ORAL
  Filled 2017-07-08 (×3): qty 2

## 2017-07-08 MED ORDER — PROPOFOL 10 MG/ML IV BOLUS
INTRAVENOUS | Status: AC
  Filled 2017-07-08: qty 20

## 2017-07-08 MED ORDER — GABAPENTIN 400 MG PO CAPS
800.0000 mg | ORAL_CAPSULE | Freq: Four times a day (QID) | ORAL | Status: DC
  Administered 2017-07-08 – 2017-07-09 (×4): 800 mg via ORAL
  Filled 2017-07-08 (×4): qty 2

## 2017-07-08 MED ORDER — SODIUM CHLORIDE 0.9 % IV SOLN: INTRAVENOUS | Status: DC

## 2017-07-08 MED ORDER — MORPHINE SULFATE (PF) 4 MG/ML IV SOLN
2.0000 mg | INTRAVENOUS | Status: DC | PRN
  Administered 2017-07-08: 2 mg via INTRAVENOUS
  Filled 2017-07-08: qty 1

## 2017-07-08 MED ORDER — LACTATED RINGERS IV SOLN
INTRAVENOUS | Status: DC
  Administered 2017-07-08 (×3): via INTRAVENOUS

## 2017-07-08 MED ORDER — BELLADONNA ALKALOIDS-OPIUM 16.2-60 MG RE SUPP: 1.0000 | Freq: Four times a day (QID) | RECTAL | Status: DC | PRN

## 2017-07-08 MED ORDER — CIPROFLOXACIN IN D5W 400 MG/200ML IV SOLN
INTRAVENOUS | Status: DC | PRN
  Administered 2017-07-08: 400 mg via INTRAVENOUS

## 2017-07-08 MED ORDER — SODIUM CHLORIDE 0.9 % IR SOLN
Status: DC | PRN
  Administered 2017-07-08: 18000 mL

## 2017-07-08 MED ORDER — LIDOCAINE 2% (20 MG/ML) 5 ML SYRINGE
INTRAMUSCULAR | Status: AC
  Filled 2017-07-08: qty 5

## 2017-07-08 MED ORDER — PROMETHAZINE HCL 25 MG/ML IJ SOLN: 6.2500 mg | INTRAMUSCULAR | Status: DC | PRN

## 2017-07-08 MED ORDER — ROCURONIUM BROMIDE 10 MG/ML (PF) SYRINGE
PREFILLED_SYRINGE | INTRAVENOUS | Status: AC
  Filled 2017-07-08: qty 5

## 2017-07-08 MED ORDER — CIPROFLOXACIN IN D5W 400 MG/200ML IV SOLN
INTRAVENOUS | Status: AC
  Filled 2017-07-08: qty 200

## 2017-07-08 MED ORDER — HYDROMORPHONE HCL 1 MG/ML IJ SOLN
0.2500 mg | INTRAMUSCULAR | Status: DC | PRN
  Administered 2017-07-08 (×3): 0.5 mg via INTRAVENOUS

## 2017-07-08 MED ORDER — MIDAZOLAM HCL 2 MG/2ML IJ SOLN
INTRAMUSCULAR | Status: AC | PRN
  Administered 2017-07-08 (×3): 1 mg via INTRAVENOUS

## 2017-07-08 MED ORDER — CEFAZOLIN SODIUM-DEXTROSE 1-4 GM/50ML-% IV SOLN
1.0000 g | Freq: Three times a day (TID) | INTRAVENOUS | Status: DC
  Administered 2017-07-08 – 2017-07-09 (×3): 1 g via INTRAVENOUS
  Filled 2017-07-08 (×4): qty 50

## 2017-07-08 MED ORDER — TIZANIDINE HCL 2 MG PO TABS
2.0000 mg | ORAL_TABLET | Freq: Three times a day (TID) | ORAL | Status: DC | PRN
  Filled 2017-07-08: qty 1

## 2017-07-08 MED ORDER — ONDANSETRON HCL 4 MG/2ML IJ SOLN
INTRAMUSCULAR | Status: DC | PRN
  Administered 2017-07-08: 4 mg via INTRAVENOUS

## 2017-07-08 MED ORDER — DOCUSATE SODIUM 100 MG PO CAPS
100.0000 mg | ORAL_CAPSULE | Freq: Two times a day (BID) | ORAL | Status: DC
Start: 2017-07-08 — End: 2017-07-09
  Administered 2017-07-08 – 2017-07-09 (×2): 100 mg via ORAL
  Filled 2017-07-08 (×2): qty 1

## 2017-07-08 MED ORDER — LIDOCAINE HCL 1 % IJ SOLN
INTRAMUSCULAR | Status: AC | PRN
  Administered 2017-07-08: 10 mL

## 2017-07-08 MED ORDER — FENTANYL CITRATE (PF) 100 MCG/2ML IJ SOLN
INTRAMUSCULAR | Status: AC | PRN
  Administered 2017-07-08: 50 ug via INTRAVENOUS
  Administered 2017-07-08 (×2): 25 ug via INTRAVENOUS

## 2017-07-08 MED ORDER — IOPAMIDOL (ISOVUE-370) INJECTION 76%
75.0000 mL | Freq: Once | INTRAVENOUS | Status: AC | PRN
  Administered 2017-07-08: 75 mL via INTRAVENOUS

## 2017-07-08 MED ORDER — ALBUTEROL SULFATE (2.5 MG/3ML) 0.083% IN NEBU: 3.0000 mL | INHALATION_SOLUTION | RESPIRATORY_TRACT | Status: DC | PRN

## 2017-07-08 SURGICAL SUPPLY — 55 items
APL SKNCLS STERI-STRIP NONHPOA (GAUZE/BANDAGES/DRESSINGS) ×2
BAG URINE DRAINAGE (UROLOGICAL SUPPLIES) ×2 IMPLANT
BASKET STONE NITINOL 3FR/115 (UROLOGICAL SUPPLIES) ×1 IMPLANT
BASKET STONE NITINOL 3FRX115MB (UROLOGICAL SUPPLIES) IMPLANT
BASKET ZERO TIP NITINOL 2.4FR (BASKET) ×2 IMPLANT
BENZOIN TINCTURE PRP APPL 2/3 (GAUZE/BANDAGES/DRESSINGS) ×4 IMPLANT
BSKT STON RTRVL ZERO TP 2.4FR (BASKET) ×1
CATCHER STONE W/TUBE ADAPTER (UROLOGICAL SUPPLIES) ×1 IMPLANT
CATH BEACON 5.038 65CM KMP-01 (CATHETERS) ×1 IMPLANT
CATH FOLEY 2W COUNCIL 20FR 5CC (CATHETERS) ×1 IMPLANT
CATH FOLEY 2WAY SLVR  5CC 18FR (CATHETERS) ×1
CATH FOLEY 2WAY SLVR 5CC 18FR (CATHETERS) ×1 IMPLANT
CATH IMAGER II 65CM (CATHETERS) ×2 IMPLANT
CATH INTERMIT  6FR 70CM (CATHETERS) ×2 IMPLANT
CATH ROBINSON RED A/P 20FR (CATHETERS) IMPLANT
CATH X-FORCE N30 NEPHROSTOMY (TUBING) ×2 IMPLANT
CHLORAPREP W/TINT 26ML (MISCELLANEOUS) ×2 IMPLANT
COVER SURGICAL LIGHT HANDLE (MISCELLANEOUS) ×2 IMPLANT
DRAPE C-ARM 42X120 X-RAY (DRAPES) ×2 IMPLANT
DRAPE LINGEMAN PERC (DRAPES) ×2 IMPLANT
DRAPE SURG IRRIG POUCH 19X23 (DRAPES) ×2 IMPLANT
DRAPE UTILITY XL STRL (DRAPES) ×2 IMPLANT
DRSG PAD ABDOMINAL 8X10 ST (GAUZE/BANDAGES/DRESSINGS) ×4 IMPLANT
DRSG TEGADERM 8X12 (GAUZE/BANDAGES/DRESSINGS) ×3 IMPLANT
FIBER LASER FLEXIVA 1000 (UROLOGICAL SUPPLIES) IMPLANT
FIBER LASER FLEXIVA 365 (UROLOGICAL SUPPLIES) IMPLANT
FIBER LASER FLEXIVA 550 (UROLOGICAL SUPPLIES) IMPLANT
FIBER LASER TRAC TIP (UROLOGICAL SUPPLIES) IMPLANT
GAUZE SPONGE 4X4 12PLY STRL (GAUZE/BANDAGES/DRESSINGS) ×2 IMPLANT
GLOVE BIOGEL M STRL SZ7.5 (GLOVE) ×2 IMPLANT
GOWN STRL REUS W/TWL XL LVL3 (GOWN DISPOSABLE) ×2 IMPLANT
GUIDEWIRE ANG ZIPWIRE 038X150 (WIRE) ×2 IMPLANT
KIT BASIN OR (CUSTOM PROCEDURE TRAY) ×2 IMPLANT
MANIFOLD NEPTUNE II (INSTRUMENTS) ×2 IMPLANT
NDL TROCAR 18X15 ECHO (NEEDLE) ×1 IMPLANT
NEEDLE TROCAR 18X15 ECHO (NEEDLE) ×2 IMPLANT
NS IRRIG 1000ML POUR BTL (IV SOLUTION) ×2 IMPLANT
PACK CYSTO (CUSTOM PROCEDURE TRAY) ×2 IMPLANT
PAD ABD 8X10 STRL (GAUZE/BANDAGES/DRESSINGS) ×1 IMPLANT
PROBE LITHOCLAST ULTRA 3.8X403 (UROLOGICAL SUPPLIES) ×1 IMPLANT
PROBE PNEUMATIC 1.0MMX570MM (UROLOGICAL SUPPLIES) ×2 IMPLANT
SET IRRIG Y TYPE TUR BLADDER L (SET/KITS/TRAYS/PACK) ×2 IMPLANT
SHEATH PEELAWAY SET 9 (SHEATH) ×2 IMPLANT
SPONGE LAP 4X18 X RAY DECT (DISPOSABLE) ×2 IMPLANT
STENT ENDOURETEROTOMY 7-14 26C (STENTS) IMPLANT
STONE CATCHER W/TUBE ADAPTER (UROLOGICAL SUPPLIES) ×2 IMPLANT
SUT SILK 2 0 30  PSL (SUTURE) ×1
SUT SILK 2 0 30 PSL (SUTURE) ×1 IMPLANT
SYR 10ML LL (SYRINGE) ×2 IMPLANT
SYR 20CC LL (SYRINGE) ×4 IMPLANT
TOWEL OR 17X26 10 PK STRL BLUE (TOWEL DISPOSABLE) ×2 IMPLANT
TOWEL OR NON WOVEN STRL DISP B (DISPOSABLE) ×2 IMPLANT
TRAY FOLEY W/METER SILVER 16FR (SET/KITS/TRAYS/PACK) ×2 IMPLANT
TUBING CONNECTING 10 (TUBING) ×6 IMPLANT
WATER STERILE IRR 1000ML POUR (IV SOLUTION) ×2 IMPLANT

## 2017-07-08 NOTE — Anesthesia Preprocedure Evaluation (Addendum)
Anesthesia Evaluation  Patient identified by MRN, date of birth, ID band Patient awake    Reviewed: Allergy & Precautions, NPO status , Patient's Chart, lab work & pertinent test results  History of Anesthesia Complications Negative for: history of anesthetic complications  Airway Mallampati: II  TM Distance: >3 FB Neck ROM: Full    Dental  (+) Missing, Chipped, Dental Advisory Given   Pulmonary COPD,  COPD inhaler,    breath sounds clear to auscultation       Cardiovascular (-) angina Rhythm:Regular Rate:Normal  06/20/17 Stress: Myocardial perfusion is normal. The study is normal. This is a low risk study. LV cavity size is normal.  '10 cath: normal coronaries   Neuro/Psych negative neurological ROS     GI/Hepatic negative GI ROS, Neg liver ROS,   Endo/Other  diabetes (glu 173), Oral Hypoglycemic AgentsMorbid obesity  Renal/GU stones     Musculoskeletal  (+) Arthritis , Osteoarthritis,    Abdominal   Peds  Hematology negative hematology ROS (+)   Anesthesia Other Findings   Reproductive/Obstetrics                            Anesthesia Physical Anesthesia Plan  ASA: II  Anesthesia Plan: General   Post-op Pain Management:    Induction: Intravenous  PONV Risk Score and Plan: 4 or greater and Ondansetron, Dexamethasone and Scopolamine patch - Pre-op  Airway Management Planned: Oral ETT  Additional Equipment:   Intra-op Plan:   Post-operative Plan: Extubation in OR  Informed Consent: I have reviewed the patients History and Physical, chart, labs and discussed the procedure including the risks, benefits and alternatives for the proposed anesthesia with the patient or authorized representative who has indicated his/her understanding and acceptance.   Dental advisory given  Plan Discussed with: CRNA and Surgeon  Anesthesia Plan Comments: (Plan routine monitors, GETA)         Anesthesia Quick Evaluation

## 2017-07-08 NOTE — Procedures (Signed)
Interventional Radiology Procedure Note  Procedure: Right ureteral catheter placement via percutaneous renal access  Complications: None  Estimated Blood Loss: < 10 mL  Findings: Large calculus occupying entire renal pelvis.  Collecting system completely decompressed by US.   75 mL Iso 370 given IV to opacify collecting system. Via LP calyceal access, 5 Fr catheter advanced beyond pelvic calculus down ureter and into bladder. For PCNL with Dr. Mena GoesEskridge to follow.  Jodi MarbleGlenn T. Fredia SorrowYamagata, M.D Pager:  [phone]

## 2017-07-08 NOTE — Sedation Documentation (Signed)
Patient is resting comfortably. 

## 2017-07-08 NOTE — Anesthesia Postprocedure Evaluation (Signed)
Anesthesia Post Note  Patient: Jacobo ForestLinda S Carter  Procedure(s) Performed: NEPHROLITHOTOMY PERCUTANEOUS (Right )     Patient location during evaluation: PACU Anesthesia Type: General Level of consciousness: awake and alert, oriented and patient cooperative Pain management: pain level controlled Vital Signs Assessment: post-procedure vital signs reviewed and stable Respiratory status: spontaneous breathing, nonlabored ventilation, respiratory function stable and patient connected to nasal cannula oxygen Cardiovascular status: blood pressure returned to baseline and stable Postop Assessment: no apparent nausea or vomiting Anesthetic complications: no    Last Vitals:  Vitals:   07/08/17 1430 07/08/17 1445  BP: (!) 120/56 103/75  Pulse: (!) 38 73  Resp: 12 10  Temp:    SpO2: 100% 99%    Last Pain:  Vitals:   07/08/17 1350  TempSrc:   PainSc: 6                  Christie Viscomi,E. Clarene Curran

## 2017-07-08 NOTE — Progress Notes (Signed)
Dr Jean RosenthalJackson aware of Vent Bigeminy in pacu. No orders received at this time

## 2017-07-08 NOTE — Anesthesia Procedure Notes (Signed)
Procedure Name: Intubation Date/Time: 07/08/2017 11:41 AM Performed by: [place] Bintou Lafata C, CRNA Pre-anesthesia Checklist: Patient identified, Emergency Drugs available, Suction available and Patient being monitored Patient Re-evaluated:Patient Re-evaluated prior to induction Oxygen Delivery Method: Circle system utilized Preoxygenation: Pre-oxygenation with 100% oxygen Induction Type: IV induction Ventilation: Mask ventilation without difficulty Laryngoscope Size: Mac and 3 Grade View: Grade I Tube type: Oral Tube size: 7.0 mm Number of attempts: 1 Airway Equipment and Method: Stylet and Oral airway Placement Confirmation: ETT inserted through vocal cords under direct vision,  positive ETCO2 and breath sounds checked- equal and bilateral Secured at: 20 cm Tube secured with: Tape Dental Injury: Teeth and Oropharynx as per pre-operative assessment

## 2017-07-08 NOTE — H&P (Signed)
Chief Complaint: right-sided nephrolithiasis  Referring Physician:Dr. Jerilee Field  Supervising Physician: Irish Lack  Patient Status: [facility] - Out-pt  HPI: [name] is a 62 y.o. female who began having left-side back pain in January along with hematuria.  She saw her PCP who referred her to Dr. Mena Goes.  He ordered a CT scan of her abdomen and she was found to have right-sided nephrolithiasis.  She has been scheduled today for percutaneous access and then for nephrolithotomy in the OR.  She has no new complaints today.  Past Medical History:  Past Medical History:  Diagnosis Date  . Arthritis   . Asthma   . History of GI bleed    2010--- UPPER ESOPHAGITIS/ DUODERAL ULCER/ EROSION  . History of hiatal hernia   . History of kidney stones   . History of shingles    2013  . Myocardial infarction (HCC) 10 years ago   . Nephrolithiasis    BILATERAL  . Right ureteral stone   . Type 2 diabetes, diet controlled (HCC)   . Urgency of urination     Past Surgical History:  Past Surgical History:  Procedure Laterality Date  . CARDIAC CATHETERIZATION  02-10-2009  DR BENSIMHON   NORMAL CORONARIES/  LOW NORMAL LVF WITHOUT WALL MOTION ABNORMALITIES  . CARDIAC CATHETERIZATION  11-21-2010  DR Jacinto Halim   NORMAL CORONARIES/  EF 50-55%  . CHOLECYSTECTOMY  1990s   back in [place]   . CYSTOSCOPY W/ URETERAL STENT PLACEMENT Right 09/04/2013   Procedure: CYSTOSCOPY WITH RETROGRADE PYELOGRAM/URETERAL STENT PLACEMENT;  Surgeon: Milford Cage, MD;  Location: [facility];  Service: Urology;  Laterality: Right;  . CYSTOSCOPY WITH RETROGRADE PYELOGRAM, URETEROSCOPY AND STENT PLACEMENT Right 09/27/2013   Procedure: CYSTOSCOPY WITH RETROGRADE PYELOGRAM, URETEROSCOPY AND STENT EXCHANGE;  Surgeon: Milford Cage, MD;  Location: [facility];  Service: Urology;  Laterality: Right;  . HOLMIUM LASER APPLICATION Right 09/27/2013   Procedure: HOLMIUM LASER APPLICATION;  Surgeon:  Milford Cage, MD;  Location: [facility];  Service: Urology;  Laterality: Right;  . HYSTEROSCOPY W/D&C  01-25-2003  . LAPAROSCOPIC ASSISTED VAGINAL HYSTERECTOMY  03-02-2003   W/  BILATERAL SALPINGOOPHORECTOMY  . LAPAROSCOPY  EXTENSIVE LYSIS ADHESIONS/  REDO PARAESOPHAGEAL HIATAL HERNIA WITH PRIMARY CLOSURE AND MESH/ NISSEN FUNDOPLICATION (1cm)/  REPAIR GASTROTOMY  08-22-2009  . NISSEN FUNDOPLICATION  2000   W/  CHOLECYSTECTOMY  . SHOULDER ARTHROSCOPY WITH SUBACROMIAL DECOMPRESSION, ROTATOR CUFF REPAIR AND BICEP TENDON REPAIR Left 08-02-2010   AND LABRAL DEBRIDEMENT  . TOTAL KNEE ARTHROPLASTY Right 07-01-2006  . TOTAL KNEE REVISION  02/25/2012   Procedure: TOTAL KNEE REVISION;  Surgeon: Cammy Copa, MD;  Location: [facility] OR;  Service: Orthopedics;  Laterality: Right;  Revise right total knee arthroplasty  . TUBAL LIGATION  1990  . WRIST FUSION Left 2004   RETAINED HARDWARE    Family History:  Family History  Problem Relation Age of Onset  . Heart disease Father     Social History:  reports that  has never smoked. she has never used smokeless tobacco. She reports that she does not drink alcohol or use drugs.  Allergies: No Known Allergies  Medications: Medications reviewed in epic  Please HPI for pertinent positives, otherwise complete 10 system ROS negative.  Mallampati Score: MD Evaluation Airway: WNL Heart: WNL Abdomen: WNL Chest/ Lungs: WNL ASA  Classification: 2 Mallampati/Airway Score: One  Physical Exam: Temp (F)   98.5  98.5 (36.9)  02/12 0721  Pulse  Rate   68  68  02/12 0721  Resp   16  16  02/12 0721  BP   109/82  109/82  02/12 0721  SpO2 (%)   95  95  02/12 0721  Weight (lb)   179  179 lb (81.2 kg)  02   General: pleasant, WD, WN white female who is laying in bed in NAD HEENT: head is normocephalic, atraumatic.  Sclera are noninjected.  PERRL.  Ears and nose without any masses or lesions.  Mouth is pink and moist Heart:  regular, rate, and rhythm.  Normal s1,s2. No obvious murmurs, gallops, or rubs noted.  Palpable radial pulses bilaterally Lungs: CTAB, no wheezes, rhonchi, or rales noted.  Respiratory effort nonlabored Abd: soft, NT, ND, +BS, no masses, hernias, or organomegaly Psych: A&Ox3 with an appropriate affect.   Labs: Labs from 2-7 reviewed. INR 0.87 today  Imaging: No results found.  Assessment/Plan 1. Right nephrolithiasis  Plan to proceed with percutaneous nephroureteral access in IR first and then the patient will go to the OR with Dr. Mena GoesEskridge to undergo nephrolithotomy.  Her labs and vitals have been reviewed. Risks and benefits of percutaneous nephroureteral access were discussed with the patient including, but not limited to, infection, bleeding, significant bleeding causing loss or decrease in renal function or damage to adjacent structures.   All of the patient's questions were answered, patient is agreeable to proceed.  Consent signed and in chart.   Thank you for this interesting consult.  I greatly enjoyed meeting Jacobo ForestLinda S Shock and look forward to participating in their care.  A copy of this report was sent to the requesting provider on this date.  Electronically Signed: Letha CapeKelly E Maxim Bedel 07/08/2017, 8:56 AM   I spent a total of  30 Minutes   in face to face in clinical consultation, greater than 50% of which was counseling/coordinating care for nephrolithiasis

## 2017-07-08 NOTE — Op Note (Signed)
PCNL Operative Note  Preoperative Diagnosis: Right nephrolithiasis    Postoperative Diagnosis: Same   Procedure(s) Performed: 1. Right percutaneous nephrostolithotomy for stone burden < = 2 cm 2. Antegrade nephrostogram with nephrostomy tube placement/neohrostomy tube exchange 3. Simple Foley catheter placement 4. Intraoperative fluoroscopy with interpretation less than 1 hour  Surgeons: Primary: Jerilee FieldEskridge, Corneisha Alvi, MD  Resident Assistant: Callie FieldingFilippou, Pauline MD   Anesthesia: Anesthesiologist: Jairo BenJackson, Carswell, MD CRNA: [place], Stephanie C, CRNA; Thornell MuleStubblefield, Howard G, CRNA  Endotracheal Tube  Indications for Surgery:  [name] is a 62 y.o.-year-old female with a history of nephrolithiasis.  The patient was evaluated and noted with a 20x16x11 mm right renal pelvic stone and several smaller nonobstructing nephroliths.  The patient presents today for percutaneous treatment of Right kidney stone. The risks and benefits of the procedure were discussed with the patient who wishes to proceed.     Operative Findings: Successful  treatment of the entirety of the stone burden.  Antegrade nephrostogram post-treatment demonstrated no contrast extravasation. Successful placement of nephrostomy tube.  Procedure Details:       The patient underwent right nephroureteral catheter placement by interventional radiology prior to procedure.  She was correctly identified in the preoperative holding area where written informed consent as well potential risks and complications were reviewed. The patient was brought back to the operative suite where a preinduction timeout was performed. After correct information was verified, general anesthesia was induced via endotracheal tube.  A 16 French Foley catheter was placed in standard fashion.  The patient was then gently placed in prone position.  Sequential compression devices were placed for VTE prophylaxis. The patient was prepped and draped in the usual  sterile fashion and appropriate periprocedural antibiotics were administered. A second timeout was performed.  We began the case by injecting 50% Isovue contrast through the nephroureteral catheter.  The right collecting system was outlined, indicating appropriate placement.  A sensor wire was advanced through the nephroureteral catheter into the bladder and the catheter removed.  A Kumpe catheter was inserted over a wire into the right collecting system, and this sensor wire was exchanged for a superstiff.  A dual-lumen 6 French catheter was used to dilate the existing nephroureteral tract, and a second Super Stiff wire was placed as our safety wire, with fluoroscopy indicating presence within the bladder.   Next, we developed our percutaneous tract with the advancement of a 30 French x 20 cm Nephromax balloon dilator.  After this, a 30 French sheath was advanced to the edge of the distal calyx on fluoroscopy.     We then performed rigid nephroscopy with the lithotrite. Immediately upon entrance into the collecting system, we encountered the stone and we then proceeded to treat the stone with lithotripsy.  The stone was entirely treated using the lithotrite and larger fragments grasped and removed. The smaller fragments suctioned and evacuated. We then switched to flexible nephroscopy and visualized all calyces of the kidney, encountering smaller stones and removing them.  We advanced a basket down the ureter and no additional stones were removed after passage of the basket.  The UPJ was noted to be friable with small amount of clot attached to it , and for this reason due to concern for obstruction we elected to leave a JJ ureteral stent.    At the conclusion of the procedure, we repeated flexible nephroscopy in the kidney and withdrew our sheath over our rigid nephroscope to the edge of renal parenchyma which did not reveal any residual  stone fragments or the LP stones.   At this point, we elected to  leave our ureteral stent and nephrostomy tube.  Over a second sensor wire, the rigid nephroscope was backloaded with visualization of the UPJ.  A 6 [place] by 24 cm JJ ureteral stent was passed over our wire with fluoroscopy confirming placement of the distal curl within the bladder, and visualization of the proximal curl within the renal pelvis after removal of our wire.  We passed a 20 French straight tip nephrostomy tube through her nephrostomy tract into the renal pelvis. Antegrade nephrostogram demonstrated complete filling of the entire renal collecting system with minimal contrast extravasation from the access tract and no extravasation from the collecting system or renal pelvis. Satisfactory placement of her nephrostomy tube in the renal pelvis was noted.  2 mL's of sterile water was placed in the balloon and the nephrostomy tube was affixed to the patient's skin using a single 0 silk suture in an interrupted fashion.    The patient had recently been stating that she had been having left flank pain.  Additional fluoroscopy was obtained of the left kidney and ureter.  Due to IV contrast administration by the interventional radiologist, the left collecting system was outlined.  The ureter appeared of normal caliber, no hydronephrosis was present.  A small opacity in the upper pole of the left kidney was noted, consistent with previous CT scans showing a stone in this area.  Unlikely that her flank pain is due to left collecting system obstruction, thus no intervention was made on the left side.  We then placed the nephrostomy tube to drainage.  The site was then dressed in the usual gauze and tape dressing and the patient was carefully returned to supine position. The patient was awoken from anesthesia and taken to the recovery area.  Estimated Blood Loss:  200cc         Drains: 20 French straight tip nephrostomy tube.  16 French Foley catheter with 10 cc in the balloon.  Both to drainage.          Total IV Fluids: See anesthesia record         Specimens: Stone for chemical analysis         Implants: 6Fr x 24cm JJ ureteral stent without dangler   Complications:  None         Disposition: PACU - hemodynamically stable.         Condition: stable    Post-Op Plan/Instructions:    1. Admit patient to Urology Service for routine postoperative care. 2.  We will perform laboratory studies in the AM. Will likely perform nephrostomy tube capping trial in the AM, and remove nephrostomy tube prior to discharge in PM POD1.   Callie Fielding, MD Urologic Surgery Resident   Attending attestation: I was present and scrubbed for the entire procedure.

## 2017-07-08 NOTE — H&P (Signed)
UROLOGIC SURGERY HISTORY AND PHYSICAL  DATE:  07/08/17  HISTORY:  [name] is a 62 y.o. female with a history of renal stones. The problem is on the right side. She first stated noticing pain on approximately 02/24/2017. This is not her first kidney stone. She has had 1 stones prior to getting this one. She is currently having back pain. She denies having flank pain, groin pain, nausea, vomiting, fever, and chills. She has not caught a stone in her urine strainer since her symptoms began.   She has had ureteroscopy for treatment of her stones in the past.   May 29, 2017 UA + LE on dip, Cx negative. Jun 06, 2017 CT 20x16x11 mm right renal pelvic stone (HU 1070, visible, ssd 10 cm).   Her May 2018 BUN was 14, cr 0.81.   She's had flank pain on and off for a few months but worse over the past couple weeks. Interestingly, her pain is on the left side. She has chronic pain managed on Percocet and MS Contin. She is staying well-hydrated and has had no fever. She has seen blood in the urine but no blood clots. She has had no chest pain or shortness of breath. She is not on anticoagulation for any medical problems.   .  They present today for a R PCNL.      Past Medical History:  Diagnosis Date  . Arthritis   . Asthma   . History of GI bleed    2010--- UPPER ESOPHAGITIS/ DUODERAL ULCER/ EROSION  . History of hiatal hernia   . History of kidney stones   . History of shingles    2013  . Myocardial infarction (HCC) 10 years ago   . Nephrolithiasis    BILATERAL  . Right ureteral stone   . Type 2 diabetes, diet controlled (HCC)   . Urgency of urination     Past Surgical History:  Procedure Laterality Date  . CARDIAC CATHETERIZATION  02-10-2009  DR BENSIMHON   NORMAL CORONARIES/  LOW NORMAL LVF WITHOUT WALL MOTION ABNORMALITIES  . CARDIAC CATHETERIZATION  11-21-2010  DR Jacinto Halim   NORMAL CORONARIES/  EF 50-55%  . CHOLECYSTECTOMY  1990s   back in [place]   . CYSTOSCOPY W/ URETERAL STENT  PLACEMENT Right 09/04/2013   Procedure: CYSTOSCOPY WITH RETROGRADE PYELOGRAM/URETERAL STENT PLACEMENT;  Surgeon: Milford Cage, MD;  Location: [facility];  Service: Urology;  Laterality: Right;  . CYSTOSCOPY WITH RETROGRADE PYELOGRAM, URETEROSCOPY AND STENT PLACEMENT Right 09/27/2013   Procedure: CYSTOSCOPY WITH RETROGRADE PYELOGRAM, URETEROSCOPY AND STENT EXCHANGE;  Surgeon: Milford Cage, MD;  Location: [facility];  Service: Urology;  Laterality: Right;  . HOLMIUM LASER APPLICATION Right 09/27/2013   Procedure: HOLMIUM LASER APPLICATION;  Surgeon: Milford Cage, MD;  Location: [facility];  Service: Urology;  Laterality: Right;  . HYSTEROSCOPY W/D&C  01-25-2003  . LAPAROSCOPIC ASSISTED VAGINAL HYSTERECTOMY  03-02-2003   W/  BILATERAL SALPINGOOPHORECTOMY  . LAPAROSCOPY  EXTENSIVE LYSIS ADHESIONS/  REDO PARAESOPHAGEAL HIATAL HERNIA WITH PRIMARY CLOSURE AND MESH/ NISSEN FUNDOPLICATION (1cm)/  REPAIR GASTROTOMY  08-22-2009  . NISSEN FUNDOPLICATION  2000   W/  CHOLECYSTECTOMY  . SHOULDER ARTHROSCOPY WITH SUBACROMIAL DECOMPRESSION, ROTATOR CUFF REPAIR AND BICEP TENDON REPAIR Left 08-02-2010   AND LABRAL DEBRIDEMENT  . TOTAL KNEE ARTHROPLASTY Right 07-01-2006  . TOTAL KNEE REVISION  02/25/2012   Procedure: TOTAL KNEE REVISION;  Surgeon: Cammy Copa, MD;  Location: [facility] OR;  Service: Orthopedics;  Laterality: Right;  Revise right total knee arthroplasty  . TUBAL LIGATION  1990  . WRIST FUSION Left 2004   RETAINED HARDWARE    No Known Allergies  Social History   Socioeconomic History  . Marital status: Widowed    Spouse name: Not on file  . Number of children: Not on file  . Years of education: Not on file  . Highest education level: Not on file  Social Needs  . Financial resource strain: Not on file  . Food insecurity - worry: Not on file  . Food insecurity - inability: Not on file  . Transportation needs - medical: Not on file  .  Transportation needs - non-medical: Not on file  Occupational History  . Not on file  Tobacco Use  . Smoking status: Never Smoker  . Smokeless tobacco: Never Used  Substance and Sexual Activity  . Alcohol use: No  . Drug use: No  . Sexual activity: Not on file  Other Topics Concern  . Not on file  Social History Narrative  . Not on file    Family History  Problem Relation Age of Onset  . Heart disease Father      PHYSICAL EXAM: Vital signs and exam to be obtained in preop holding  LABS: CBC Latest Ref Rng & Units 07/03/2017 09/26/2016 09/27/2013  WBC 4.0 - 10.5 K/uL 8.3 7.0 -  Hemoglobin 12.0 - 15.0 g/dL 40.112.7 02.712.8 25.312.2  Hematocrit 36.0 - 46.0 % 40.0 40.8 36.0  Platelets 150 - 400 K/uL 208 220 -   Lab Results  Component Value Date   CREATININE 0.86 07/03/2017    IMPRESSION: [name] is a 62 y.o. female with right nephrolithiasis requiring surgical correction.   PLAN:   - Will proceed to the OR as planned.  - NPO status to be checked by anesthesia.   - Risk of the surgery will be discussed in preop holding and informed consent will be obtained accordingly - vital signs and exam to be obtained in preop holding   Pending above actions, will proceed to OR as planned.  Callie FieldingPauline Filippou, MD Urologic Surgery Resident    Attending attestation: Patient seen and examined in preop area.  I reviewed the images from interventional and discussed with Dr. Fredia SorrowYamagata.  I discussed with the patient the nature, potential benefits, risks and alternatives to right PCNL, including side effects of the proposed treatment, the likelihood of the patient achieving the goals of the procedure, and any potential problems that might occur during the procedure or recuperation. She's been well with no fever, dysuria or gross hematuria. All questions answered. Patient elects to proceed.  I discussed with Dr. Merrily PewFilippou and agree with her history and physical and assessment and plan.

## 2017-07-08 NOTE — Transfer of Care (Signed)
Immediate Anesthesia Transfer of Care Note  Patient: Jasmine ForestLinda S Anderson  Procedure(s) Performed: NEPHROLITHOTOMY PERCUTANEOUS (Right )  Patient Location: PACU  Anesthesia Type:General  Level of Consciousness: awake, alert  and oriented  Airway & Oxygen Therapy: Patient Spontanous Breathing and Patient connected to face mask oxygen  Post-op Assessment: Report given to RN and Post -op Vital signs reviewed and stable  Post vital signs: Reviewed and stable  Last Vitals:  Vitals:   07/08/17 0721  BP: 109/82  Pulse: 68  Resp: 16  Temp: 36.9 C  SpO2: 95%    Last Pain:  Vitals:   07/08/17 0721  TempSrc: Oral         Complications: No apparent anesthesia complications

## 2017-07-09 ENCOUNTER — Observation Stay (HOSPITAL_COMMUNITY): Payer: BLUE CROSS/BLUE SHIELD

## 2017-07-09 DIAGNOSIS — N200 Calculus of kidney: Secondary | ICD-10-CM | POA: Diagnosis not present

## 2017-07-09 LAB — BASIC METABOLIC PANEL
ANION GAP: 9 (ref 5–15)
BUN: 12 mg/dL (ref 6–20)
CALCIUM: 8.5 mg/dL — AB (ref 8.9–10.3)
CO2: 27 mmol/L (ref 22–32)
CREATININE: 0.75 mg/dL (ref 0.44–1.00)
Chloride: 103 mmol/L (ref 101–111)
GFR calc Af Amer: >60 mL/min (ref >60)
GLUCOSE: 149 mg/dL — AB (ref 65–99)
Potassium: 4.1 mmol/L (ref 3.5–5.1)
Sodium: 139 mmol/L (ref 135–145)

## 2017-07-09 LAB — CBC
HEMATOCRIT: 31.8 % — AB (ref 36.0–46.0)
Hemoglobin: 10.3 g/dL — ABNORMAL LOW (ref 12.0–15.0)
MCH: 29.7 pg (ref 26.0–34.0)
MCHC: 32.4 g/dL (ref 30.0–36.0)
MCV: 91.6 fL (ref 78.0–100.0)
Platelets: 158 10*3/uL (ref 150–400)
RBC: 3.47 MIL/uL — ABNORMAL LOW (ref 3.87–5.11)
RDW: 13.8 % (ref 11.5–15.5)
WBC: 6.8 10*3/uL (ref 4.0–10.5)

## 2017-07-09 LAB — GLUCOSE, CAPILLARY
GLUCOSE-CAPILLARY: 196 mg/dL — AB (ref 65–99)
Glucose-Capillary: 152 mg/dL — ABNORMAL HIGH (ref 65–99)

## 2017-07-09 MED ORDER — OXYBUTYNIN CHLORIDE 5 MG PO TABS: 5.0000 mg | ORAL_TABLET | Freq: Three times a day (TID) | ORAL | 0 refills | Status: DC | PRN

## 2017-07-09 MED ORDER — OXYCODONE-ACETAMINOPHEN 10-325 MG PO TABS: 1.0000 | ORAL_TABLET | Freq: Four times a day (QID) | ORAL | 0 refills | Status: DC | PRN

## 2017-07-09 MED ORDER — NITROFURANTOIN MONOHYD MACRO 100 MG PO CAPS: 100.0000 mg | ORAL_CAPSULE | Freq: Every day | ORAL | 0 refills | Status: AC

## 2017-07-09 MED ORDER — ASPIRIN EC 81 MG PO TBEC: 81.0000 mg | DELAYED_RELEASE_TABLET | Freq: Every day | ORAL | Status: AC

## 2017-07-09 MED ORDER — MELOXICAM 15 MG PO TABS: 15.0000 mg | ORAL_TABLET | Freq: Every day | ORAL | Status: DC

## 2017-07-09 MED ORDER — DOCUSATE SODIUM 100 MG PO CAPS: 100.0000 mg | ORAL_CAPSULE | Freq: Two times a day (BID) | ORAL | 0 refills | Status: DC

## 2017-07-09 NOTE — Progress Notes (Signed)
Urology Progress Note   1 Day Post-Op  Subjective: POD 1 s/p R PCNL. Doing well this AM. Tolerating diet without nausea or vomiting. Pain well controlled, some right flank soreness post procedurally. VSS.   Objective: Vital signs in last 24 hours: Temp:  [97.8 F (36.6 C)-98.7 F (37.1 C)] 98.7 F (37.1 C) (02/13 0524) Pulse Rate:  [35-86] 80 (02/13 0524) Resp:  [10-19] 16 (02/13 0524) BP: (97-140)/(44-87) 105/64 (02/13 0524) SpO2:  [94 %-100 %] 94 % (02/13 0524)  Intake/Output from previous day: 02/12 0701 - 02/13 0700 In: 2605 [P.O.:240; I.V.:2315; IV Piggyback:50] Out: 2200 [Urine:2000; Blood:200] Intake/Output this shift: No intake/output data recorded.  Physical Exam:  General: Alert and oriented CV: RRR Lungs: Clear Abdomen: Soft, GU: Foley with clear yellow urine in tubing. Nephrostomy tube to drainage, pink tinged thin urine. No flank ecchymosis or swelling.   Ext: NT, No erythema  Lab Results: Recent Labs    07/09/17 0401  HGB 10.3*  HCT 31.8*   BMET Recent Labs    07/09/17 0401  NA 139  K 4.1  CL 103  CO2 27  GLUCOSE 149*  BUN 12  CREATININE 0.75  CALCIUM 8.5*     Studies/Results: Ct Abdomen Wo Contrast  Result Date: 07/09/2017 CLINICAL DATA:  Status post percutaneous nephrostolithotomy with tube placement 07/08/2017. EXAM: CT ABDOMEN WITHOUT CONTRAST TECHNIQUE: Multidetector CT imaging of the abdomen was performed following the standard protocol without IV contrast. COMPARISON:  06/05/2017 FINDINGS: Lower chest: Subsegmental atelectasis at the left lung base. No pleural fluid. Hepatobiliary: Superior aspect of the liver and stomach excluded. Mild hepatic steatosis. Cholecystectomy, without biliary ductal dilatation. Pancreas: Normal, without mass or ductal dilatation. Spleen: Normal in size, without focal abnormality. Adrenals/Urinary Tract: Normal adrenal glands. Punctate left renal collecting system calculi. Hyperattenuation in the left renal  pelvis is likely due to retained contrast. Placement of a right-sided nephrostomy with safety catheter. Right perinephric edema is moderate and presumably secondary. 2-3 mm right renal collecting system calculi remain. The dominant right renal pelvic stone has been removed. Tiny stone fragments are suspected along the anterior aspect of the ureteric catheter. Example sagittal image 64. No hydronephrosis. Stomach/Bowel: Prior Nissen fundoplication. Colonic stool burden suggests constipation. Normal abdominal small bowel. Vascular/Lymphatic: Normal caliber of the aorta and branch vessels. No retroperitoneal or retrocrural adenopathy. Other: No ascites. Musculoskeletal: No acute osseous abnormality. Moderate thoracic spondylosis. IMPRESSION: 1. placement of right nephrostomy catheter with removal of dominant right renal pelvic stone. Suspect tiny stone fragments along the course of the right ureteric catheter. 2. Bilateral renal collecting system calculi, without hydronephrosis. 3.  Possible constipation. 4. Hepatic steatosis. Electronically Signed   By: Jeronimo Greaves M.D.   On: 07/09/2017 08:30   Dg C-arm 1-60 Min-no Report  Result Date: 07/08/2017 Fluoroscopy was utilized by the requesting physician.  No radiographic interpretation.   Ir Ureteral Stent Right New Access W/o Sep Nephrostomy Cath  Result Date: 07/08/2017 INDICATION: Large calculus occupying the right renal pelvis. Percutaneous ureteral catheter access required prior to percutaneous nephrolithotomy. EXAM: PLACEMENT OF PERCUTANEOUS ANTEGRADE RIGHT URETERAL CATHETER COMPARISON:  CT of the abdomen and pelvis on 06/05/2017 MEDICATIONS: Ancef 2 gm IV; The antibiotic was administered in an appropriate time frame prior to skin puncture. ANESTHESIA/SEDATION: Fentanyl 100 mcg IV; Versed 3.0 mg IV Moderate Sedation Time:  31 minutes. The patient was continuously monitored during the procedure by the interventional radiology nurse under my direct  supervision. CONTRAST:  20 mL Isovue-300-administered into the collecting system(s) 75  mL Isovue 370 IV FLUOROSCOPY TIME:  Fluoroscopy Time: 4 minutes and 12 seconds. 143 mGy. COMPLICATIONS: None immediate. PROCEDURE: Informed written consent was obtained from the patient after a thorough discussion of the procedural risks, benefits and alternatives. All questions were addressed. Maximal Sterile Barrier Technique was utilized including caps, mask, sterile gowns, sterile gloves, sterile drape, hand hygiene and skin antiseptic. A timeout was performed prior to the initiation of the procedure. Initial attempt at access of the renal collecting system was performed under ultrasound guidance with passage of a 21 gauge needle under ultrasound guidance at the level of the upper pole and interpolar kidney. The patient was given intravenous contrast in order to opacify the collecting system. Under fluoroscopy, a lower pole calyx was punctured with a 21 gauge needle. After aspiration of urine, contrast was injected. A guidewire was advanced into the collecting system. A transitional dilator was placed. Over a guidewire, a 5 French catheter was advanced into the collecting system. This was directed into the ureter. The catheter was advanced over a guidewire into the bladder. The catheter was capped and secured at the skin with a silk retention suture. Final catheter course was confirmed by fluoroscopy. FINDINGS: Initial fluoroscopy demonstrates a large calculus in the central aspect of the right kidney. By ultrasound, the collecting system is decompressed and could not be accessed for injection of contrast by direct ultrasound guided puncture of the kidney. After administration of intravenous contrast, the collecting system became opacified allowing access of a lower pole calyx under fluoroscopy. Guidewire access was able to be accomplished to the level of the renal pelvis. The renal calculus occupies almost the entire region  of the renal pelvis. A catheter was then able to be advanced down the ureter and into the bladder to secure access for percutaneous nephrolithotomy. IMPRESSION: Placement of right-sided percutaneous ureteral catheter after access of a lower pole calyx. A 5 French catheter was able to be advanced past a large pelvic calculus and into the bladder. This access will be utilized for percutaneous nephrolithotomy to follow with Dr. Mena GoesEskridge. Electronically Signed   By: Irish LackGlenn  Yamagata M.D.   On: 07/08/2017 12:45    Assessment/Plan:  62 y.o. female POD 1 s/p R PCNL w/ JJ ureteral stent, nephrosotmy tube, foley. Progressing well. Suspected to be stone free on R side.   - cap nephrostomy tube - remove foley, TOV - regular diet, med lock - continue cefazolin in house - if does well with nephrostomy tube capped, will d/c tube and patient in PM   Dispo: likely home today   LOS: 0 days   Katherine Tout L 07/09/2017, 8:44 AM

## 2017-07-09 NOTE — Discharge Summary (Signed)
[facility] Discharge Summary  Admit date: 07/08/2017  Discharge date and time: 07/09/17   Discharge to: Home  Discharge Service: Urology  Discharge Attending Physician:  Dr. Jerilee FieldMatthew Eskridge  Discharge  Diagnoses: Nephrolithiasis  Secondary Diagnosis: Active Problems:   Nephrolithiasis   OR Procedures: Procedure(s): NEPHROLITHOTOMY PERCUTANEOUS 07/08/2017   Ancillary Procedures: None   Discharge Day Services: The patient was seen and examined by the Urology team both in the morning and immediately prior to discharge.  Vital signs and laboratory values were stable and within normal limits.  The physical exam was benign and unchanged and all surgical wounds were examined.  Discharge instructions were explained and all questions answered.  Subjective  No acute events overnight. Pain Controlled. No fever or chills.  Objective Patient Vitals for the past 8 hrs:  BP Temp Temp src Resp SpO2  07/09/17 1045 (!) 142/60 97.7 F (36.5 C) Oral 16 92 %   Total I/O In: 340 [P.O.:240; IV Piggyback:100] Out: 650 [Urine:650]  General Appearance:        No acute distress Lungs:                 Normal work of breathing on room air Heart:                             Regular rate and rhythm Abdomen:                       Soft, non-tender, non-distended.  Flank:    Right flank with PCNL incision clean. No erythema. Thin serosanguinous fluid with minor leakage.  Extremities:                  Warm and well perfused    Hospital Course:  The patient underwent R PCNL with foley catheter, nephrostomy tube placement and R JJ ureteral stent placement on 07/08/2017.  The patient tolerated the procedure well, was extubated in the OR, and afterwards was taken to the PACU for routine post-surgical care. When stable the patient was transferred to the floor.   The patient did well postoperatively.  The patient's diet was slowly advanced and at the time of discharge was tolerating a regular diet. Foley  catheter was removed on POD1, and patient was able to void spontaneously. R nephrostomy tube was capped in AM, and after patient tolerated capping trial, this was removed in PM. CT abdomen was done on AM of POD1 which showed no flank hematoma, small residual stone fragment in R kidney and known L non obstructing kidney stone. The patient was discharged home 1 Day Post-Op, at which point was tolerating a regular solid diet, was able to void spontaneously, have adequate pain control with P.O. pain medication, and could ambulate without difficulty. The patient will follow up with us for post op check and to discussed future USE for stone clearance.    Condition at Discharge: Improved  Discharge Medications:  Allergies as of 07/09/2017   No Known Allergies     Medication List    TAKE these medications   albuterol (2.5 MG/3ML) 0.083% nebulizer solution Commonly known as:  PROVENTIL Take 2.5 mg by nebulization every 6 (six) hours as needed for wheezing or shortness of breath.   albuterol 108 (90 Base) MCG/ACT inhaler Commonly known as:  PROVENTIL HFA;VENTOLIN HFA Inhale 2 puffs into the lungs every 4 (four) hours as needed for wheezing or shortness of breath. For shortness  of breath   aspirin EC 81 MG tablet Take 1 tablet (81 mg total) by mouth daily. Start taking on:  07/14/2017 What changed:  These instructions start on 07/14/2017. If you are unsure what to do until then, ask your doctor or other care provider.   baclofen 10 MG tablet Commonly known as:  LIORESAL Take 5 mg by mouth 3 (three) times daily as needed for muscle spasms.   docusate sodium 100 MG capsule Commonly known as:  COLACE Take 1 capsule (100 mg total) by mouth 2 (two) times daily.   gabapentin 400 MG capsule Commonly known as:  NEURONTIN Take 800 mg by mouth 4 (four) times daily.   glipiZIDE 10 MG tablet Commonly known as:  GLUCOTROL TAKE 1 TABLET BY MOUTH TWICE DAILY BEFORE MEAL(S)   meloxicam 15 MG  tablet Commonly known as:  MOBIC Take 1 tablet (15 mg total) by mouth daily. Start taking on:  07/14/2017 What changed:  These instructions start on 07/14/2017. If you are unsure what to do until then, ask your doctor or other care provider.   metFORMIN 1000 MG tablet Commonly known as:  GLUCOPHAGE TAKE 1 TABLET BY MOUTH TWICE DAILY WITH A MEAL   morphine 30 MG 12 hr tablet Commonly known as:  MS CONTIN Take 30 mg by mouth 2 (two) times daily.   nitrofurantoin (macrocrystal-monohydrate) 100 MG capsule Commonly known as:  MACROBID Take 1 capsule (100 mg total) by mouth at bedtime for 21 days.   oxybutynin 5 MG tablet Commonly known as:  DITROPAN Take 1 tablet (5 mg total) by mouth every 8 (eight) hours as needed for bladder spasms.   oxyCODONE-acetaminophen 10-325 MG tablet Commonly known as:  PERCOCET Take 1 tablet by mouth 4 (four) times daily as needed. For breakthrough pain.   tamsulosin 0.4 MG Caps capsule Commonly known as:  FLOMAX Take 1 capsule (0.4 mg total) by mouth daily.   tiZANidine 2 MG tablet Commonly known as:  ZANAFLEX Take 2 mg by mouth 3 (three) times daily as needed for spasms.   zolpidem 5 MG tablet Commonly known as:  AMBIEN Take 5 mg by mouth at bedtime.       Pending Test Results: None  Discharge Instructions:  Discharge Instructions    Call MD for:  persistant nausea and vomiting   Complete by:  As directed    Call MD for:  redness, tenderness, or signs of infection (pain, swelling, redness, odor or green/yellow discharge around incision site)   Complete by:  As directed    Call MD for:  severe uncontrolled pain   Complete by:  As directed    Call MD for:  temperature >100.4   Complete by:  As directed    Diet general   Complete by:  As directed    Discharge instructions   Complete by:  As directed    You may see some blood in the urine and may have some burning with urination for 48-72 hours. You also may notice that you have to urinate  more frequently or urgently after your procedure which is normal.  You should call should you develop an inability urinate, fever > 101, persistent nausea and vomiting that prevents you from eating or drinking to stay hydrated.  If you have a stent, you will likely urinate more frequently and urgently until the stent is removed and you may experience some discomfort/pain in the lower abdomen and flank especially when urinating. You may take pain medication prescribed  to you if needed for pain. You may also intermittently have blood in the urine until the stent is removed.  The hole in your back will leak for several days a clear blood tinged fluid. If this becomes pure blood or green like pus, please call the office. You may shower starting tomorrow, but you may not bathe or submerge the incision in a bath until it is completely clear. If you have a fever, or notice the above from your incision should call the office [phone]) to notify us.   Discharge instructions   Complete by:  As directed    [facility] will call you with a follow up appointment for stent removal and to discuss your next surgery. Please call the office if you have not heard from [place] in 2-3 days.   Driving Restrictions   Complete by:  As directed    No driving while taking narcotic medicine   Increase activity slowly   Complete by:  As directed    Lifting restrictions   Complete by:  As directed    No heavy lifting greater than 10 pounds for 2 weeks.

## 2017-07-21 ENCOUNTER — Ambulatory Visit: Payer: Self-pay | Admitting: Cardiology

## 2017-09-23 ENCOUNTER — Other Ambulatory Visit: Payer: Self-pay | Admitting: Physical Medicine and Rehabilitation

## 2017-09-23 DIAGNOSIS — M5416 Radiculopathy, lumbar region: Secondary | ICD-10-CM

## 2017-09-25 ENCOUNTER — Ambulatory Visit: Payer: Self-pay | Admitting: *Deleted

## 2017-09-25 NOTE — Telephone Encounter (Signed)
Pt called with pain in her left leg from her hip down to her foot. She states the pain is burning, numbing and tingling. She also states some pain in her back. She has not injured herself. She can walk but more like hobbling. She denies fever, or shortness of breath. She has taken pain meds (Percocet, MS contin)  that she has at home but nothing is touching it. Appointment made for tomorrow. Advised that if she feels like the symptoms are worse, to please call back. Pt voiced understanding. Will notify flow at [facility] at  [facility].  Reason for Disposition . Numbness in a leg or foot (i.e., loss of sensation)  Answer Assessment - Initial Assessment Questions 1. ONSET: "When did the pain start?"     Yesterday  2. LOCATION: "Where is the pain located?"      Left hip 3. PAIN: "How bad is the pain?"    (Scale 1-10; or mild, moderate, severe)   -  MILD (1-3): doesn't interfere with normal activities    -  MODERATE (4-7): interferes with normal activities (e.g., work or school) or awakens from sleep, limping    -  SEVERE (8-10): excruciating pain, unable to do any normal activities, unable to walk     Pain # 10 4. WORK OR EXERCISE: "Has there been any recent work or exercise that involved this part of the body?"      no 5. CAUSE: "What do you think is causing the leg pain?"     Not sure 6. OTHER SYMPTOMS: "Do you have any other symptoms?" (e.g., chest pain, back pain, breathing difficulty, swelling, rash, fever, numbness, weakness)     Back pain, swelling, numbness 7. PREGNANCY: "Is there any chance you are pregnant?" "When was your last menstrual period?"     no  Protocols used: LEG PAIN-A-AH

## 2017-09-25 NOTE — Telephone Encounter (Signed)
Noted  

## 2017-09-25 NOTE — Telephone Encounter (Signed)
I spoke with pt and advised if the Percocet and Morphine is not helping the pain she could go to ED today rather than waiting on appt tomorrow. Pt said she did not want to go to ED and will Keep appt on 09/26/17 at 10:15 with Harlin Heys FNP. Pt did agree if condition worsens prior to appt she will go to ED.FYI to Harlin Heys FNP and Dr Ermalene Searing who is in the office today.

## 2017-09-26 ENCOUNTER — Ambulatory Visit (INDEPENDENT_AMBULATORY_CARE_PROVIDER_SITE_OTHER): Payer: BLUE CROSS/BLUE SHIELD | Admitting: Family Medicine

## 2017-09-26 ENCOUNTER — Encounter: Payer: Self-pay | Admitting: Family Medicine

## 2017-09-26 VITALS — BP 136/84 | HR 85 | Temp 98.0°F | Ht 63.0 in | Wt 185.2 lb

## 2017-09-26 DIAGNOSIS — E119 Type 2 diabetes mellitus without complications: Secondary | ICD-10-CM

## 2017-09-26 DIAGNOSIS — D649 Anemia, unspecified: Secondary | ICD-10-CM

## 2017-09-26 DIAGNOSIS — M5442 Lumbago with sciatica, left side: Secondary | ICD-10-CM

## 2017-09-26 LAB — CBC WITH DIFFERENTIAL/PLATELET
Basophils Absolute: 0 10*3/uL (ref 0.0–0.1)
Basophils Relative: 0.3 % (ref 0.0–3.0)
Eosinophils Absolute: 0.2 10*3/uL (ref 0.0–0.7)
Eosinophils Relative: 3 % (ref 0.0–5.0)
HCT: 37.3 % (ref 36.0–46.0)
Hemoglobin: 12.1 g/dL (ref 12.0–15.0)
Lymphocytes Relative: 36.3 % (ref 12.0–46.0)
Lymphs Abs: 2.5 10*3/uL (ref 0.7–4.0)
MCHC: 32.5 g/dL (ref 30.0–36.0)
MCV: 89.9 fl (ref 78.0–100.0)
Monocytes Absolute: 0.5 10*3/uL (ref 0.1–1.0)
Monocytes Relative: 6.6 % (ref 3.0–12.0)
Neutro Abs: 3.7 10*3/uL (ref 1.4–7.7)
Neutrophils Relative %: 53.8 % (ref 43.0–77.0)
Platelets: 215 10*3/uL (ref 150.0–400.0)
RBC: 4.15 Mil/uL (ref 3.87–5.11)
RDW: 14.4 % (ref 11.5–15.5)
WBC: 6.9 10*3/uL (ref 4.0–10.5)

## 2017-09-26 LAB — HEMOGLOBIN A1C: Hgb A1c MFr Bld: 8.5 % — ABNORMAL HIGH (ref 4.6–6.5)

## 2017-09-26 MED ORDER — PREDNISONE 10 MG PO TABS: ORAL_TABLET | ORAL | 0 refills | Status: DC

## 2017-09-26 MED ORDER — BLOOD GLUCOSE METER KIT: PACK | 0 refills | Status: AC

## 2017-09-26 NOTE — Progress Notes (Signed)
Subjective:    Patient ID: [name], female    DOB: Jun 04, 1955, 62 y.o.   MRN: [id number]  HPI This is a 62 yo female who presents today with left leg pain x 4 days. Started with left inner thigh numbness but has increased to pain from left side of back to foot with burning,tingling/numbness/pain, no bowel/bladder incontinence. Never had pain like this before. No recent falls, feels weak in leg. Was sitting on the floor at work the other day and had an episode of brief syncope due to pain.  Sitting provides some improvement but not resolution. Sleeps on left side and has pain.  Pain is a 10 at worse. No improvement with heat or ice. Was seen by pain management doctor a couple of weeks ago and called her about new pain. Dr. Greta Doom has ordered an MRI but it hasn't been ordered.  Takes percocet five times a day and MS contin twice a day with no relief. Takes meloxicam. Blood sugar has been running high at home, 180- 220s. Reports compliance with medication. Needs new meter. Was using her SIL, out of supplies.   Past Medical History:  Diagnosis Date  . Arthritis   . Asthma   . History of GI bleed    2010--- UPPER ESOPHAGITIS/ DUODERAL ULCER/ EROSION  . History of hiatal hernia   . History of kidney stones   . History of shingles    2013  . Myocardial infarction ([place]) 10 years ago   . Nephrolithiasis    BILATERAL  . Right ureteral stone   . Type 2 diabetes, diet controlled ([place])   . Urgency of urination    Past Surgical History:  Procedure Laterality Date  . CARDIAC CATHETERIZATION  02-10-2009  DR BENSIMHON   NORMAL CORONARIES/  LOW NORMAL LVF WITHOUT WALL MOTION ABNORMALITIES  . CARDIAC CATHETERIZATION  11-21-2010  DR Einar Gip   NORMAL CORONARIES/  EF 50-55%  . CHOLECYSTECTOMY  1990s   back in [place]   . CYSTOSCOPY W/ URETERAL STENT PLACEMENT Right 09/04/2013   Procedure: CYSTOSCOPY WITH RETROGRADE PYELOGRAM/URETERAL STENT PLACEMENT;  Surgeon: Molli Hazard, MD;  Location:  [facility];  Service: Urology;  Laterality: Right;  . CYSTOSCOPY WITH RETROGRADE PYELOGRAM, URETEROSCOPY AND STENT PLACEMENT Right 09/27/2013   Procedure: CYSTOSCOPY WITH RETROGRADE PYELOGRAM, URETEROSCOPY AND STENT EXCHANGE;  Surgeon: Molli Hazard, MD;  Location: [facility];  Service: Urology;  Laterality: Right;  . HOLMIUM LASER APPLICATION Right 12/27/9935   Procedure: HOLMIUM LASER APPLICATION;  Surgeon: Molli Hazard, MD;  Location: [facility];  Service: Urology;  Laterality: Right;  . HYSTEROSCOPY W/D&C  01-25-2003  . IR URETERAL STENT RIGHT NEW ACCESS W/O SEP NEPHROSTOMY CATH  07/08/2017  . LAPAROSCOPIC ASSISTED VAGINAL HYSTERECTOMY  03-02-2003   W/  BILATERAL SALPINGOOPHORECTOMY  . LAPAROSCOPY  EXTENSIVE LYSIS ADHESIONS/  REDO PARAESOPHAGEAL HIATAL HERNIA WITH PRIMARY CLOSURE AND MESH/ NISSEN FUNDOPLICATION (1cm)/  REPAIR GASTROTOMY  08-22-2009  . NEPHROLITHOTOMY Right 07/08/2017   Procedure: NEPHROLITHOTOMY PERCUTANEOUS;  Surgeon: Festus Aloe, MD;  Location: [facility];  Service: Urology;  Laterality: Right;  . NISSEN FUNDOPLICATION  1696   W/  CHOLECYSTECTOMY  . SHOULDER ARTHROSCOPY WITH SUBACROMIAL DECOMPRESSION, ROTATOR CUFF REPAIR AND BICEP TENDON REPAIR Left 08-02-2010   AND LABRAL DEBRIDEMENT  . TOTAL KNEE ARTHROPLASTY Right 07-01-2006  . TOTAL KNEE REVISION  02/25/2012   Procedure: TOTAL KNEE REVISION;  Surgeon: Meredith Pel, MD;  Location: [facility];  Service: Orthopedics;  Laterality:  Right;  Revise right total knee arthroplasty  . TUBAL LIGATION  1990  . WRIST FUSION Left 2004   RETAINED HARDWARE   Family History  Problem Relation Age of Onset  . Heart disease Father    Social History   Tobacco Use  . Smoking status: Never Smoker  . Smokeless tobacco: Never Used  Substance Use Topics  . Alcohol use: No  . Drug use: No      Review of Systems Per HPI    Objective:   Physical Exam  Constitutional: She is oriented to  person, place, and time. She appears well-developed and well-nourished. No distress.  Obeses.   HENT:  Head: Normocephalic and atraumatic.  Eyes: Pupils are equal, round, and reactive to light. Conjunctivae and EOM are normal.  Neck: Normal range of motion. Neck supple.  Cardiovascular: Normal rate and regular rhythm.  Pulmonary/Chest: Effort normal and breath sounds normal.  Musculoskeletal:       Cervical back: She exhibits normal range of motion and no tenderness.       Thoracic back: She exhibits normal range of motion and no tenderness.       Lumbar back: She exhibits decreased range of motion, tenderness and bony tenderness. She exhibits no swelling, no edema and no deformity.  Low back tenderness L>R, spinous process and paraspinal. Legs with full ROM, pain with straight leg raise.   Neurological: She is alert and oriented to person, place, and time. She displays normal reflexes. Coordination normal.  Skin: Skin is warm and dry. She is not diaphoretic.  Psychiatric: She has a normal mood and affect. Her behavior is normal. Judgment and thought content normal.  Vitals reviewed.     BP 136/84 (BP Location: Right Arm, Patient Position: Sitting, Cuff Size: Normal)   Pulse 85   Temp 98 F (36.7 C) (Oral)   Ht _0  (1.6 m)   Wt 185 lb 4 oz (84 kg)   SpO2 95%   BMI 32.82 kg/m  Wt Readings from Last 3 Encounters:  09/26/17 185 lb 4 oz (84 kg)  07/08/17 179 lb (81.2 kg)  07/03/17 181 lb (82.1 kg)       Assessment & Plan:  1. Acute left-sided low back pain with left-sided sciatica - she is currently on multiple medications for pain/inflammation without relief. Will provide short course of prednisone - needs imaging which is ordered- she will follow up on scheduling with Dr. Greta Doom - discussed increase in blood sugars and encouraged her to very carefull watch her diet while on prednisone - RTC/ER precautions reveiwed - predniSONE (DELTASONE) 10 MG tablet; Take 6 x 1 day, 5x1,  4x1, 3x1, 2x1, 1x1.Do not take meloxicam within 24 hours of taking prednisone.  Dispense: 21 tablet; Refill: 0  2. Type 2 diabetes mellitus without complication, without long-term current use of insulin (HCC) - history of non compliance with follow up, discussed importance of follow up with PCP - blood glucose meter kit and supplies; Dispense based on patient and insurance preference. Use up to four times daily as directed. (FOR ICD-10 E10.9, E11.9).  Dispense: 1 each; Refill: 0 - Hemoglobin A1c - follow up with PCP in 4-6 weeks- instructed her to bring blood sugar log to visit  3. Anemia, unspecified type - anemia noted during hospitalization for nephrolithiasis/stent placement - CBC with Differential   Clarene Reamer, FNP-BC  [facility], [place]  09/26/2017 1:26 PM

## 2017-09-26 NOTE — Patient Instructions (Signed)
Good to see you today  I have sent in prednisone to your pharmacy, do take with Meloxicam  Find out when your MRI will be done  Watch your diet very carefully while on prednisone- it will raise your blood sugars   Sciatica Sciatica is pain, numbness, weakness, or tingling along your sciatic nerve. The sciatic nerve starts in the lower back and goes down the back of each leg. Sciatica happens when this nerve is pinched or has pressure put on it. Sciatica usually goes away on its own or with treatment. Sometimes, sciatica may keep coming back (recur). Follow these instructions at home: Medicines  Take over-the-counter and prescription medicines only as told by your doctor.  Do not drive or use heavy machinery while taking prescription pain medicine. Managing pain  If directed, put ice on the affected area. ? Put ice in a plastic bag. ? Place a towel between your skin and the bag. ? Leave the ice on for 20 minutes, 2-3 times a day.  After icing, apply heat to the affected area before you exercise or as often as told by your doctor. Use the heat source that your doctor tells you to use, such as a moist heat pack or a heating pad. ? Place a towel between your skin and the heat source. ? Leave the heat on for 20-30 minutes. ? Remove the heat if your skin turns bright red. This is especially important if you are unable to feel pain, heat, or cold. You may have a greater risk of getting burned. Activity  Return to your normal activities as told by your doctor. Ask your doctor what activities are safe for you. ? Avoid activities that make your sciatica worse.  Take short rests during the day. Rest in a lying or standing position. This is usually better than sitting to rest. ? When you rest for a long time, do some physical activity or stretching between periods of rest. ? Avoid sitting for a long time without moving. Get up and move around at least one time each hour.  Exercise and  stretch regularly, as told by your doctor.  Do not lift anything that is heavier than 10 lb (4.5 kg) while you have symptoms of sciatica. ? Avoid lifting heavy things even when you do not have symptoms. ? Avoid lifting heavy things over and over.  When you lift objects, always lift in a way that is safe for your body. To do this, you should: ? Bend your knees. ? Keep the object close to your body. ? Avoid twisting. General instructions  Use good posture. ? Avoid leaning forward when you are sitting. ? Avoid hunching over when you are standing.  Stay at a healthy weight.  Wear comfortable shoes that support your feet. Avoid wearing high heels.  Avoid sleeping on a mattress that is too soft or too hard. You might have less pain if you sleep on a mattress that is firm enough to support your back.  Keep all follow-up visits as told by your doctor. This is important. Contact a doctor if:  You have pain that: ? Wakes you up when you are sleeping. ? Gets worse when you lie down. ? Is worse than the pain you have had in the past. ? Lasts longer than 4 weeks.  You lose weight for without trying. Get help right away if:  You cannot control when you pee (urinate) or poop (have a bowel movement).  You have weakness in any  of these areas and it gets worse. ? Lower back. ? Lower belly (pelvis). ? Butt (buttocks). ? Legs.  You have redness or swelling of your back.  You have a burning feeling when you pee. This information is not intended to replace advice given to you by your health care provider. Make sure you discuss any questions you have with your health care provider. Document Released: 02/20/2008 Document Revised: 10/19/2015 Document Reviewed: 01/20/2015 Elsevier Interactive Patient Education  [employer].

## 2017-10-01 ENCOUNTER — Ambulatory Visit
Admission: RE | Admit: 2017-10-01 | Discharge: 2017-10-01 | Disposition: A | Discharge status: Home / Self Care | Payer: BLUE CROSS/BLUE SHIELD | Source: Ambulatory Visit | Attending: Physical Medicine and Rehabilitation | Admitting: Physical Medicine and Rehabilitation

## 2017-10-01 DIAGNOSIS — M5416 Radiculopathy, lumbar region: Secondary | ICD-10-CM

## 2017-11-07 ENCOUNTER — Ambulatory Visit: Payer: Self-pay | Admitting: *Deleted

## 2017-11-07 ENCOUNTER — Encounter (HOSPITAL_COMMUNITY): Payer: Self-pay | Admitting: Emergency Medicine

## 2017-11-07 ENCOUNTER — Emergency Department (HOSPITAL_COMMUNITY): Payer: BLUE CROSS/BLUE SHIELD

## 2017-11-07 ENCOUNTER — Emergency Department (HOSPITAL_COMMUNITY)
Admission: EM | Admit: 2017-11-07 | Discharge: 2017-11-08 | Disposition: A | Discharge status: Home / Self Care | Payer: BLUE CROSS/BLUE SHIELD | Attending: Emergency Medicine | Admitting: Emergency Medicine

## 2017-11-07 ENCOUNTER — Other Ambulatory Visit: Payer: Self-pay

## 2017-11-07 DIAGNOSIS — J45909 Unspecified asthma, uncomplicated: Secondary | ICD-10-CM | POA: Diagnosis not present

## 2017-11-07 DIAGNOSIS — Z79899 Other long term (current) drug therapy: Secondary | ICD-10-CM | POA: Insufficient documentation

## 2017-11-07 DIAGNOSIS — R51 Headache: Secondary | ICD-10-CM | POA: Insufficient documentation

## 2017-11-07 DIAGNOSIS — Z7982 Long term (current) use of aspirin: Secondary | ICD-10-CM | POA: Diagnosis not present

## 2017-11-07 DIAGNOSIS — E119 Type 2 diabetes mellitus without complications: Secondary | ICD-10-CM | POA: Insufficient documentation

## 2017-11-07 DIAGNOSIS — I252 Old myocardial infarction: Secondary | ICD-10-CM | POA: Insufficient documentation

## 2017-11-07 DIAGNOSIS — R260 Ataxic gait: Secondary | ICD-10-CM | POA: Diagnosis not present

## 2017-11-07 DIAGNOSIS — Z96651 Presence of right artificial knee joint: Secondary | ICD-10-CM | POA: Insufficient documentation

## 2017-11-07 DIAGNOSIS — H538 Other visual disturbances: Secondary | ICD-10-CM | POA: Diagnosis not present

## 2017-11-07 DIAGNOSIS — R42 Dizziness and giddiness: Secondary | ICD-10-CM | POA: Diagnosis present

## 2017-11-07 DIAGNOSIS — Z7984 Long term (current) use of oral hypoglycemic drugs: Secondary | ICD-10-CM | POA: Insufficient documentation

## 2017-11-07 LAB — DIFFERENTIAL
ABS IMMATURE GRANULOCYTES: 0 10*3/uL (ref 0.0–0.1)
Basophils Absolute: 0 10*3/uL (ref 0.0–0.1)
Basophils Relative: 0 %
EOS ABS: 0.2 10*3/uL (ref 0.0–0.7)
Eosinophils Relative: 3 %
Immature Granulocytes: 0 %
LYMPHS ABS: 2.8 10*3/uL (ref 0.7–4.0)
LYMPHS PCT: 41 %
MONOS PCT: 7 %
Monocytes Absolute: 0.5 10*3/uL (ref 0.1–1.0)
Neutro Abs: 3.4 10*3/uL (ref 1.7–7.7)
Neutrophils Relative %: 49 %

## 2017-11-07 LAB — I-STAT CHEM 8, ED
BUN: 25 mg/dL — AB (ref 6–20)
CALCIUM ION: 1.23 mmol/L (ref 1.15–1.40)
Chloride: 98 mmol/L — ABNORMAL LOW (ref 101–111)
Creatinine, Ser: 0.8 mg/dL (ref 0.44–1.00)
GLUCOSE: 211 mg/dL — AB (ref 65–99)
HCT: 39 % (ref 36.0–46.0)
HEMOGLOBIN: 13.3 g/dL (ref 12.0–15.0)
Potassium: 3.9 mmol/L (ref 3.5–5.1)
Sodium: 140 mmol/L (ref 135–145)
TCO2: 29 mmol/L (ref 22–32)

## 2017-11-07 LAB — COMPREHENSIVE METABOLIC PANEL
ALBUMIN: 3.9 g/dL (ref 3.5–5.0)
ALK PHOS: 101 U/L (ref 38–126)
ALT: 23 U/L (ref 14–54)
AST: 21 U/L (ref 15–41)
Anion gap: 11 (ref 5–15)
BILIRUBIN TOTAL: 0.5 mg/dL (ref 0.3–1.2)
BUN: 22 mg/dL — ABNORMAL HIGH (ref 6–20)
CALCIUM: 9.8 mg/dL (ref 8.9–10.3)
CO2: 30 mmol/L (ref 22–32)
CREATININE: 0.89 mg/dL (ref 0.44–1.00)
Chloride: 100 mmol/L — ABNORMAL LOW (ref 101–111)
GFR calc Af Amer: >60 mL/min (ref >60)
GLUCOSE: 216 mg/dL — AB (ref 65–99)
POTASSIUM: 4 mmol/L (ref 3.5–5.1)
Sodium: 141 mmol/L (ref 135–145)
Total Protein: 6.8 g/dL (ref 6.5–8.1)

## 2017-11-07 LAB — I-STAT BETA HCG BLOOD, ED (MC, WL, AP ONLY): I-stat hCG, quantitative: <5 m[IU]/mL (ref <5)

## 2017-11-07 LAB — I-STAT TROPONIN, ED: TROPONIN I, POC: 0.03 ng/mL (ref 0.00–0.08)

## 2017-11-07 LAB — CBC
HEMATOCRIT: 39.4 % (ref 36.0–46.0)
Hemoglobin: 11.9 g/dL — ABNORMAL LOW (ref 12.0–15.0)
MCH: 28.7 pg (ref 26.0–34.0)
MCHC: 30.2 g/dL (ref 30.0–36.0)
MCV: 94.9 fL (ref 78.0–100.0)
Platelets: 203 10*3/uL (ref 150–400)
RBC: 4.15 MIL/uL (ref 3.87–5.11)
RDW: 13.7 % (ref 11.5–15.5)
WBC: 6.9 10*3/uL (ref 4.0–10.5)

## 2017-11-07 LAB — APTT: aPTT: 25 seconds (ref 24–36)

## 2017-11-07 LAB — PROTIME-INR
INR: 0.96
Prothrombin Time: 12.7 seconds (ref 11.4–15.2)

## 2017-11-07 NOTE — Telephone Encounter (Signed)
ER appropriate 

## 2017-11-07 NOTE — Discharge Instructions (Addendum)
Work-up here tonight including MRI brain without any evidence of a stroke.  Also consultation with neurology felt that symptoms were not consistent with mini stroke..  Stable for discharge home return for any new or worse symptoms.  Make an appointment to follow-up with your doctor.  Work note provided.

## 2017-11-07 NOTE — ED Triage Notes (Signed)
Patient to ED for stroke-like symptoms since at least 12pm yesterday - noticed headache, blurred vision, and dizziness as she was getting ready for work. No facial droop, no speech changes, no extremity weakness or numbness/tingling. Patient A&O x 4.

## 2017-11-07 NOTE — Telephone Encounter (Signed)
Pt's daughter calling, patient present during call. Reports pt left work yesterday, severe dizziness, visual changes "Couldn't see" but reports drove herself home; "Took 2 hours, we live 15 minutes away." Daughter states pt."Collapsed at door." Pt seen 09/26/17 acute back pain, sciatica. States had steroid injection, being seen by pain management per daughters report. Pt states presently mild dizziness, vision blurred. Daughter states pt non-compliant with DM medications. Did check BS this AM ; 175. Denies headache presently, did have headache yesterday. Pt states she took her pain medications yesterday; Percocet, MS Contin, Mobic zanaflex. States takes baclofen at HS. Daughter also verbalizing concern re: Pt's FMLA and asking to proceed with disability process. Pt directed to ED with acute issues. Pt and daughter declining. NT called practice, spoke with Rena who reiterated ED disposition. Pt made aware, states will go to ED. Instructed to have daughter drive. Reason for Disposition . SEVERE dizziness (e.g., unable to stand, requires support to walk, feels like passing out now)    Spoke to [place] at practice, reiterated. ED disposition  Answer Assessment - Initial Assessment Questions 1. DESCRIPTION: "Describe your dizziness."     Lightheadedness 2. LIGHTHEADED: "Do you feel lightheaded?" (e.g., somewhat faint, woozy, weak upon standing)     yes 3. VERTIGO: "Do you feel like either you or the room is spinning or tilting?" (i.e. vertigo)    no 4. SEVERITY: "How bad is it?"  "Do you feel like you are going to faint?" "Can you stand and walk?"   - MILD - walking normally   - MODERATE - interferes with normal activities (e.g., work, school)    - SEVERE - unable to stand, requires support to walk, feels like passing out now.     Severe yesterday, mild this am. "Collapsed" 5. ONSET:  "When did the dizziness begin?"     Yesterday 6. AGGRAVATING FACTORS: "Does anything make it worse?" (e.g., standing, change  in head position)    no 7. HEART RATE: "Can you tell me your heart rate?" "How many beats in 15 seconds?"  (Note: not all patients can do this)       no 8. CAUSE: "What do you think is causing the dizziness?"     Unsure 9. RECURRENT SYMPTOM: "Have you had dizziness before?" If so, ask: "When was the last time?" "What happened that time?"    10. OTHER SYMPTOMS: "Do you have any other symptoms?" (e.g., fever, chest pain, vomiting, diarrhea, bleeding)       Vision blurred  Protocols used: DIZZINESS Tereasa Coop- LIGHTHEADEDNESS-A-AH

## 2017-11-07 NOTE — ED Provider Notes (Signed)
[facility] EMERGENCY DEPARTMENT Provider Note   CSN: [id number] Arrival date & time: 11/07/17  1355     History   Chief Complaint Chief Complaint  Patient presents with  . Dizziness    HPI [name] is a 62 y.o. female.  Patient presented today the ED with a concern for possible strokelike symptoms.  That actually started on Wednesday.  Patient's had kind of some dizziness without vertigo and an abnormal gait.  Then last evening patient had blurred vision in both eyes for more than 20 minutes.  No other symptoms no headache no fever.  No facial droop no speech changes.  No extremity weakness no numbness no tingling.     Past Medical History:  Diagnosis Date  . Arthritis   . Asthma   . History of GI bleed    2010--- UPPER ESOPHAGITIS/ DUODERAL ULCER/ EROSION  . History of hiatal hernia   . History of kidney stones   . History of shingles    2013  . Myocardial infarction ([place]) 10 years ago   . Nephrolithiasis    BILATERAL  . Right ureteral stone   . Type 2 diabetes, diet controlled ([place])   . Urgency of urination     Patient Active Problem List   Diagnosis Date Noted  . Nephrolithiasis 07/08/2017  . Abnormal EKG 06/18/2017  . Preop cardiovascular exam 06/18/2017  . History of myocardial infarction 06/18/2017  . Chronic joint pain 10/08/2016  . Asthma 10/08/2016  . History of kidney stones 10/08/2016  . Insomnia 10/08/2016  . Diabetes mellitus type 2, noninsulin dependent ([place]) 09/10/2013  . Obesity (BMI 30-39.9) 09/10/2013  . GERD 03/15/2009    Past Surgical History:  Procedure Laterality Date  . CARDIAC CATHETERIZATION  02-10-2009  DR BENSIMHON   NORMAL CORONARIES/  LOW NORMAL LVF WITHOUT WALL MOTION ABNORMALITIES  . CARDIAC CATHETERIZATION  11-21-2010  DR Einar Gip   NORMAL CORONARIES/  EF 50-55%  . CHOLECYSTECTOMY  1990s   back in [place]   . CYSTOSCOPY W/ URETERAL STENT PLACEMENT Right 09/04/2013   Procedure: CYSTOSCOPY WITH  RETROGRADE PYELOGRAM/URETERAL STENT PLACEMENT;  Surgeon: Molli Hazard, MD;  Location: [facility];  Service: Urology;  Laterality: Right;  . CYSTOSCOPY WITH RETROGRADE PYELOGRAM, URETEROSCOPY AND STENT PLACEMENT Right 09/27/2013   Procedure: CYSTOSCOPY WITH RETROGRADE PYELOGRAM, URETEROSCOPY AND STENT EXCHANGE;  Surgeon: Molli Hazard, MD;  Location: [facility];  Service: Urology;  Laterality: Right;  . HOLMIUM LASER APPLICATION Right 8/0/3212   Procedure: HOLMIUM LASER APPLICATION;  Surgeon: Molli Hazard, MD;  Location: [facility];  Service: Urology;  Laterality: Right;  . HYSTEROSCOPY W/D&C  01-25-2003  . IR URETERAL STENT RIGHT NEW ACCESS W/O SEP NEPHROSTOMY CATH  07/08/2017  . LAPAROSCOPIC ASSISTED VAGINAL HYSTERECTOMY  03-02-2003   W/  BILATERAL SALPINGOOPHORECTOMY  . LAPAROSCOPY  EXTENSIVE LYSIS ADHESIONS/  REDO PARAESOPHAGEAL HIATAL HERNIA WITH PRIMARY CLOSURE AND MESH/ NISSEN FUNDOPLICATION (1cm)/  REPAIR GASTROTOMY  08-22-2009  . NEPHROLITHOTOMY Right 07/08/2017   Procedure: NEPHROLITHOTOMY PERCUTANEOUS;  Surgeon: Festus Aloe, MD;  Location: [facility];  Service: Urology;  Laterality: Right;  . NISSEN FUNDOPLICATION  2482   W/  CHOLECYSTECTOMY  . SHOULDER ARTHROSCOPY WITH SUBACROMIAL DECOMPRESSION, ROTATOR CUFF REPAIR AND BICEP TENDON REPAIR Left 08-02-2010   AND LABRAL DEBRIDEMENT  . TOTAL KNEE ARTHROPLASTY Right 07-01-2006  . TOTAL KNEE REVISION  02/25/2012   Procedure: TOTAL KNEE REVISION;  Surgeon: Meredith Pel, MD;  Location: [facility];  Service: Orthopedics;  Laterality: Right;  Revise right total knee arthroplasty  . TUBAL LIGATION  1990  . WRIST FUSION Left 2004   RETAINED HARDWARE     OB History   None      Home Medications    Prior to Admission medications   Medication Sig Start Date End Date Taking? Authorizing Provider  albuterol (PROVENTIL HFA;VENTOLIN HFA) 108 (90 Base) MCG/ACT inhaler Inhale 2 puffs into the  lungs every 4 (four) hours as needed for wheezing or shortness of breath. For shortness of breath 01/28/17  Yes Baity, Coralie Keens, NP  albuterol (PROVENTIL) (2.5 MG/3ML) 0.083% nebulizer solution Take 2.5 mg by nebulization every 6 (six) hours as needed for wheezing or shortness of breath.   Yes [provider]  aspirin EC 81 MG tablet Take 1 tablet (81 mg total) by mouth daily. 07/14/17  Yes Filippou, Braxton Feathers, MD  baclofen (LIORESAL) 10 MG tablet Take 5 mg by mouth 3 (three) times daily as needed for muscle spasms.   Yes [provider]  gabapentin (NEURONTIN) 800 MG tablet Take 1 tablet by mouth 2 (two) times daily. 10/13/17  Yes [provider]  glipiZIDE (GLUCOTROL) 10 MG tablet TAKE 1 TABLET BY MOUTH TWICE DAILY BEFORE MEAL(S) 06/02/17  Yes Baity, Coralie Keens, NP  meloxicam (MOBIC) 15 MG tablet Take 1 tablet (15 mg total) by mouth daily. 07/14/17  Yes Filippou, Braxton Feathers, MD  metFORMIN (GLUCOPHAGE) 1000 MG tablet TAKE 1 TABLET BY MOUTH TWICE DAILY WITH A MEAL 06/02/17  Yes Baity, Coralie Keens, NP  morphine (MS CONTIN) 30 MG 12 hr tablet Take 30 mg by mouth 2 (two) times daily. 07/29/16  Yes [provider]  Multiple Vitamin (MULTIVITAMIN WITH MINERALS) TABS tablet Take 1 tablet by mouth daily.   Yes [provider]  oxyCODONE-acetaminophen (PERCOCET) 10-325 MG tablet Take 1 tablet by mouth 4 (four) times daily as needed. For breakthrough pain. 07/09/17  Yes Filippou, Braxton Feathers, MD  tiZANidine (ZANAFLEX) 2 MG tablet Take 2 mg by mouth 3 (three) times daily as needed for spasms. 07/29/16  Yes [provider]  zolpidem (AMBIEN) 5 MG tablet Take 5 mg by mouth at bedtime. 08/01/16  Yes [provider]  blood glucose meter kit and supplies Dispense based on patient and insurance preference. Use up to four times daily as directed. (FOR ICD-10 E10.9, E11.9). 09/26/17   Elby Beck, FNP  docusate sodium (COLACE) 100 MG capsule Take 1 capsule (100 mg total) by  mouth 2 (two) times daily. 07/09/17   Filippou, Braxton Feathers, MD  oxybutynin (DITROPAN) 5 MG tablet Take 1 tablet (5 mg total) by mouth every 8 (eight) hours as needed for bladder spasms. 07/09/17   Filippou, Braxton Feathers, MD  predniSONE (DELTASONE) 10 MG tablet Take 6 x 1 day, 5x1, 4x1, 3x1, 2x1, 1x1.Do not take meloxicam within 24 hours of taking prednisone. 09/26/17   Elby Beck, FNP  tamsulosin (FLOMAX) 0.4 MG CAPS capsule Take 1 capsule (0.4 mg total) by mouth daily. 05/29/17   Pleas Koch, NP    Family History Family History  Problem Relation Age of Onset  . Heart disease Father     Social History Social History   Tobacco Use  . Smoking status: Never Smoker  . Smokeless tobacco: Never Used  Substance Use Topics  . Alcohol use: No  . Drug use: No     Allergies   Patient has no known allergies.   Review of  Systems Review of Systems  Constitutional: Negative for fever.  HENT: Negative for congestion.   Eyes: Positive for visual disturbance. Negative for photophobia, pain and redness.  Respiratory: Negative for shortness of breath.   Cardiovascular: Negative for chest pain.  Gastrointestinal: Negative for abdominal pain.  Genitourinary: Negative for dysuria.  Musculoskeletal: Negative for back pain.  Skin: Negative for rash.  Neurological: Positive for dizziness. Negative for headaches.  Hematological: Does not bruise/bleed easily.  Psychiatric/Behavioral: Negative for confusion.     Physical Exam Updated Vital Signs BP (!) 125/97   Pulse 86   Temp 98.3 F (36.8 C) (Oral)   Resp 16   Ht 1.6 m (_0 )   Wt 78 kg (172 lb)   SpO2 96%   BMI 30.47 kg/m   Physical Exam  Constitutional: She is oriented to person, place, and time. She appears well-developed and well-nourished. No distress.  HENT:  Head: Normocephalic and atraumatic.  Mouth/Throat: Oropharynx is clear and moist.  Eyes: Pupils are equal, round, and reactive to light. Conjunctivae and EOM are  normal.  Neck: Neck supple.  Cardiovascular: Normal rate, regular rhythm and normal heart sounds.  Pulmonary/Chest: Effort normal and breath sounds normal.  Abdominal: Soft. Bowel sounds are normal. There is no tenderness.  Musculoskeletal: Normal range of motion.  Neurological: She is alert and oriented to person, place, and time. No cranial nerve deficit or sensory deficit. She exhibits normal muscle tone. Coordination normal.  Skin: Skin is warm. No rash noted.  Nursing note and vitals reviewed.    ED Treatments / Results  Labs (all labs ordered are listed, but only abnormal results are displayed) Labs Reviewed  CBC - Abnormal; Notable for the following components:      Result Value   Hemoglobin 11.9 (*)    All other components within normal limits  COMPREHENSIVE METABOLIC PANEL - Abnormal; Notable for the following components:   Chloride 100 (*)    Glucose, Bld 216 (*)    BUN 22 (*)    All other components within normal limits  I-STAT CHEM 8, ED - Abnormal; Notable for the following components:   Chloride 98 (*)    BUN 25 (*)    Glucose, Bld 211 (*)    All other components within normal limits  PROTIME-INR  APTT  DIFFERENTIAL  I-STAT TROPONIN, ED  I-STAT BETA HCG BLOOD, ED ([facility], [facility], [facility] ONLY)  CBG MONITORING, ED    EKG EKG Interpretation  Date/Time:  Friday November 07 2017 13:59:13 EDT Ventricular Rate:  98 PR Interval:  178 QRS Duration: 86 QT Interval:  344 QTC Calculation: 439 R Axis:   42 Text Interpretation:  Sinus rhythm with frequent Premature ventricular complexes Otherwise normal ECG Confirmed by Fredia Sorrow [phone]) on 11/07/2017 7:43:37 PM   Radiology Ct Head Wo Contrast  Result Date: 11/07/2017 CLINICAL DATA:  Stroke-like symptoms since 12 p.m. yesterday. Headache with blurred vision and dizziness. EXAM: CT HEAD WITHOUT CONTRAST TECHNIQUE: Contiguous axial images were obtained from the base of the skull through the vertex without intravenous  contrast. COMPARISON:  None. FINDINGS: Brain: Ventricles, cisterns and other CSF spaces are normal. There is no mass, mass effect, shift of midline structures or acute hemorrhage. No evidence of acute infarction. Vascular: No hyperdense vessel or unexpected calcification. Skull: Couple small well-defined sclerotic foci noted likely bone islands and much less likely metastatic disease. Sinuses/Orbits: Orbits are normal. Paranasal sinuses are well aerated. There is a small osteoma over the left frontal sinus. Other:  None. IMPRESSION: No acute findings. Couple well-defined sclerotic foci over the skull likely bone islands although cannot exclude metastatic disease if patient has a known primary tumor. Electronically Signed   By: Marin Olp M.D.   On: 11/07/2017 15:59   Mr Brain Wo Contrast (neuro Protocol)  Result Date: 11/07/2017 CLINICAL DATA:  Initial evaluation for acute ataxia, stroke suspected. EXAM: MRI HEAD WITHOUT CONTRAST TECHNIQUE: Multiplanar, multiecho pulse sequences of the brain and surrounding structures were obtained without intravenous contrast. COMPARISON:  Prior CT from earlier same day as well as previous MRI from 12/04/2011. FINDINGS: Brain: Cerebral volume within normal limits for age. Few scattered patchy subcentimeter T2/FLAIR hyperintense foci noted within the periventricular and deep white matter both cerebral hemispheres, nonspecific, left felt to be within normal limits for age. No evidence for acute infarct. Gray-white matter differentiation maintained. No encephalomalacia to suggest chronic infarction. No acute or chronic intracranial hemorrhage. No mass lesion, midline shift or mass effect. No hydrocephalus. No extra-axial fluid collection. Pituitary gland normal. Vascular: Major intracranial vascular flow voids maintained. Skull and upper cervical spine: Craniocervical junction normal. Degenerative spondylolysis noted at C5-6 with at least moderate spinal stenosis, partially  visualized (series 9, image 12). Bone marrow signal intensity normal. No scalp soft tissue abnormality. Sinuses/Orbits: Globes and orbital soft tissues within normal limits. Paranasal sinuses clear. Small right mastoid effusion, of doubtful significance. Other: None. IMPRESSION: 1. No acute intracranial abnormality. 2. 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 Finding could be further assessed with dedicated MRI of the cervical spine as clinically warranted. Electronically Signed   By: Jeannine Boga M.D.   On: 11/07/2017 21:52    Procedures Procedures (including critical care time)  Medications Ordered in ED Medications - No data to display   Initial Impression / Assessment and Plan / ED Course  I have reviewed the triage vital signs and the nursing notes.  Pertinent labs & imaging results that were available during my care of the patient were reviewed by me and considered in my medical decision making (see chart for details).    Patient MRI head CT here negative.  Patient is able to ambulate now without abnormal gait.  Visual symptoms have resolved as well.  Discussed with Dr. Leonel Ramsay from neurology.  Patient symptoms not consistent with acute stroke or even TIA.  He gave her clearance for discharge home and follow-up with her primary care doctor.  Patient will return from for any new or worse symptoms.   Final Clinical Impressions(s) / ED Diagnoses   Final diagnoses:  Dizziness  Blurred vision, bilateral    ED Discharge Orders    None       Fredia Sorrow, MD 11/08/17 0008

## 2017-11-12 ENCOUNTER — Encounter: Payer: BLUE CROSS/BLUE SHIELD | Admitting: Internal Medicine

## 2017-11-12 DIAGNOSIS — Z0289 Encounter for other administrative examinations: Secondary | ICD-10-CM

## 2017-11-26 ENCOUNTER — Encounter (INDEPENDENT_AMBULATORY_CARE_PROVIDER_SITE_OTHER): Payer: Self-pay | Admitting: Physician Assistant

## 2017-11-26 ENCOUNTER — Ambulatory Visit (INDEPENDENT_AMBULATORY_CARE_PROVIDER_SITE_OTHER): Payer: Self-pay

## 2017-11-26 ENCOUNTER — Ambulatory Visit (INDEPENDENT_AMBULATORY_CARE_PROVIDER_SITE_OTHER): Payer: BLUE CROSS/BLUE SHIELD | Admitting: Physician Assistant

## 2017-11-26 DIAGNOSIS — M25552 Pain in left hip: Secondary | ICD-10-CM

## 2017-11-26 NOTE — Addendum Note (Signed)
Addended by: Rip HarbourKING, Annaclaire Walsworth M on: 11/26/2017 05:11 PM   Modules accepted: Orders

## 2017-11-26 NOTE — Progress Notes (Signed)
Office Visit Note   Patient: Jasmine ForestLinda S Anderson           Date of Birth: [birth year]           MRN: [id number] Visit Date: 11/26/2017              Requested by: Lorre MunroeBaity, Regina W, NP [address] [place], [place] [address] PCP: Lorre MunroeBaity, Regina W, NP   Assessment & Plan: Visit Diagnoses:  1. Pain in left hip     Plan: We will have her undergo an intra-articular injection of her left hip both for diagnostic and hopefully therapeutic purposes.  We will see her back 4 weeks after this to see her response to the injection.  Follow-Up Instructions: Return in about 1 month (around 12/24/2017).   Orders:  Orders Placed This Encounter  Procedures  . XR HIP UNILAT W OR W/O PELVIS 2-3 VIEWS LEFT   No orders of the defined types were placed in this encounter.     Procedures: No procedures performed   Clinical Data: No additional findings.   Subjective: Chief Complaint  Patient presents with  . Left Hip - Pain    HPI  [name] is a 62 year old female who comes in today for left hip pain.  Pain started over the last few weeks.  No known injury.  States that most her pain is in her left hip and groin area.  Some pain in her buttocks.  She does note some low back pain and some radicular symptoms down the left thigh down to the knee.  She had an MRI recently in May and is followed for back at the pain clinic.  She reports that she was given a injection lateral aspect of her hip for trochanteric bursitis few weeks ago and this really gave her no relief.  She has pain with walking standing sitting.  She does take chronic oxycodone for knee pain is really is not helping with her back.  Taking some over-the-counter NSAIDs do not take away the pain in his left hip and buttocks region.  She has no difficulty donning shoes or socks.  Review of Systems Please see HPI otherwise negative  Objective: Vital Signs: There were no vitals taken for this visit.  Physical Exam  Constitutional: She is  oriented to person, place, and time. She appears well-developed and well-nourished.  Pulmonary/Chest: Effort normal.  Neurological: She is alert and oriented to person, place, and time.  Psychiatric: She has a normal mood and affect.    Ortho Exam  5 out of 5 strength is throughout the lower extremities against resistance.  Slight tenderness over the left lower lumbar paraspinous region.  Rest the remainder of the lumbar spine nontender.  She has good forward flexion and extension of the lumbar spine without significant pain.  Tenderness over the left greater trochanteric region slight tenderness over the right greater trochanteric region.  She has fluid range of motion of both hips Negative straight leg raise bilaterally.  However the left hip she has pain with internal and external rotation. Specialty Comments:  No specialty comments available.  Imaging: Xr Hip Unilat W Or W/o Pelvis 2-3 Views Left  Result Date: 11/26/2017 AP pelvis lateral view of the left hip shows sclerotic activity both acetabulum.  Bilateral hips well located.  Slight narrowing of bilateral hip joints right greater than left.  No acute fractures.     PMFS History: Patient Active Problem List   Diagnosis Date Noted  .  Nephrolithiasis 07/08/2017  . Abnormal EKG 06/18/2017  . Preop cardiovascular exam 06/18/2017  . History of myocardial infarction 06/18/2017  . Chronic joint pain 10/08/2016  . Asthma 10/08/2016  . History of kidney stones 10/08/2016  . Insomnia 10/08/2016  . Diabetes mellitus type 2, noninsulin dependent (HCC) 09/10/2013  . Obesity (BMI 30-39.9) 09/10/2013  . GERD 03/15/2009   Past Medical History:  Diagnosis Date  . Arthritis   . Asthma   . History of GI bleed    2010--- UPPER ESOPHAGITIS/ DUODERAL ULCER/ EROSION  . History of hiatal hernia   . History of kidney stones   . History of shingles    2013  . Myocardial infarction (HCC) 10 years ago   . Nephrolithiasis    BILATERAL  .  Right ureteral stone   . Type 2 diabetes, diet controlled (HCC)   . Urgency of urination     Family History  Problem Relation Age of Onset  . Heart disease Father     Past Surgical History:  Procedure Laterality Date  . CARDIAC CATHETERIZATION  02-10-2009  DR BENSIMHON   NORMAL CORONARIES/  LOW NORMAL LVF WITHOUT WALL MOTION ABNORMALITIES  . CARDIAC CATHETERIZATION  11-21-2010  DR Jacinto Halim   NORMAL CORONARIES/  EF 50-55%  . CHOLECYSTECTOMY  1990s   back in [place]   . CYSTOSCOPY W/ URETERAL STENT PLACEMENT Right 09/04/2013   Procedure: CYSTOSCOPY WITH RETROGRADE PYELOGRAM/URETERAL STENT PLACEMENT;  Surgeon: Milford Cage, MD;  Location: [facility];  Service: Urology;  Laterality: Right;  . CYSTOSCOPY WITH RETROGRADE PYELOGRAM, URETEROSCOPY AND STENT PLACEMENT Right 09/27/2013   Procedure: CYSTOSCOPY WITH RETROGRADE PYELOGRAM, URETEROSCOPY AND STENT EXCHANGE;  Surgeon: Milford Cage, MD;  [facility];  Service: Urology;  Laterality: Right;  . HOLMIUM LASER APPLICATION Right 09/27/2013   Procedure: HOLMIUM LASER APPLICATION;  Surgeon: Milford Cage, MD;  Location: [facility];  Service: Urology;  Laterality: Right;  . HYSTEROSCOPY W/D&C  01-25-2003  . IR URETERAL STENT RIGHT NEW ACCESS W/O SEP NEPHROSTOMY CATH  07/08/2017  . LAPAROSCOPIC ASSISTED VAGINAL HYSTERECTOMY  03-02-2003   W/  BILATERAL SALPINGOOPHORECTOMY  . LAPAROSCOPY  EXTENSIVE LYSIS ADHESIONS/  REDO PARAESOPHAGEAL HIATAL HERNIA WITH PRIMARY CLOSURE AND MESH/ NISSEN FUNDOPLICATION (1cm)/  REPAIR GASTROTOMY  08-22-2009  . NEPHROLITHOTOMY Right 07/08/2017   Procedure: NEPHROLITHOTOMY PERCUTANEOUS;  Surgeon: Jerilee Field, MD;  Location: [facility];  Service: Urology;  Laterality: Right;  . NISSEN FUNDOPLICATION  2000   W/  CHOLECYSTECTOMY  . SHOULDER ARTHROSCOPY WITH SUBACROMIAL DECOMPRESSION, ROTATOR CUFF REPAIR AND BICEP TENDON REPAIR Left 08-02-2010   AND LABRAL DEBRIDEMENT   . TOTAL KNEE ARTHROPLASTY Right 07-01-2006  . TOTAL KNEE REVISION  02/25/2012   Procedure: TOTAL KNEE REVISION;  Surgeon: Cammy Copa, MD;  Location[facility];  Service: Orthopedics;  Laterality: Right;  Revise right total knee arthroplasty  . TUBAL LIGATION  1990  . WRIST FUSION Left 2004   RETAINED HARDWARE   Social History   Occupational History  . Not on file  Tobacco Use  . Smoking status: Never Smoker  . Smokeless tobacco: Never Used  Substance and Sexual Activity  . Alcohol use: No  . Drug use: No  . Sexual activity: Not on file

## 2017-12-10 ENCOUNTER — Telehealth (INDEPENDENT_AMBULATORY_CARE_PROVIDER_SITE_OTHER): Payer: Self-pay | Admitting: Physician Assistant

## 2017-12-10 NOTE — Telephone Encounter (Signed)
Returned call to patient left message to return call  [phone]

## 2017-12-15 ENCOUNTER — Ambulatory Visit (INDEPENDENT_AMBULATORY_CARE_PROVIDER_SITE_OTHER): Payer: BLUE CROSS/BLUE SHIELD | Admitting: Physician Assistant

## 2017-12-15 ENCOUNTER — Telehealth (INDEPENDENT_AMBULATORY_CARE_PROVIDER_SITE_OTHER): Payer: Self-pay | Admitting: Physician Assistant

## 2017-12-15 ENCOUNTER — Encounter (INDEPENDENT_AMBULATORY_CARE_PROVIDER_SITE_OTHER): Payer: Self-pay | Admitting: Physician Assistant

## 2017-12-15 DIAGNOSIS — M25552 Pain in left hip: Secondary | ICD-10-CM

## 2017-12-15 NOTE — Telephone Encounter (Signed)
Faxed to provided number  

## 2017-12-15 NOTE — Progress Notes (Signed)
HPI: [name] returns today follow-up of her left hip pain.  There was some difficulty in setting of her left hip intra-articular injection but is now scheduled for 01/01/2018.  She states that her left hip pain is getting worse.  She is having difficulty walking and now using a cane when out of her home and at times even in the home.  She is in pain management and receives her narcotics from that center.  She is asking about any thing else that can be done for her hip pain.  Physical exam: Left hip she has pain with internal and external rotation but overall fluid motion of the hip.  She ambulates with a cane and antalgic gait.  Impression: Left hip pain  Plan: At this point time we will  see her back 2 weeks after the left hip intra-articular injection.  She is out of work until further notice.  Will reevaluate her work status at follow-up.  Handicap placard paperwork was filled out for her today.  We also gave her a copy of the last office note.  She will call our office to receive a copy of today's note.  She is needing her notes for her out of work status.

## 2017-12-15 NOTE — Telephone Encounter (Signed)
Patient was told to call back with info for office visits notes need to be sent to [facility]:  Fax # [phone] or [phone]  Case leave # [id number] AA WIN # [id number] Disability Claim # [id number]

## 2018-01-01 ENCOUNTER — Ambulatory Visit (INDEPENDENT_AMBULATORY_CARE_PROVIDER_SITE_OTHER): Payer: BLUE CROSS/BLUE SHIELD | Admitting: Physical Medicine and Rehabilitation

## 2018-01-06 ENCOUNTER — Other Ambulatory Visit: Payer: Self-pay | Admitting: Internal Medicine

## 2018-01-09 ENCOUNTER — Ambulatory Visit (INDEPENDENT_AMBULATORY_CARE_PROVIDER_SITE_OTHER): Payer: BLUE CROSS/BLUE SHIELD | Admitting: Physical Medicine and Rehabilitation

## 2018-01-09 ENCOUNTER — Ambulatory Visit (INDEPENDENT_AMBULATORY_CARE_PROVIDER_SITE_OTHER): Payer: Self-pay

## 2018-01-09 DIAGNOSIS — M25552 Pain in left hip: Secondary | ICD-10-CM | POA: Diagnosis not present

## 2018-01-09 MED ORDER — BUPIVACAINE HCL 0.5 % IJ SOLN
3.0000 mL | INTRAMUSCULAR | Status: AC | PRN
  Administered 2018-01-09: 3 mL via INTRA_ARTICULAR

## 2018-01-09 MED ORDER — TRIAMCINOLONE ACETONIDE 40 MG/ML IJ SUSP
80.0000 mg | INTRAMUSCULAR | Status: AC | PRN
Start: 2018-01-09 — End: 2018-01-09
  Administered 2018-01-09: 80 mg via INTRA_ARTICULAR

## 2018-01-09 NOTE — Progress Notes (Signed)
 .  Numeric Pain Rating Scale and Functional Assessment Average Pain 6   In the last MONTH (on 0-10 scale) has pain interfered with the following?  1. General activity like being  able to carry out your everyday physical activities such as walking, climbing stairs, carrying groceries, or moving a chair?  Rating(3)   -Dye Allergies.  

## 2018-01-09 NOTE — Progress Notes (Signed)
Jasmine ForestLinda S Anderson - 62 y.o. female MRN [id number]  Date of birth: [birth year]  Office Visit Note: Visit Date: 01/09/2018 PCP: Lorre MunroeBaity, Regina W, NP Referred by: Lorre MunroeBaity, Regina W, NP  Subjective: Chief Complaint  Patient presents with  . Left Hip - Pain   HPI: [name] is a 62 year old female with chronic pain syndrome on 120 mg equivalent of morphine.  She is on long-acting morphine daily with breakthrough oxycodone.  She comes in today at the request of Rexene EdisonGil Clark, PA-C for diagnostic of therapeutic left hip anesthetic arthrogram.  Patient is having 6 out of 10 pain despite medication with left hip and groin pain.  Has been going on for many months.  Worse with standing and sitting.  Nothing down the leg into the foot.  She gets pain pretty classic hip distribution in the anterior groin and anterior thigh to the knee.   ROS Otherwise per HPI.  Assessment & Plan: Visit Diagnoses:  1. Pain in left hip     Plan: Findings:  Diagnostic left therapeutic anesthetic hip arthrogram on the left.  Patient seemed indicate she had some mild relief during the anesthetic phase.    Meds & Orders: No orders of the defined types were placed in this encounter.   Orders Placed This Encounter  Procedures  . Large Joint Inj: L hip joint  . XR C-ARM NO REPORT    Follow-up: Return for Dr. Doneen Poissonhristopher Blackman as scheduled.   Procedures: Large Joint Inj: L hip joint on 01/09/2018 10:40 AM Indications: pain and diagnostic evaluation Details: 22 G needle, anterior approach  Arthrogram: Yes  Medications: 3 mL bupivacaine 0.5 %; 80 mg triamcinolone acetonide 40 MG/ML Outcome: tolerated well, no immediate complications  Arthrogram demonstrated excellent flow of contrast throughout the joint surface without extravasation or obvious defect.  The patient had mild relief of symptoms during the anesthetic phase of the injection.  Procedure, treatment alternatives, risks and benefits explained, specific risks  discussed. Consent was given by the patient. Immediately prior to procedure a time out was called to verify the correct patient, procedure, equipment, support staff and site/side marked as required. Patient was prepped and draped in the usual sterile fashion.      No notes on file   Clinical History: No specialty comments available.   She reports that she has never smoked. She has never used smokeless tobacco.  Recent Labs    07/03/17 1139 09/26/17 1116  HGBA1C 8.2* 8.5*    Objective:  VS:  HT:    WT:   BMI:     BP:   HR: bpm  TEMP: ( )  RESP:  Physical Exam  Ortho Exam Imaging: Xr C-arm No Report  Result Date: 01/09/2018 Please see Notes tab for imaging impression.   Past Medical/Family/Surgical/Social History: Medications & Allergies reviewed per EMR, new medications updated. Patient Active Problem List   Diagnosis Date Noted  . Nephrolithiasis 07/08/2017  . Abnormal EKG 06/18/2017  . Preop cardiovascular exam 06/18/2017  . History of myocardial infarction 06/18/2017  . Chronic joint pain 10/08/2016  . Asthma 10/08/2016  . History of kidney stones 10/08/2016  . Insomnia 10/08/2016  . Diabetes mellitus type 2, noninsulin dependent (HCC) 09/10/2013  . Obesity (BMI 30-39.9) 09/10/2013  . GERD 03/15/2009   Past Medical History:  Diagnosis Date  . Arthritis   . Asthma   . History of GI bleed    2010--- UPPER ESOPHAGITIS/ DUODERAL ULCER/ EROSION  . History of hiatal hernia   .  History of kidney stones   . History of shingles    2013  . Myocardial infarction (HCC) 10 years ago   . Nephrolithiasis    BILATERAL  . Right ureteral stone   . Type 2 diabetes, diet controlled (HCC)   . Urgency of urination    Family History  Problem Relation Age of Onset  . Heart disease Father    Past Surgical History:  Procedure Laterality Date  . CARDIAC CATHETERIZATION  02-10-2009  DR BENSIMHON   NORMAL CORONARIES/  LOW NORMAL LVF WITHOUT WALL MOTION ABNORMALITIES  .  CARDIAC CATHETERIZATION  11-21-2010  DR Jacinto HalimGANJI   NORMAL CORONARIES/  EF 50-55%  . CHOLECYSTECTOMY  1990s   back in [place]   . CYSTOSCOPY W/ URETERAL STENT PLACEMENT Right 09/04/2013   Procedure: CYSTOSCOPY WITH RETROGRADE PYELOGRAM/URETERAL STENT PLACEMENT;  Surgeon: Milford Cageaniel Young Woodruff, MD;  Location: [facility];  Service: Urology;  Laterality: Right;  . CYSTOSCOPY WITH RETROGRADE PYELOGRAM, URETEROSCOPY AND STENT PLACEMENT Right 09/27/2013   Procedure: CYSTOSCOPY WITH RETROGRADE PYELOGRAM, URETEROSCOPY AND STENT EXCHANGE;  Surgeon: Milford Cageaniel Young Woodruff, MD;  [facility];  Service: Urology;  Laterality: Right;  . HOLMIUM LASER APPLICATION Right 09/27/2013   Procedure: HOLMIUM LASER APPLICATION;  Surgeon: Milford Cageaniel Young Woodruff, MD;  [facility] ;  Service: Urology;  Laterality: Right;  . HYSTEROSCOPY W/D&C  01-25-2003  . IR URETERAL STENT RIGHT NEW ACCESS W/O SEP NEPHROSTOMY CATH  07/08/2017  . LAPAROSCOPIC ASSISTED VAGINAL HYSTERECTOMY  03-02-2003   W/  BILATERAL SALPINGOOPHORECTOMY  . LAPAROSCOPY  EXTENSIVE LYSIS ADHESIONS/  REDO PARAESOPHAGEAL HIATAL HERNIA WITH PRIMARY CLOSURE AND MESH/ NISSEN FUNDOPLICATION (1cm)/  REPAIR GASTROTOMY  08-22-2009  . NEPHROLITHOTOMY Right 07/08/2017   Procedure: NEPHROLITHOTOMY PERCUTANEOUS;  Surgeon: Jerilee FieldEskridge, Matthew, MD;  Location: [facility];  Service: Urology;  Laterality: Right;  . NISSEN FUNDOPLICATION  2000   W/  CHOLECYSTECTOMY  . SHOULDER ARTHROSCOPY WITH SUBACROMIAL DECOMPRESSION, ROTATOR CUFF REPAIR AND BICEP TENDON REPAIR Left 08-02-2010   AND LABRAL DEBRIDEMENT  . TOTAL KNEE ARTHROPLASTY Right 07-01-2006  . TOTAL KNEE REVISION  02/25/2012   Procedure: TOTAL KNEE REVISION;  Surgeon: Cammy CopaGregory Scott Dean, MD;  [facility];  Service: Orthopedics;  Laterality: Right;  Revise right total knee arthroplasty  . TUBAL LIGATION  1990  . WRIST FUSION Left 2004   RETAINED HARDWARE   Social History    Occupational History  . Not on file  Tobacco Use  . Smoking status: Never Smoker  . Smokeless tobacco: Never Used  Substance and Sexual Activity  . Alcohol use: No  . Drug use: No  . Sexual activity: Not on file

## 2018-01-09 NOTE — Patient Instructions (Signed)

## 2018-01-15 ENCOUNTER — Encounter (INDEPENDENT_AMBULATORY_CARE_PROVIDER_SITE_OTHER): Payer: Self-pay | Admitting: Orthopaedic Surgery

## 2018-01-15 ENCOUNTER — Ambulatory Visit (INDEPENDENT_AMBULATORY_CARE_PROVIDER_SITE_OTHER): Payer: BLUE CROSS/BLUE SHIELD | Admitting: Orthopaedic Surgery

## 2018-01-15 DIAGNOSIS — M25552 Pain in left hip: Secondary | ICD-10-CM | POA: Diagnosis not present

## 2018-01-15 NOTE — Progress Notes (Signed)
Patient returns for follow-up after having an intra-articular injection in her left hip under direct fluoroscopy by Dr. Alvester MorinNewton.  She had debilitating hip pain and is also had a trochanteric steroid injection.  She is coming in today walking without assistive device doing much better overall.  I did review her x-rays from previous and it showed no significant arthritic changes in her left or right hip at all.  On exam today I can easily move her left hip through full internal and external rotation without any pain at all and there is no significant pain over the trochanteric area.  She is walking without assistive device and appears comfortable.  This point she will follow-up as needed.  Hopefully this will continue to improve with time.  Is not even been a week since her injection and she is doing much better overall.  All question concerns were answered and addressed.  Follow-up will be as needed.

## 2018-02-20 ENCOUNTER — Encounter: Payer: Self-pay | Admitting: Primary Care

## 2018-02-20 ENCOUNTER — Ambulatory Visit (INDEPENDENT_AMBULATORY_CARE_PROVIDER_SITE_OTHER): Payer: BLUE CROSS/BLUE SHIELD | Admitting: Primary Care

## 2018-02-20 ENCOUNTER — Ambulatory Visit: Payer: Self-pay | Admitting: *Deleted

## 2018-02-20 VITALS — BP 136/86 | HR 65 | Temp 97.8°F | Ht 63.0 in | Wt 171.2 lb

## 2018-02-20 DIAGNOSIS — J069 Acute upper respiratory infection, unspecified: Secondary | ICD-10-CM

## 2018-02-20 MED ORDER — BENZONATATE 200 MG PO CAPS: 200.0000 mg | ORAL_CAPSULE | Freq: Three times a day (TID) | ORAL | 0 refills | Status: DC | PRN

## 2018-02-20 NOTE — Telephone Encounter (Signed)
Pt called with having several symptoms of upper respiratory. She has sinus congestion with headache. Is not having nasal drainage. Has a hx of asthma and wheezing, some shortness of breath. Having chills but checked temp. States grandson came over with upper respiratoy symptoms (he is 2). Daughter is coughing up green secretions.  Pt denies cough. Feels fatigue. Not able to work today.  Appointment scheduled for this afternoon. Home care advice given to her with verbal understanding. Routing to flow at [facility] at [facility].  Reason for Disposition . [1] Sinus congestion (pressure, fullness) AND [2] present > 10 days  Answer Assessment - Initial Assessment Questions 1. ONSET: "When did the nasal discharge start?"      no 2. AMOUNT: "How much discharge is there?"      none 3. COUGH: "Do you have a cough?" If yes, ask: "Describe the color of your sputum" (clear, white, yellow, green)     no 4. RESPIRATORY DISTRESS: "Describe your breathing."      Some wheezing and shortness of breath 5. FEVER: "Do you have a fever?" If so, ask: "What is your temperature, how was it measured, and when did it start?"     Having chills not sure if having fever 6. SEVERITY: "Overall, how bad are you feeling right now?" (e.g., doesn't interfere with normal activities, staying home from school/work, staying in bed)      Staying home from work 7. OTHER SYMPTOMS: "Do you have any other symptoms?" (e.g., sore throat, earache, wheezing, vomiting)     Wheezing, sore throat, nausea  8. PREGNANCY: "Is there any chance you are pregnant?" "When was your last menstrual period?"     No periods.  Protocols used: COMMON COLD-A-AH

## 2018-02-20 NOTE — Progress Notes (Signed)
Subjective:    Patient ID: [name], female    DOB: 1956/03/13, 62 y.o.   MRN: [id number]  HPI  [name] is a 62 year old female with a history of GERD, asthma, type 2 diabetes, who presents today with a chief complaint of cough.  She also reports nasal congestion, chills, nausea, fatigue. Her symptoms began two days ago. She's been exposed to several sick people in her house. She's not checked her temperature. She's taken Theraflu and Tylenol with some improvement.   Review of Systems  Constitutional: Positive for chills and fatigue.  HENT: Positive for congestion. Negative for sinus pressure.   Respiratory: Positive for cough.   Cardiovascular: Negative for chest pain.  Neurological: Positive for headaches.       Past Medical History:  Diagnosis Date  . Arthritis   . Asthma   . History of GI bleed    2010--- UPPER ESOPHAGITIS/ DUODERAL ULCER/ EROSION  . History of hiatal hernia   . History of kidney stones   . History of shingles    2013  . Myocardial infarction ([place]) 10 years ago   . Nephrolithiasis    BILATERAL  . Right ureteral stone   . Type 2 diabetes, diet controlled ([place])   . Urgency of urination      Social History   Socioeconomic History  . Marital status: Widowed    Spouse name: Not on file  . Number of children: Not on file  . Years of education: Not on file  . Highest education level: Not on file  Occupational History  . Not on file  Social Needs  . Financial resource strain: Not on file  . Food insecurity:    Worry: Not on file    Inability: Not on file  . Transportation needs:    Medical: Not on file    Non-medical: Not on file  Tobacco Use  . Smoking status: Never Smoker  . Smokeless tobacco: Never Used  Substance and Sexual Activity  . Alcohol use: No  . Drug use: No  . Sexual activity: Not on file  Lifestyle  . Physical activity:    Days per week: Not on file    Minutes per session: Not on file  . Stress: Not on file    Relationships  . Social connections:    Talks on phone: Not on file    Gets together: Not on file    Attends religious service: Not on file    Active member of club or organization: Not on file    Attends meetings of clubs or organizations: Not on file    Relationship status: Not on file  . Intimate partner violence:    Fear of current or ex partner: Not on file    Emotionally abused: Not on file    Physically abused: Not on file    Forced sexual activity: Not on file  Other Topics Concern  . Not on file  Social History Narrative  . Not on file    Past Surgical History:  Procedure Laterality Date  . CARDIAC CATHETERIZATION  02-10-2009  DR BENSIMHON   NORMAL CORONARIES/  LOW NORMAL LVF WITHOUT WALL MOTION ABNORMALITIES  . CARDIAC CATHETERIZATION  11-21-2010  DR Einar Gip   NORMAL CORONARIES/  EF 50-55%  . CHOLECYSTECTOMY  1990s   back in [place]   . CYSTOSCOPY W/ URETERAL STENT PLACEMENT Right 09/04/2013   Procedure: CYSTOSCOPY WITH RETROGRADE PYELOGRAM/URETERAL STENT PLACEMENT;  Surgeon: Molli Hazard, MD;  Location: [facility];  Service: Urology;  Laterality: Right;  . CYSTOSCOPY WITH RETROGRADE PYELOGRAM, URETEROSCOPY AND STENT PLACEMENT Right 09/27/2013   Procedure: CYSTOSCOPY WITH RETROGRADE PYELOGRAM, URETEROSCOPY AND STENT EXCHANGE;  Surgeon: Molli Hazard, MD;  Location: [facility];  Service: Urology;  Laterality: Right;  . HOLMIUM LASER APPLICATION Right 12/27/3381   Procedure: HOLMIUM LASER APPLICATION;  Surgeon: Molli Hazard, MD;  Location: [facility];  Service: Urology;  Laterality: Right;  . HYSTEROSCOPY W/D&C  01-25-2003  . IR URETERAL STENT RIGHT NEW ACCESS W/O SEP NEPHROSTOMY CATH  07/08/2017  . LAPAROSCOPIC ASSISTED VAGINAL HYSTERECTOMY  03-02-2003   W/  BILATERAL SALPINGOOPHORECTOMY  . LAPAROSCOPY  EXTENSIVE LYSIS ADHESIONS/  REDO PARAESOPHAGEAL HIATAL HERNIA WITH PRIMARY CLOSURE AND MESH/ NISSEN FUNDOPLICATION (1cm)/   REPAIR GASTROTOMY  08-22-2009  . NEPHROLITHOTOMY Right 07/08/2017   Procedure: NEPHROLITHOTOMY PERCUTANEOUS;  Surgeon: Festus Aloe, MD;  Location: [facility];  Service: Urology;  Laterality: Right;  . NISSEN FUNDOPLICATION  2919   W/  CHOLECYSTECTOMY  . SHOULDER ARTHROSCOPY WITH SUBACROMIAL DECOMPRESSION, ROTATOR CUFF REPAIR AND BICEP TENDON REPAIR Left 08-02-2010   AND LABRAL DEBRIDEMENT  . TOTAL KNEE ARTHROPLASTY Right 07-01-2006  . TOTAL KNEE REVISION  02/25/2012   Procedure: TOTAL KNEE REVISION;  Surgeon: Meredith Pel, MD;  Location: [facility];  Service: Orthopedics;  Laterality: Right;  Revise right total knee arthroplasty  . TUBAL LIGATION  1990  . WRIST FUSION Left 2004   RETAINED HARDWARE    Family History  Problem Relation Age of Onset  . Heart disease Father     No Known Allergies  Current Outpatient Medications on File Prior to Visit  Medication Sig Dispense Refill  . albuterol (PROVENTIL HFA;VENTOLIN HFA) 108 (90 Base) MCG/ACT inhaler Inhale 2 puffs into the lungs every 4 (four) hours as needed for wheezing or shortness of breath. For shortness of breath 18 g 0  . albuterol (PROVENTIL) (2.5 MG/3ML) 0.083% nebulizer solution Take 2.5 mg by nebulization every 6 (six) hours as needed for wheezing or shortness of breath.    Marland Kitchen aspirin EC 81 MG tablet Take 1 tablet (81 mg total) by mouth daily.    . baclofen (LIORESAL) 10 MG tablet Take 5 mg by mouth 3 (three) times daily as needed for muscle spasms.    . blood glucose meter kit and supplies Dispense based on patient and insurance preference. Use up to four times daily as directed. (FOR ICD-10 E10.9, E11.9). 1 each 0  . docusate sodium (COLACE) 100 MG capsule Take 1 capsule (100 mg total) by mouth 2 (two) times daily. 30 capsule 0  . gabapentin (NEURONTIN) 800 MG tablet Take 1 tablet by mouth 2 (two) times daily.    Marland Kitchen glipiZIDE (GLUCOTROL) 10 MG tablet TAKE 1 TABLET BY MOUTH TWICE DAILY BEFORE MEAL(S) 60 tablet 0  . meloxicam  (MOBIC) 15 MG tablet Take 1 tablet (15 mg total) by mouth daily.    . metFORMIN (GLUCOPHAGE) 1000 MG tablet TAKE 1 TABLET BY MOUTH TWICE DAILY WITH A MEAL 60 tablet 0  . morphine (MS CONTIN) 30 MG 12 hr tablet Take 30 mg by mouth 2 (two) times daily.    . Multiple Vitamin (MULTIVITAMIN WITH MINERALS) TABS tablet Take 1 tablet by mouth daily.    Marland Kitchen oxybutynin (DITROPAN) 5 MG tablet Take 1 tablet (5 mg total) by mouth every 8 (eight) hours as needed for bladder spasms. 30 tablet 0  . oxyCODONE-acetaminophen (PERCOCET) 10-325 MG tablet Take  1 tablet by mouth 4 (four) times daily as needed. For breakthrough pain. 15 tablet 0  . predniSONE (DELTASONE) 10 MG tablet Take 6 x 1 day, 5x1, 4x1, 3x1, 2x1, 1x1.Do not take meloxicam within 24 hours of taking prednisone. 21 tablet 0  . tamsulosin (FLOMAX) 0.4 MG CAPS capsule Take 1 capsule (0.4 mg total) by mouth daily. 14 capsule 0  . tiZANidine (ZANAFLEX) 2 MG tablet Take 2 mg by mouth 3 (three) times daily as needed for spasms.    Marland Kitchen zolpidem (AMBIEN) 5 MG tablet Take 5 mg by mouth at bedtime.     No current facility-administered medications on file prior to visit.     BP 136/86   Pulse 65   Temp 97.8 F (36.6 C) (Oral)   Ht '5\' 3"'  (1.6 m)   Wt 171 lb 4 oz (77.7 kg)   SpO2 97%   BMI 30.34 kg/m    Objective:   Physical Exam  Constitutional: She appears well-nourished. She does not appear ill.  HENT:  Right Ear: Tympanic membrane and ear canal normal.  Left Ear: Tympanic membrane and ear canal normal.  Nose: Mucosal edema present. Right sinus exhibits no maxillary sinus tenderness and no frontal sinus tenderness. Left sinus exhibits no maxillary sinus tenderness and no frontal sinus tenderness.  Mouth/Throat: Oropharynx is clear and moist.  Neck: Neck supple.  Cardiovascular: Normal rate and regular rhythm.  Respiratory: Effort normal and breath sounds normal. She has no wheezes.  Skin: Skin is warm and dry.           Assessment & Plan:    URI:  Cough, chills, fatigue, congestion x 2 days. Exam today with clear lungs, doesn't appear sickly. Suspect viral URI. No suspicion for influenza based off of exam and HPI. Rx for Ladona Ridgel sent to pharmacy. Okay to use Tylenol PRN. Fluids, rest, return precautions provided.  Pleas Koch, NP

## 2018-02-20 NOTE — Patient Instructions (Signed)
Your symptoms are representative of a viral illness which will resolve on its own over time. Our goal is to treat your symptoms in order to aid your body in the healing process and to make you more comfortable.   You may take Benzonatate capsules for cough. Take 1 capsule by mouth three times daily as needed for cough.  You can take Tylenol as needed for chills and body aches.   Please call me if you start running fevers of 101 for more than one day, you feel worse on Thursday next week.  It was a pleasure meeting you!   Upper Respiratory Infection, Adult Most upper respiratory infections (URIs) are caused by a virus. A URI affects the nose, throat, and upper air passages. The most common type of URI is often called "the common cold." Follow these instructions at home:  Take medicines only as told by your doctor.  Gargle warm saltwater or take cough drops to comfort your throat as told by your doctor.  Use a warm mist humidifier or inhale steam from a shower to increase air moisture. This may make it easier to breathe.  Drink enough fluid to keep your pee (urine) clear or pale yellow.  Eat soups and other clear broths.  Have a healthy diet.  Rest as needed.  Go back to work when your fever is gone or your doctor says it is okay. ? You may need to stay home longer to avoid giving your URI to others. ? You can also wear a face mask and wash your hands often to prevent spread of the virus.  Use your inhaler more if you have asthma.  Do not use any tobacco products, including cigarettes, chewing tobacco, or electronic cigarettes. If you need help quitting, ask your doctor. Contact a doctor if:  You are getting worse, not better.  Your symptoms are not helped by medicine.  You have chills.  You are getting more short of breath.  You have brown or red mucus.  You have yellow or brown discharge from your nose.  You have pain in your face, especially when you bend  forward.  You have a fever.  You have puffy (swollen) neck glands.  You have pain while swallowing.  You have white areas in the back of your throat. Get help right away if:  You have very bad or constant: ? Headache. ? Ear pain. ? Pain in your forehead, behind your eyes, and over your cheekbones (sinus pain). ? Chest pain.  You have long-lasting (chronic) lung disease and any of the following: ? Wheezing. ? Long-lasting cough. ? Coughing up blood. ? A change in your usual mucus.  You have a stiff neck.  You have changes in your: ? Vision. ? Hearing. ? Thinking. ? Mood. This information is not intended to replace advice given to you by your health care provider. Make sure you discuss any questions you have with your health care provider. Document Released: 10/30/2007 Document Revised: 01/14/2016 Document Reviewed: 08/18/2013 Elsevier Interactive Patient Education  2018 [employer].

## 2019-06-09 IMAGING — NM NM MISC PROCEDURE
4 series · 24 of 24 positions shown · non-contrast
Comparison: none

[Series 1: wbr_s-proj_st stress · 6.40mm/px · 6 of 64 frames shown]
[frame 6/64]
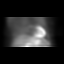
[frame 16/64]
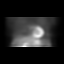
[frame 27/64]
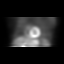
[frame 38/64]
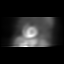
[frame 48/64]
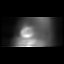
[frame 59/64]
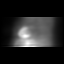

[Series 1: stress · 6.40mm/px · 6 of 64 frames shown]
[frame 6/64]
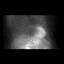
[frame 16/64]
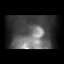
[frame 27/64]
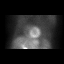
[frame 38/64]
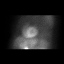
[frame 48/64]
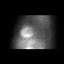
[frame 59/64]
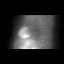

[Series 1: rest · 6.40mm/px · 6 of 64 frames shown]
[frame 6/64]
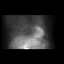
[frame 16/64]
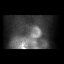
[frame 27/64]
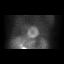
[frame 38/64]
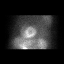
[frame 48/64]
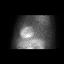
[frame 59/64]
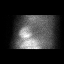

[Series 1: wbr_r-proj_st rest · 6.40mm/px · 6 of 64 frames shown]
[frame 6/64]
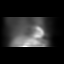
[frame 16/64]
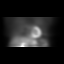
[frame 27/64]
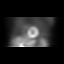
[frame 38/64]
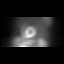
[frame 48/64]
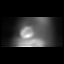
[frame 59/64]
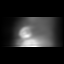

[24 of 24 positions shown; findings below may reference images not displayed]

Canned report from images found in remote index.

Refer to host system for actual result text.

## 2019-11-15 ENCOUNTER — Emergency Department (HOSPITAL_COMMUNITY)
Admission: EM | Admit: 2019-11-15 | Discharge: 2019-11-15 | Disposition: A | Discharge status: Home / Self Care | Payer: Medicaid Other | Attending: Emergency Medicine | Admitting: Emergency Medicine

## 2019-11-15 ENCOUNTER — Other Ambulatory Visit: Payer: Self-pay

## 2019-11-15 DIAGNOSIS — E119 Type 2 diabetes mellitus without complications: Secondary | ICD-10-CM | POA: Diagnosis not present

## 2019-11-15 DIAGNOSIS — Z7984 Long term (current) use of oral hypoglycemic drugs: Secondary | ICD-10-CM | POA: Diagnosis not present

## 2019-11-15 DIAGNOSIS — M62838 Other muscle spasm: Secondary | ICD-10-CM | POA: Insufficient documentation

## 2019-11-15 LAB — BASIC METABOLIC PANEL
Anion gap: 10 (ref 5–15)
BUN: 18 mg/dL (ref 8–23)
CO2: 31 mmol/L (ref 22–32)
Calcium: 9.8 mg/dL (ref 8.9–10.3)
Chloride: 102 mmol/L (ref 98–111)
Creatinine, Ser: 0.79 mg/dL (ref 0.44–1.00)
GFR calc Af Amer: >60 mL/min (ref >60)
GFR calc non Af Amer: >60 mL/min (ref >60)
Glucose, Bld: 123 mg/dL — ABNORMAL HIGH (ref 70–99)
Potassium: 4.1 mmol/L (ref 3.5–5.1)
Sodium: 143 mmol/L (ref 135–145)

## 2019-11-15 LAB — CBC WITH DIFFERENTIAL/PLATELET
Abs Immature Granulocytes: 0.03 10*3/uL (ref 0.00–0.07)
Basophils Absolute: 0 10*3/uL (ref 0.0–0.1)
Basophils Relative: 0 %
Eosinophils Absolute: 0.1 10*3/uL (ref 0.0–0.5)
Eosinophils Relative: 1 %
HCT: 43.2 % (ref 36.0–46.0)
Hemoglobin: 13.1 g/dL (ref 12.0–15.0)
Immature Granulocytes: 0 %
Lymphocytes Relative: 33 %
Lymphs Abs: 2.5 10*3/uL (ref 0.7–4.0)
MCH: 28.8 pg (ref 26.0–34.0)
MCHC: 30.3 g/dL (ref 30.0–36.0)
MCV: 94.9 fL (ref 80.0–100.0)
Monocytes Absolute: 0.5 10*3/uL (ref 0.1–1.0)
Monocytes Relative: 7 %
Neutro Abs: 4.6 10*3/uL (ref 1.7–7.7)
Neutrophils Relative %: 59 %
Platelets: 199 10*3/uL (ref 150–400)
RBC: 4.55 MIL/uL (ref 3.87–5.11)
RDW: 12.8 % (ref 11.5–15.5)
WBC: 7.8 10*3/uL (ref 4.0–10.5)
nRBC: 0 % (ref 0.0–0.2)

## 2019-11-15 NOTE — ED Triage Notes (Signed)
Pt BIB EMS from home. Pt states she took her pain meds as normal for chronic back pains. Pt c/o feeling sick. Pt then took Narcan intranasal 2 mg. Pt states she is continuing to feel twitchy, muscle spasms in arms and legs. Pt then called for EMS.   170/110 HR 100 resp 20 95% RA CBG 106

## 2019-11-15 NOTE — Discharge Instructions (Signed)
Please follow-up with your primary care provider in the next 1 to 2 days to discuss this visit.  Please return to the emergency department if you develop any new or worsening symptoms.  It was a pleasure to meet you.

## 2019-11-15 NOTE — ED Provider Notes (Signed)
[place] DEPT Provider Note   CSN: [id number] Arrival date & time: 11/15/19  1132     History Chief Complaint  Patient presents with  . Spasms    [name] is a 64 y.o. female.  HPI   Patient is a 64 year old female with a medical history as noted below.  Patient states this morning she took her regular medications for chronic back pain.  She took meloxicam, tizanidine, oxycodone, gabapentin.  Later in the morning she felt that she was still experiencing her chronic pain and was worried she did not take her morning dose of tizanidine.  She then took another dose of tizanidine and was then worried that she "took too many".  She was prescribed naloxone because of her chronic opiate use.  She states she then took her naloxone.  She began experiencing muscle spasms in all 4 extremities.  She states after arrival to the emergency department her symptoms have gradually alleviated.  She denies any complaints at this time.  No spasms, myalgias, chest pain, shortness of breath, abdominal pain, nausea, vomiting, diarrhea, urinary changes, syncope.     Past Medical History:  Diagnosis Date  . Arthritis   . Asthma   . History of GI bleed    2010--- UPPER ESOPHAGITIS/ DUODERAL ULCER/ EROSION  . History of hiatal hernia   . History of kidney stones   . History of shingles    2013  . Myocardial infarction ([place]) 10 years ago   . Nephrolithiasis    BILATERAL  . Right ureteral stone   . Type 2 diabetes, diet controlled ([place])   . Urgency of urination     Patient Active Problem List   Diagnosis Date Noted  . Nephrolithiasis 07/08/2017  . Abnormal EKG 06/18/2017  . Preop cardiovascular exam 06/18/2017  . History of myocardial infarction 06/18/2017  . Chronic joint pain 10/08/2016  . Asthma 10/08/2016  . History of kidney stones 10/08/2016  . Insomnia 10/08/2016  . Diabetes mellitus type 2, noninsulin dependent ([place]) 09/10/2013  . Obesity (BMI 30-39.9)  09/10/2013  . GERD 03/15/2009    Past Surgical History:  Procedure Laterality Date  . CARDIAC CATHETERIZATION  02-10-2009  DR BENSIMHON   NORMAL CORONARIES/  LOW NORMAL LVF WITHOUT WALL MOTION ABNORMALITIES  . CARDIAC CATHETERIZATION  11-21-2010  DR Einar Gip   NORMAL CORONARIES/  EF 50-55%  . CHOLECYSTECTOMY  1990s   back in [place]   . CYSTOSCOPY W/ URETERAL STENT PLACEMENT Right 09/04/2013   Procedure: CYSTOSCOPY WITH RETROGRADE PYELOGRAM/URETERAL STENT PLACEMENT;  Surgeon: Molli Hazard, MD;  Location: [facility];  Service: Urology;  Laterality: Right;  . CYSTOSCOPY WITH RETROGRADE PYELOGRAM, URETEROSCOPY AND STENT PLACEMENT Right 09/27/2013   Procedure: CYSTOSCOPY WITH RETROGRADE PYELOGRAM, URETEROSCOPY AND STENT EXCHANGE;  Surgeon: Molli Hazard, MD;  Location: [facility];  Service: Urology;  Laterality: Right;  . HOLMIUM LASER APPLICATION Right 4/0/9811   Procedure: HOLMIUM LASER APPLICATION;  Surgeon: Molli Hazard, MD;  Location: [facility];  Service: Urology;  Laterality: Right;  . HYSTEROSCOPY WITH D & C  01-25-2003  . IR URETERAL STENT RIGHT NEW ACCESS W/O SEP NEPHROSTOMY CATH  07/08/2017  . LAPAROSCOPIC ASSISTED VAGINAL HYSTERECTOMY  03-02-2003   W/  BILATERAL SALPINGOOPHORECTOMY  . LAPAROSCOPY  EXTENSIVE LYSIS ADHESIONS/  REDO PARAESOPHAGEAL HIATAL HERNIA WITH PRIMARY CLOSURE AND MESH/ NISSEN FUNDOPLICATION (1cm)/  REPAIR GASTROTOMY  08-22-2009  . NEPHROLITHOTOMY Right 07/08/2017   Procedure: NEPHROLITHOTOMY PERCUTANEOUS;  Surgeon: Junious Silk,  Rodman Key, MD;  Location: [facility];  Service: Urology;  Laterality: Right;  . NISSEN FUNDOPLICATION  0737   W/  CHOLECYSTECTOMY  . SHOULDER ARTHROSCOPY WITH SUBACROMIAL DECOMPRESSION, ROTATOR CUFF REPAIR AND BICEP TENDON REPAIR Left 08-02-2010   AND LABRAL DEBRIDEMENT  . TOTAL KNEE ARTHROPLASTY Right 07-01-2006  . TOTAL KNEE REVISION  02/25/2012   Procedure: TOTAL KNEE REVISION;  Surgeon: Meredith Pel, MD;  Location: [facility] [facility];  Service: Orthopedics;  Laterality: Right;  Revise right total knee arthroplasty  . TUBAL LIGATION  1990  . WRIST FUSION Left 2004   RETAINED HARDWARE     OB History   No obstetric history on file.     Family History  Problem Relation Age of Onset  . Heart disease Father     Social History   Tobacco Use  . Smoking status: Never Smoker  . Smokeless tobacco: Never Used  Vaping Use  . Vaping Use: Never used  Substance Use Topics  . Alcohol use: No  . Drug use: No    Home Medications Prior to Admission medications   Medication Sig Start Date End Date Taking? Authorizing Provider  albuterol (PROVENTIL HFA;VENTOLIN HFA) 108 (90 Base) MCG/ACT inhaler Inhale 2 puffs into the lungs every 4 (four) hours as needed for wheezing or shortness of breath. For shortness of breath 01/28/17  Yes Baity, Coralie Keens, NP  albuterol (PROVENTIL) (2.5 MG/3ML) 0.083% nebulizer solution Take 2.5 mg by nebulization every 6 (six) hours as needed for wheezing or shortness of breath.   Yes [provider]  aspirin EC 81 MG tablet Take 1 tablet (81 mg total) by mouth daily. 07/14/17  Yes Filippou, Braxton Feathers, MD  benzonatate (TESSALON) 200 MG capsule Take 1 capsule (200 mg total) by mouth 3 (three) times daily as needed for cough. 02/20/18  Yes Pleas Koch, NP  gabapentin (NEURONTIN) 600 MG tablet Take 600 mg by mouth 2 (two) times daily.   Yes [provider]  glipiZIDE (GLUCOTROL) 10 MG tablet TAKE 1 TABLET BY MOUTH TWICE DAILY BEFORE MEAL(S) Patient taking differently: Take 10 mg by mouth 2 (two) times daily before a meal.  01/07/18  Yes Baity, Coralie Keens, NP  insulin detemir (LEVEMIR FLEXTOUCH) 100 UNIT/ML FlexPen Inject 24 Units into the skin at bedtime.   Yes [provider]  meloxicam (MOBIC) 15 MG tablet Take 1 tablet (15 mg total) by mouth daily. 07/14/17  Yes Filippou, Braxton Feathers, MD  Multiple Vitamins-Minerals (ADVANCED DIABETIC MULTIVITAMIN  PO) Take 1 tablet by mouth daily.   Yes [provider]  oxyCODONE-acetaminophen (PERCOCET) 10-325 MG tablet Take 1 tablet by mouth 4 (four) times daily as needed. For breakthrough pain. 07/09/17  Yes Filippou, Braxton Feathers, MD  Semaglutide, 1 MG/DOSE, (OZEMPIC, 1 MG/DOSE,) 2 MG/1.5ML SOPN Inject 1 mg into the skin once a week. Saturday   Yes [provider]  tiZANidine (ZANAFLEX) 2 MG tablet Take 2 mg by mouth every 8 (eight) hours as needed for muscle spasms.  07/29/16  Yes [provider]  zolpidem (AMBIEN) 5 MG tablet Take 5 mg by mouth at bedtime. 08/01/16  Yes [provider]  blood glucose meter kit and supplies Dispense based on patient and insurance preference. Use up to four times daily as directed. (FOR ICD-10 E10.9, E11.9). 09/26/17   Elby Beck, FNP  docusate sodium (COLACE) 100 MG capsule Take 1 capsule (100 mg total) by mouth 2 (two) times daily. Patient not taking: Reported on 11/15/2019 07/09/17  Filippou, Braxton Feathers, MD  metFORMIN (GLUCOPHAGE) 1000 MG tablet TAKE 1 TABLET BY MOUTH TWICE DAILY WITH A MEAL Patient not taking: Reported on 11/15/2019 01/07/18   Jearld Fenton, NP  oxybutynin (DITROPAN) 5 MG tablet Take 1 tablet (5 mg total) by mouth every 8 (eight) hours as needed for bladder spasms. Patient not taking: Reported on 11/15/2019 07/09/17   Filippou, Braxton Feathers, MD  predniSONE (DELTASONE) 10 MG tablet Take 6 x 1 day, 5x1, 4x1, 3x1, 2x1, 1x1.Do not take meloxicam within 24 hours of taking prednisone. Patient not taking: Reported on 11/15/2019 09/26/17   Elby Beck, FNP  tamsulosin (FLOMAX) 0.4 MG CAPS capsule Take 1 capsule (0.4 mg total) by mouth daily. Patient not taking: Reported on 11/15/2019 05/29/17   Pleas Koch, NP    Allergies    Patient has no known allergies.  Review of Systems   Review of Systems  All other systems reviewed and are negative. Ten systems reviewed and are negative for acute change, except as noted in the  HPI.    Physical Exam Updated Vital Signs BP (!) 106/96 (BP Location: Right Arm)   Pulse 81   Temp 97.9 F (36.6 C) (Oral)   Resp 12   Ht '5\' 3"'  (1.6 m)   Wt 75.8 kg   SpO2 100%   BMI 29.58 kg/m   Physical Exam Vitals and nursing note reviewed.  Constitutional:      General: She is not in acute distress.    Appearance: Normal appearance. She is normal weight. She is not ill-appearing, toxic-appearing or diaphoretic.  HENT:     Head: Normocephalic and atraumatic.     Right Ear: External ear normal.     Left Ear: External ear normal.     Nose: Nose normal.     Mouth/Throat:     Mouth: Mucous membranes are moist.     Pharynx: Oropharynx is clear. No oropharyngeal exudate or posterior oropharyngeal erythema.  Eyes:     General: No scleral icterus.       Right eye: No discharge.        Left eye: No discharge.     Extraocular Movements: Extraocular movements intact.     Conjunctiva/sclera: Conjunctivae normal.     Pupils: Pupils are equal, round, and reactive to light.     Comments: Pupils are equal round and reactive to light.  Cardiovascular:     Rate and Rhythm: Normal rate and regular rhythm.     Pulses: Normal pulses.     Heart sounds: Normal heart sounds. No murmur heard.  No friction rub. No gallop.   Pulmonary:     Effort: Pulmonary effort is normal. No respiratory distress.     Breath sounds: Normal breath sounds. No stridor. No wheezing, rhonchi or rales.  Abdominal:     General: Abdomen is flat.     Tenderness: There is no abdominal tenderness.  Musculoskeletal:        General: Normal range of motion.     Cervical back: Normal range of motion and neck supple. No tenderness.     Right lower leg: No edema.     Left lower leg: No edema.  Skin:    General: Skin is warm and dry.  Neurological:     General: No focal deficit present.     Mental Status: She is alert and oriented to person, place, and time.     Comments: Patient is phonating clearly, coherently, in  complete sentences.  She is oriented to person, place, time.  Finger-to-nose intact bilaterally with no visible signs of dysmetria.  Negative pronator drift.  Extraocular movements intact.  Strength is 5 out of 5 in all 4 extremities.  Psychiatric:        Mood and Affect: Mood normal.        Behavior: Behavior normal.    ED Results / Procedures / Treatments   Labs (all labs ordered are listed, but only abnormal results are displayed) Labs Reviewed  BASIC METABOLIC PANEL - Abnormal; Notable for the following components:      Result Value   Glucose, Bld 123 (*)    All other components within normal limits  CBC WITH DIFFERENTIAL/PLATELET    EKG EKG Interpretation  Date/Time:  Monday November 15 2019 18:00:24 EDT Ventricular Rate:  77 PR Interval:    QRS Duration: 97 QT Interval:  395 QTC Calculation: 447 R Axis:     Text Interpretation: Sinus rhythm Multiple ventricular premature complexes No significant change since prior 6/19 Confirmed by Aletta Edouard [phone]) on 11/15/2019 6:02:16 PM  Radiology No results found.  Procedures Procedures   Medications Ordered in ED Medications - No data to display  ED Course  I have reviewed the triage vital signs and the nursing notes.  Pertinent labs & imaging results that were available during my care of the patient were reviewed by me and considered in my medical decision making (see chart for details).  Clinical Course as of Nov 14 1828  Mon Nov 14, 3749  4475 64 year old female on chronic narcotics for pain states she felt strange after taking her medication.  Concerned she might of taken too much medicine so she gave herself Narcan.  Then became very uncomfortable with spasms and withdrawal.  Symptoms are improving here.  Checking some basic electrolytes EKG.  Likely can discharge if continues to improve.   [MB]    Clinical Course User Index [MB] Hayden Rasmussen, MD   MDM Rules/Calculators/A&P                          Patient  is a 64 year old female who presents due to a possible overdose.  Since taking naloxone earlier today patient states she feels back to her baseline.  Due to her age and medical history, obtain basic labs as well as an EKG.  All of these were reassuring.  I discussed this with the patient.  We discussed when to properly take this medication.  We additionally discussed safety regarding all of her regular pain medications.  Her questions were answered and she was amicable the time of discharge.  Her vital signs are stable.  Patient discharged to home/self care.  Condition at discharge: Stable  Note: Portions of this report may have been transcribed using voice recognition software. Every effort was made to ensure accuracy; however, inadvertent computerized transcription errors may be present.    Final Clinical Impression(s) / ED Diagnoses Final diagnoses:  Muscle spasm   Rx / DC Orders ED Discharge Orders    None       Rayna Sexton, PA-C 11/15/19 1915    Hayden Rasmussen, MD 11/16/19 1423

## 2020-02-03 ENCOUNTER — Ambulatory Visit: Payer: Medicaid Other | Admitting: Surgical

## 2020-03-08 ENCOUNTER — Telehealth: Payer: Self-pay

## 2020-03-08 ENCOUNTER — Ambulatory Visit: Payer: Medicaid Other | Admitting: Neurology

## 2020-03-08 ENCOUNTER — Encounter: Payer: Self-pay | Admitting: Neurology

## 2020-03-08 NOTE — Telephone Encounter (Signed)
Pt no showed 03/08/2020 appt with Dr. Athar. 

## 2020-03-16 ENCOUNTER — Ambulatory Visit: Payer: Medicaid Other | Admitting: Orthopedic Surgery

## 2020-03-24 ENCOUNTER — Ambulatory Visit: Payer: Medicaid Other | Admitting: Orthopedic Surgery

## 2020-04-05 ENCOUNTER — Ambulatory Visit: Payer: Self-pay

## 2020-04-05 ENCOUNTER — Ambulatory Visit (INDEPENDENT_AMBULATORY_CARE_PROVIDER_SITE_OTHER): Payer: Medicare Other | Admitting: Orthopedic Surgery

## 2020-04-05 VITALS — Ht 63.0 in | Wt 163.0 lb

## 2020-04-05 DIAGNOSIS — M1712 Unilateral primary osteoarthritis, left knee: Secondary | ICD-10-CM | POA: Diagnosis not present

## 2020-04-05 DIAGNOSIS — Z96651 Presence of right artificial knee joint: Secondary | ICD-10-CM

## 2020-04-05 DIAGNOSIS — M25562 Pain in left knee: Secondary | ICD-10-CM | POA: Diagnosis not present

## 2020-04-05 DIAGNOSIS — M25561 Pain in right knee: Secondary | ICD-10-CM

## 2020-04-09 ENCOUNTER — Encounter: Payer: Self-pay | Admitting: Orthopedic Surgery

## 2020-04-09 NOTE — Progress Notes (Signed)
Office Visit Note   Patient: [name]           Date of Birth: Oct 14, 1955           MRN: [id number] Visit Date: 04/05/2020 Requested by: Courtney Paris, NP [phone] [address] [place],  [place] [address] PCP: Courtney Paris, NP  Subjective: Chief Complaint  Patient presents with  . Left Knee - Pain  . Right Knee - Pain    HPI: [name] is a 64 y.o. female who presents to the office complaining of bilateral knee pain.  She notes knee pain over the last 6 months.  She has history of right knee surgery with revision surgery with last surgery in 2013.  Left knee bothers her about as much as the right knee.  Denies any fevers, chills, night sweats.  Pain does wake her up at night.  She notes difficulty walking due to pain and instability when she walks on occasion.  She is retired.  She goes to pain management where she receives oxycodone 10 mg that she takes 3 times per day as needed as well as gabapentin.  Pain is worse with walking.  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  She has a history of diabetes and her last A1c was around 6..                ROS: All systems reviewed are negative as they relate to the chief complaint within the history of present illness.  Patient denies fevers or chills.  Assessment & Plan: Visit Diagnoses:  1. Pain in both knees, unspecified chronicity     Plan: Patient is a 64 year old female presents complaint of bilateral knee pain.  She has had increasing pain over the last 6 months.  She does have a history of knee replacement with revision surgery of the right knee.  Radiographs reviewed with the patient.  She has bone-on-bone arthritic changes of the left knee, particularly worse in the medial compartment.  Right knee prosthesis looks to be in appropriate position without any evidence of loosening or periprosthetic fracture.  Discussed options available to patient.  Discussion of operative  and nonoperative treatment options at length were done with [facility] today.  In general Alfonso is interested in more permanent solution to her left knee pain.  She has progressive knee arthritis interfering with activities of daily living as well as rest pain and night pain.  I think total knee replacement is a good option for her at this time.  Risk benefits are discussed including not limited to infection nerve damage loosening knee stiffness as well as incomplete pain relief.  Anticipate improved implant longevity compared to her right total knee replacement.  All questions answered Follow-Up Instructions: No follow-ups on file.   Orders:  Orders Placed This Encounter  Procedures  . XR Knee 1-2 Views Right  . XR KNEE 3 VIEW LEFT   No orders of the defined types were placed in this encounter.     Procedures: No procedures performed   Clinical Data: No additional findings.  Objective: Vital Signs: Ht 5\' 3"  (1.6 m)   Wt 163 lb (73.9 kg)   BMI 28.87 kg/m   Physical Exam:  Constitutional: Patient appears well-developed HEENT:  Head: Normocephalic Eyes:EOM are normal Neck: Normal range of motion Cardiovascular: Normal rate Pulmonary/chest: Effort normal Neurologic: Patient is alert Skin: Skin is warm Psychiatric: Patient has  normal mood and affect  Ortho Exam: Ortho exam demonstrates bilateral knees without effusion.  Left knee with tenderness over the medial joint line.  Right knee without any joint line tenderness.  Able to perform straight leg raise with both knees.  No calf tenderness on either side.  Negative Homans' sign bilaterally.  Mild groin pain elicited with hip range of motion but no knee pain with hip range of motion.  5/5 motor strength of the bilateral hip flexors, quadricep, hamstring, dorsiflexion, plantarflexion.  Left knee range of motion 5 degree flexion contracture to 115 of flexion.  Specialty Comments:  No specialty comments available.  Imaging: No results  found.   PMFS History: Patient Active Problem List   Diagnosis Date Noted  . Nephrolithiasis 07/08/2017  . Abnormal EKG 06/18/2017  . Preop cardiovascular exam 06/18/2017  . History of myocardial infarction 06/18/2017  . Chronic joint pain 10/08/2016  . Asthma 10/08/2016  . History of kidney stones 10/08/2016  . Insomnia 10/08/2016  . Diabetes mellitus type 2, noninsulin dependent ([facility]) 09/10/2013  . Obesity (BMI 30-39.9) 09/10/2013  . GERD 03/15/2009   Past Medical History:  Diagnosis Date  . Arthritis   . Asthma   . History of GI bleed    2010--- UPPER ESOPHAGITIS/ DUODERAL ULCER/ EROSION  . History of hiatal hernia   . History of kidney stones   . History of shingles    2013  . Myocardial infarction ([facility]) 10 years ago   . Nephrolithiasis    BILATERAL  . Right ureteral stone   . Type 2 diabetes, diet controlled ([facility])   . Urgency of urination     Family History  Problem Relation Age of Onset  . Heart disease Father     Past Surgical History:  Procedure Laterality Date  . CARDIAC CATHETERIZATION  02-10-2009  DR BENSIMHON   NORMAL CORONARIES/  LOW NORMAL LVF WITHOUT WALL MOTION ABNORMALITIES  . CARDIAC CATHETERIZATION  11-21-2010  DR Jacinto Halim   NORMAL CORONARIES/  EF 50-55%  . CHOLECYSTECTOMY  1990s   back in [place]   . CYSTOSCOPY W/ URETERAL STENT PLACEMENT Right 09/04/2013   Procedure: CYSTOSCOPY WITH RETROGRADE PYELOGRAM/URETERAL STENT PLACEMENT;  Surgeon: Milford Cage, MD;  [facility];  Service: Urology;  Laterality: Right;  . CYSTOSCOPY WITH RETROGRADE PYELOGRAM, URETEROSCOPY AND STENT PLACEMENT Right 09/27/2013   Procedure: CYSTOSCOPY WITH RETROGRADE PYELOGRAM, URETEROSCOPY AND STENT EXCHANGE;  Surgeon: Milford Cage, MD;  Location: [facility];  Service: Urology;  Laterality: Right;  . HOLMIUM LASER APPLICATION Right 09/27/2013   Procedure: HOLMIUM LASER APPLICATION;  Surgeon: Milford Cage, MD;  Location: [facility];  Service: Urology;  Laterality: Right;  . HYSTEROSCOPY WITH D & C  01-25-2003  . IR URETERAL STENT RIGHT NEW ACCESS W/O SEP NEPHROSTOMY CATH  07/08/2017  . LAPAROSCOPIC ASSISTED VAGINAL HYSTERECTOMY  03-02-2003   W/  BILATERAL SALPINGOOPHORECTOMY  . LAPAROSCOPY  EXTENSIVE LYSIS ADHESIONS/  REDO PARAESOPHAGEAL HIATAL HERNIA WITH PRIMARY CLOSURE AND MESH/ NISSEN FUNDOPLICATION (1cm)/  REPAIR GASTROTOMY  08-22-2009  . NEPHROLITHOTOMY Right 07/08/2017   Procedure: NEPHROLITHOTOMY PERCUTANEOUS;  Surgeon: Jerilee Field, MD;  Location[facility];  Service: Urology;  Laterality: Right;  . NISSEN FUNDOPLICATION  2000   W/  CHOLECYSTECTOMY  . SHOULDER ARTHROSCOPY WITH SUBACROMIAL DECOMPRESSION, ROTATOR CUFF REPAIR AND BICEP TENDON REPAIR Left 08-02-2010   AND LABRAL DEBRIDEMENT  . TOTAL KNEE ARTHROPLASTY Right 07-01-2006  . TOTAL KNEE REVISION  02/25/2012   Procedure: TOTAL  KNEE REVISION;  Surgeon: Cammy Copa, MD;  Location: [facility];  Service: Orthopedics;  Laterality: Right;  Revise right total knee arthroplasty  . TUBAL LIGATION  1990  . WRIST FUSION Left 2004   RETAINED HARDWARE   Social History   Occupational History  . Not on file  Tobacco Use  . Smoking status: Never Smoker  . Smokeless tobacco: Never Used  Vaping Use  . Vaping Use: Never used  Substance and Sexual Activity  . Alcohol use: No  . Drug use: No  . Sexual activity: Not on file

## 2020-04-12 ENCOUNTER — Encounter: Payer: Self-pay | Admitting: Orthopedic Surgery

## 2020-05-30 NOTE — Progress Notes (Addendum)
COVID Vaccine Completed:  x3 Date COVID Vaccine completed:  08-24-19 09-16-19 05-02-20 COVID vaccine manufacturer: Pfizer       PCP - Courtney Paris, NP Cardiologist - Belva Crome, MD.  Last OV 06-18-17  Chest x-ray -  EKG - 11-15-19 in Epic Stress Test - 06-20-2017 in Epic ECHO -  Cardiac Cath -  Pacemaker/ICD device last checked:  Sleep Study - N/A CPAP -   Fasting Blood Sugar - 120 to 400 (rare) Checks Blood Sugar -  3 tiimes a day  Blood Thinner Instructions: Aspirin Instructions:  ASA 81 mg  Last Dose:  Anesthesia review:  Hx of MI, DM  Patient denies shortness of breath, fever, cough and chest pain at PAT appointment. Pt unable to climb a flight of stairs due to knee and hip pain.  Can do some housework, some difficulty due to joint pain.   Patient verbalized understanding of instructions that were given to them at the PAT appointment. Patient was also instructed that they will need to review over the PAT instructions again at home before surgery.

## 2020-05-31 ENCOUNTER — Other Ambulatory Visit: Payer: Self-pay

## 2020-05-31 NOTE — Patient Instructions (Addendum)
DUE TO COVID-19 ONLY ONE VISITOR IS ALLOWED TO COME WITH YOU AND STAY IN THE WAITING ROOM ONLY DURING PRE OP AND PROCEDURE.   IF YOU WILL BE ADMITTED INTO THE HOSPITAL YOU ARE ALLOWED ONE SUPPORT PERSON DURING VISITATION HOURS ONLY (10AM -8PM)   . The support person may change daily. . The support person must pass our screening, gel in and out, and wear a mask at all times, including in the patient's room. . Patients must also wear a mask when staff or their support person are in the room.   COVID SWAB TESTING MUST BE COMPLETED ON:   Monday, 06-05-20 @ 10:00 AM   [address] [place], [place] [address]  (Must self quarantine after testing. Follow instructions on handout.)    Your procedure is scheduled on:  Thursday, 06-08-20   Report to [facility] Main  Entrance   Report to admitting at 9:45 AM   Call this number if you have problems the morning of surgery [phone]   Do not eat food :After Midnight.   May have liquids until 9:15 AM  day of surgery  CLEAR LIQUID DIET  Foods Allowed                                                                     Foods Excluded  Water, Black Coffee and tea, regular and decaf              liquids that you cannot  Plain Jell-O in any flavor  (No red)                                     see through such as: Fruit ices (not with fruit pulp)                                      milk, soups, orange juice              Iced Popsicles (No red)                                      All solid food                                   Apple juices Sports drinks like Gatorade (No red) Lightly seasoned clear broth or consume(fat free) Sugar, honey syrup     Oral Hygiene is also important to reduce your risk of infection.                                    Remember - BRUSH YOUR TEETH THE MORNING OF SURGERY WITH YOUR REGULAR TOOTHPASTE   Do NOT smoke after Midnight   Take these medicines the morning of surgery with A SIP OF WATER: Gabapentin,  Abilify, Okay to use inhalers  How to Manage Your Diabetes Before and After Surgery  Why is it important to control my blood sugar before and after surgery? . Improving blood sugar levels before and after surgery helps healing and can limit problems. . A way of improving blood sugar control is eating a healthy diet by: o  Eating less sugar and carbohydrates o  Increasing activity/exercise o  Talking with your doctor about reaching your blood sugar goals . High blood sugars (greater than 180 mg/dL) can raise your risk of infections and slow your recovery, so you will need to focus on controlling your diabetes during the weeks before surgery. . Make sure that the doctor who takes care of your diabetes knows about your planned surgery including the date and location.  How do I manage my blood sugar before surgery? . Check your blood sugar at least 4 times a day, starting 2 days before surgery, to make sure that the level is not too high or low. o Check your blood sugar the morning of your surgery when you wake up and every 2 hours until you get to the Short Stay unit. . If your blood sugar is less than 70 mg/dL, you will need to treat for low blood sugar: o Do not take insulin. o Treat a low blood sugar (less than 70 mg/dL) with  cup of clear juice (cranberry or apple), 4 glucose tablets, OR glucose gel. o Recheck blood sugar in 15 minutes after treatment (to make sure it is greater than 70 mg/dL). If your blood sugar is not greater than 70 mg/dL on recheck, call [phone] for further instructions. . Report your blood sugar to the short stay nurse when you get to Short Stay.  . If you are admitted to the hospital after surgery: o Your blood sugar will be checked by the staff and you will probably be given insulin after surgery (instead of oral diabetes medicines) to make sure you have good blood sugar levels. o The goal for blood sugar control after surgery is 80-180  mg/dL.   WHAT DO I DO ABOUT MY DIABETES MEDICATION?  Marland Kitchen Do not take oral diabetes medicines (pills) the morning of surgery.  . THE DAY BEFORE SURGERY:  Take Glipizide as prescribed       Take 12 units of Levemir at bedtime       . THE MORNING OF SURGERY:  Do not take Glipizide.    Reviewed and Endorsed by [facility] Patient Education Committee, August 2015              You may not have any metal on your body including hair pins, jewelry, and body piercings             Do not wear make-up, lotions, powders, perfumes/cologne, or deodorant             Do not wear nail polish.  Do not shave  48 hours prior to surgery.             Do not bring valuables to the hospital. [facility] IS NOT  RESPONSIBLE   FOR VALUABLES.   Contacts, dentures or bridgework may not be worn into surgery.   Bring small overnight bag day of surgery.                 Please read over the following fact sheets you were given: IF YOU HAVE QUESTIONS ABOUT YOUR PRE OP INSTRUCTIONS PLEASE CALL [phone]   [facility] - Preparing for  Surgery Before surgery, you can play an important role.  Because skin is not sterile, your skin needs to be as free of germs as possible.  You can reduce the number of germs on your skin by washing with CHG (chlorahexidine gluconate) soap before surgery.  CHG is an antiseptic cleaner which kills germs and bonds with the skin to continue killing germs even after washing. Please DO NOT use if you have an allergy to CHG or antibacterial soaps.  If your skin becomes reddened/irritated stop using the CHG and inform your nurse when you arrive at Short Stay. Do not shave (including legs and underarms) for at least 48 hours prior to the first CHG shower.  You may shave your face/neck.  Please follow these instructions carefully:  1.  Shower with CHG Soap the night before surgery and the  morning of surgery.  2.  If you choose to wash your hair, wash your hair first as usual with your normal   shampoo.  3.  After you shampoo, rinse your hair and body thoroughly to remove the shampoo.                             4.  Use CHG as you would any other liquid soap.  You can apply chg directly to the skin and wash.  Gently with a scrungie or clean washcloth.  5.  Apply the CHG Soap to your body ONLY FROM THE NECK DOWN.   Do   not use on face/ open                           Wound or open sores. Avoid contact with eyes, ears mouth and   genitals (private parts).                       Wash face,  Genitals (private parts) with your normal soap.             6.  Wash thoroughly, paying special attention to the area where your    surgery  will be performed.  7.  Thoroughly rinse your body with warm water from the neck down.  8.  DO NOT shower/wash with your normal soap after using and rinsing off the CHG Soap.                9.  Pat yourself dry with a clean towel.            10.  Wear clean pajamas.            11.  Place clean sheets on your bed the night of your first shower and do not  sleep with pets. Day of Surgery : Do not apply any lotions/deodorants the morning of surgery.  Please wear clean clothes to the hospital/surgery center.  FAILURE TO FOLLOW THESE INSTRUCTIONS MAY RESULT IN THE CANCELLATION OF YOUR SURGERY  PATIENT SIGNATURE_________________________________  NURSE SIGNATURE__________________________________  ________________________________________________________________________

## 2020-05-31 NOTE — Progress Notes (Signed)
Please enter orders for PAT visit scheduled for 06-02-20.

## 2020-06-02 ENCOUNTER — Encounter (HOSPITAL_COMMUNITY)
Admission: RE | Admit: 2020-06-02 | Discharge: 2020-06-02 | Disposition: A | Discharge status: Home / Self Care | Payer: Medicare HMO | Source: Ambulatory Visit | Attending: Orthopedic Surgery | Admitting: Orthopedic Surgery

## 2020-06-02 ENCOUNTER — Other Ambulatory Visit: Payer: Self-pay

## 2020-06-02 ENCOUNTER — Encounter (HOSPITAL_COMMUNITY): Payer: Self-pay

## 2020-06-02 DIAGNOSIS — Z79899 Other long term (current) drug therapy: Secondary | ICD-10-CM | POA: Diagnosis not present

## 2020-06-02 DIAGNOSIS — Z01812 Encounter for preprocedural laboratory examination: Secondary | ICD-10-CM | POA: Diagnosis present

## 2020-06-02 DIAGNOSIS — M1712 Unilateral primary osteoarthritis, left knee: Secondary | ICD-10-CM | POA: Diagnosis not present

## 2020-06-02 DIAGNOSIS — Z794 Long term (current) use of insulin: Secondary | ICD-10-CM | POA: Insufficient documentation

## 2020-06-02 DIAGNOSIS — J45909 Unspecified asthma, uncomplicated: Secondary | ICD-10-CM | POA: Diagnosis not present

## 2020-06-02 DIAGNOSIS — E119 Type 2 diabetes mellitus without complications: Secondary | ICD-10-CM | POA: Insufficient documentation

## 2020-06-02 DIAGNOSIS — Z7982 Long term (current) use of aspirin: Secondary | ICD-10-CM | POA: Diagnosis not present

## 2020-06-02 DIAGNOSIS — I252 Old myocardial infarction: Secondary | ICD-10-CM | POA: Insufficient documentation

## 2020-06-02 HISTORY — DX: Depression, unspecified: F32.A

## 2020-06-02 HISTORY — DX: Cardiac arrhythmia, unspecified: I49.9

## 2020-06-02 HISTORY — DX: Anxiety disorder, unspecified: F41.9

## 2020-06-02 LAB — BASIC METABOLIC PANEL
Anion gap: 10 (ref 5–15)
BUN: 24 mg/dL — ABNORMAL HIGH (ref 8–23)
CO2: 30 mmol/L (ref 22–32)
Calcium: 9.6 mg/dL (ref 8.9–10.3)
Chloride: 103 mmol/L (ref 98–111)
Creatinine, Ser: 0.65 mg/dL (ref 0.44–1.00)
GFR, Estimated: >60 mL/min (ref >60)
Glucose, Bld: 95 mg/dL (ref 70–99)
Potassium: 4.8 mmol/L (ref 3.5–5.1)
Sodium: 143 mmol/L (ref 135–145)

## 2020-06-02 LAB — CBC
HCT: 38.1 % (ref 36.0–46.0)
Hemoglobin: 11.6 g/dL — ABNORMAL LOW (ref 12.0–15.0)
MCH: 27.5 pg (ref 26.0–34.0)
MCHC: 30.4 g/dL (ref 30.0–36.0)
MCV: 90.3 fL (ref 80.0–100.0)
Platelets: 233 10*3/uL (ref 150–400)
RBC: 4.22 MIL/uL (ref 3.87–5.11)
RDW: 14.3 % (ref 11.5–15.5)
WBC: 7.2 10*3/uL (ref 4.0–10.5)
nRBC: 0 % (ref 0.0–0.2)

## 2020-06-02 LAB — GLUCOSE, CAPILLARY: Glucose-Capillary: 95 mg/dL (ref 70–99)

## 2020-06-02 LAB — SURGICAL PCR SCREEN
MRSA, PCR: NEGATIVE
Staphylococcus aureus: NEGATIVE

## 2020-06-02 LAB — HEMOGLOBIN A1C
Hgb A1c MFr Bld: 6.5 % — ABNORMAL HIGH (ref 4.8–5.6)
Mean Plasma Glucose: 139.85 mg/dL

## 2020-06-05 ENCOUNTER — Other Ambulatory Visit (HOSPITAL_COMMUNITY)
Admission: RE | Admit: 2020-06-05 | Discharge: 2020-06-05 | Disposition: A | Discharge status: Home / Self Care | Payer: Medicare HMO | Source: Ambulatory Visit | Attending: Orthopedic Surgery | Admitting: Orthopedic Surgery

## 2020-06-05 DIAGNOSIS — Z01812 Encounter for preprocedural laboratory examination: Secondary | ICD-10-CM | POA: Insufficient documentation

## 2020-06-05 DIAGNOSIS — Z20822 Contact with and (suspected) exposure to covid-19: Secondary | ICD-10-CM | POA: Insufficient documentation

## 2020-06-05 LAB — SARS CORONAVIRUS 2 (TAT 6-24 HRS): SARS Coronavirus 2: NEGATIVE

## 2020-06-05 NOTE — Progress Notes (Signed)
Anesthesia Chart Review   Case: [id number] Date/Time: 06/08/20 1200   Procedure: LEFT TOTAL KNEE ARTHROPLASTY (Left Knee)   Anesthesia type: Spinal   Pre-op diagnosis: left knee osteoarthritis   Location: [facility] ROOM 09 / [facility]   Surgeons: Meredith Pel, MD      DISCUSSION:65 y.o. never smoker with h/o asthma, DM II, MI, left knee OA scheduled for above procedure 06/08/20 with Dr. Marcene Duos.   Seen by cardiology 06/18/17 for preoperative evaluation.  Per OV note, "She has a past medical history of myocardial infarction about which much details are unknown she says that this happened about 10 years ago and cannot give me any details.  Cardiac cath 11/23/2010 with normal coronaries. Low risk stress test 06/20/2017.   Discussed with Dr. Smith Robert.  Anticipate pt can proceed with planned procedure barring acute status change.   VS: BP 138/63   Pulse 82   Temp 36.5 C (Oral)   Resp 16   Ht '5\' 3"'  (1.6 m)   Wt 75.8 kg   SpO2 97%   BMI 29.58 kg/m   PROVIDERS: Simona Huh, NP is PCP   Jyl Heinz, MD is Cardiologist  LABS: Labs reviewed: Acceptable for surgery. (all labs ordered are listed, but only abnormal results are displayed)  Labs Reviewed  BASIC METABOLIC PANEL - Abnormal; Notable for the following components:      Result Value   BUN 24 (*)    All other components within normal limits  CBC - Abnormal; Notable for the following components:   Hemoglobin 11.6 (*)    All other components within normal limits  HEMOGLOBIN A1C - Abnormal; Notable for the following components:   Hgb A1c MFr Bld 6.5 (*)    All other components within normal limits  SURGICAL PCR SCREEN  GLUCOSE, CAPILLARY     IMAGES:   EKG: 11/15/19 Rate 77 bpm  Sinus rhythm multiple ventricular premature complexes No significant change since prior   CV: Stress Test 06/21/2017  The study is normal.  This is a low risk study.  The study could not be gated so there is no information about the left  ventricular function.   Past Medical History:  Diagnosis Date  . Anxiety   . Arthritis   . Asthma   . Depression   . Dysrhythmia   . History of GI bleed    2010--- UPPER ESOPHAGITIS/ DUODERAL ULCER/ EROSION  . History of hiatal hernia   . History of kidney stones   . History of shingles    2013  . Myocardial infarction ([place]) 10 years ago   . Nephrolithiasis    BILATERAL  . Right ureteral stone   . Type 2 diabetes, diet controlled ([place])   . Urgency of urination     Past Surgical History:  Procedure Laterality Date  . CARDIAC CATHETERIZATION  02-10-2009  DR BENSIMHON   NORMAL CORONARIES/  LOW NORMAL LVF WITHOUT WALL MOTION ABNORMALITIES  . CARDIAC CATHETERIZATION  11-21-2010  DR Einar Gip   NORMAL CORONARIES/  EF 50-55%  . CHOLECYSTECTOMY  1990s   back in [place]   . CYSTOSCOPY W/ URETERAL STENT PLACEMENT Right 09/04/2013   Procedure: CYSTOSCOPY WITH RETROGRADE PYELOGRAM/URETERAL STENT PLACEMENT;  Surgeon: Molli Hazard, MD;  Location: [facility];  Service: Urology;  Laterality: Right;  . CYSTOSCOPY WITH RETROGRADE PYELOGRAM, URETEROSCOPY AND STENT PLACEMENT Right 09/27/2013   Procedure: CYSTOSCOPY WITH RETROGRADE PYELOGRAM, URETEROSCOPY AND STENT EXCHANGE;  Surgeon: Molli Hazard, MD;  Location: [facility];  Service: Urology;  Laterality: Right;  . HOLMIUM LASER APPLICATION Right 12/03/238   Procedure: HOLMIUM LASER APPLICATION;  Surgeon: Molli Hazard, MD;  [facility];  Service: Urology;  Laterality: Right;  . HYSTEROSCOPY WITH D & C  01-25-2003  . IR URETERAL STENT RIGHT NEW ACCESS W/O SEP NEPHROSTOMY CATH  07/08/2017  . LAPAROSCOPIC ASSISTED VAGINAL HYSTERECTOMY  03-02-2003   W/  BILATERAL SALPINGOOPHORECTOMY  . LAPAROSCOPY  EXTENSIVE LYSIS ADHESIONS/  REDO PARAESOPHAGEAL HIATAL HERNIA WITH PRIMARY CLOSURE AND MESH/ NISSEN FUNDOPLICATION (1cm)/  REPAIR GASTROTOMY  08-22-2009  . NEPHROLITHOTOMY Right 07/08/2017    Procedure: NEPHROLITHOTOMY PERCUTANEOUS;  Surgeon: Festus Aloe, MD;  Location[facility];  Service: Urology;  Laterality: Right;  . NISSEN FUNDOPLICATION  9735   W/  CHOLECYSTECTOMY  . SHOULDER ARTHROSCOPY WITH SUBACROMIAL DECOMPRESSION, ROTATOR CUFF REPAIR AND BICEP TENDON REPAIR Left 08-02-2010   AND LABRAL DEBRIDEMENT  . TOTAL KNEE ARTHROPLASTY Right 07-01-2006  . TOTAL KNEE REVISION  02/25/2012   Procedure: TOTAL KNEE REVISION;  Surgeon: Meredith Pel, MD;  Location: [facility];  Service: Orthopedics;  Laterality: Right;  Revise right total knee arthroplasty  . TUBAL LIGATION  1990  . WRIST FUSION Left 2004   RETAINED HARDWARE    MEDICATIONS: . albuterol (PROVENTIL HFA;VENTOLIN HFA) 108 (90 Base) MCG/ACT inhaler  . albuterol (PROVENTIL) (2.5 MG/3ML) 0.083% nebulizer solution  . amitriptyline (ELAVIL) 10 MG tablet  . ARIPiprazole (ABILIFY) 5 MG tablet  . aspirin EC 81 MG tablet  . blood glucose meter kit and supplies  . gabapentin (NEURONTIN) 800 MG tablet  . glipiZIDE (GLUCOTROL) 5 MG tablet  . insulin detemir (LEVEMIR FLEXTOUCH) 100 UNIT/ML FlexPen  . Magnesium 500 MG TABS  . Multiple Vitamins-Minerals (ADVANCED DIABETIC MULTIVITAMIN PO)  . oxyCODONE-acetaminophen (PERCOCET) 10-325 MG tablet  . OZEMPIC, 1 MG/DOSE, 4 MG/3ML SOPN  . Semaglutide, 1 MG/DOSE, (OZEMPIC, 1 MG/DOSE,) 2 MG/1.5ML SOPN  . sertraline (ZOLOFT) 100 MG tablet  . tizanidine (ZANAFLEX) 6 MG capsule  . vitamin B-12 (CYANOCOBALAMIN) 1000 MCG tablet  . zolpidem (AMBIEN) 5 MG tablet   No current facility-administered medications for this encounter.    Konrad Felix, PA-C [facility] [phone]

## 2020-06-07 MED ORDER — TRANEXAMIC ACID 1000 MG/10ML IV SOLN
2000.0000 mg | INTRAVENOUS | Status: DC
  Filled 2020-06-07: qty 20

## 2020-06-07 NOTE — Anesthesia Preprocedure Evaluation (Addendum)
Anesthesia Evaluation  Patient identified by MRN, date of birth, ID band Patient awake    Reviewed: Allergy & Precautions, NPO status , Patient's Chart, lab work & pertinent test results  Airway Mallampati: II  TM Distance: >3 FB Neck ROM: Full    Dental no notable dental hx.    Pulmonary asthma ,    Pulmonary exam normal breath sounds clear to auscultation       Cardiovascular + Past MI  Normal cardiovascular exam Rhythm:Regular Rate:Normal  EKG: 11/15/19 Rate 77 bpm  Sinus rhythm multiple ventricular premature complexes No significant change since prior   CV: Stress Test 06/21/2017 The study is normal. This is a low risk study. The study could not be gated so there is no information about the left ventricular function    Neuro/Psych PSYCHIATRIC DISORDERS Anxiety Depression negative neurological ROS     GI/Hepatic Neg liver ROS, hiatal hernia, GERD  Controlled,  Endo/Other  negative endocrine ROSdiabetes, Type 2, Oral Hypoglycemic Agents, Insulin Dependent  Renal/GU negative Renal ROS  negative genitourinary   Musculoskeletal  (+) Arthritis ,   Abdominal   Peds  Hematology negative hematology ROS (+)   Anesthesia Other Findings   Reproductive/Obstetrics                           Anesthesia Physical Anesthesia Plan  ASA: II  Anesthesia Plan: Spinal and Regional   Post-op Pain Management:    Induction:   PONV Risk Score and Plan: 2 and Treatment may vary due to age or medical condition, Propofol infusion, Ondansetron and Midazolam  Airway Management Planned: Natural Airway  Additional Equipment:   Intra-op Plan:   Post-operative Plan:   Informed Consent: I have reviewed the patients History and Physical, chart, labs and discussed the procedure including the risks, benefits and alternatives for the proposed anesthesia with the patient or authorized representative who  has indicated his/her understanding and acceptance.     Dental advisory given  Plan Discussed with: CRNA  Anesthesia Plan Comments:        Anesthesia Quick Evaluation

## 2020-06-08 ENCOUNTER — Ambulatory Visit (HOSPITAL_COMMUNITY): Payer: Medicare HMO | Admitting: Anesthesiology

## 2020-06-08 ENCOUNTER — Ambulatory Visit (HOSPITAL_COMMUNITY): Payer: Medicare HMO | Admitting: Physician Assistant

## 2020-06-08 ENCOUNTER — Other Ambulatory Visit: Payer: Self-pay

## 2020-06-08 ENCOUNTER — Encounter (HOSPITAL_COMMUNITY): Payer: Self-pay | Admitting: Orthopedic Surgery

## 2020-06-08 ENCOUNTER — Observation Stay (HOSPITAL_COMMUNITY)
Admission: RE | Admit: 2020-06-08 | Discharge: 2020-06-12 | Disposition: A | Discharge status: Home / Self Care | Payer: Medicare HMO | Source: Ambulatory Visit | Attending: Orthopedic Surgery | Admitting: Orthopedic Surgery

## 2020-06-08 ENCOUNTER — Encounter (HOSPITAL_COMMUNITY)
Admission: RE | Disposition: A | Discharge status: Home / Self Care | Payer: Self-pay | Source: Ambulatory Visit | Attending: Orthopedic Surgery

## 2020-06-08 DIAGNOSIS — E119 Type 2 diabetes mellitus without complications: Secondary | ICD-10-CM | POA: Diagnosis not present

## 2020-06-08 DIAGNOSIS — Z96652 Presence of left artificial knee joint: Secondary | ICD-10-CM

## 2020-06-08 DIAGNOSIS — M1712 Unilateral primary osteoarthritis, left knee: Principal | ICD-10-CM | POA: Insufficient documentation

## 2020-06-08 DIAGNOSIS — J45909 Unspecified asthma, uncomplicated: Secondary | ICD-10-CM | POA: Diagnosis not present

## 2020-06-08 DIAGNOSIS — Z96651 Presence of right artificial knee joint: Secondary | ICD-10-CM | POA: Diagnosis not present

## 2020-06-08 DIAGNOSIS — M25562 Pain in left knee: Secondary | ICD-10-CM | POA: Diagnosis present

## 2020-06-08 HISTORY — DX: Presence of left artificial knee joint: Z96.652

## 2020-06-08 HISTORY — PX: TOTAL KNEE ARTHROPLASTY: SHX125

## 2020-06-08 LAB — URINALYSIS, ROUTINE W REFLEX MICROSCOPIC
Bilirubin Urine: NEGATIVE
Glucose, UA: NEGATIVE mg/dL
Hgb urine dipstick: NEGATIVE
Ketones, ur: NEGATIVE mg/dL
Nitrite: NEGATIVE
Protein, ur: NEGATIVE mg/dL
Specific Gravity, Urine: 1.013 (ref 1.005–1.030)
pH: 6 (ref 5.0–8.0)

## 2020-06-08 LAB — GLUCOSE, CAPILLARY
Glucose-Capillary: 105 mg/dL — ABNORMAL HIGH (ref 70–99)
Glucose-Capillary: 143 mg/dL — ABNORMAL HIGH (ref 70–99)
Glucose-Capillary: 243 mg/dL — ABNORMAL HIGH (ref 70–99)

## 2020-06-08 SURGERY — ARTHROPLASTY, KNEE, TOTAL
Anesthesia: Regional | Site: Knee | Laterality: Left

## 2020-06-08 MED ORDER — POVIDONE-IODINE 10 % EX SWAB
2.0000 "application " | Freq: Once | CUTANEOUS | Status: AC
  Administered 2020-06-08: 2 via TOPICAL

## 2020-06-08 MED ORDER — PHENYLEPHRINE HCL (PRESSORS) 10 MG/ML IV SOLN
INTRAVENOUS | Status: AC
  Filled 2020-06-08: qty 1

## 2020-06-08 MED ORDER — ACETAMINOPHEN 325 MG PO TABS
325.0000 mg | ORAL_TABLET | Freq: Four times a day (QID) | ORAL | Status: DC | PRN
  Administered 2020-06-08 – 2020-06-12 (×11): 650 mg via ORAL
  Filled 2020-06-08 (×11): qty 2

## 2020-06-08 MED ORDER — EPHEDRINE SULFATE-NACL 50-0.9 MG/10ML-% IV SOSY
PREFILLED_SYRINGE | INTRAVENOUS | Status: DC | PRN
  Administered 2020-06-08: 10 mg via INTRAVENOUS

## 2020-06-08 MED ORDER — SODIUM CHLORIDE 0.9 % IV SOLN: INTRAVENOUS | Status: DC | PRN

## 2020-06-08 MED ORDER — SODIUM CHLORIDE (PF) 0.9 % IJ SOLN
INTRAMUSCULAR | Status: DC | PRN
  Administered 2020-06-08: 20 mL

## 2020-06-08 MED ORDER — MIDAZOLAM HCL 2 MG/2ML IJ SOLN
1.0000 mg | INTRAMUSCULAR | Status: DC
  Administered 2020-06-08: 1 mg via INTRAVENOUS
  Filled 2020-06-08: qty 2

## 2020-06-08 MED ORDER — BUPIVACAINE HCL 0.25 % IJ SOLN
INTRAMUSCULAR | Status: AC
  Filled 2020-06-08: qty 1

## 2020-06-08 MED ORDER — CEFAZOLIN SODIUM-DEXTROSE 2-4 GM/100ML-% IV SOLN
2.0000 g | INTRAVENOUS | Status: AC
  Administered 2020-06-08: 2 g via INTRAVENOUS
  Filled 2020-06-08: qty 100

## 2020-06-08 MED ORDER — METOCLOPRAMIDE HCL 5 MG/ML IJ SOLN: 5.0000 mg | Freq: Three times a day (TID) | INTRAMUSCULAR | Status: DC | PRN

## 2020-06-08 MED ORDER — ONDANSETRON HCL 4 MG/2ML IJ SOLN
INTRAMUSCULAR | Status: AC
  Filled 2020-06-08: qty 2

## 2020-06-08 MED ORDER — VANCOMYCIN HCL 1 G IV SOLR
INTRAVENOUS | Status: DC | PRN
  Administered 2020-06-08: 1000 mg via TOPICAL

## 2020-06-08 MED ORDER — SERTRALINE HCL 100 MG PO TABS
100.0000 mg | ORAL_TABLET | Freq: Every day | ORAL | Status: DC
Start: 2020-06-08 — End: 2020-06-12
  Administered 2020-06-08 – 2020-06-11 (×4): 100 mg via ORAL
  Filled 2020-06-08 (×4): qty 1

## 2020-06-08 MED ORDER — SODIUM CHLORIDE (PF) 0.9 % IJ SOLN
INTRAMUSCULAR | Status: AC
  Filled 2020-06-08: qty 20

## 2020-06-08 MED ORDER — METHOCARBAMOL 500 MG PO TABS
500.0000 mg | ORAL_TABLET | Freq: Four times a day (QID) | ORAL | Status: DC | PRN
  Administered 2020-06-09 – 2020-06-12 (×13): 500 mg via ORAL
  Filled 2020-06-08 (×13): qty 1

## 2020-06-08 MED ORDER — PROPOFOL 1000 MG/100ML IV EMUL
INTRAVENOUS | Status: AC
  Filled 2020-06-08: qty 100

## 2020-06-08 MED ORDER — METOCLOPRAMIDE HCL 5 MG PO TABS: 5.0000 mg | ORAL_TABLET | Freq: Three times a day (TID) | ORAL | Status: DC | PRN

## 2020-06-08 MED ORDER — PROPOFOL 500 MG/50ML IV EMUL
INTRAVENOUS | Status: DC | PRN
  Administered 2020-06-08: 50 ug/kg/min via INTRAVENOUS

## 2020-06-08 MED ORDER — ONDANSETRON HCL 4 MG/2ML IJ SOLN: 4.0000 mg | Freq: Four times a day (QID) | INTRAMUSCULAR | Status: DC | PRN

## 2020-06-08 MED ORDER — BUPIVACAINE IN DEXTROSE 0.75-8.25 % IT SOLN
INTRATHECAL | Status: DC | PRN
  Administered 2020-06-08: 1.6 mL via INTRATHECAL

## 2020-06-08 MED ORDER — ARIPIPRAZOLE 5 MG PO TABS
5.0000 mg | ORAL_TABLET | Freq: Every day | ORAL | Status: DC
  Administered 2020-06-09 – 2020-06-12 (×4): 5 mg via ORAL
  Filled 2020-06-08 (×4): qty 1

## 2020-06-08 MED ORDER — MORPHINE SULFATE (PF) 4 MG/ML IV SOLN
INTRAVENOUS | Status: AC
  Filled 2020-06-08: qty 2

## 2020-06-08 MED ORDER — PROPOFOL 10 MG/ML IV BOLUS
INTRAVENOUS | Status: DC | PRN
  Administered 2020-06-08: 20 mg via INTRAVENOUS
  Administered 2020-06-08: 30 mg via INTRAVENOUS

## 2020-06-08 MED ORDER — OXYCODONE HCL 5 MG PO TABS
5.0000 mg | ORAL_TABLET | ORAL | Status: DC | PRN
  Administered 2020-06-08: 10 mg via ORAL
  Administered 2020-06-08: 5 mg via ORAL
  Administered 2020-06-09 (×4): 10 mg via ORAL
  Administered 2020-06-09: 5 mg via ORAL
  Administered 2020-06-10 – 2020-06-12 (×14): 10 mg via ORAL
  Filled 2020-06-08 (×12): qty 2
  Filled 2020-06-08: qty 1
  Filled 2020-06-08 (×6): qty 2
  Filled 2020-06-08: qty 1
  Filled 2020-06-08 (×2): qty 2

## 2020-06-08 MED ORDER — ACETAMINOPHEN 500 MG PO TABS
1000.0000 mg | ORAL_TABLET | Freq: Once | ORAL | Status: AC
  Administered 2020-06-08: 1000 mg via ORAL
  Filled 2020-06-08: qty 2

## 2020-06-08 MED ORDER — IRRISEPT - 450ML BOTTLE WITH 0.05% CHG IN STERILE WATER, USP 99.95% OPTIME
TOPICAL | Status: DC | PRN
  Administered 2020-06-08: 450 mL

## 2020-06-08 MED ORDER — DOCUSATE SODIUM 100 MG PO CAPS
100.0000 mg | ORAL_CAPSULE | Freq: Two times a day (BID) | ORAL | Status: DC
  Administered 2020-06-08 – 2020-06-12 (×8): 100 mg via ORAL
  Filled 2020-06-08 (×8): qty 1

## 2020-06-08 MED ORDER — CHLORHEXIDINE GLUCONATE 0.12 % MT SOLN
15.0000 mL | Freq: Once | OROMUCOSAL | Status: AC
  Administered 2020-06-08: 15 mL via OROMUCOSAL

## 2020-06-08 MED ORDER — ORAL CARE MOUTH RINSE: 15.0000 mL | Freq: Once | OROMUCOSAL | Status: AC

## 2020-06-08 MED ORDER — 0.9 % SODIUM CHLORIDE (POUR BTL) OPTIME
TOPICAL | Status: DC | PRN
  Administered 2020-06-08: 1000 mL

## 2020-06-08 MED ORDER — INSULIN ASPART 100 UNIT/ML ~~LOC~~ SOLN
0.0000 [IU] | Freq: Three times a day (TID) | SUBCUTANEOUS | Status: DC
  Administered 2020-06-09 (×2): 2 [IU] via SUBCUTANEOUS
  Administered 2020-06-10 – 2020-06-11 (×4): 3 [IU] via SUBCUTANEOUS
  Administered 2020-06-12: 5 [IU] via SUBCUTANEOUS
  Administered 2020-06-12: 2 [IU] via SUBCUTANEOUS

## 2020-06-08 MED ORDER — BUPIVACAINE HCL (PF) 0.25 % IJ SOLN
INTRAMUSCULAR | Status: DC | PRN
  Administered 2020-06-08: 10 mL
  Administered 2020-06-08: 20 mL

## 2020-06-08 MED ORDER — CLONIDINE HCL (ANALGESIA) 100 MCG/ML EP SOLN
150.0000 ug | Freq: Once | EPIDURAL | Status: DC
  Filled 2020-06-08: qty 1.5

## 2020-06-08 MED ORDER — INSULIN ASPART 100 UNIT/ML ~~LOC~~ SOLN
0.0000 [IU] | Freq: Every day | SUBCUTANEOUS | Status: DC
  Administered 2020-06-08 – 2020-06-09 (×2): 2 [IU] via SUBCUTANEOUS

## 2020-06-08 MED ORDER — LACTATED RINGERS IV SOLN: INTRAVENOUS | Status: DC

## 2020-06-08 MED ORDER — HYDROGEN PEROXIDE 3 % EX SOLN
CUTANEOUS | Status: AC
  Filled 2020-06-08: qty 473

## 2020-06-08 MED ORDER — ASPIRIN 81 MG PO CHEW
81.0000 mg | CHEWABLE_TABLET | Freq: Two times a day (BID) | ORAL | Status: DC
  Administered 2020-06-08 – 2020-06-12 (×8): 81 mg via ORAL
  Filled 2020-06-08 (×8): qty 1

## 2020-06-08 MED ORDER — HYDROMORPHONE HCL 1 MG/ML IJ SOLN
0.5000 mg | INTRAMUSCULAR | Status: DC | PRN
Start: 2020-06-08 — End: 2020-06-12

## 2020-06-08 MED ORDER — INSULIN ASPART 100 UNIT/ML ~~LOC~~ SOLN
4.0000 [IU] | Freq: Three times a day (TID) | SUBCUTANEOUS | Status: DC
  Administered 2020-06-08 – 2020-06-12 (×11): 4 [IU] via SUBCUTANEOUS

## 2020-06-08 MED ORDER — EPHEDRINE 5 MG/ML INJ
INTRAVENOUS | Status: AC
  Filled 2020-06-08: qty 10

## 2020-06-08 MED ORDER — ONDANSETRON HCL 4 MG PO TABS: 4.0000 mg | ORAL_TABLET | Freq: Four times a day (QID) | ORAL | Status: DC | PRN

## 2020-06-08 MED ORDER — BUPIVACAINE LIPOSOME 1.3 % IJ SUSP
20.0000 mL | Freq: Once | INTRAMUSCULAR | Status: DC
  Filled 2020-06-08: qty 20

## 2020-06-08 MED ORDER — POVIDONE-IODINE 10 % EX SWAB: 2.0000 "application " | Freq: Once | CUTANEOUS | Status: DC

## 2020-06-08 MED ORDER — GABAPENTIN 400 MG PO CAPS
800.0000 mg | ORAL_CAPSULE | Freq: Three times a day (TID) | ORAL | Status: DC
  Administered 2020-06-08 – 2020-06-12 (×13): 800 mg via ORAL
  Filled 2020-06-08 (×12): qty 2

## 2020-06-08 MED ORDER — ALBUTEROL SULFATE (2.5 MG/3ML) 0.083% IN NEBU: 2.5000 mg | INHALATION_SOLUTION | Freq: Four times a day (QID) | RESPIRATORY_TRACT | Status: DC | PRN

## 2020-06-08 MED ORDER — MENTHOL 3 MG MT LOZG: 1.0000 | LOZENGE | OROMUCOSAL | Status: DC | PRN

## 2020-06-08 MED ORDER — GLIPIZIDE 5 MG PO TABS
2.5000 mg | ORAL_TABLET | Freq: Every day | ORAL | Status: DC
  Administered 2020-06-09 – 2020-06-12 (×4): 2.5 mg via ORAL
  Filled 2020-06-08 (×6): qty 1

## 2020-06-08 MED ORDER — DEXAMETHASONE SODIUM PHOSPHATE 10 MG/ML IJ SOLN
INTRAMUSCULAR | Status: DC | PRN
  Administered 2020-06-08: 5 mg

## 2020-06-08 MED ORDER — MORPHINE SULFATE 4 MG/ML IJ SOLN
INTRAMUSCULAR | Status: DC | PRN
  Administered 2020-06-08: 8 mg

## 2020-06-08 MED ORDER — FENTANYL CITRATE (PF) 100 MCG/2ML IJ SOLN
50.0000 ug | INTRAMUSCULAR | Status: DC
  Administered 2020-06-08: 100 ug via INTRAVENOUS
  Filled 2020-06-08: qty 2

## 2020-06-08 MED ORDER — ALBUTEROL SULFATE HFA 108 (90 BASE) MCG/ACT IN AERS: 2.0000 | INHALATION_SPRAY | RESPIRATORY_TRACT | Status: DC | PRN

## 2020-06-08 MED ORDER — ROPIVACAINE HCL 5 MG/ML IJ SOLN
INTRAMUSCULAR | Status: DC | PRN
  Administered 2020-06-08: 20 mL via PERINEURAL

## 2020-06-08 MED ORDER — FENTANYL CITRATE (PF) 100 MCG/2ML IJ SOLN: 25.0000 ug | INTRAMUSCULAR | Status: DC | PRN

## 2020-06-08 MED ORDER — METHOCARBAMOL 1000 MG/10ML IJ SOLN
500.0000 mg | Freq: Four times a day (QID) | INTRAVENOUS | Status: DC | PRN
  Filled 2020-06-08: qty 5

## 2020-06-08 MED ORDER — OXYCODONE HCL ER 10 MG PO T12A
10.0000 mg | EXTENDED_RELEASE_TABLET | Freq: Two times a day (BID) | ORAL | Status: DC
  Administered 2020-06-08 – 2020-06-09 (×2): 10 mg via ORAL
  Filled 2020-06-08 (×2): qty 1

## 2020-06-08 MED ORDER — POVIDONE-IODINE 7.5 % EX SOLN: Freq: Once | CUTANEOUS | Status: AC

## 2020-06-08 MED ORDER — ONDANSETRON HCL 4 MG/2ML IJ SOLN
INTRAMUSCULAR | Status: DC | PRN
  Administered 2020-06-08: 4 mg via INTRAVENOUS

## 2020-06-08 MED ORDER — SODIUM CHLORIDE 0.9 % IR SOLN
Status: DC | PRN
  Administered 2020-06-08: 3000 mL
  Administered 2020-06-08 (×2): 1000 mL

## 2020-06-08 MED ORDER — AMITRIPTYLINE HCL 10 MG PO TABS
10.0000 mg | ORAL_TABLET | Freq: Every day | ORAL | Status: DC
  Administered 2020-06-08 – 2020-06-11 (×4): 10 mg via ORAL
  Filled 2020-06-08 (×5): qty 1

## 2020-06-08 MED ORDER — TRANEXAMIC ACID 1000 MG/10ML IV SOLN
INTRAVENOUS | Status: DC | PRN
  Administered 2020-06-08: 2000 mg via TOPICAL

## 2020-06-08 MED ORDER — CEFAZOLIN SODIUM-DEXTROSE 1-4 GM/50ML-% IV SOLN
1.0000 g | Freq: Three times a day (TID) | INTRAVENOUS | Status: AC
  Administered 2020-06-08 – 2020-06-09 (×2): 1 g via INTRAVENOUS
  Filled 2020-06-08 (×2): qty 50

## 2020-06-08 MED ORDER — VANCOMYCIN HCL 1000 MG IV SOLR
INTRAVENOUS | Status: AC
  Filled 2020-06-08: qty 1000

## 2020-06-08 MED ORDER — PHENYLEPHRINE HCL-NACL 10-0.9 MG/250ML-% IV SOLN
INTRAVENOUS | Status: DC | PRN
  Administered 2020-06-08: 25 ug/min via INTRAVENOUS

## 2020-06-08 MED ORDER — PHENOL 1.4 % MT LIQD: 1.0000 | OROMUCOSAL | Status: DC | PRN

## 2020-06-08 MED ORDER — PROPOFOL 10 MG/ML IV BOLUS
INTRAVENOUS | Status: AC
  Filled 2020-06-08: qty 20

## 2020-06-08 MED ORDER — CLONIDINE HCL (ANALGESIA) 100 MCG/ML EP SOLN
EPIDURAL | Status: DC | PRN
  Administered 2020-06-08: 1 mL via INTRA_ARTICULAR

## 2020-06-08 MED ORDER — TRANEXAMIC ACID-NACL 1000-0.7 MG/100ML-% IV SOLN
1000.0000 mg | INTRAVENOUS | Status: AC
  Administered 2020-06-08: 1000 mg via INTRAVENOUS
  Filled 2020-06-08: qty 100

## 2020-06-08 MED ORDER — ISOPROPYL ALCOHOL 70 % SOLN
Status: AC
  Filled 2020-06-08: qty 480

## 2020-06-08 SURGICAL SUPPLY — 77 items
BAG DECANTER FOR FLEXI CONT (MISCELLANEOUS) ×2 IMPLANT
BAG SPEC THK2 15X12 ZIP CLS (MISCELLANEOUS) ×1
BAG ZIPLOCK 12X15 (MISCELLANEOUS) ×2 IMPLANT
BASEPLATE TIB TRIATH SZ1 RT (Plate) ×1 IMPLANT
BLADE HEX COATED 2.75 (ELECTRODE) ×2 IMPLANT
BLADE SAG 18X100X1.27 (BLADE) ×2 IMPLANT
BLADE SURG 15 STRL LF DISP TIS (BLADE) ×1 IMPLANT
BLADE SURG 15 STRL SS (BLADE) ×2
BNDG CMPR MED 10X6 ELC LF (GAUZE/BANDAGES/DRESSINGS) ×1
BNDG COHESIVE 6X5 TAN STRL LF (GAUZE/BANDAGES/DRESSINGS) ×2 IMPLANT
BNDG ELASTIC 6X10 VLCR STRL LF (GAUZE/BANDAGES/DRESSINGS) ×2 IMPLANT
BOWL SMART MIX CTS (DISPOSABLE) IMPLANT
BSPLAT TIB 1 KN TRITANIUM (Plate) ×1 IMPLANT
CLSR STERI-STRIP ANTIMIC 1/2X4 (GAUZE/BANDAGES/DRESSINGS) ×1 IMPLANT
COMP FEM CMTLS TRIATH SZ1 LT (Joint) ×2 IMPLANT
COMPONENT FEM CMTLS TRTH SZ1LT (Joint) IMPLANT
COOLER ICEMAN CLASSIC (MISCELLANEOUS) ×1 IMPLANT
COVER SURGICAL LIGHT HANDLE (MISCELLANEOUS) ×2 IMPLANT
CUFF TOURN SGL QUICK 34 (TOURNIQUET CUFF) ×2
CUFF TRNQT CYL 34X4.125X (TOURNIQUET CUFF) ×1 IMPLANT
DECANTER SPIKE VIAL GLASS SM (MISCELLANEOUS) ×4 IMPLANT
DRAPE ORTHO SPLIT 77X108 STRL (DRAPES) ×2
DRAPE SHEET LG 3/4 BI-LAMINATE (DRAPES) ×2 IMPLANT
DRAPE SURG ORHT 6 SPLT 77X108 (DRAPES) ×2 IMPLANT
DRAPE U-SHAPE 47X51 STRL (DRAPES) ×2 IMPLANT
DRSG AQUACEL AG ADV 3.5X10 (GAUZE/BANDAGES/DRESSINGS) ×1 IMPLANT
DRSG AQUACEL AG ADV 3.5X14 (GAUZE/BANDAGES/DRESSINGS) ×1 IMPLANT
DURAPREP 26ML APPLICATOR (WOUND CARE) ×4 IMPLANT
ELECT REM PT RETURN 15FT ADLT (MISCELLANEOUS) ×2 IMPLANT
GAUZE SPONGE 4X4 12PLY STRL (GAUZE/BANDAGES/DRESSINGS) ×2 IMPLANT
GAUZE XEROFORM 5X9 LF (GAUZE/BANDAGES/DRESSINGS) ×1 IMPLANT
GLOVE SRG 8 PF TXTR STRL LF DI (GLOVE) ×1 IMPLANT
GLOVE SURG ENC MOIS LTX SZ7 (GLOVE) ×2 IMPLANT
GLOVE SURG ORTHO 8.0 STRL STRW (GLOVE) ×2 IMPLANT
GLOVE SURG UNDER POLY LF SZ7 (GLOVE) ×2 IMPLANT
GLOVE SURG UNDER POLY LF SZ8 (GLOVE) ×2
GOWN STRL REUS W/ TWL LRG LVL3 (GOWN DISPOSABLE) ×2 IMPLANT
GOWN STRL REUS W/TWL LRG LVL3 (GOWN DISPOSABLE) ×4
HANDPIECE INTERPULSE COAX TIP (DISPOSABLE) ×2
HOOD PEEL AWAY FLYTE STAYCOOL (MISCELLANEOUS) ×8 IMPLANT
IMMOBILIZER KNEE 20 (SOFTGOODS) ×2
IMMOBILIZER KNEE 20 THIGH 36 (SOFTGOODS) IMPLANT
INSERT TIB BEAR CS SZ1 11 (Miscellaneous) ×1 IMPLANT
JET LAVAGE IRRISEPT WOUND (IRRIGATION / IRRIGATOR) ×2
KIT TURNOVER KIT A (KITS) IMPLANT
KNEE PATELLA ASYMMETRIC 9X29 (Knees) ×1 IMPLANT
LAVAGE JET IRRISEPT WOUND (IRRIGATION / IRRIGATOR) ×1 IMPLANT
MANIFOLD NEPTUNE II (INSTRUMENTS) ×2 IMPLANT
NDL SAFETY ECLIPSE 18X1.5 (NEEDLE) ×1 IMPLANT
NEEDLE HYPO 18GX1.5 SHARP (NEEDLE) ×2
NEEDLE HYPO 22GX1.5 SAFETY (NEEDLE) ×4 IMPLANT
NS IRRIG 1000ML POUR BTL (IV SOLUTION) ×2 IMPLANT
PACK TOTAL KNEE CUSTOM (KITS) ×2 IMPLANT
PAD COLD SHLDR WRAP-ON (PAD) ×1 IMPLANT
PADDING CAST COTTON 6X4 STRL (CAST SUPPLIES) ×1 IMPLANT
PENCIL SMOKE EVACUATOR (MISCELLANEOUS) IMPLANT
PIN FLUTED HEDLESS FIX 3.5X1/8 (PIN) ×1 IMPLANT
PROTECTOR NERVE ULNAR (MISCELLANEOUS) ×2 IMPLANT
SET HNDPC FAN SPRY TIP SCT (DISPOSABLE) ×1 IMPLANT
SOL PREP PROV IODINE SCRUB 4OZ (MISCELLANEOUS) ×2 IMPLANT
SPONGE LAP 18X18 RF (DISPOSABLE) ×2 IMPLANT
STRIP CLOSURE SKIN 1/2X4 (GAUZE/BANDAGES/DRESSINGS) ×4 IMPLANT
SUT ETHILON 3 0 PS 1 (SUTURE) ×4 IMPLANT
SUT MNCRL AB 3-0 PS2 18 (SUTURE) ×2 IMPLANT
SUT VIC AB 0 CT1 27 (SUTURE) ×2
SUT VIC AB 0 CT1 27XBRD ANBCTR (SUTURE) IMPLANT
SUT VIC AB 0 CT1 36 (SUTURE) ×8 IMPLANT
SUT VIC AB 1 CT1 36 (SUTURE) ×8 IMPLANT
SUT VIC AB 2-0 CT1 27 (SUTURE) ×8
SUT VIC AB 2-0 CT1 TAPERPNT 27 (SUTURE) ×4 IMPLANT
SYR 20ML LL LF (SYRINGE) ×2 IMPLANT
SYR 30ML LL (SYRINGE) ×5 IMPLANT
TOWEL OR 17X26 10 PK STRL BLUE (TOWEL DISPOSABLE) ×6 IMPLANT
TRAY CATH INTERMITTENT SS 16FR (CATHETERS) ×1 IMPLANT
TRAY FOLEY MTR SLVR 16FR STAT (SET/KITS/TRAYS/PACK) ×2 IMPLANT
TRIATHLON TIBIAL BEARING (Knees) ×1 IMPLANT
WATER STERILE IRR 1000ML POUR (IV SOLUTION) ×4 IMPLANT

## 2020-06-08 NOTE — Progress Notes (Signed)
Assisted Dr. Willette Alma with left, ultrasound guided, adductor canal block. Side rails up, monitors on throughout procedure. See vital signs in flow sheet. Tolerated Procedure well. Frequent PVCs anywhere from 20-37/min. Patient cannot feel irregular heartbeat and is asymptomatic.

## 2020-06-08 NOTE — Anesthesia Procedure Notes (Signed)
Procedure Name: MAC Date/Time: 06/08/2020 12:13 PM Performed by: Maxwell Caul, CRNA Pre-anesthesia Checklist: Patient identified, Emergency Drugs available, Suction available and Patient being monitored Oxygen Delivery Method: Simple face mask

## 2020-06-08 NOTE — Progress Notes (Signed)
Orthopedic Tech Progress Note Patient Details:  [name] 09/14/1955 [id number]  Patient ID: [name], female   DOB: [birth year], 65 y.o.   MRN: [id number]   [name] 06/08/2020, 5:09 PM cpm place on left leg @1700  in room

## 2020-06-08 NOTE — Transfer of Care (Signed)
Immediate Anesthesia Transfer of Care Note  Patient: [name]  Procedure(s) Performed: LEFT TOTAL KNEE ARTHROPLASTY (Left Knee)  Patient Location: PACU  Anesthesia Type:Spinal  Level of Consciousness: awake, alert , oriented and patient cooperative  Airway & Oxygen Therapy: Patient Spontanous Breathing and Patient connected to face mask oxygen  Post-op Assessment: Report given to RN and Post -op Vital signs reviewed and stable  Post vital signs: Reviewed and stable  Last Vitals:  Vitals Value Taken Time  BP 105/53 06/08/20 1522  Temp 36.4 C 06/08/20 1522  Pulse 68 06/08/20 1526  Resp 10 06/08/20 1526  SpO2 100 % 06/08/20 1526  Vitals shown include unvalidated device data.  Last Pain:  Vitals:   06/08/20 1200  TempSrc:   PainSc: 0-No pain         Complications: No complications documented.

## 2020-06-08 NOTE — H&P (Signed)
TOTAL KNEE ADMISSION H&P  Patient is being admitted for left total knee arthroplasty.  Subjective:  Chief Complaint:left knee pain.  HPI: [name], 65 y.o. female, has a history of pain and functional disability in the left knee due to arthritis and has failed non-surgical conservative treatments for greater than 12 weeks to includeNSAID's and/or analgesics, corticosteriod injections, use of assistive devices and activity modification.  Onset of symptoms was gradual, starting >10 years ago with gradually worsening course since that time. The patient noted no past surgery on the left knee(s).  Patient currently rates pain in the left knee(s) at 9 out of 10 with activity. Patient has night pain, worsening of pain with activity and weight bearing, pain that interferes with activities of daily living, pain with passive range of motion, crepitus and joint swelling.  Patient has evidence of subchondral sclerosis and joint space narrowing by imaging studies. This patient has had Right total knee replacement done about 10 years ago which was a cemented DePuy knee.  This had to be revised at 5 years due to cement bone interface loosening.  Plan for press-fit today if possible.  The bone quality does not allow we will use the Stryker cement plus the triathlon knee replacement.. There is no active infection.  Patient Active Problem List   Diagnosis Date Noted  . Nephrolithiasis 07/08/2017  . Abnormal EKG 06/18/2017  . Preop cardiovascular exam 06/18/2017  . History of myocardial infarction 06/18/2017  . Chronic joint pain 10/08/2016  . Asthma 10/08/2016  . History of kidney stones 10/08/2016  . Insomnia 10/08/2016  . Diabetes mellitus type 2, noninsulin dependent (HCC) 09/10/2013  . Obesity (BMI 30-39.9) 09/10/2013  . GERD 03/15/2009   Past Medical History:  Diagnosis Date  . Anxiety   . Arthritis   . Asthma   . Depression   . Dysrhythmia   . History of GI bleed    2010--- UPPER ESOPHAGITIS/  DUODERAL ULCER/ EROSION  . History of hiatal hernia   . History of kidney stones   . History of shingles    2013  . Myocardial infarction (HCC) 10 years ago   . Nephrolithiasis    BILATERAL  . Right ureteral stone   . Type 2 diabetes, diet controlled (HCC)   . Urgency of urination     Past Surgical History:  Procedure Laterality Date  . CARDIAC CATHETERIZATION  02-10-2009  DR BENSIMHON   NORMAL CORONARIES/  LOW NORMAL LVF WITHOUT WALL MOTION ABNORMALITIES  . CARDIAC CATHETERIZATION  11-21-2010  DR Jacinto Halim   NORMAL CORONARIES/  EF 50-55%  . CHOLECYSTECTOMY  1990s   back in [place]   . CYSTOSCOPY W/ URETERAL STENT PLACEMENT Right 09/04/2013   Procedure: CYSTOSCOPY WITH RETROGRADE PYELOGRAM/URETERAL STENT PLACEMENT;  Surgeon: Milford Cage, MD;  Location: [facility];  Service: Urology;  Laterality: Right;  . CYSTOSCOPY WITH RETROGRADE PYELOGRAM, URETEROSCOPY AND STENT PLACEMENT Right 09/27/2013   Procedure: CYSTOSCOPY WITH RETROGRADE PYELOGRAM, URETEROSCOPY AND STENT EXCHANGE;  Surgeon: Milford Cage, MD;  Location: [facility];  Service: Urology;  Laterality: Right;  . HOLMIUM LASER APPLICATION Right 09/27/2013   Procedure: HOLMIUM LASER APPLICATION;  Surgeon: Milford Cage, MD;  Location: [facility];  Service: Urology;  Laterality: Right;  . HYSTEROSCOPY WITH D & C  01-25-2003  . IR URETERAL STENT RIGHT NEW ACCESS W/O SEP NEPHROSTOMY CATH  07/08/2017  . LAPAROSCOPIC ASSISTED VAGINAL HYSTERECTOMY  03-02-2003   W/  BILATERAL SALPINGOOPHORECTOMY  . LAPAROSCOPY  EXTENSIVE LYSIS ADHESIONS/  REDO PARAESOPHAGEAL HIATAL HERNIA WITH PRIMARY CLOSURE AND MESH/ NISSEN FUNDOPLICATION (1cm)/  REPAIR GASTROTOMY  08-22-2009  . NEPHROLITHOTOMY Right 07/08/2017   Procedure: NEPHROLITHOTOMY PERCUTANEOUS;  Surgeon: Jerilee Field, MD;  Location: [facility];  Service: Urology;  Laterality: Right;  . NISSEN FUNDOPLICATION  2000   W/  CHOLECYSTECTOMY  . SHOULDER  ARTHROSCOPY WITH SUBACROMIAL DECOMPRESSION, ROTATOR CUFF REPAIR AND BICEP TENDON REPAIR Left 08-02-2010   AND LABRAL DEBRIDEMENT  . TOTAL KNEE ARTHROPLASTY Right 07-01-2006  . TOTAL KNEE REVISION  02/25/2012   Procedure: TOTAL KNEE REVISION;  Surgeon: Cammy Copa, MD;  Location: [facility];  Service: Orthopedics;  Laterality: Right;  Revise right total knee arthroplasty  . TUBAL LIGATION  1990  . WRIST FUSION Left 2004   RETAINED HARDWARE    Current Facility-Administered Medications  Medication Dose Route Frequency Provider Last Rate Last Admin  . bupivacaine liposome (EXPAREL) 1.3 % injection 266 mg  20 mL Infiltration Once Cammy Copa, MD      . ceFAZolin (ANCEF) IVPB 2g/100 mL premix  2 g Intravenous On Call to OR Magnant, Charles L, PA-C      . fentaNYL (SUBLIMAZE) injection 50-100 mcg  50-100 mcg Intravenous UD Woodrum, Chelsey L, MD   100 mcg at 06/08/20 1151  . lactated ringers infusion   Intravenous Continuous Mellody Dance, MD 10 mL/hr at 06/08/20 1057 New Bag at 06/08/20 1057  . midazolam (VERSED) injection 1-2 mg  1-2 mg Intravenous UD Woodrum, Chelsey L, MD   1 mg at 06/08/20 1153  . povidone-iodine 10 % swab 2 application  2 application Topical Once Magnant, Charles L, PA-C      . tranexamic acid (CYKLOKAPRON) 2,000 mg in sodium chloride 0.9 % 50 mL Topical Application  2,000 mg Topical To OR Cammy Copa, MD      . tranexamic acid (CYKLOKAPRON) IVPB 1,000 mg  1,000 mg Intravenous To OR Magnant, Charles L, PA-C       No Known Allergies  Social History   Tobacco Use  . Smoking status: Never Smoker  . Smokeless tobacco: Never Used  Substance Use Topics  . Alcohol use: No    Family History  Problem Relation Age of Onset  . Heart disease Father      Review of Systems  Musculoskeletal: Positive for arthralgias.  All other systems reviewed and are negative.   Objective:  Physical Exam Vitals reviewed.  HENT:     Head: Normocephalic.     Nose: Nose  normal.     Mouth/Throat:     Mouth: Mucous membranes are moist.  Eyes:     Pupils: Pupils are equal, round, and reactive to light.  Cardiovascular:     Rate and Rhythm: Normal rate.     Pulses: Normal pulses.  Pulmonary:     Effort: Pulmonary effort is normal.  Abdominal:     General: Abdomen is flat.  Musculoskeletal:     Cervical back: Normal range of motion.  Skin:    General: Skin is warm.     Capillary Refill: Capillary refill takes less than 2 seconds.  Neurological:     General: No focal deficit present.     Mental Status: She is alert.  Psychiatric:        Mood and Affect: Mood normal.   Left knee exam demonstrates mild effusion with intact extensor mechanism.  Collateral and cruciate ligaments are stable.  Pedal pulses palpable.  Ankle dorsiflexion intact.  No groin pain with internal ex rotation of the leg.  Range of motion is about 5 to 110 degrees.  Vital signs in last 24 hours: Temp:  [98.6 F (37 C)] 98.6 F (37 C) (01/13 1016) Pulse Rate:  [35-81] 35 (01/13 1206) Resp:  [8-24] 24 (01/13 1206) BP: (108-141)/(53-95) 120/95 (01/13 1206) SpO2:  [83 %-96 %] 91 % (01/13 1206) Weight:  [75.8 kg] 75.8 kg (01/13 1016)  Labs:   Estimated body mass index is 29.6 kg/m as calculated from the following:   Height as of this encounter: 5\' 3"  (1.6 m).   Weight as of this encounter: 75.8 kg.   Imaging Review Plain radiographs demonstrate moderate degenerative joint disease of the left knee(s). The overall alignment isneutral. The bone quality appears to be good for age and reported activity level.      Assessment/Plan:  End stage arthritis, left knee   The patient history, physical examination, clinical judgment of the provider and imaging studies are consistent with end stage degenerative joint disease of the left knee(s) and total knee arthroplasty is deemed medically necessary. The treatment options including medical management, injection therapy arthroscopy  and arthroplasty were discussed at length. The risks and benefits of total knee arthroplasty were presented and reviewed. The risks due to aseptic loosening, infection, stiffness, patella tracking problems, thromboembolic complications and other imponderables were discussed. The patient acknowledged the explanation, agreed to proceed with the plan and consent was signed. Patient is being admitted for inpatient treatment for surgery, pain control, PT, OT, prophylactic antibiotics, VTE prophylaxis, progressive ambulation and ADL's and discharge planning. The patient is planning to be discharged home with home health services     Patient's anticipated LOS is less than 2 midnights, meeting these requirements: - Younger than 64 - Lives within 1 hour of care - Has a competent adult at home to recover with post-op recover - NO history of  - Chronic pain requiring opiods  - Diabetes  - Coronary Artery Disease  - Heart failure  - Heart attack  - Stroke  - DVT/VTE  - Cardiac arrhythmia  - Respiratory Failure/COPD  - Renal failure  - Anemia  - Advanced Liver disease

## 2020-06-08 NOTE — Anesthesia Procedure Notes (Signed)
Spinal  Patient location during procedure: OR End time: 06/08/2020 12:20 PM Staffing Performed: resident/CRNA  Anesthesiologist: Freddrick March, MD Resident/CRNA: Maxwell Caul, CRNA Preanesthetic Checklist Completed: patient identified, IV checked, site marked, risks and benefits discussed, surgical consent, monitors and equipment checked, pre-op evaluation and timeout performed Spinal Block Patient position: sitting Prep: DuraPrep Patient monitoring: heart rate, cardiac monitor, continuous pulse ox and blood pressure Approach: midline Location: L3-4 Injection technique: single-shot Needle Needle type: Pencan  Needle gauge: 24 G Needle length: 10 cm Assessment Sensory level: T4 Additional Notes IV functioning, monitors applied to pt. Expiration date of kit checked and confirmed to be in date. Sterile prep and drape, hand hygiene and sterile gloved used. Pt was positioned and spine was prepped in sterile fashion. Skin was anesthetized with lidocaine. Free flow of clear CSF obtained prior to injecting local anesthetic into CSF x 1 attempt. Spinal needle aspirated freely following injection. Needle was carefully withdrawn, and pt tolerated procedure well. Loss of motor and sensory on exam post injection. Dr Lanetta Inch at bedside for entire placement.

## 2020-06-08 NOTE — Plan of Care (Signed)

## 2020-06-08 NOTE — Anesthesia Procedure Notes (Signed)
Anesthesia Regional Block: Adductor canal block   Pre-Anesthetic Checklist: ,, timeout performed, Correct Patient, Correct Site, Correct Laterality, Correct Procedure, Correct Position, site marked, Risks and benefits discussed,  Surgical consent,  Pre-op evaluation,  At surgeon's request and post-op pain management  Laterality: Left  Prep: Maximum Sterile Barrier Precautions used, chloraprep       Needles:  Injection technique: Single-shot  Needle Type: Echogenic Stimulator Needle     Needle Length: 9cm  Needle Gauge: 22     Additional Needles:   Procedures:,,,, ultrasound used (permanent image in chart),,,,  Narrative:  Start time: 06/08/2020 12:43 PM End time: 06/08/2020 12:53 PM Injection made incrementally with aspirations every 5 mL.  Performed by: Personally  Anesthesiologist: Elmer Picker, MD  Additional Notes: Monitors applied. No increased pain on injection. No increased resistance to injection. Injection made in 5cc increments. Good needle visualization. Patient tolerated procedure well.

## 2020-06-08 NOTE — Anesthesia Postprocedure Evaluation (Signed)
Anesthesia Post Note  Patient: [name]  Procedure(s) Performed: LEFT TOTAL KNEE ARTHROPLASTY (Left Knee)     Patient location during evaluation: PACU Anesthesia Type: Regional Level of consciousness: awake and alert Pain management: pain level controlled Vital Signs Assessment: post-procedure vital signs reviewed and stable Respiratory status: spontaneous breathing, nonlabored ventilation and respiratory function stable Cardiovascular status: blood pressure returned to baseline and stable Postop Assessment: no apparent nausea or vomiting Anesthetic complications: no   No complications documented.  Last Vitals:  Vitals:   06/08/20 1658 06/08/20 1700  BP: 99/64   Pulse: (!) 37 66  Resp:    Temp: 36.7 C   SpO2: 100%     Last Pain:  Vitals:   06/08/20 1658  TempSrc: Oral  PainSc:                  Lowella Curb

## 2020-06-08 NOTE — Progress Notes (Signed)
Orthopedic Tech Progress Note Patient Details:  [name] 03-May-1956 [id number]  Patient ID: [name], female   DOB: [birth year], 65 y.o.   MRN: [id number]   [name] 06/08/2020, 3:59 PM Bone foam placed on left leg

## 2020-06-08 NOTE — Brief Op Note (Signed)
° °  06/08/2020  3:16 PM  PATIENT:  [name]  65 y.o. female  PRE-OPERATIVE DIAGNOSIS:  left knee osteoarthritis  POST-OPERATIVE DIAGNOSIS:  left knee osteoarthritis  PROCEDURE:  Procedure(s): LEFT TOTAL KNEE ARTHROPLASTY  SURGEON:  Surgeon(s): Cammy Copa, MD  ASSISTANT: magnant pa  ANESTHESIA:   spinal  EBL: 100 ml    Total I/O In: 1000 [I.V.:1000] Out: 500 [Urine:450; Blood:50]  BLOOD ADMINISTERED: none  DRAINS: none   LOCAL MEDICATIONS USED: Marcaine morphine clonidine Exparel vancomycin powder  SPECIMEN:  No Specimen  COUNTS:  YES  TOURNIQUET:   Total Tourniquet Time Documented: area (laterality) - 67 minutes Total: area (laterality) - 67 minutes   DICTATION: .Other Dictation: Dictation [id number]  PLAN OF CARE: Admit for overnight observation  PATIENT DISPOSITION:  PACU - hemodynamically stable

## 2020-06-09 DIAGNOSIS — M1712 Unilateral primary osteoarthritis, left knee: Secondary | ICD-10-CM | POA: Diagnosis not present

## 2020-06-09 LAB — CBC
HCT: 29.3 % — ABNORMAL LOW (ref 36.0–46.0)
Hemoglobin: 8.8 g/dL — ABNORMAL LOW (ref 12.0–15.0)
MCH: 27.4 pg (ref 26.0–34.0)
MCHC: 30 g/dL (ref 30.0–36.0)
MCV: 91.3 fL (ref 80.0–100.0)
Platelets: 180 10*3/uL (ref 150–400)
RBC: 3.21 MIL/uL — ABNORMAL LOW (ref 3.87–5.11)
RDW: 14.5 % (ref 11.5–15.5)
WBC: 9.7 10*3/uL (ref 4.0–10.5)
nRBC: 0 % (ref 0.0–0.2)

## 2020-06-09 LAB — GLUCOSE, CAPILLARY
Glucose-Capillary: 142 mg/dL — ABNORMAL HIGH (ref 70–99)
Glucose-Capillary: 133 mg/dL — ABNORMAL HIGH (ref 70–99)
Glucose-Capillary: 79 mg/dL (ref 70–99)
Glucose-Capillary: 87 mg/dL (ref 70–99)
Glucose-Capillary: 227 mg/dL — ABNORMAL HIGH (ref 70–99)

## 2020-06-09 LAB — URINE CULTURE: Culture: <10000 — AB

## 2020-06-09 MED ORDER — ACETAMINOPHEN 325 MG PO TABS: 325.0000 mg | ORAL_TABLET | Freq: Four times a day (QID) | ORAL | 0 refills | Status: DC | PRN

## 2020-06-09 MED ORDER — OXYCODONE HCL ER 10 MG PO T12A: 10.0000 mg | EXTENDED_RELEASE_TABLET | Freq: Two times a day (BID) | ORAL | 0 refills | Status: DC

## 2020-06-09 NOTE — Evaluation (Signed)
Physical Therapy Evaluation Patient Details Name: [name] MRN: [id number] DOB: [birth year] Today's Date: 06/09/2020   History of Present Illness  Pt s/p L TKR and with hx of DM, MI, and R TKR in 2018 with revisioni in 2013  Clinical Impression  Pt s/p L TKR and presents with decreased L LE strength/ROM, post op pain, and premorbid deconditioning limiting functional mobility.  Pt should progress to dc home with family assist and reports HHPT will follow up.    Follow Up Recommendations Follow surgeon's recommendation for DC plan and follow-up therapies;Home health PT    Equipment Recommendations  Rolling walker with 5" wheels (youth)    Recommendations for Other Services       Precautions / Restrictions Precautions Precautions: Knee;Fall Required Braces or Orthoses: Knee Immobilizer - Left Knee Immobilizer - Left: Discontinue once straight leg raise with < 10 degree lag Restrictions Weight Bearing Restrictions: No Other Position/Activity Restrictions: WBAT      Mobility  Bed Mobility Overal bed mobility: Needs Assistance Bed Mobility: Supine to Sit     Supine to sit: Mod assist     General bed mobility comments: INcreased time with cues for sequence and physical assist to manage L LE and to bring trunk to upright    Transfers Overall transfer level: Needs assistance Equipment used: Rolling walker (2 wheeled) Transfers: Sit to/from Stand Sit to Stand: Mod assist;From elevated surface         General transfer comment: cues for LE management and use of UEs to self assist  Ambulation/Gait Ambulation/Gait assistance: Min assist;Mod assist Gait Distance (Feet): 38 Feet (and 18' into bathroom) Assistive device: Rolling walker (2 wheeled) Gait Pattern/deviations: Step-to pattern;Decreased step length - right;Decreased step length - left;Shuffle;Trunk flexed     General Gait Details: cues for sequence, posture and position from RW.  Physical assist for  balance/support and RW management  Stairs            Wheelchair Mobility    Modified Rankin (Stroke Patients Only)       Balance Overall balance assessment: Needs assistance Sitting-balance support: No upper extremity supported;Feet supported Sitting balance-Leahy Scale: Fair     Standing balance support: Bilateral upper extremity supported Standing balance-Leahy Scale: Poor                               Pertinent Vitals/Pain Pain Assessment: 0-10 Pain Score: 6  Pain Location: L knee Pain Descriptors / Indicators: Aching;Sore Pain Intervention(s): Limited activity within patient's tolerance;Monitored during session;Premedicated before session;Ice applied    Home Living Family/patient expects to be discharged to:: Private residence Living Arrangements: Children;Other (Comment);Other relatives;Non-relatives/Friends Available Help at Discharge: Family;Available 24 hours/day Type of Home: Mobile home Home Access: Stairs to enter Entrance Stairs-Rails: Right Entrance Stairs-Number of Steps: 4 Home Layout: One level Home Equipment: Cane - single point;Bedside commode Additional Comments: Pt will be assisted by children and grandchildren    Prior Function Level of Independence: Independent;Independent with assistive device(s)         Comments: using RW 2* knee pain     Hand Dominance        Extremity/Trunk Assessment   Upper Extremity Assessment Upper Extremity Assessment: Generalized weakness    Lower Extremity Assessment Lower Extremity Assessment: LLE deficits/detail;Generalized weakness LLE Deficits / Details: 2+/5 quads with AAROM at knee -5 - 75    Cervical / Trunk Assessment Cervical / Trunk Assessment: Normal  Communication  Communication: No difficulties  Cognition Arousal/Alertness: Awake/alert Behavior During Therapy: WFL for tasks assessed/performed Overall Cognitive Status: Within Functional Limits for tasks assessed                                         General Comments      Exercises Total Joint Exercises Ankle Circles/Pumps: AROM;15 reps;Supine;Both Quad Sets: AROM;Both;10 reps;Supine Heel Slides: AAROM;Left;Supine;15 reps Straight Leg Raises: AAROM;Left;10 reps;Supine   Assessment/Plan    PT Assessment Patient needs continued PT services  PT Problem List Decreased strength;Decreased range of motion;Decreased activity tolerance;Decreased balance;Decreased mobility;Decreased knowledge of use of DME;Pain;Decreased safety awareness       PT Treatment Interventions DME instruction;Gait training;Stair training;Functional mobility training;Therapeutic activities;Therapeutic exercise;Patient/family education;Balance training    PT Goals (Current goals can be found in the Care Plan section)  Acute Rehab PT Goals Patient Stated Goal: Regain IND PT Goal Formulation: With patient Time For Goal Achievement: 06/16/20 Potential to Achieve Goals: Good    Frequency 7X/week   Barriers to discharge        Co-evaluation               AM-PAC PT "6 Clicks" Mobility  Outcome Measure Help needed turning from your back to your side while in a flat bed without using bedrails?: A Lot Help needed moving from lying on your back to sitting on the side of a flat bed without using bedrails?: A Lot Help needed moving to and from a bed to a chair (including a wheelchair)?: A Lot Help needed standing up from a chair using your arms (e.g., wheelchair or bedside chair)?: A Lot Help needed to walk in hospital room?: A Lot Help needed climbing 3-5 steps with a railing? : A Lot 6 Click Score: 12    End of Session Equipment Utilized During Treatment: Gait belt;Left knee immobilizer Activity Tolerance: Patient tolerated treatment well Patient left: in chair;with call bell/phone within reach;with chair alarm set Nurse Communication: Mobility status PT Visit Diagnosis: Unsteadiness on feet  (R26.81);Muscle weakness (generalized) (M62.81);Difficulty in walking, not elsewhere classified (R26.2);Pain Pain - Right/Left: Left Pain - part of body: Knee    Time: 0912-0955 PT Time Calculation (min) (ACUTE ONLY): 43 min   Charges:   PT Evaluation $PT Eval Low Complexity: 1 Low PT Treatments $Gait Training: 8-22 mins $Therapeutic Exercise: 8-22 mins        Mauro Kaufmann PT Acute Rehabilitation Services Pager [phone] Office [phone]   Dula Havlik 06/09/2020, 3:45 PM

## 2020-06-09 NOTE — Op Note (Signed)
NAME: [name], [name] MEDICAL RECORD [id number] ACCOUNT [id number] DATE OF BIRTH:Feb 19, 1956 FACILITY: [facility] LOCATION: [facility] PHYSICIAN:Kasmira Cacioppo Diamantina Providence, MD  OPERATIVE REPORT  DATE OF PROCEDURE:  06/09/2020  PREOPERATIVE DIAGNOSIS:  Left knee arthritis.  POSTOPERATIVE DIAGNOSIS:  Left knee arthritis.  PROCEDURE:  Left total knee replacement using Stryker Triathlon press-fit size 1 femur, size 1 tibia, 11 mm deep dish polyethylene insert, 29 mm offset press-fit patella.  All posterior cruciate retaining.  SURGEON:  Cammy Copa, MD  ASSISTANT:  Karenann Cai, PA.  INDICATIONS:  The patient is a 65 year old patient with end-stage left knee arthritis, who presents for operative management after explanation of risks and benefits.  PROCEDURE IN DETAIL:  The patient was brought to the operating room where spinal anesthetic was induced.  Preoperative antibiotics administered.  Timeout was called.  Left leg prescrubbed with alcohol and Betadine, allowed to air dry, prepped with  DuraPrep solution and draped in a sterile manner.  Ioban used to cover the operative field.  Timeout was called and leg was then elevated and exsanguinated with the Esmarch wrap.  Tourniquet was inflated.  Total tourniquet time 67 minutes at 300 mmHg.   Anterior approach to knee was made.  Skin and subcutaneous tissue were sharply divided.  Median parapatellar approach was made and marked with #1 Vicryl suture.  Fat pad was partially excised.  Lateral patellofemoral ligament was released.  Soft tissue  was removed from the anterior distal femur.  The patella was everted.  Precise location of the arthrotomy was marked with #1 Vicryl suture.  Patella everted and the knee was flexed.  The patient had significant wear in the medial compartment and  patellofemoral compartment.  Next the ACL was released.  Anterior horn of the lateral meniscus was released.  The intramedullary alignment was then used to make a cut on the  tibial plateau.  Based on 9 mm off the least affected lateral tibial plateau.   This was perpendicular to the mechanical axis.  Next, cut was made off the distal femur 8 mm in 5 degrees of valgus.  With those cuts performed bone quality was excellent.  A 9 mm and 11 mm spacer was placed and the patient achieved full extension.  Many  more cuts were required.  The femur was sized to a size 1.  Femoral cut was then performed anterior, posterior chamfer cuts.  Next the tibial plateau was sized to a size 1, the tibia was then placed and with the tibial tray in position and with pegs in  place the femur was placed and the patella was cut down from 22-13 mm.  Next the trial patellar button was placed.  With trial components in position and an 11 spacer the patient had excellent stability to varus and valgus stress, excellent patellar  tracking using no thumbs technique and good stability to varus and valgus stress at 0, 30 and 90 degrees.  Full extension, full flexion was present.  No liftoff present.  Trial components were removed.  Final preparation of the tibia was made.  Thorough  irrigation was then performed with 3 liters of irrigating solution.  Next a tranexamic acid sponge was allowed to sit within the incision for 3 minutes along with IrriSept soaked sponge.  IrriSept was used after the incision, after the arthrotomy and at  multiple times during the case.  Following this, the tibia was tapped into position.  Femur was tapped into position, an 11 mm spacer placed.  Patella placed.  Excellent press fit obtained.  Excellent patellar tracking same stability parameters  maintained along with range of motion.  Thorough irrigation again performed.  The tourniquet was released.  Bleeding points encountered controlled using electrocautery.  Pouring irrigation utilized at this time.  Next the arthrotomy was closed over a  bolster using #1 Vicryl suture.  Solution of IrriSept, followed by Vancomycin powder was  then placed into the arthrotomy followed by a solution of Marcaine, morphine and clonidine.  Then, the incision was closed using 0 Vicryl suture, 2-0 Vicryl suture  and 3-0 Monocryl.  Steri-Strips, Aquacel dressing applied along with Ace wrap, Iceman and knee immobilizer.  The patient tolerated the procedure well without immediate complication.  Luke's assistance was required at all times during the case for  retraction, opening and closing, mobilization of tissue.  His assistance was a medical necessity.  HN/NUANCE  D:06/08/2020 T:06/08/2020 JOB:[id number]

## 2020-06-09 NOTE — Progress Notes (Signed)
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 increased difficulty processing cues and problem solving.  RN aware.   Follow Up Recommendations  Follow surgeon's recommendation for DC plan and follow-up therapies;Home health PT     Equipment Recommendations  Rolling walker with 5" wheels    Recommendations for Other Services       Precautions / Restrictions Precautions Precautions: Knee;Fall Required Braces or Orthoses: Knee Immobilizer - Left Knee Immobilizer - Left: Discontinue once straight leg raise with < 10 degree lag Restrictions Weight Bearing Restrictions: No Other Position/Activity Restrictions: WBAT    Mobility  Bed Mobility Overal bed mobility: Needs Assistance Bed Mobility: Sit to Supine     Supine to sit: Mod assist Sit to supine: Min assist;Mod assist;+2 for physical assistance;+2 for safety/equipment   General bed mobility comments: INcreased time with cues for sequence and physical assist to manage L LE and to control trunk  Transfers Overall transfer level: Needs assistance Equipment used: Rolling walker (2 wheeled) Transfers: Sit to/from [employer] Sit to Stand: Mod assist;+2 physical assistance;+2 safety/equipment;From elevated surface Stand pivot transfers: Mod assist;+2 physical assistance;+2 safety/equipment       General transfer comment: incresed cues for LE management and use of UEs to self assist; physical assist to bring wt up and fwd and to balance in standing.  Ambulation/Gait Ambulation/Gait assistance: Min assist;Mod assist;+2 safety/equipment Gait Distance (Feet): 26 Feet Assistive device: Rolling walker (2  wheeled) Gait Pattern/deviations: Step-to pattern;Decreased step length - right;Decreased step length - left;Shuffle;Trunk flexed Gait velocity: decr   General Gait Details: cues for sequence, posture and position from RW.  Physical assist for balance/support and RW management; Pt noted with increased R lean this pm   Stairs             Wheelchair Mobility    Modified Rankin (Stroke Patients Only)       Balance Overall balance assessment: Needs assistance Sitting-balance support: No upper extremity supported;Feet supported Sitting balance-Leahy Scale: Fair     Standing balance support: Bilateral upper extremity supported Standing balance-Leahy Scale: Poor                              Cognition Arousal/Alertness: Awake/alert Behavior During Therapy: WFL for tasks assessed/performed Overall Cognitive Status: Impaired/Different from baseline Area of Impairment: Following commands;Problem solving                       Following Commands: Follows one step commands inconsistently     Problem Solving: Slow processing General Comments: Change in processing and problem solving from morning session      Exercises Total Joint Exercises Ankle Circles/Pumps: AROM;15 reps;Supine;Both Quad Sets: AROM;Both;10 reps;Supine Heel Slides: AAROM;Left;Supine;15 reps Straight Leg Raises: AAROM;Left;10 reps;Supine    General Comments        Pertinent Vitals/Pain Pain Assessment: 0-10 Pain Score: 6  Pain Location: L knee Pain Descriptors / Indicators: Aching;Sore Pain Intervention(s): Limited activity within patient's tolerance;Monitored during session;Premedicated before session;Ice applied    Home Living Family/patient expects to be discharged to:: Private residence Living Arrangements: Children;Other (Comment);Other relatives;Non-relatives/Friends Available  Help at Discharge: Family;Available 24 hours/day Type of Home: Mobile home Home Access: Stairs  to enter Entrance Stairs-Rails: Right Home Layout: One level Home Equipment: Cane - single point;Bedside commode Additional Comments: Pt will be assisted by children and grandchildren    Prior Function Level of Independence: Independent;Independent with assistive device(s)      Comments: using RW 2* knee pain   PT Goals (current goals can now be found in the care plan section) Acute Rehab PT Goals Patient Stated Goal: Regain IND PT Goal Formulation: With patient Time For Goal Achievement: 06/16/20 Potential to Achieve Goals: Good Progress towards PT goals: Not progressing toward goals - comment (increased assist required; increased difficulty processing cues and problem solving)    Frequency    7X/week      PT Plan Current plan remains appropriate    Co-evaluation              AM-PAC PT "6 Clicks" Mobility   Outcome Measure  Help needed turning from your back to your side while in a flat bed without using bedrails?: A Lot Help needed moving from lying on your back to sitting on the side of a flat bed without using bedrails?: A Lot Help needed moving to and from a bed to a chair (including a wheelchair)?: A Lot Help needed standing up from a chair using your arms (e.g., wheelchair or bedside chair)?: A Lot Help needed to walk in hospital room?: A Lot Help needed climbing 3-5 steps with a railing? : A Lot 6 Click Score: 12    End of Session Equipment Utilized During Treatment: Gait belt;Left knee immobilizer Activity Tolerance: Patient tolerated treatment well;Patient limited by fatigue Patient left: in bed;with call bell/phone within reach;with bed alarm set Nurse Communication: Mobility status PT Visit Diagnosis: Unsteadiness on feet (R26.81);Muscle weakness (generalized) (M62.81);Difficulty in walking, not elsewhere classified (R26.2);Pain Pain - Right/Left: Left Pain - part of body: Knee     Time: 1450-1520 PT Time Calculation (min) (ACUTE ONLY): 30  min  Charges:  $Gait Training: 8-22 mins $Therapeutic Exercise: 8-22 mins $Therapeutic Activity: 8-22 mins                     Mauro Kaufmann PT Acute Rehabilitation Services Pager [phone] Office [phone]    BRADSHAW,HUNTER 06/09/2020, 3:57 PM

## 2020-06-09 NOTE — Progress Notes (Signed)
Orthopedic Tech Progress Note Patient Details:  [name] [birth year] [id number]  Patient ID: [name], female   DOB: Feb 14, 1956, 65 y.o.   MRN: [id number]   [name] 06/09/2020, 4:55 PM Pt removed from cpm @1700  due to pain

## 2020-06-09 NOTE — Progress Notes (Signed)
  Subjective: Pt stable pain ok   Objective: Vital signs in last 24 hours: Temp:  [97.5 F (36.4 C)-98.7 F (37.1 C)] 98.7 F (37.1 C) (01/14 0151) Pulse Rate:  [34-86] 69 (01/14 0526) Resp:  [8-24] 17 (01/14 0526) BP: (96-141)/(43-95) 101/54 (01/14 0526) SpO2:  [83 %-100 %] 98 % (01/14 0526) Weight:  [75.8 kg] 75.8 kg (01/13 1016)  Intake/Output from previous day: 01/13 0701 - 01/14 0700 In: 3430.8 [P.O.:240; I.V.:2890.8; IV Piggyback:300] Out: 1100 [Urine:1050; Blood:50] Intake/Output this shift: No intake/output data recorded.  Exam:  Sensation intact distally Intact pulses distally Compartment soft  Labs: Recent Labs    06/09/20 0329  HGB 8.8*   Recent Labs    06/09/20 0329  WBC 9.7  RBC 3.21*  HCT 29.3*  PLT 180   No results for input(s): NA, K, CL, CO2, BUN, CREATININE, GLUCOSE, CALCIUM in the last 72 hours. No results for input(s): LABPT, INR in the last 72 hours.  Assessment/Plan: Plan PT this am and pm with possible dc to home later today if does well with PT   G Dorene Grebe 06/09/2020, 8:13 AM

## 2020-06-09 NOTE — Progress Notes (Addendum)
CRITICAL VALUE ALERT  Critical Value:  CBG 79  Date & Time Notied:  06/09/2020 1656  Intervention: 2oZ PO orange juice  Provider Notified: N/A  Orders Received/Actions taken: N/A; standing orders used   CBG 87 at 1710 after 2oZ of PO orange juice   S[place]emore, RN

## 2020-06-09 NOTE — Plan of Care (Signed)
Plan of care reviewed and discussed with the patient. 

## 2020-06-09 NOTE — Progress Notes (Signed)
Orthopedic Tech Progress Note Patient Details:  [name] Dec 21, 1955 [id number]  Patient ID: [name], female   DOB: 1956-02-04, 64 y.o.   MRN: [id number]   [name] 06/09/2020, 3:32 PM Pt placed in cpm @1530 

## 2020-06-10 DIAGNOSIS — M1712 Unilateral primary osteoarthritis, left knee: Secondary | ICD-10-CM | POA: Diagnosis not present

## 2020-06-10 LAB — GLUCOSE, CAPILLARY
Glucose-Capillary: 193 mg/dL — ABNORMAL HIGH (ref 70–99)
Glucose-Capillary: 100 mg/dL — ABNORMAL HIGH (ref 70–99)
Glucose-Capillary: 149 mg/dL — ABNORMAL HIGH (ref 70–99)

## 2020-06-10 LAB — CBC
HCT: 28.1 % — ABNORMAL LOW (ref 36.0–46.0)
Hemoglobin: 8.5 g/dL — ABNORMAL LOW (ref 12.0–15.0)
MCH: 27.5 pg (ref 26.0–34.0)
MCHC: 30.2 g/dL (ref 30.0–36.0)
MCV: 90.9 fL (ref 80.0–100.0)
Platelets: 174 10*3/uL (ref 150–400)
RBC: 3.09 MIL/uL — ABNORMAL LOW (ref 3.87–5.11)
RDW: 14.7 % (ref 11.5–15.5)
WBC: 8.2 10*3/uL (ref 4.0–10.5)
nRBC: 0 % (ref 0.0–0.2)

## 2020-06-10 NOTE — Progress Notes (Signed)
Trenda Moots notified of pt not being able to d/c today. Pt remains stable overall.

## 2020-06-10 NOTE — Progress Notes (Signed)
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     Follow Up Recommendations  Follow surgeon's recommendation for DC plan and follow-up therapies;Home health PT     Equipment Recommendations  Rolling walker with 5" wheels    Recommendations for Other Services       Precautions / Restrictions Precautions Precautions: Knee;Fall Required Braces or Orthoses: Knee Immobilizer - Left Knee Immobilizer - Left: Discontinue once straight leg raise with < 10 degree lag Restrictions Weight Bearing Restrictions: No Other Position/Activity Restrictions: WBAT    Mobility  Bed Mobility Overal bed mobility: Needs Assistance Bed Mobility: Supine to Sit     Supine to sit: Mod assist     General bed mobility comments: INcreased time with cues for sequence and physical assist to manage L LE and to control trunk  Transfers Overall transfer level: Needs assistance Equipment used: Rolling walker (2 wheeled) Transfers: Sit to/from [employer] Sit to Stand: Mod assist Stand pivot transfers: Min assist;Mod assist       General transfer comment: incresed cues for LE management and use of UEs to self assist; physical assist to bring wt up and fwd and to balance in standing.  Ambulation/Gait         Gait velocity: decr   General Gait Details: stand pvt to chair only on arrival of bfast   Stairs             Wheelchair Mobility    Modified Rankin (Stroke Patients Only)       Balance Overall balance assessment: Needs assistance Sitting-balance support: No upper extremity supported;Feet  supported Sitting balance-Leahy Scale: Fair     Standing balance support: Bilateral upper extremity supported Standing balance-Leahy Scale: Poor                              Cognition Arousal/Alertness: Awake/alert Behavior During Therapy: WFL for tasks assessed/performed Overall Cognitive Status: Within Functional Limits for tasks assessed                                        Exercises Total Joint Exercises Ankle Circles/Pumps: AROM;15 reps;Supine;Both Quad Sets: AROM;Both;Supine;15 reps Heel Slides: AAROM;Left;Supine;15 reps Straight Leg Raises: AAROM;Left;10 reps;Supine Goniometric ROM: L knee -5 - 80    General Comments        Pertinent Vitals/Pain Pain Assessment: 0-10 Pain Score: 6  Pain Location: L knee Pain Descriptors / Indicators: Aching;Sore Pain Intervention(s): Limited activity within patient's tolerance;Monitored during session;Premedicated before session    Home Living                      Prior Function            PT Goals (current goals can now be found in the care plan section) Acute Rehab PT Goals Patient Stated Goal: Regain IND PT Goal Formulation: With patient Time For Goal Achievement: 06/16/20 Potential to Achieve Goals: Good Progress towards PT goals: Progressing  toward goals    Frequency    7X/week      PT Plan Current plan remains appropriate    Co-evaluation              AM-PAC PT "6 Clicks" Mobility   Outcome Measure  Help needed turning from your back to your side while in a flat bed without using bedrails?: A Lot Help needed moving from lying on your back to sitting on the side of a flat bed without using bedrails?: A Lot Help needed moving to and from a bed to a chair (including a wheelchair)?: A Lot Help needed standing up from a chair using your arms (e.g., wheelchair or bedside chair)?: A Lot Help needed to walk in hospital room?: A Lot Help needed climbing 3-5 steps with  a railing? : A Lot 6 Click Score: 12    End of Session Equipment Utilized During Treatment: Gait belt;Left knee immobilizer Activity Tolerance: Patient tolerated treatment well Patient left: in chair;with call bell/phone within reach;with chair alarm set Nurse Communication: Mobility status PT Visit Diagnosis: Unsteadiness on feet (R26.81);Muscle weakness (generalized) (M62.81);Difficulty in walking, not elsewhere classified (R26.2);Pain Pain - Right/Left: Left Pain - part of body: Knee     Time: 0819-0850 PT Time Calculation (min) (ACUTE ONLY): 31 min  Charges:  $Therapeutic Exercise: 8-22 mins $Therapeutic Activity: 8-22 mins                     [name] PT Acute Rehabilitation Services Pager [phone] Office [phone]    Royal Vandevoort 06/10/2020, 8:58 AM

## 2020-06-10 NOTE — Plan of Care (Signed)
  Problem: Clinical Measurements: Goal: Respiratory complications will improve Outcome: Progressing   Problem: Clinical Measurements: Goal: Cardiovascular complication will be avoided Outcome: Progressing   Problem: Pain Managment: Goal: General experience of comfort will improve Outcome: Progressing   Problem: Activity: Goal: Ability to avoid complications of mobility impairment will improve Outcome: Progressing   Problem: Activity: Goal: Range of joint motion will improve Outcome: Progressing

## 2020-06-10 NOTE — Progress Notes (Signed)
Orthopedic Tech Progress Note Patient Details:  [name] 03/12/1956 [id number]  Ortho Devices Type of Ortho Device: CPM padding Ortho Device/Splint Location: left Ortho Device/Splint Interventions: Application   Post Interventions Patient Tolerated: Fair Instructions Provided: Adjustment of device,Care of device   [name] 06/10/2020, 2:51 PM

## 2020-06-10 NOTE — Progress Notes (Signed)
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   Follow Up Recommendations  Follow surgeon's recommendation for DC plan and follow-up therapies;Home health PT     Equipment Recommendations  Rolling walker with 5" wheels (youth)    Recommendations for Other Services       Precautions / Restrictions Precautions Precautions: Knee;Fall Required Braces or Orthoses: Knee Immobilizer - Left Knee Immobilizer - Left: Discontinue once straight leg raise with < 10 degree lag Restrictions Weight Bearing Restrictions: No Other Position/Activity Restrictions: WBAT    Mobility  Bed Mobility               General bed mobility comments: Pt up in chair and requests back to same  Transfers Overall transfer level: Needs assistance Equipment used: Rolling walker (2 wheeled) Transfers: Sit to/from Stand Sit to Stand: Mod assist;Max assist         General transfer comment: cues for LE management and use of UEs to self assist;  Increased physical assist to bring wt up and fwd and to balance in standing.  3 attempts required to attain standing  Ambulation/Gait Ambulation/Gait assistance: Min assist Gait Distance (Feet): 68 Feet Assistive device: Rolling walker (2 wheeled) Gait Pattern/deviations: Step-to pattern;Decreased step length - right;Decreased step length - left;Shuffle;Trunk flexed Gait velocity: decr   General Gait Details: Increased time with cues for sequence, posture and position from [employer]             Wheelchair Mobility    Modified Rankin (Stroke Patients  Only)       Balance Overall balance assessment: Needs assistance Sitting-balance support: No upper extremity supported;Feet supported Sitting balance-Leahy Scale: Good     Standing balance support: Bilateral upper extremity supported Standing balance-Leahy Scale: Poor                              Cognition Arousal/Alertness: Awake/alert Behavior During Therapy: WFL for tasks assessed/performed Overall Cognitive Status: Within Functional Limits for tasks assessed                                        Exercises      General Comments        Pertinent Vitals/Pain Pain Assessment: 0-10 Pain Score: 4  Pain Location: L knee Pain Descriptors / Indicators: Aching;Sore Pain Intervention(s): Limited activity within patient's tolerance;Monitored during session;Premedicated before session;Ice applied    Home Living                      Prior Function            PT Goals (current goals can now be found in the care plan section) Acute Rehab PT Goals Patient Stated Goal: Regain IND PT Goal Formulation: With patient Time For Goal Achievement: 06/16/20 Potential to Achieve Goals: Good Progress towards PT goals: Progressing toward goals    Frequency    7X/week  PT Plan Current plan remains appropriate    Co-evaluation              AM-PAC PT "6 Clicks" Mobility   Outcome Measure  Help needed turning from your back to your side while in a flat bed without using bedrails?: A Lot Help needed moving from lying on your back to sitting on the side of a flat bed without using bedrails?: A Lot Help needed moving to and from a bed to a chair (including a wheelchair)?: A Lot Help needed standing up from a chair using your arms (e.g., wheelchair or bedside chair)?: A Lot Help needed to walk in hospital room?: A Little Help needed climbing 3-5 steps with a railing? : A Lot 6 Click Score: 13    End of Session Equipment Utilized  During Treatment: Gait belt;Left knee immobilizer Activity Tolerance: Patient tolerated treatment well Patient left: in chair;with call bell/phone within reach;with chair alarm set Nurse Communication: Mobility status PT Visit Diagnosis: Unsteadiness on feet (R26.81);Muscle weakness (generalized) (M62.81);Difficulty in walking, not elsewhere classified (R26.2);Pain Pain - Right/Left: Left Pain - part of body: Knee     Time: 9604-5409 PT Time Calculation (min) (ACUTE ONLY): 26 min  Charges:  $Gait Training: 23-37 mins                     Mauro Kaufmann PT Acute Rehabilitation Services Pager [phone] Office [phone]    Ediberto Sens 06/10/2020, 12:35 PM

## 2020-06-10 NOTE — Progress Notes (Signed)
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   Follow Up Recommendations  Follow surgeon's recommendation for DC plan and follow-up therapies;Home health PT     Equipment Recommendations  Rolling walker with 5" wheels    Recommendations for Other Services       Precautions / Restrictions Precautions Precautions: Knee;Fall Required Braces or Orthoses: Knee Immobilizer - Left Knee Immobilizer - Left: Discontinue once straight leg raise with < 10 degree lag Restrictions Weight Bearing Restrictions: No Other Position/Activity Restrictions: WBAT    Mobility  Bed Mobility Overal bed mobility: Needs Assistance Bed Mobility: Sit to Supine       Sit to supine: Min assist;Mod assist   General bed mobility comments: Increased time with cues for sequence and use of R LE to self assist  Transfers Overall transfer level: Needs assistance Equipment used: Rolling walker (2 wheeled) Transfers: Sit to/from Stand Sit to Stand: Mod assist         General transfer comment: cues for LE management and use of UEs to self assist;  Increased physical assist to bring wt up and fwd and to balance in standing.  2 attempts required to attain standing  Ambulation/Gait Ambulation/Gait assistance: Min assist Gait Distance (Feet): 80 Feet (and additional 15' from bathroom) Assistive device: Rolling walker (2 wheeled) Gait Pattern/deviations: Step-to pattern;Decreased step length - right;Decreased step length - left;Shuffle;Trunk flexed Gait velocity: decr   General Gait Details: Increased time with  cues for sequence, posture and position from [employer]             Wheelchair Mobility    Modified Rankin (Stroke Patients Only)       Balance Overall balance assessment: Needs assistance Sitting-balance support: No upper extremity supported;Feet supported Sitting balance-Leahy Scale: Good     Standing balance support: Bilateral upper extremity supported Standing balance-Leahy Scale: Poor                              Cognition Arousal/Alertness: Awake/alert Behavior During Therapy: WFL for tasks assessed/performed Overall Cognitive Status: Within Functional Limits for tasks assessed                                        Exercises      General Comments        Pertinent Vitals/Pain Pain Assessment: 0-10 Pain Score: 5  Pain Location: L knee Pain Descriptors / Indicators: Aching;Sore Pain Intervention(s): Limited activity within patient's tolerance;Monitored during session;Premedicated before session;Ice applied    Home Living                      Prior Function            PT Goals (current goals can now be found in the care plan section) Acute Rehab PT Goals Patient Stated Goal: Regain IND PT Goal Formulation: With patient Time For Goal Achievement: 06/16/20 Potential to Achieve Goals: Good Progress  towards PT goals: Progressing toward goals    Frequency    7X/week      PT Plan Current plan remains appropriate    Co-evaluation              AM-PAC PT "6 Clicks" Mobility   Outcome Measure  Help needed turning from your back to your side while in a flat bed without using bedrails?: A Lot Help needed moving from lying on your back to sitting on the side of a flat bed without using bedrails?: A Lot Help needed moving to and from a bed to a chair (including a wheelchair)?: A Lot Help needed standing up from a chair using your arms (e.g., wheelchair or bedside chair)?: A Lot Help needed to walk in  hospital room?: A Little Help needed climbing 3-5 steps with a railing? : A Lot 6 Click Score: 13    End of Session Equipment Utilized During Treatment: Gait belt;Left knee immobilizer Activity Tolerance: Patient tolerated treatment well Patient left: in bed;with call bell/phone within reach Nurse Communication: Mobility status PT Visit Diagnosis: Unsteadiness on feet (R26.81);Muscle weakness (generalized) (M62.81);Difficulty in walking, not elsewhere classified (R26.2);Pain Pain - Right/Left: Left Pain - part of body: Knee     Time: 1610-9604 PT Time Calculation (min) (ACUTE ONLY): 30 min  Charges:  $Gait Training: 23-37 mins                     [name] PT Acute Rehabilitation Services Pager [phone] Office [phone]    [name] 06/10/2020, 4:20 PM

## 2020-06-10 NOTE — Progress Notes (Addendum)
Received referral for HHPT and a youth RW. Met with pt. She plans to return home with the support of her daughter and grand kids. She reports that the 3-in-1 BSC has been delivered. Referral made to [facility] prior to surgery and pt agrees with [facility] agency. Contacted Karmen Bongo with [facility] for DME referral. Darrel Reach with [facility] and confirmed [facility] referral.

## 2020-06-10 NOTE — Discharge Instructions (Signed)
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
## 2020-06-10 NOTE — Discharge Summary (Signed)
Patient ID: [name] MRN: [id number] DOB/AGE: 63-Sep-1957 36 y.o.  Admit date: 06/08/2020 Discharge date: 06/10/2020  Admission Diagnoses:  Active Problems:   S/P total knee arthroplasty, left   Discharge Diagnoses:  Same  Past Medical History:  Diagnosis Date  . Anxiety   . Arthritis   . Asthma   . Depression   . Dysrhythmia   . History of GI bleed    2010--- UPPER ESOPHAGITIS/ DUODERAL ULCER/ EROSION  . History of hiatal hernia   . History of kidney stones   . History of shingles    2013  . Myocardial infarction ([place]) 10 years ago   . Nephrolithiasis    BILATERAL  . Right ureteral stone   . Type 2 diabetes, diet controlled ([place])   . Urgency of urination     Surgeries: Procedure(s): LEFT TOTAL KNEE ARTHROPLASTY on 06/08/2020   Consultants:   Discharged Condition: Improved  Hospital Course: [name] is an 65 y.o. female who was admitted 06/08/2020 for operative treatment of<principal problem not specified>. Patient has severe unremitting pain that affects sleep, daily activities, and work/hobbies. After pre-op clearance the patient was taken to the operating room on 06/08/2020 and underwent  Procedure(s): LEFT TOTAL KNEE ARTHROPLASTY.    Patient was given perioperative antibiotics:  Anti-infectives (From admission, onward)   Start     Dose/Rate Route Frequency Ordered Stop   06/08/20 2000  ceFAZolin (ANCEF) IVPB 1 g/50 mL premix        1 g 100 mL/hr over 30 Minutes Intravenous Every 8 hours 06/08/20 1535 06/09/20 0404   06/08/20 1425  vancomycin (VANCOCIN) powder  Status:  Discontinued          As needed 06/08/20 1325 06/08/20 1647   06/08/20 1000  ceFAZolin (ANCEF) IVPB 2g/100 mL premix        2 g 200 mL/hr over 30 Minutes Intravenous On call to O.R. 06/08/20 0950 06/08/20 1232       Patient was given sequential compression devices, early ambulation, and chemoprophylaxis to prevent DVT.  Patient benefited maximally from hospital stay and there were no  complications.    Recent vital signs:  Patient Vitals for the past 24 hrs:  BP Temp Temp src Pulse Resp SpO2  06/10/20 0550 120/68 98.3 F (36.8 C) - 90 16 94 %  06/09/20 2130 115/64 98.2 F (36.8 C) - 96 16 91 %  06/09/20 1702 (!) 100/57 98.6 F (37 C) Oral 77 16 95 %  06/09/20 1348 (!) 83/52 98.4 F (36.9 C) Oral 77 18 94 %  06/09/20 1000 (!) 97/50 98.2 F (36.8 C) Oral 80 16 92 %     Recent laboratory studies:  Recent Labs    06/09/20 0329 06/10/20 0327  WBC 9.7 8.2  HGB 8.8* 8.5*  HCT 29.3* 28.1*  PLT 180 174     Discharge Medications:   Allergies as of 06/10/2020   No Known Allergies     Medication List    TAKE these medications   acetaminophen 325 MG tablet Commonly known as: TYLENOL Take 1-2 tablets (325-650 mg total) by mouth every 6 (six) hours as needed for mild pain (pain score 1-3 or temp > 100.5).   ADVANCED DIABETIC MULTIVITAMIN PO Take 1 tablet by mouth daily.   albuterol (2.5 MG/3ML) 0.083% nebulizer solution Commonly known as: PROVENTIL Take 2.5 mg by nebulization every 6 (six) hours as needed for wheezing or shortness of breath.   albuterol 108 (90 Base) MCG/ACT inhaler  Commonly known as: VENTOLIN HFA Inhale 2 puffs into the lungs every 4 (four) hours as needed for wheezing or shortness of breath. For shortness of breath   amitriptyline 10 MG tablet Commonly known as: ELAVIL Take 10 mg by mouth at bedtime.   ARIPiprazole 5 MG tablet Commonly known as: ABILIFY Take 5 mg by mouth daily.   aspirin EC 81 MG tablet Take 1 tablet (81 mg total) by mouth daily.   blood glucose meter kit and supplies Dispense based on patient and insurance preference. Use up to four times daily as directed. (FOR ICD-10 E10.9, E11.9).   gabapentin 800 MG tablet Commonly known as: NEURONTIN Take 800 mg by mouth 3 (three) times daily.   glipiZIDE 5 MG tablet Commonly known as: GLUCOTROL Take 2.5 mg by mouth daily before breakfast.   Levemir FlexTouch 100  UNIT/ML FlexPen Generic drug: insulin detemir Inject 24 Units into the skin at bedtime.   Magnesium 500 MG Tabs Take 500 mg by mouth at bedtime.   oxyCODONE 10 mg 12 hr tablet Commonly known as: OXYCONTIN Take 1 tablet (10 mg total) by mouth every 12 (twelve) hours.   oxyCODONE-acetaminophen 10-325 MG tablet Commonly known as: PERCOCET Take 1 tablet by mouth 4 (four) times daily as needed. For breakthrough pain.   Ozempic (1 MG/DOSE) 2 MG/1.5ML Sopn Generic drug: Semaglutide (1 MG/DOSE) Inject 1 mg into the skin once a week. Saturday   Ozempic (1 MG/DOSE) 4 MG/3ML Sopn Generic drug: Semaglutide (1 MG/DOSE) Inject 4 mg into the muscle every Saturday.   sertraline 100 MG tablet Commonly known as: ZOLOFT Take 100 mg by mouth at bedtime.   tizanidine 6 MG capsule Commonly known as: ZANAFLEX Take 6 mg by mouth every 8 (eight) hours as needed for muscle spasms.   vitamin B-12 1000 MCG tablet Commonly known as: CYANOCOBALAMIN Take 1,000 mcg by mouth daily.   zolpidem 5 MG tablet Commonly known as: AMBIEN Take 5 mg by mouth at bedtime as needed for sleep.            Durable Medical Equipment  (From admission, onward)         Start     Ordered   06/09/20 1459  For home use only DME Walker rolling  Once       Comments: Patient needs a youth rolling walker; hers is old and falling apart from 10 years ago  Question Answer Comment  Walker: With [place]   Patient needs a walker to treat with the following condition Surgery, elective      06/09/20 1459          Diagnostic Studies: No results found.  Disposition: Discharge disposition: 01-Home or Self Care       Discharge Instructions    Call MD / Call 911   Complete by: As directed    If you experience chest pain or shortness of breath, CALL 911 and be transported to the hospital emergency room.  If you develope a fever above 101 F, pus (white drainage) or increased drainage or redness at the wound, or  calf pain, call your surgeon's office.   Constipation Prevention   Complete by: As directed    Drink plenty of fluids.  Prune juice may be helpful.  You may use a stool softener, such as Colace (over the counter) 100 mg twice a day.  Use MiraLax (over the counter) for constipation as needed.   Diet - low sodium heart healthy   Complete  by: As directed    Discharge instructions   Complete by: As directed    You may shower, dressing is waterproof.  Do not remove the dressing, we will remove it at your first post-op appointment.  Do not take a bath or soak the knee in a tub or pool.  You may weightbear as you can tolerate on the operative leg with a walker.  Continue using the CPM machine 3 times per day for one hour each time, increasing the degrees of range of motion daily.  Use the blue cradle boot under your heel to work on getting your leg straight.  Do NOT put a pillow under your knee.  You will follow-up with Dr. Marlou Sa in the clinic in 2 weeks on 06/22/2020 at your given appointment date.    Dental Antibiotics:  In most cases prophylactic antibiotics for Dental procdeures after total joint surgery are not necessary.  Exceptions are as follows:  1. History of prior total joint infection  2. Severely immunocompromised (Organ Transplant, cancer chemotherapy, Rheumatoid biologic meds such as [place])  3. Poorly controlled diabetes (A1C &gt; 8.0, blood glucose over 200)  If you have one of these conditions, contact your surgeon for an antibiotic prescription, prior to your dental procedure.   Increase activity slowly as tolerated   Complete by: As directed        Follow-up Information    [facility] Follow up.   Specialty: [place] Why: This agency will provide home physical therapy services. The agency will contact you prior to the first visit.  Contact information: [address] [place] [address] [phone]                Signed: Aundra Dubin 06/10/2020, 8:53 AM

## 2020-06-11 DIAGNOSIS — M1712 Unilateral primary osteoarthritis, left knee: Secondary | ICD-10-CM | POA: Diagnosis not present

## 2020-06-11 LAB — GLUCOSE, CAPILLARY
Glucose-Capillary: 142 mg/dL — ABNORMAL HIGH (ref 70–99)
Glucose-Capillary: 188 mg/dL — ABNORMAL HIGH (ref 70–99)
Glucose-Capillary: 170 mg/dL — ABNORMAL HIGH (ref 70–99)
Glucose-Capillary: 137 mg/dL — ABNORMAL HIGH (ref 70–99)
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
## 2020-06-11 NOTE — Plan of Care (Signed)
  Problem: Clinical Measurements: Goal: Respiratory complications will improve Outcome: Progressing   Problem: Clinical Measurements: Goal: Cardiovascular complication will be avoided Outcome: Progressing   Problem: Pain Managment: Goal: General experience of comfort will improve Outcome: Progressing   Problem: Safety: Goal: Ability to remain free from injury will improve Outcome: Progressing   Problem: Pain Management: Goal: Pain level will decrease with appropriate interventions Outcome: Progressing

## 2020-06-11 NOTE — Progress Notes (Signed)
Orthopedic Tech Progress Note Patient Details:  [name] 29-Mar-1956 [id number]  Ortho Devices Type of Ortho Device: CPM padding Ortho Device/Splint Location: on cpm Ortho Device/Splint Interventions: Application   Post Interventions Patient Tolerated: Fair Instructions Provided: Adjustment of device,Care of device   [name] 06/11/2020, 5:43 PM

## 2020-06-11 NOTE — Progress Notes (Signed)
Physical Therapy Treatment Patient Details Name: [name] MRN: [id number] DOB: [birth year] Today's Date: 06/11/2020    History of Present Illness Pt s/p L TKR and with hx of DM, MI, and R TKR in 2018 with revisioni in 2013    PT Comments    Pt continues to require increased time for all tasks and ambulated decreased distance in hall this pm but demonstrating marked improvement in transfers sit<>stand and bed mobility.   Follow Up Recommendations  Follow surgeon's recommendation for DC plan and follow-up therapies;Home health PT     Equipment Recommendations  Rolling walker with 5" wheels    Recommendations for Other Services       Precautions / Restrictions Precautions Precautions: Knee;Fall Required Braces or Orthoses: Knee Immobilizer - Left Knee Immobilizer - Left: Discontinue once straight leg raise with < 10 degree lag Restrictions Weight Bearing Restrictions: No Other Position/Activity Restrictions: WBAT    Mobility  Bed Mobility Overal bed mobility: Needs Assistance Bed Mobility: Sit to Supine       Sit to supine: Min guard   General bed mobility comments: Increased time with pt self assisting R LE with belt  Transfers Overall transfer level: Needs assistance Equipment used: Rolling walker (2 wheeled) Transfers: Sit to/from Stand Sit to Stand: Min assist         General transfer comment: cues for LE management and use of UEs to self assist;  Physical assist to bring wt up and fwd and to balance in standing.  Ambulation/Gait Ambulation/Gait assistance: Min assist;Min guard Gait Distance (Feet): 78 Feet Assistive device: Rolling walker (2 wheeled) Gait Pattern/deviations: Step-to pattern;Decreased step length - right;Decreased step length - left;Shuffle;Trunk flexed Gait velocity: decr   General Gait Details: Increased time with cues for sequence, posture and position from [employer]             Wheelchair Mobility    Modified Rankin  (Stroke Patients Only)       Balance Overall balance assessment: Needs assistance Sitting-balance support: No upper extremity supported;Feet supported Sitting balance-Leahy Scale: Good     Standing balance support: Bilateral upper extremity supported Standing balance-Leahy Scale: Fair                              Cognition Arousal/Alertness: Awake/alert Behavior During Therapy: WFL for tasks assessed/performed Overall Cognitive Status: Within Functional Limits for tasks assessed                                        Exercises      General Comments        Pertinent Vitals/Pain Pain Assessment: 0-10 Pain Score: 5  Pain Location: L knee Pain Descriptors / Indicators: Aching;Sore Pain Intervention(s): Limited activity within patient's tolerance;Monitored during session;Premedicated before session    Home Living                      Prior Function            PT Goals (current goals can now be found in the care plan section) Acute Rehab PT Goals Patient Stated Goal: Regain IND PT Goal Formulation: With patient Time For Goal Achievement: 06/16/20 Potential to Achieve Goals: Good Progress towards PT goals: Progressing toward goals    Frequency    7X/week      PT  Plan Current plan remains appropriate    Co-evaluation              AM-PAC PT "6 Clicks" Mobility   Outcome Measure  Help needed turning from your back to your side while in a flat bed without using bedrails?: A Little Help needed moving from lying on your back to sitting on the side of a flat bed without using bedrails?: A Little Help needed moving to and from a bed to a chair (including a wheelchair)?: A Little Help needed standing up from a chair using your arms (e.g., wheelchair or bedside chair)?: A Little Help needed to walk in hospital room?: A Little Help needed climbing 3-5 steps with a railing? : A Lot 6 Click Score: 17    End of Session  Equipment Utilized During Treatment: Gait belt Activity Tolerance: Patient tolerated treatment well Patient left: in bed;with call bell/phone within reach;with bed alarm set Nurse Communication: Mobility status PT Visit Diagnosis: Unsteadiness on feet (R26.81);Muscle weakness (generalized) (M62.81);Difficulty in walking, not elsewhere classified (R26.2);Pain Pain - Right/Left: Left Pain - part of body: Knee     Time: 8657-8469 PT Time Calculation (min) (ACUTE ONLY): 26 min  Charges:  $Gait Training: 8-22 mins $Therapeutic Activity: 8-22 mins                     Mauro Kaufmann PT Acute Rehabilitation Services Pager [phone] Office [phone]    Yoanna Jurczyk 06/11/2020, 2:41 PM

## 2020-06-11 NOTE — Progress Notes (Signed)
Physical Therapy Treatment Patient Details Name: [name] MRN: [id number] DOB: Nov 14, 1955 Today's Date: 06/11/2020    History of Present Illness Pt s/p L TKR and with hx of DM, MI, and R TKR in 2018 with revisioni in 2013    PT Comments    Pt progressing this morning with noted improvement in activity tolerance and with decreased assist for most tasks.   Follow Up Recommendations  Follow surgeon's recommendation for DC plan and follow-up therapies;Home health PT     Equipment Recommendations  Rolling walker with 5" wheels    Recommendations for Other Services       Precautions / Restrictions Precautions Precautions: Knee;Fall Required Braces or Orthoses: Knee Immobilizer - Left Knee Immobilizer - Left: Discontinue once straight leg raise with < 10 degree lag Restrictions Weight Bearing Restrictions: No Other Position/Activity Restrictions: WBAT    Mobility  Bed Mobility               General bed mobility comments: up in bathroom with nursing  Transfers Overall transfer level: Needs assistance Equipment used: Rolling walker (2 wheeled) Transfers: Sit to/from Stand Sit to Stand: Min assist;Mod assist         General transfer comment: cues for LE management and use of UEs to self assist;  Increased physical assist to bring wt up and fwd and to balance in standing.  Ambulation/Gait Ambulation/Gait assistance: Min assist;Min guard Gait Distance (Feet): 120 Feet Assistive device: Rolling walker (2 wheeled) Gait Pattern/deviations: Step-to pattern;Decreased step length - right;Decreased step length - left;Shuffle;Trunk flexed Gait velocity: decr   General Gait Details: Increased time with min cues for sequence, posture and position from [employer]             Wheelchair Mobility    Modified Rankin (Stroke Patients Only)       Balance Overall balance assessment: Needs assistance Sitting-balance support: No upper extremity supported;Feet  supported Sitting balance-Leahy Scale: Good     Standing balance support: Bilateral upper extremity supported Standing balance-Leahy Scale: Fair                              Cognition Arousal/Alertness: Awake/alert Behavior During Therapy: WFL for tasks assessed/performed Overall Cognitive Status: Within Functional Limits for tasks assessed                                        Exercises Total Joint Exercises Ankle Circles/Pumps: AROM;15 reps;Supine;Both Quad Sets: AROM;Both;Supine;15 reps Heel Slides: AAROM;Left;Supine;20 reps Straight Leg Raises: AAROM;Left;Supine;20 reps Goniometric ROM: -5 - 85    General Comments        Pertinent Vitals/Pain Pain Assessment: 0-10 Pain Score: 4  Pain Location: L knee Pain Descriptors / Indicators: Aching;Sore Pain Intervention(s): Limited activity within patient's tolerance;Monitored during session;Premedicated before session;Ice applied    Home Living                      Prior Function            PT Goals (current goals can now be found in the care plan section) Acute Rehab PT Goals Patient Stated Goal: Regain IND PT Goal Formulation: With patient Time For Goal Achievement: 06/16/20 Potential to Achieve Goals: Good Progress towards PT goals: Progressing toward goals    Frequency    7X/week  PT Plan Current plan remains appropriate    Co-evaluation              AM-PAC PT "6 Clicks" Mobility   Outcome Measure  Help needed turning from your back to your side while in a flat bed without using bedrails?: A Lot Help needed moving from lying on your back to sitting on the side of a flat bed without using bedrails?: A Lot Help needed moving to and from a bed to a chair (including a wheelchair)?: A Lot Help needed standing up from a chair using your arms (e.g., wheelchair or bedside chair)?: A Lot Help needed to walk in hospital room?: A Little Help needed climbing 3-5  steps with a railing? : A Lot 6 Click Score: 13    End of Session Equipment Utilized During Treatment: Gait belt Activity Tolerance: Patient tolerated treatment well Patient left: in chair;with call bell/phone within reach;with chair alarm set Nurse Communication: Mobility status PT Visit Diagnosis: Unsteadiness on feet (R26.81);Muscle weakness (generalized) (M62.81);Difficulty in walking, not elsewhere classified (R26.2);Pain Pain - Right/Left: Left Pain - part of body: Knee     Time: 1610-9604 PT Time Calculation (min) (ACUTE ONLY): 38 min  Charges:  $Gait Training: 23-37 mins $Therapeutic Exercise: 8-22 mins                     Mauro Kaufmann PT Acute Rehabilitation Services Pager [phone] Office [phone]    Yulia Ulrich 06/11/2020, 9:38 AM

## 2020-06-11 NOTE — Progress Notes (Signed)
Orthopedic Tech Progress Note Patient Details:  [name] Oct 24, 1955 [id number]  Ortho Devices Type of Ortho Device: CPM padding Ortho Device/Splint Location: on cpm Ortho Device/Splint Interventions: Application   Post Interventions Patient Tolerated: Fair Instructions Provided: Adjustment of device,Care of device   [name] 06/11/2020, 2:27 PM

## 2020-06-11 NOTE — Plan of Care (Signed)
  Problem: Education: Goal: Knowledge of General Education information will improve Description: Including pain rating scale, medication(s)/side effects and non-pharmacologic comfort measures Outcome: Progressing   Problem: Health Behavior/Discharge Planning: Goal: Ability to manage health-related needs will improve Outcome: Progressing   Problem: Clinical Measurements: Goal: Ability to maintain clinical measurements within normal limits will improve Outcome: Progressing Goal: Will remain free from infection Outcome: Progressing Goal: Diagnostic test results will improve Outcome: Progressing Goal: Respiratory complications will improve Outcome: Progressing Goal: Cardiovascular complication will be avoided Outcome: Progressing   Problem: Activity: Goal: Risk for activity intolerance will decrease Outcome: Progressing   Problem: Coping: Goal: Level of anxiety will decrease Outcome: Progressing   Problem: Elimination: Goal: Will not experience complications related to bowel motility Outcome: Progressing Goal: Will not experience complications related to urinary retention Outcome: Progressing   Problem: Pain Managment: Goal: General experience of comfort will improve Outcome: Progressing   Problem: Safety: Goal: Ability to remain free from injury will improve Outcome: Progressing   Problem: Skin Integrity: Goal: Risk for impaired skin integrity will decrease Outcome: Progressing   Problem: Activity: Goal: Ability to avoid complications of mobility impairment will improve Outcome: Progressing Goal: Range of joint motion will improve Outcome: Progressing

## 2020-06-12 ENCOUNTER — Encounter (HOSPITAL_COMMUNITY): Payer: Self-pay | Admitting: Orthopedic Surgery

## 2020-06-12 DIAGNOSIS — M1712 Unilateral primary osteoarthritis, left knee: Secondary | ICD-10-CM | POA: Diagnosis not present

## 2020-06-12 LAB — GLUCOSE, CAPILLARY
Glucose-Capillary: 155 mg/dL — ABNORMAL HIGH (ref 70–99)
Glucose-Capillary: 139 mg/dL — ABNORMAL HIGH (ref 70–99)
Glucose-Capillary: 211 mg/dL — ABNORMAL HIGH (ref 70–99)
Glucose-Capillary: 92 mg/dL (ref 70–99)

## 2020-06-12 NOTE — Progress Notes (Signed)
Patient discharged to home w/ family. Given all belongings, instructions, equipment. Verbalized understanding of instructions. Escorted to pov via w/c. 
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
## 2020-06-12 NOTE — Progress Notes (Signed)
Physical Therapy Treatment Patient Details Name: [name] MRN: [id number] DOB: [birth year] Today's Date: 06/12/2020    History of Present Illness Pt s/p L TKR and with hx of DM, MI, and R TKR in 2018 with revisioni in 2013    PT Comments    POD # 4 Assisted OOB.  General bed mobility comments: with increased time pt was self able to slide LE using her belt.  General transfer comment: 25% VC's on proper hand placement and increased time pt was self able to get off bed as well as toilet transfer. General Gait Details: Increased time with cues for sequence, posture and position from RW.  Practiced 2 steps, pt has 4 steps to enter her home.  General stair comments: 25% VC's on proper sequencing as well as safety with heavy lean on rail.  Pt plans to D/C back to her home with family support.   Addressed all mobility questions, discussed appropriate activity, educated on use of ICE.  Pt ready for D/C to home.    Follow Up Recommendations  Follow surgeon's recommendation for DC plan and follow-up therapies;Home health PT     Equipment Recommendations  Rolling walker with 5" wheels    Recommendations for Other Services       Precautions / Restrictions Precautions Precautions: Knee Precaution Comments: instructed no pillow under knee Restrictions Weight Bearing Restrictions: No Other Position/Activity Restrictions: WBAT    Mobility  Bed Mobility Overal bed mobility: Needs Assistance Bed Mobility: Supine to Sit     Supine to sit: Supervision     General bed mobility comments: with increased time pt was self able to slide LE using her belt  Transfers Overall transfer level: Needs assistance Equipment used: Rolling walker (2 wheeled) Transfers: Sit to/from [employer] Sit to Stand: Supervision Stand pivot transfers: Supervision       General transfer comment: 25% VC's on proper hand placement and increased time pt was self able to get off bed as well as  toilet transfer  Ambulation/Gait Ambulation/Gait assistance: Supervision;Min guard Gait Distance (Feet): 45 Feet Assistive device: Rolling walker (2 wheeled) Gait Pattern/deviations: Step-to pattern;Decreased step length - right;Decreased step length - left;Shuffle;Trunk flexed Gait velocity: decr   General Gait Details: Increased time with cues for sequence, posture and position from RW   Stairs Stairs: Yes Stairs assistance: Min guard Stair Management: One rail Left;Step to pattern;Forwards Number of Stairs: 2 General stair comments: 25% VC's on proper sequencing as well as safety with heavy lean on rail   Wheelchair Mobility    Modified Rankin (Stroke Patients Only)       Balance                                            Cognition Arousal/Alertness: Awake/alert Behavior During Therapy: WFL for tasks assessed/performed Overall Cognitive Status: Within Functional Limits for tasks assessed                                 General Comments: AxO x 3 very pleasant      Exercises      General Comments        Pertinent Vitals/Pain Pain Assessment: 0-10 Pain Score: 5  Pain Location: L knee Pain Descriptors / Indicators: Aching;Sore Pain Intervention(s): Monitored during session;Premedicated before session;Repositioned;Ice applied  Home Living                      Prior Function            PT Goals (current goals can now be found in the care plan section) Progress towards PT goals: Progressing toward goals    Frequency    7X/week      PT Plan Current plan remains appropriate    Co-evaluation              AM-PAC PT "6 Clicks" Mobility   Outcome Measure  Help needed turning from your back to your side while in a flat bed without using bedrails?: None Help needed moving from lying on your back to sitting on the side of a flat bed without using bedrails?: None Help needed moving to and from a bed to  a chair (including a wheelchair)?: None Help needed standing up from a chair using your arms (e.g., wheelchair or bedside chair)?: None Help needed to walk in hospital room?: None Help needed climbing 3-5 steps with a railing? : A Little 6 Click Score: 23    End of Session Equipment Utilized During Treatment: Gait belt Activity Tolerance: Patient tolerated treatment well Patient left: in chair;with call bell/phone within reach Nurse Communication: Mobility status (pt ready for D/C to home) PT Visit Diagnosis: Unsteadiness on feet (R26.81);Muscle weakness (generalized) (M62.81);Difficulty in walking, not elsewhere classified (R26.2);Pain Pain - Right/Left: Left Pain - part of body: Knee     Time: 0454-0981 PT Time Calculation (min) (ACUTE ONLY): 27 min  Charges:  $Gait Training: 8-22 mins $Therapeutic Activity: 8-22 mins            Felecia Shelling  PTA Acute  Rehabilitation Services Pager      [phone] Office      [phone]

## 2020-06-14 ENCOUNTER — Telehealth: Payer: Self-pay

## 2020-06-14 NOTE — Telephone Encounter (Signed)
Sounds okay, she was asymptomatic in hospital with similar readings.  No syncope?

## 2020-06-14 NOTE — Telephone Encounter (Signed)
[name] with [facility] PT called stated patients BP is historically low but wanted to let you know that the highest reading that they could get from patient is 90/60 at rest and 100/60 after exercise.

## 2020-06-15 NOTE — Telephone Encounter (Signed)
No syncope. Dorene Sorrow just said that they were required to notify our office.

## 2020-06-21 ENCOUNTER — Telehealth: Payer: Self-pay | Admitting: Orthopedic Surgery

## 2020-06-21 NOTE — Telephone Encounter (Signed)
Followup when possible, okay to see me Friday if needed

## 2020-06-21 NOTE — Telephone Encounter (Signed)
Received call from Vivia Budge (PT) from [facility] advised he saw patient today. Tom advised patient had low normal BP. Patient had difficulty with Transfer and Gait. [name] said coming back in the house it took a lot to get patient back in the house. Tom said he don't  believe family could get patient back in the house. [name] said daughter wasn't aware of the appointment tomorrow and said she could'nt get off work. The number to contact [name] is [phone]

## 2020-06-21 NOTE — Telephone Encounter (Signed)
See below

## 2020-06-22 ENCOUNTER — Inpatient Hospital Stay: Payer: Medicare HMO | Admitting: Orthopedic Surgery

## 2020-06-22 NOTE — Telephone Encounter (Signed)
Ok thx.

## 2020-06-22 NOTE — Telephone Encounter (Signed)
I spoke with patient. Offered appointment today and tomorrow. She is unable to get transportation. She said she would not be able to come in until her already scheduled appointment on Feb 2

## 2020-06-23 ENCOUNTER — Telehealth: Payer: Self-pay | Admitting: Orthopedic Surgery

## 2020-06-23 NOTE — Telephone Encounter (Signed)
Received call from [employer] advised patient canceled on him today. Patient wanted to know if she can move discharge to the first of next week? The number to contact Rolm Gala is [phone]

## 2020-06-26 NOTE — Telephone Encounter (Signed)
Please advise. Thanks.  

## 2020-06-28 ENCOUNTER — Telehealth: Payer: Self-pay

## 2020-06-28 ENCOUNTER — Telehealth: Payer: Self-pay | Admitting: Orthopedic Surgery

## 2020-06-28 ENCOUNTER — Inpatient Hospital Stay: Payer: Medicare HMO | Admitting: Orthopedic Surgery

## 2020-06-28 NOTE — Telephone Encounter (Signed)
Discuss at appointment tomorrow

## 2020-06-28 NOTE — Telephone Encounter (Signed)
error 

## 2020-06-28 NOTE — Telephone Encounter (Signed)
IC patient to check on her because she has either rescheduled or no showed several appointments. She was scheduled for 3pm this afternoon but as of 3:15 she had not arrived. I called to check on patient. She stated she was in a lot of pain and that she was having dizzy spells that she was advised by HHPT was related to her low BP.  I emphasized importance of her keeping her appointments so that we could check her knee since she has not been seen since her surgery several weeks ago. Also advised she needed to contact her PCP about her BP concerns. She said that she has called them several times and left messages but not heard back from them yet.  I rescheduled her to be seen in the morning.

## 2020-06-28 NOTE — Telephone Encounter (Signed)
thx

## 2020-06-29 ENCOUNTER — Ambulatory Visit (INDEPENDENT_AMBULATORY_CARE_PROVIDER_SITE_OTHER): Payer: Medicare HMO | Admitting: Orthopedic Surgery

## 2020-06-29 ENCOUNTER — Other Ambulatory Visit: Payer: Self-pay

## 2020-06-29 ENCOUNTER — Ambulatory Visit (INDEPENDENT_AMBULATORY_CARE_PROVIDER_SITE_OTHER): Payer: Medicare HMO

## 2020-06-29 DIAGNOSIS — Z96652 Presence of left artificial knee joint: Secondary | ICD-10-CM | POA: Diagnosis not present

## 2020-06-30 ENCOUNTER — Encounter: Payer: Self-pay | Admitting: Orthopedic Surgery

## 2020-06-30 NOTE — Progress Notes (Signed)
Post-Op Visit Note   Patient: [name]           Date of Birth: 03/19/1956           MRN: [id number] Visit Date: 06/29/2020 PCP: Jasmine Paris, NP   Assessment & Plan:  Chief Complaint:  Chief Complaint  Patient presents with  . Left Knee - Routine Post Op   Visit Diagnoses:  1. S/P total knee arthroplasty, left     Plan: [name] is a patient who is now about 2 weeks out left total knee replacement. She is ambulating with a walker. On exam the incision is intact. Range of motion 0-1 15. Getting home health physical therapy. No calf tenderness negative Homans today. Radiographs look good. Plan at this time is continue with range of motion exercises. Outpatient PT may not be required for [facility] due to her excellent range of motion at this time. We'll see her back in 4 weeks and make the final determination about necessity for outpatient therapy. As long she continues to make gains with her strengthening which she may be able to do on her own I think we can allow [name] to decide about necessity for physical therapy as it is a financial and pragmatic burden for her to arrange.  Follow-Up Instructions: No follow-ups on file.   Orders:  Orders Placed This Encounter  Procedures  . XR Knee 1-2 Views Left   No orders of the defined types were placed in this encounter.   Imaging: No results found.  PMFS History: Patient Active Problem List   Diagnosis Date Noted  . S/P total knee arthroplasty, 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  . Nephrolithiasis 07/08/2017  . Abnormal EKG 06/18/2017  . Preop cardiovascular exam 06/18/2017  . History of myocardial infarction 06/18/2017  . Chronic joint pain 10/08/2016  . Asthma 10/08/2016  . History of kidney stones 10/08/2016  . Insomnia 10/08/2016  . Diabetes mellitus type 2, noninsulin dependent (HCC) 09/10/2013  . Obesity (BMI 30-39.9) 09/10/2013  . GERD 03/15/2009   Past Medical History:  Diagnosis Date  . Anxiety   . Arthritis   . Asthma   .  Depression   . Dysrhythmia   . History of GI bleed    2010--- UPPER ESOPHAGITIS/ DUODERAL ULCER/ EROSION  . History of hiatal hernia   . History of kidney stones   . History of shingles    2013  . Myocardial infarction (HCC) 10 years ago   . Nephrolithiasis    BILATERAL  . Right ureteral stone   . Type 2 diabetes, diet controlled (HCC)   . Urgency of urination     Family History  Problem Relation Age of Onset  . Heart disease Father     Past Surgical History:  Procedure Laterality Date  . CARDIAC CATHETERIZATION  02-10-2009  DR BENSIMHON   NORMAL CORONARIES/  LOW NORMAL LVF WITHOUT WALL MOTION ABNORMALITIES  . CARDIAC CATHETERIZATION  11-21-2010  DR Jacinto Halim   NORMAL CORONARIES/  EF 50-55%  . CHOLECYSTECTOMY  1990s   back in [place]   . CYSTOSCOPY W/ URETERAL STENT PLACEMENT Right 09/04/2013   Procedure: CYSTOSCOPY WITH RETROGRADE PYELOGRAM/URETERAL STENT PLACEMENT;  Surgeon: Milford Cage, MD;  Location: [facility];  Service: Urology;  Laterality: Right;  . CYSTOSCOPY WITH RETROGRADE PYELOGRAM, URETEROSCOPY AND STENT PLACEMENT Right 09/27/2013   Procedure: CYSTOSCOPY WITH RETROGRADE PYELOGRAM, URETEROSCOPY AND STENT EXCHANGE;  Surgeon: Milford Cage, MD;  Location: [facility];  Service: Urology;  Laterality: Right;  .  HOLMIUM LASER APPLICATION Right 09/27/2013   Procedure: HOLMIUM LASER APPLICATION;  Surgeon: Milford Cage, MD;  Location: [facility];  Service: Urology;  Laterality: Right;  . HYSTEROSCOPY WITH D & C  01-25-2003  . IR URETERAL STENT RIGHT NEW ACCESS W/O SEP NEPHROSTOMY CATH  07/08/2017  . LAPAROSCOPIC ASSISTED VAGINAL HYSTERECTOMY  03-02-2003   W/  BILATERAL SALPINGOOPHORECTOMY  . LAPAROSCOPY  EXTENSIVE LYSIS ADHESIONS/  REDO PARAESOPHAGEAL HIATAL HERNIA WITH PRIMARY CLOSURE AND MESH/ NISSEN FUNDOPLICATION (1cm)/  REPAIR GASTROTOMY  08-22-2009  . NEPHROLITHOTOMY Right 07/08/2017   Procedure: NEPHROLITHOTOMY  PERCUTANEOUS;  Surgeon: Jerilee Field, MD;  Location: [facility];  Service: Urology;  Laterality: Right;  . NISSEN FUNDOPLICATION  2000   W/  CHOLECYSTECTOMY  . SHOULDER ARTHROSCOPY WITH SUBACROMIAL DECOMPRESSION, ROTATOR CUFF REPAIR AND BICEP TENDON REPAIR Left 08-02-2010   AND LABRAL DEBRIDEMENT  . TOTAL KNEE ARTHROPLASTY Right 07-01-2006  . TOTAL KNEE ARTHROPLASTY Left 06/08/2020   Procedure: LEFT TOTAL KNEE ARTHROPLASTY;  Surgeon: Cammy Copa, MD;  Location: [facility];  Service: Orthopedics;  Laterality: Left;  . TOTAL KNEE REVISION  02/25/2012   Procedure: TOTAL KNEE REVISION;  Surgeon: Cammy Copa, MD;  Location[facility];  Service: Orthopedics;  Laterality: Right;  Revise right total knee arthroplasty  . TUBAL LIGATION  1990  . WRIST FUSION Left 2004   RETAINED HARDWARE   Social History   Occupational History  . Not on file  Tobacco Use  . Smoking status: Never Smoker  . Smokeless tobacco: Never Used  Vaping Use  . Vaping Use: Never used  Substance and Sexual Activity  . Alcohol use: No  . Drug use: No  . Sexual activity: Not on file    Comment: Hysterectomy

## 2020-07-24 ENCOUNTER — Other Ambulatory Visit: Payer: Self-pay

## 2020-07-24 ENCOUNTER — Emergency Department (HOSPITAL_COMMUNITY): Payer: Medicare HMO

## 2020-07-24 ENCOUNTER — Emergency Department (HOSPITAL_COMMUNITY)
Admission: EM | Admit: 2020-07-24 | Discharge: 2020-07-24 | Disposition: A | Discharge status: Home / Self Care | Payer: Medicare HMO | Attending: Emergency Medicine | Admitting: Emergency Medicine

## 2020-07-24 ENCOUNTER — Encounter (HOSPITAL_COMMUNITY): Payer: Self-pay | Admitting: *Deleted

## 2020-07-24 DIAGNOSIS — Z7984 Long term (current) use of oral hypoglycemic drugs: Secondary | ICD-10-CM | POA: Insufficient documentation

## 2020-07-24 DIAGNOSIS — Z794 Long term (current) use of insulin: Secondary | ICD-10-CM | POA: Diagnosis not present

## 2020-07-24 DIAGNOSIS — S0512XA Contusion of eyeball and orbital tissues, left eye, initial encounter: Secondary | ICD-10-CM | POA: Insufficient documentation

## 2020-07-24 DIAGNOSIS — S0990XA Unspecified injury of head, initial encounter: Secondary | ICD-10-CM | POA: Diagnosis present

## 2020-07-24 DIAGNOSIS — J45909 Unspecified asthma, uncomplicated: Secondary | ICD-10-CM | POA: Diagnosis not present

## 2020-07-24 DIAGNOSIS — Z7982 Long term (current) use of aspirin: Secondary | ICD-10-CM | POA: Insufficient documentation

## 2020-07-24 DIAGNOSIS — W101XXA Fall (on)(from) sidewalk curb, initial encounter: Secondary | ICD-10-CM | POA: Diagnosis not present

## 2020-07-24 DIAGNOSIS — Z96653 Presence of artificial knee joint, bilateral: Secondary | ICD-10-CM | POA: Insufficient documentation

## 2020-07-24 DIAGNOSIS — E669 Obesity, unspecified: Secondary | ICD-10-CM | POA: Diagnosis not present

## 2020-07-24 DIAGNOSIS — W19XXXA Unspecified fall, initial encounter: Secondary | ICD-10-CM

## 2020-07-24 DIAGNOSIS — S0083XA Contusion of other part of head, initial encounter: Secondary | ICD-10-CM

## 2020-07-24 DIAGNOSIS — E1169 Type 2 diabetes mellitus with other specified complication: Secondary | ICD-10-CM | POA: Insufficient documentation

## 2020-07-24 MED ORDER — ACETAMINOPHEN 500 MG PO TABS
1000.0000 mg | ORAL_TABLET | Freq: Once | ORAL | Status: AC
  Administered 2020-07-24: 1000 mg via ORAL
  Filled 2020-07-24: qty 2

## 2020-07-24 NOTE — ED Triage Notes (Signed)
Pt arrived by [facility] for a fall. She tripped and fell when ambulating, hit her head on concrete. Has hematoma to left forehead. No loc. Denies being on blood thinners.

## 2020-07-24 NOTE — ED Provider Notes (Signed)
[facility] DEPARTMENT Provider Note   CSN: [id number] Arrival date & time: 07/24/20  1509     History Chief Complaint  Patient presents with  . Fall    [name] is a 65 y.o. female w/ h/o T2DM, previous MI, kidney stones, and depression who presents to the ED for fall. Patient walking relative to Orthodontist clinic earlier this afternoon and missed the curb with her cane, causing her to fall to the ground and hit her head and face. Denies LOC. No prodromal symptoms. Able to get up and ambulate following the fall. Complaining of frontal headache and swelling over L eyebrow. No A/C. Denies vision changes, neck pain, chest pain, SOB, abdominal pain, or injuries to extremities. She reports chronic back that has not worsened since the fall.  The history is provided by the patient and medical records.  Fall This is a new problem. The current episode started 1 to 2 hours ago. The problem occurs rarely. The problem has been resolved. Associated symptoms include headaches. Pertinent negatives include no chest pain, no abdominal pain and no shortness of breath. Nothing aggravates the symptoms. Nothing relieves the symptoms. She has tried nothing for the symptoms.       Past Medical History:  Diagnosis Date  . Anxiety   . Arthritis   . Asthma   . Depression   . Dysrhythmia   . History of GI bleed    2010--- UPPER ESOPHAGITIS/ DUODERAL ULCER/ EROSION  . History of hiatal hernia   . History of kidney stones   . History of shingles    2013  . Myocardial infarction ([place]) 10 years ago   . Nephrolithiasis    BILATERAL  . Right ureteral stone   . Type 2 diabetes, diet controlled ([place])   . Urgency of urination     Patient Active Problem List   Diagnosis Date Noted  . S/P total knee arthroplasty, 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  . Nephrolithiasis 07/08/2017  . Abnormal EKG 06/18/2017  . Preop cardiovascular exam 06/18/2017  . History of myocardial infarction 06/18/2017   . Chronic joint pain 10/08/2016  . Asthma 10/08/2016  . History of kidney stones 10/08/2016  . Insomnia 10/08/2016  . Diabetes mellitus type 2, noninsulin dependent ([place]) 09/10/2013  . Obesity (BMI 30-39.9) 09/10/2013  . GERD 03/15/2009    Past Surgical History:  Procedure Laterality Date  . CARDIAC CATHETERIZATION  02-10-2009  DR BENSIMHON   NORMAL CORONARIES/  LOW NORMAL LVF WITHOUT WALL MOTION ABNORMALITIES  . CARDIAC CATHETERIZATION  11-21-2010  DR Einar Gip   NORMAL CORONARIES/  EF 50-55%  . CHOLECYSTECTOMY  1990s   back in [place]   . CYSTOSCOPY W/ URETERAL STENT PLACEMENT Right 09/04/2013   Procedure: CYSTOSCOPY WITH RETROGRADE PYELOGRAM/URETERAL STENT PLACEMENT;  Surgeon: Molli Hazard, MD;  Location: [facility];  Service: Urology;  Laterality: Right;  . CYSTOSCOPY WITH RETROGRADE PYELOGRAM, URETEROSCOPY AND STENT PLACEMENT Right 09/27/2013   Procedure: CYSTOSCOPY WITH RETROGRADE PYELOGRAM, URETEROSCOPY AND STENT EXCHANGE;  Surgeon: Molli Hazard, MD;  Location: [facility];  Service: Urology;  Laterality: Right;  . HOLMIUM LASER APPLICATION Right 06/30/8248   Procedure: HOLMIUM LASER APPLICATION;  Surgeon: Molli Hazard, MD;  Location: [facility];  Service: Urology;  Laterality: Right;  . HYSTEROSCOPY WITH D & C  01-25-2003  . IR URETERAL STENT RIGHT NEW ACCESS W/O SEP NEPHROSTOMY CATH  07/08/2017  . LAPAROSCOPIC ASSISTED VAGINAL HYSTERECTOMY  03-02-2003   W/  BILATERAL SALPINGOOPHORECTOMY  .  LAPAROSCOPY  EXTENSIVE LYSIS ADHESIONS/  REDO PARAESOPHAGEAL HIATAL HERNIA WITH PRIMARY CLOSURE AND MESH/ NISSEN FUNDOPLICATION (1cm)/  REPAIR GASTROTOMY  08-22-2009  . NEPHROLITHOTOMY Right 07/08/2017   Procedure: NEPHROLITHOTOMY PERCUTANEOUS;  Surgeon: Festus Aloe, MD;  Location: [facility];  Service: Urology;  Laterality: Right;  . NISSEN FUNDOPLICATION  5009   W/  CHOLECYSTECTOMY  . SHOULDER ARTHROSCOPY WITH SUBACROMIAL DECOMPRESSION,  ROTATOR CUFF REPAIR AND BICEP TENDON REPAIR Left 08-02-2010   AND LABRAL DEBRIDEMENT  . TOTAL KNEE ARTHROPLASTY Right 07-01-2006  . TOTAL KNEE ARTHROPLASTY Left 06/08/2020   Procedure: LEFT TOTAL KNEE ARTHROPLASTY;  Surgeon: Meredith Pel, MD;  Location: [facility];  Service: Orthopedics;  Laterality: Left;  . TOTAL KNEE REVISION  02/25/2012   Procedure: TOTAL KNEE REVISION;  Surgeon: Meredith Pel, MD;  Location: [facility];  Service: Orthopedics;  Laterality: Right;  Revise right total knee arthroplasty  . TUBAL LIGATION  1990  . WRIST FUSION Left 2004   RETAINED HARDWARE     OB History   No obstetric history on file.     Family History  Problem Relation Age of Onset  . Heart disease Father     Social History   Tobacco Use  . Smoking status: Never Smoker  . Smokeless tobacco: Never Used  Vaping Use  . Vaping Use: Never used  Substance Use Topics  . Alcohol use: No  . Drug use: No    Home Medications Prior to Admission medications   Medication Sig Start Date End Date Taking? Authorizing Provider  acetaminophen (TYLENOL) 325 MG tablet Take 1-2 tablets (325-650 mg total) by mouth every 6 (six) hours as needed for mild pain (pain score 1-3 or temp > 100.5). 06/09/20   Meredith Pel, MD  albuterol (PROVENTIL HFA;VENTOLIN HFA) 108 (90 Base) MCG/ACT inhaler Inhale 2 puffs into the lungs every 4 (four) hours as needed for wheezing or shortness of breath. For shortness of breath 01/28/17   Jearld Fenton, NP  albuterol (PROVENTIL) (2.5 MG/3ML) 0.083% nebulizer solution Take 2.5 mg by nebulization every 6 (six) hours as needed for wheezing or shortness of breath.    [provider]  amitriptyline (ELAVIL) 10 MG tablet Take 10 mg by mouth at bedtime.    [provider]  ARIPiprazole (ABILIFY) 5 MG tablet Take 5 mg by mouth daily.    [provider]  aspirin EC 81 MG tablet Take 1 tablet (81 mg total) by mouth daily. 07/14/17   Filippou, Braxton Feathers, MD   blood glucose meter kit and supplies Dispense based on patient and insurance preference. Use up to four times daily as directed. (FOR ICD-10 E10.9, E11.9). 09/26/17   Elby Beck, FNP  gabapentin (NEURONTIN) 800 MG tablet Take 800 mg by mouth 3 (three) times daily.    [provider]  glipiZIDE (GLUCOTROL) 5 MG tablet Take 2.5 mg by mouth daily before breakfast.    [provider]  insulin detemir (LEVEMIR FLEXTOUCH) 100 UNIT/ML FlexPen Inject 24 Units into the skin at bedtime.    [provider]  Magnesium 500 MG TABS Take 500 mg by mouth at bedtime.    [provider]  Multiple Vitamins-Minerals (ADVANCED DIABETIC MULTIVITAMIN PO) Take 1 tablet by mouth daily.    [provider]  oxyCODONE (OXYCONTIN) 10 mg 12 hr tablet Take 1 tablet (10 mg total) by mouth every 12 (twelve) hours. 06/09/20   Meredith Pel, MD  oxyCODONE-acetaminophen (PERCOCET) 10-325 MG tablet Take 1 tablet  by mouth 4 (four) times daily as needed. For breakthrough pain. 07/09/17   Filippou, Braxton Feathers, MD  OZEMPIC, 1 MG/DOSE, 4 MG/3ML SOPN Inject 4 mg into the muscle every Saturday. 03/26/20   [provider]  Semaglutide, 1 MG/DOSE, (OZEMPIC, 1 MG/DOSE,) 2 MG/1.5ML SOPN Inject 1 mg into the skin once a week. Saturday    [provider]  sertraline (ZOLOFT) 100 MG tablet Take 100 mg by mouth at bedtime.    [provider]  tizanidine (ZANAFLEX) 6 MG capsule Take 6 mg by mouth every 8 (eight) hours as needed for muscle spasms. 07/29/16   [provider]  vitamin B-12 (CYANOCOBALAMIN) 1000 MCG tablet Take 1,000 mcg by mouth daily.    [provider]  zolpidem (AMBIEN) 5 MG tablet Take 5 mg by mouth at bedtime as needed for sleep. 08/01/16   [provider]    Allergies    Patient has no known allergies.  Review of Systems   Review of Systems  Constitutional: Negative for chills and fever.  HENT: Positive for facial  swelling. Negative for ear pain and sore throat.   Eyes: Negative for pain and visual disturbance.  Respiratory: Negative for cough and shortness of breath.   Cardiovascular: Negative for chest pain and palpitations.  Gastrointestinal: Negative for abdominal pain and vomiting.  Genitourinary: Negative for dysuria and hematuria.  Musculoskeletal: Negative for arthralgias and back pain.  Skin: Positive for wound. Negative for color change and rash.  Neurological: Positive for headaches. Negative for seizures and syncope.  All other systems reviewed and are negative.   Physical Exam Updated Vital Signs BP 135/62 (BP Location: Left Arm)   Pulse 81   Temp 98.1 F (36.7 C) (Oral)   Resp 16   SpO2 97%   Physical Exam Vitals and nursing note reviewed.  Constitutional:      General: She is awake. She is not in acute distress.    Appearance: Normal appearance. She is well-developed, well-groomed and well-nourished. She is not ill-appearing.  HENT:     Head: Normocephalic.     Jaw: There is normal jaw occlusion. No trismus, tenderness, swelling, pain on movement or malocclusion.     Comments: Hematoma overlying L eyebrow with superficial abrasion and underlying tenderness. Tenderness over L zygomatic arch without deformity.    Right Ear: External ear normal.     Left Ear: External ear normal.     Nose: Nose normal. No congestion or rhinorrhea.     Mouth/Throat:     Lips: Pink. No lesions.     Mouth: Mucous membranes are moist. No injury or lacerations.     Pharynx: Oropharynx is clear. Uvula midline. No oropharyngeal exudate or posterior oropharyngeal erythema.  Eyes:     General: Vision grossly intact. No visual field deficit or scleral icterus.       Right eye: No discharge.        Left eye: No discharge.     Extraocular Movements: Extraocular movements intact.     Conjunctiva/sclera: Conjunctivae normal.     Pupils: Pupils are equal, round, and reactive to light.  Cardiovascular:      Rate and Rhythm: Normal rate and regular rhythm.  Pulmonary:     Effort: Pulmonary effort is normal. No respiratory distress.  Chest:     Chest wall: No tenderness.  Abdominal:     General: Abdomen is flat. There is no distension.     Palpations: Abdomen is soft.  Tenderness: There is no abdominal tenderness. There is no guarding or rebound.  Musculoskeletal:        General: No tenderness, signs of injury or edema.     Cervical back: Neck supple. No signs of trauma. No pain with movement, spinous process tenderness or muscular tenderness. Normal range of motion.     Right lower leg: No edema.     Left lower leg: No edema.     Comments: Extremities atraumatic and well perfused.  Skin:    General: Skin is warm and dry.     Findings: No rash.  Neurological:     General: No focal deficit present.     Mental Status: She is alert and oriented to person, place, and time.     GCS: GCS eye subscore is 4. GCS verbal subscore is 5. GCS motor subscore is 6.     Cranial Nerves: Cranial nerves are intact. No cranial nerve deficit, dysarthria or facial asymmetry.     Sensory: Sensation is intact. No sensory deficit.     Motor: Motor function is intact. No weakness.     Gait: Gait is intact.  Psychiatric:        Mood and Affect: Mood and affect normal.        Behavior: Behavior is cooperative.     ED Results / Procedures / Treatments   Labs (all labs ordered are listed, but only abnormal results are displayed) Labs Reviewed - No data to display  EKG None  Radiology CT Head Wo Contrast  Result Date: 07/24/2020 CLINICAL DATA:  Head trauma, moderate/severe; fall. Hematoma to left forehead. EXAM: CT HEAD WITHOUT CONTRAST CT MAXILLOFACIAL WITHOUT CONTRAST TECHNIQUE: Multidetector CT imaging of the head and maxillofacial structures were performed using the standard protocol without intravenous contrast. Multiplanar CT image reconstructions of the maxillofacial structures were also  generated. COMPARISON:  Brain MRI 11/07/2017.  Head CT 11/07/2017. FINDINGS: CT HEAD FINDINGS Brain: There is no acute intracranial hemorrhage. No demarcated cortical infarct. No extra-axial fluid collection. No evidence of intracranial mass. No midline shift. Vascular: No hyperdense vessel.  Atherosclerotic calcifications. Skull: No calvarial fracture. Unchanged nonspecific sclerotic foci within the frontal and right parietal calvarium. CT MAXILLOFACIAL FINDINGS Osseous: No acute maxillofacial fracture is identified Orbits: No acute abnormality within the orbits. The globes are normal in size and contour. The extraocular muscles and optic nerve sheath complexes are symmetric and unremarkable. Sinuses: Subcentimeter left frontal sinus osteoma. Otherwise, no significant paranasal sinus disease. Soft tissues: Prominent left forehead to left periorbital hematoma. IMPRESSION: CT head: No evidence of acute intracranial abnormality. CT maxillofacial: 1. No evidence of acute maxillofacial fracture. 2. Prominent left forehead/periorbital hematoma. Electronically Signed   By: Kellie Simmering DO   On: 07/24/2020 19:31   CT Maxillofacial Wo Contrast  Result Date: 07/24/2020 CLINICAL DATA:  Head trauma, moderate/severe; fall. Hematoma to left forehead. EXAM: CT HEAD WITHOUT CONTRAST CT MAXILLOFACIAL WITHOUT CONTRAST TECHNIQUE: Multidetector CT imaging of the head and maxillofacial structures were performed using the standard protocol without intravenous contrast. Multiplanar CT image reconstructions of the maxillofacial structures were also generated. COMPARISON:  Brain MRI 11/07/2017.  Head CT 11/07/2017. FINDINGS: CT HEAD FINDINGS Brain: There is no acute intracranial hemorrhage. No demarcated cortical infarct. No extra-axial fluid collection. No evidence of intracranial mass. No midline shift. Vascular: No hyperdense vessel.  Atherosclerotic calcifications. Skull: No calvarial fracture. Unchanged nonspecific sclerotic foci  within the frontal and right parietal calvarium. CT MAXILLOFACIAL FINDINGS Osseous: No acute maxillofacial fracture is identified  Orbits: No acute abnormality within the orbits. The globes are normal in size and contour. The extraocular muscles and optic nerve sheath complexes are symmetric and unremarkable. Sinuses: Subcentimeter left frontal sinus osteoma. Otherwise, no significant paranasal sinus disease. Soft tissues: Prominent left forehead to left periorbital hematoma. IMPRESSION: CT head: No evidence of acute intracranial abnormality. CT maxillofacial: 1. No evidence of acute maxillofacial fracture. 2. Prominent left forehead/periorbital hematoma. Electronically Signed   By: Kellie Simmering DO   On: 07/24/2020 19:31    Procedures Procedures  Medications Ordered in ED Medications  acetaminophen (TYLENOL) tablet 1,000 mg (has no administration in time range)    ED Course  I have reviewed the triage vital signs and the nursing notes.  Pertinent labs & imaging results that were available during my care of the patient were reviewed by me and considered in my medical decision making (see chart for details).    MDM Rules/Calculators/A&P                          Patient is a 42yoF with history and physical as described above who presents to the ED for fall and facial injury. VS reassuring and HDS. Patient resting comfortably and no acute distress. GCS 15 with no focal neuro deficits. Initial workup includes CT face and head. Initial treatment includes Tylenol.  CT head unremarkable. CT face notable for L forehead and periorbital hematoma. Doubt ICH, skull fracture, spinal injury, intrathoracic injury, intraabdominal injury, fracture, or dislocation at this time. Patient otherwise HDS and appropriate for discharge. Strict return precautions provided and discussed. Questions and concerns addressed. Recommend follow up with PCP on outpatient basis as needed. Patient verbalized understanding and  amenable with discharge plan. Discharged in stable condition.  Final Clinical Impression(s) / ED Diagnoses Final diagnoses:  Fall, initial encounter  Contusion of face, initial encounter    Rx / DC Orders ED Discharge Orders    None       Christy Gentles, MD 07/24/20 2011    Margette Fast, MD 08/03/20 1003

## 2020-07-27 ENCOUNTER — Ambulatory Visit: Payer: Medicare HMO | Admitting: Orthopedic Surgery

## 2020-10-12 ENCOUNTER — Telehealth: Payer: Self-pay | Admitting: Orthopedic Surgery

## 2020-10-12 NOTE — Telephone Encounter (Signed)
Patient's daughter [name] called to let Dr August Saucer know that her mother has falling everyday since surgery back in January. Tammie asked for a call back as soon as possible. Tammie said her mother stays in the bed or in a chair most of the time. Tammie said her mother does not have an strength to lift in her right leg. The number to contact Tammie is [phone]

## 2020-10-13 ENCOUNTER — Ambulatory Visit: Payer: Medicare HMO | Admitting: Orthopedic Surgery

## 2020-10-14 ENCOUNTER — Emergency Department (HOSPITAL_COMMUNITY): Payer: Medicare Other

## 2020-10-14 ENCOUNTER — Inpatient Hospital Stay (HOSPITAL_COMMUNITY)
Admission: EM | Admit: 2020-10-14 | Discharge: 2020-10-26 | DRG: 472 | Disposition: A | Discharge status: Home Health | Payer: Medicare Other | Attending: Internal Medicine | Admitting: Internal Medicine

## 2020-10-14 ENCOUNTER — Other Ambulatory Visit: Payer: Self-pay

## 2020-10-14 DIAGNOSIS — M2578 Osteophyte, vertebrae: Secondary | ICD-10-CM | POA: Diagnosis present

## 2020-10-14 DIAGNOSIS — R008 Other abnormalities of heart beat: Secondary | ICD-10-CM | POA: Diagnosis not present

## 2020-10-14 DIAGNOSIS — M4802 Spinal stenosis, cervical region: Principal | ICD-10-CM | POA: Diagnosis present

## 2020-10-14 DIAGNOSIS — Z6829 Body mass index (BMI) 29.0-29.9, adult: Secondary | ICD-10-CM

## 2020-10-14 DIAGNOSIS — M47816 Spondylosis without myelopathy or radiculopathy, lumbar region: Secondary | ICD-10-CM | POA: Diagnosis present

## 2020-10-14 DIAGNOSIS — Y92009 Unspecified place in unspecified non-institutional (private) residence as the place of occurrence of the external cause: Secondary | ICD-10-CM | POA: Diagnosis not present

## 2020-10-14 DIAGNOSIS — Z9181 History of falling: Secondary | ICD-10-CM

## 2020-10-14 DIAGNOSIS — E785 Hyperlipidemia, unspecified: Secondary | ICD-10-CM | POA: Diagnosis present

## 2020-10-14 DIAGNOSIS — Z79899 Other long term (current) drug therapy: Secondary | ICD-10-CM

## 2020-10-14 DIAGNOSIS — Z96652 Presence of left artificial knee joint: Secondary | ICD-10-CM

## 2020-10-14 DIAGNOSIS — Z794 Long term (current) use of insulin: Secondary | ICD-10-CM

## 2020-10-14 DIAGNOSIS — E119 Type 2 diabetes mellitus without complications: Secondary | ICD-10-CM | POA: Diagnosis not present

## 2020-10-14 DIAGNOSIS — R627 Adult failure to thrive: Secondary | ICD-10-CM | POA: Diagnosis present

## 2020-10-14 DIAGNOSIS — M50022 Cervical disc disorder at C5-C6 level with myelopathy: Secondary | ICD-10-CM | POA: Diagnosis present

## 2020-10-14 DIAGNOSIS — Z20822 Contact with and (suspected) exposure to covid-19: Secondary | ICD-10-CM | POA: Diagnosis present

## 2020-10-14 DIAGNOSIS — I959 Hypotension, unspecified: Secondary | ICD-10-CM | POA: Diagnosis present

## 2020-10-14 DIAGNOSIS — I252 Old myocardial infarction: Secondary | ICD-10-CM | POA: Diagnosis not present

## 2020-10-14 DIAGNOSIS — W19XXXA Unspecified fall, initial encounter: Secondary | ICD-10-CM

## 2020-10-14 DIAGNOSIS — Z8619 Personal history of other infectious and parasitic diseases: Secondary | ICD-10-CM

## 2020-10-14 DIAGNOSIS — M2550 Pain in unspecified joint: Secondary | ICD-10-CM | POA: Diagnosis not present

## 2020-10-14 DIAGNOSIS — Z7984 Long term (current) use of oral hypoglycemic drugs: Secondary | ICD-10-CM

## 2020-10-14 DIAGNOSIS — Z7982 Long term (current) use of aspirin: Secondary | ICD-10-CM

## 2020-10-14 DIAGNOSIS — M48061 Spinal stenosis, lumbar region without neurogenic claudication: Secondary | ICD-10-CM | POA: Diagnosis present

## 2020-10-14 DIAGNOSIS — Z981 Arthrodesis status: Secondary | ICD-10-CM

## 2020-10-14 DIAGNOSIS — J45909 Unspecified asthma, uncomplicated: Secondary | ICD-10-CM | POA: Diagnosis present

## 2020-10-14 DIAGNOSIS — R296 Repeated falls: Secondary | ICD-10-CM | POA: Diagnosis present

## 2020-10-14 DIAGNOSIS — M7989 Other specified soft tissue disorders: Secondary | ICD-10-CM | POA: Diagnosis present

## 2020-10-14 DIAGNOSIS — I493 Ventricular premature depolarization: Secondary | ICD-10-CM | POA: Diagnosis not present

## 2020-10-14 DIAGNOSIS — J4520 Mild intermittent asthma, uncomplicated: Secondary | ICD-10-CM | POA: Diagnosis not present

## 2020-10-14 DIAGNOSIS — K219 Gastro-esophageal reflux disease without esophagitis: Secondary | ICD-10-CM | POA: Diagnosis present

## 2020-10-14 DIAGNOSIS — Z96653 Presence of artificial knee joint, bilateral: Secondary | ICD-10-CM | POA: Diagnosis present

## 2020-10-14 DIAGNOSIS — E1142 Type 2 diabetes mellitus with diabetic polyneuropathy: Secondary | ICD-10-CM | POA: Diagnosis present

## 2020-10-14 DIAGNOSIS — F419 Anxiety disorder, unspecified: Secondary | ICD-10-CM | POA: Diagnosis present

## 2020-10-14 DIAGNOSIS — G9520 Unspecified cord compression: Secondary | ICD-10-CM | POA: Diagnosis not present

## 2020-10-14 DIAGNOSIS — E876 Hypokalemia: Secondary | ICD-10-CM | POA: Diagnosis present

## 2020-10-14 DIAGNOSIS — F32A Depression, unspecified: Secondary | ICD-10-CM | POA: Diagnosis present

## 2020-10-14 DIAGNOSIS — E1165 Type 2 diabetes mellitus with hyperglycemia: Secondary | ICD-10-CM | POA: Diagnosis present

## 2020-10-14 DIAGNOSIS — G8929 Other chronic pain: Secondary | ICD-10-CM | POA: Diagnosis present

## 2020-10-14 DIAGNOSIS — Z8249 Family history of ischemic heart disease and other diseases of the circulatory system: Secondary | ICD-10-CM

## 2020-10-14 DIAGNOSIS — M4712 Other spondylosis with myelopathy, cervical region: Secondary | ICD-10-CM

## 2020-10-14 DIAGNOSIS — R29898 Other symptoms and signs involving the musculoskeletal system: Secondary | ICD-10-CM

## 2020-10-14 DIAGNOSIS — J449 Chronic obstructive pulmonary disease, unspecified: Secondary | ICD-10-CM | POA: Diagnosis present

## 2020-10-14 DIAGNOSIS — Z79891 Long term (current) use of opiate analgesic: Secondary | ICD-10-CM

## 2020-10-14 DIAGNOSIS — I2510 Atherosclerotic heart disease of native coronary artery without angina pectoris: Secondary | ICD-10-CM | POA: Diagnosis present

## 2020-10-14 DIAGNOSIS — Z90710 Acquired absence of both cervix and uterus: Secondary | ICD-10-CM

## 2020-10-14 DIAGNOSIS — Z87442 Personal history of urinary calculi: Secondary | ICD-10-CM

## 2020-10-14 DIAGNOSIS — R531 Weakness: Secondary | ICD-10-CM | POA: Diagnosis present

## 2020-10-14 DIAGNOSIS — R9431 Abnormal electrocardiogram [ECG] [EKG]: Secondary | ICD-10-CM | POA: Diagnosis not present

## 2020-10-14 DIAGNOSIS — Z419 Encounter for procedure for purposes other than remedying health state, unspecified: Secondary | ICD-10-CM

## 2020-10-14 HISTORY — DX: Unspecified fall, initial encounter: W19.XXXA

## 2020-10-14 LAB — COMPREHENSIVE METABOLIC PANEL
ALT: 19 U/L (ref 0–44)
AST: 20 U/L (ref 15–41)
Albumin: 3.7 g/dL (ref 3.5–5.0)
Alkaline Phosphatase: 121 U/L (ref 38–126)
Anion gap: 6 (ref 5–15)
BUN: 17 mg/dL (ref 8–23)
CO2: 32 mmol/L (ref 22–32)
Calcium: 9.7 mg/dL (ref 8.9–10.3)
Chloride: 106 mmol/L (ref 98–111)
Creatinine, Ser: 0.65 mg/dL (ref 0.44–1.00)
GFR, Estimated: >60 mL/min (ref >60)
Glucose, Bld: 267 mg/dL — ABNORMAL HIGH (ref 70–99)
Potassium: 3.7 mmol/L (ref 3.5–5.1)
Sodium: 144 mmol/L (ref 135–145)
Total Bilirubin: 0.3 mg/dL (ref 0.3–1.2)
Total Protein: 7.2 g/dL (ref 6.5–8.1)

## 2020-10-14 LAB — CBC WITH DIFFERENTIAL/PLATELET
Abs Immature Granulocytes: 0.02 10*3/uL (ref 0.00–0.07)
Basophils Absolute: 0 10*3/uL (ref 0.0–0.1)
Basophils Relative: 0 %
Eosinophils Absolute: 0.4 10*3/uL (ref 0.0–0.5)
Eosinophils Relative: 5 %
HCT: 37.2 % (ref 36.0–46.0)
Hemoglobin: 11.5 g/dL — ABNORMAL LOW (ref 12.0–15.0)
Immature Granulocytes: 0 %
Lymphocytes Relative: 16 %
Lymphs Abs: 1.1 10*3/uL (ref 0.7–4.0)
MCH: 26.6 pg (ref 26.0–34.0)
MCHC: 30.9 g/dL (ref 30.0–36.0)
MCV: 86.1 fL (ref 80.0–100.0)
Monocytes Absolute: 0.6 10*3/uL (ref 0.1–1.0)
Monocytes Relative: 8 %
Neutro Abs: 5 10*3/uL (ref 1.7–7.7)
Neutrophils Relative %: 71 %
Platelets: 219 10*3/uL (ref 150–400)
RBC: 4.32 MIL/uL (ref 3.87–5.11)
RDW: 15.6 % — ABNORMAL HIGH (ref 11.5–15.5)
WBC: 7.1 10*3/uL (ref 4.0–10.5)
nRBC: 0 % (ref 0.0–0.2)

## 2020-10-14 LAB — TROPONIN I (HIGH SENSITIVITY)
Troponin I (High Sensitivity): 21 ng/L — ABNORMAL HIGH (ref <18)
Troponin I (High Sensitivity): 20 ng/L — ABNORMAL HIGH (ref <18)

## 2020-10-14 LAB — SEDIMENTATION RATE: Sed Rate: 63 mm/hr — ABNORMAL HIGH (ref 0–22)

## 2020-10-14 LAB — CK: Total CK: 104 U/L (ref 38–234)

## 2020-10-14 LAB — MAGNESIUM: Magnesium: 1.6 mg/dL — ABNORMAL LOW (ref 1.7–2.4)

## 2020-10-14 LAB — C-REACTIVE PROTEIN: CRP: 2 mg/dL — ABNORMAL HIGH (ref <1.0)

## 2020-10-14 MED ORDER — MAGNESIUM SULFATE 2 GM/50ML IV SOLN
2.0000 g | Freq: Once | INTRAVENOUS | Status: AC
  Administered 2020-10-14: 2 g via INTRAVENOUS
  Filled 2020-10-14: qty 50

## 2020-10-14 NOTE — ED Triage Notes (Signed)
Brought in by EMS from home c/o multiple falls today and yesterday. Per EMS pt fell on her bottom and unable to get up by herself. Patient denies LOC, N/V. Pt sustain abrasion on elbow from the fall yesterday. Pt a/ox4.

## 2020-10-14 NOTE — ED Notes (Signed)
ED TO INPATIENT HANDOFF REPORT  Name/Age/Gender [name] 65 y.o. female  Code Status Code Status History    Date Active Date Inactive Code Status Order ID Comments User Context   06/08/2020 1811 06/12/2020 2251 Full Code [id number]  Domingo Dimes Inpatient   02/25/2012 1539 02/28/2012 1637 Full Code [id number]  Lavina Hamman., RN Inpatient   Advance Care Planning Activity    Questions for Most Recent Historical Code Status (Order [id number])       Home/SNF/Other Home  Chief Complaint Weakness [R53.1]  Level of Care/Admitting Diagnosis ED Disposition    ED Disposition Condition Comment   Admit  Hospital Area: [facility] [[id number]]  Level of Care: Med-Surg [16]  May admit patient to [facility] or [facility] if equivalent level of care is available:: Yes  Covid Evaluation: Asymptomatic Screening Protocol (No Symptoms)  Diagnosis: Weakness [202542]  Admitting Physician: Rometta Emery [[id number]]  Attending Physician: Rometta Emery [[id number]]  Estimated length of stay: past midnight tomorrow  Certification:: I certify this patient will need inpatient services for at least 2 midnights       Medical History Past Medical History:  Diagnosis Date  . Anxiety   . Arthritis   . Asthma   . Depression   . Dysrhythmia   . History of GI bleed    2010--- UPPER ESOPHAGITIS/ DUODERAL ULCER/ EROSION  . History of hiatal hernia   . History of kidney stones   . History of shingles    2013  . Myocardial infarction (HCC) 10 years ago   . Nephrolithiasis    BILATERAL  . Right ureteral stone   . Type 2 diabetes, diet controlled (HCC)   . Urgency of urination     Allergies No Known Allergies  IV Location/Drains/Wounds Patient Lines/Drains/Airways Status    Active Line/Drains/Airways    Name Placement date Placement time Site Days   Peripheral IV 10/14/20 Left Antecubital 10/14/20  1554  Antecubital  less than 1   Incision (Closed)  06/08/20 Knee Left 06/08/20  1337  -- 128          Labs/Imaging Results for orders placed or performed during the hospital encounter of 10/14/20 (from the past 48 hour(s))  Comprehensive metabolic panel     Status: Abnormal   Collection Time: 10/14/20  3:53 PM  Result Value Ref Range   Sodium 144 135 - 145 mmol/L   Potassium 3.7 3.5 - 5.1 mmol/L   Chloride 106 98 - 111 mmol/L   CO2 32 22 - 32 mmol/L   Glucose, Bld 267 (H) 70 - 99 mg/dL    Comment: Glucose reference range applies only to samples taken after fasting for at least 8 hours.   BUN 17 8 - 23 mg/dL   Creatinine, Ser 7.06 0.44 - 1.00 mg/dL   Calcium 9.7 8.9 - 23.7 mg/dL   Total Protein 7.2 6.5 - 8.1 g/dL   Albumin 3.7 3.5 - 5.0 g/dL   AST 20 15 - 41 U/L   ALT 19 0 - 44 U/L   Alkaline Phosphatase 121 38 - 126 U/L   Total Bilirubin 0.3 0.3 - 1.2 mg/dL   GFR, Estimated >62 >83 mL/min    Comment: (NOTE) Calculated using the CKD-EPI Creatinine Equation (2021)    Anion gap 6 5 - 15    Comment: Performed at [facility], [address] [place], [place] [address]  CBC with Differential  Status: Abnormal   Collection Time: 10/14/20  3:53 PM  Result Value Ref Range   WBC 7.1 4.0 - 10.5 K/uL   RBC 4.32 3.87 - 5.11 MIL/uL   Hemoglobin 11.5 (L) 12.0 - 15.0 g/dL   HCT 59.5 63.8 - 75.6 %   MCV 86.1 80.0 - 100.0 fL   MCH 26.6 26.0 - 34.0 pg   MCHC 30.9 30.0 - 36.0 g/dL   RDW 43.3 (H) 29.5 - 18.8 %   Platelets 219 150 - 400 K/uL   nRBC 0.0 0.0 - 0.2 %   Neutrophils Relative % 71 %   Neutro Abs 5.0 1.7 - 7.7 K/uL   Lymphocytes Relative 16 %   Lymphs Abs 1.1 0.7 - 4.0 K/uL   Monocytes Relative 8 %   Monocytes Absolute 0.6 0.1 - 1.0 K/uL   Eosinophils Relative 5 %   Eosinophils Absolute 0.4 0.0 - 0.5 K/uL   Basophils Relative 0 %   Basophils Absolute 0.0 0.0 - 0.1 K/uL   Immature Granulocytes 0 %   Abs Immature Granulocytes 0.02 0.00 - 0.07 K/uL    Comment: Performed at [facility], [address], [place], [place] [address]  CK     Status: None   Collection Time: 10/14/20  3:53 PM  Result Value Ref Range   Total CK 104 38 - 234 U/L    Comment: Performed at [facility], [address] [place], [place] [address]  Magnesium     Status: Abnormal   Collection Time: 10/14/20  3:53 PM  Result Value Ref Range   Magnesium 1.6 (L) 1.7 - 2.4 mg/dL    Comment: Performed at [facility], [address] [place], [place] [address]  Troponin I (High Sensitivity)     Status: Abnormal   Collection Time: 10/14/20  3:53 PM  Result Value Ref Range   Troponin I (High Sensitivity) 21 (H) <18 ng/L    Comment: (NOTE) Elevated high sensitivity troponin I (hsTnI) values and significant  changes across serial measurements may suggest ACS but many other  chronic and acute conditions are known to elevate hsTnI results.  Refer to the "Links" section for chest pain algorithms and additional  guidance. Performed at [facility], [address], [place], [place] [address]   Sedimentation rate     Status: Abnormal   Collection Time: 10/14/20  3:53 PM  Result Value Ref Range   Sed Rate 63 (H) 0 - 22 mm/hr    Comment: Performed at [facility], [address], [place], [place] [address]  C-reactive protein     Status: Abnormal   Collection Time: 10/14/20  3:53 PM  Result Value Ref Range   CRP 2.0 (H) <1.0 mg/dL    Comment: Performed at [facility], [address], [place], [place] [address]  Troponin I (High Sensitivity)     Status: Abnormal   Collection Time: 10/14/20  7:05 PM  Result Value Ref Range   Troponin I (High Sensitivity) 20 (H) <18 ng/L    Comment: (NOTE) Elevated high sensitivity troponin I (hsTnI) values and significant  changes across serial measurements may suggest ACS but many other  chronic and acute conditions are known to elevate hsTnI results.  Refer to the  "Links" section for chest pain algorithms and additional  guidance. Performed at [facility], [address] [place], [place] [address]    DG Chest 2 View  Result Date: 10/14/2020 CLINICAL DATA:  Fall EXAM: CHEST - 2 VIEW COMPARISON:  09/26/2016 FINDINGS: Mild hyperinflation. Heart and mediastinal contours are within normal limits. No focal opacities or effusions. No acute bony abnormality. IMPRESSION: Mild hyperinflation.  No active cardiopulmonary disease. Electronically Signed   By: Charlett NoseKevin  Dover M.D.   On: 10/14/2020 16:37   DG Thoracic Spine 2 View  Result Date: 10/14/2020 CLINICAL DATA:  Fall, pain EXAM: THORACIC SPINE 2 VIEWS COMPARISON:  None. FINDINGS: Degenerative changes throughout the thoracic spine with disc space narrowing and spurring. Normal alignment. No fracture or focal bone lesion. IMPRESSION: Degenerative changes.  No acute bony abnormality Electronically Signed   By: Charlett NoseKevin  Dover M.D.   On: 10/14/2020 16:36   DG Lumbar Spine Complete  Result Date: 10/14/2020 CLINICAL DATA:  Fall, pain EXAM: LUMBAR SPINE - COMPLETE 4+ VIEW COMPARISON:  None. FINDINGS: Diffuse degenerative disc and facet disease. 4 mm anterolisthesis of L5 on S1 related to degenerative facet disease. No fracture. SI joints symmetric and unremarkable. IMPRESSION: Diffuse degenerative disc and facet disease. No acute bony abnormality. Electronically Signed   By: Charlett NoseKevin  Dover M.D.   On: 10/14/2020 16:36   MR LUMBAR SPINE WO CONTRAST  Result Date: 10/14/2020 CLINICAL DATA:  Chronic low back pain with bilateral radiculopathy EXAM: MRI LUMBAR SPINE WITHOUT CONTRAST TECHNIQUE: Multiplanar, multisequence MR imaging of the lumbar spine was performed. No intravenous contrast was administered. COMPARISON:  MRI 10/01/2017 FINDINGS: Segmentation:  Standard. Alignment: 5 mm grade 1 anterolisthesis L5 on S1. Trace retrolisthesis of T12 on L1 and L1 on L2. Vertebrae: No fracture, evidence of discitis, or  bone lesion. Discogenic endplate marrow changes at L4-5. Conus medullaris and cauda equina: Conus extends to the L2 level. Conus and cauda equina appear normal. Paraspinal and other soft tissues: No acute abnormality. Disc levels: T12-L1: Mild circumferential disc bulge and endplate spurring, most pronounced within the left foraminal zone. No canal stenosis. Mild left foraminal stenosis. Unchanged. L1-L2: Progressive disc height loss with diffuse disc osteophyte complex. Mild bilateral facet arthropathy. Mild canal stenosis with mild bilateral foraminal stenosis. Findings progressed from prior. L2-L3: Mild disc bulge, slightly eccentric to the right. Mild bilateral facet hypertrophy. Mild right foraminal stenosis. No canal stenosis. Unchanged. L3-L4: No disc protrusion. Mild bilateral facet hypertrophy. No foraminal or canal stenosis. Unchanged. L4-L5: Progressive disc height loss with circumferential disc bulge and endplate spurring. Bilateral facet arthropathy and ligamentum flavum buckling. Findings contribute to moderate canal stenosis with severe bilateral foraminal stenosis. Findings progressed from prior. L5-S1: Anterolisthesis with disc uncovering and diffuse disc bulge. Bilateral facet arthropathy. Severe bilateral foraminal stenosis. No canal stenosis. Unchanged. IMPRESSION: 1. Multilevel lumbar spondylosis, as above. Findings have progressed at the L1-2 and L4-5 levels. 2. Moderate canal stenosis and severe bilateral foraminal stenosis at L4-5. 3. Severe bilateral foraminal stenosis at L5-S1. Electronically Signed   By: Duanne GuessNicholas  Plundo D.O.   On: 10/14/2020 19:27    Pending Labs Unresulted Labs (From admission, onward)          Start     Ordered   10/14/20 2135  SARS CORONAVIRUS 2 (TAT 6-24 HRS) Nasopharyngeal Nasopharyngeal Swab  (Tier 3 - Symptomatic/asymptomatic)  Once,   STAT       Question Answer Comment  Is this test for diagnosis or screening Screening   Symptomatic for COVID-19 as  defined by CDC No   Hospitalized for COVID-19 No   Admitted to ICU for COVID-19 No   Previously tested for COVID-19 Yes  Resident in a congregate (group) care setting No   Employed in healthcare setting No   Pregnant No   Has patient completed COVID vaccination(s) (2 doses of Pfizer/Moderna 1 dose of [employer]) Unknown      10/14/20 2135   10/14/20 1536  Urinalysis, Routine w reflex microscopic Urine, Clean Catch  ONCE - STAT,   STAT        10/14/20 1540          Vitals/Pain Today's Vitals   10/14/20 1919 10/14/20 2000 10/14/20 2030 10/14/20 2130  BP: (!) 110/57 (!) 76/62 (!) 106/49 124/76  Pulse: 71 79 93 87  Resp: 18 18 16 18   Temp:      TempSrc:      SpO2: 95% 98% 95% 96%  Weight:      Height:        Isolation Precautions Airborne and Contact precautions  Medications Medications  magnesium sulfate IVPB 2 g 50 mL (0 g Intravenous Stopped 10/14/20 1936)    Mobility non-ambulatory

## 2020-10-14 NOTE — H&P (Signed)
History and Physical   SEMIRA STOLTZFUS HFW:[id number] DOB: 10/04/1955 DOA: 10/14/2020  Referring MD/NP/PA: Dr. Ralene Bathe  PCP: Simona Huh, NP   Outpatient Specialists: Marcene Duos, orthopedic surgery  Patient coming from: Home  Chief Complaint: Recurrent falls  HPI: ALYZZA Anderson is a 65 y.o. female with medical history significant of osteoarthritis status post bilateral knee replacement with last replacement in June 08, 2020 anxiety disorder, coronary artery disease, diabetes, kidney stone, depression, asthma, history of ureteral stents who presented to the ER today after sustaining 1 of several falls at home.  Patient reported that she has been progressively weak since her left total knee replacement in January.  In the last 1 week however it has gotten worse.  She noticed that her gait has been weak and shuffling.  Today however she fell and could not even get up on her feet.  She normally uses a walker or a cane.  She denied any injury.  Patient was initial work-up in the ER included MRI of the lumbar spine that showed spinal stenosis.  ER physician has discussed with neurosurgery who did not think this has anything to do with her weakness.  Patient appears to be weaker on the right compared to the left.  Patient is failing to thrive at home due to the weakness.  ED Course: Temperature 97.4, blood pressure 76/62 initially currently 110/71, pulse 93 respiratory 20 oxygen sat 93% on room air.  CBC showed hemoglobin 11.5 otherwise chemistry all within normal.  Glucose is 267.  Chest x-ray lumbar x-ray and thoracic spine x-rays showed no significant finding.  MRI of the lumbar spine showed multiple level lumbar spondylosis.  Findings at L1-L2 and L4-L5 levels.  There is some moderate canal stenosis and bilateral lateral foraminal stenosis which is severe at the level of the L4-5 as well as L5-S1.  Neurosurgery consulted Dr. Venetia Constable but recommends conservative management as the patient has  weakness  Review of Systems: As per HPI otherwise 10 point review of systems negative.    Past Medical History:  Diagnosis Date  . Anxiety   . Arthritis   . Asthma   . Depression   . Dysrhythmia   . History of GI bleed    2010--- UPPER ESOPHAGITIS/ DUODERAL ULCER/ EROSION  . History of hiatal hernia   . History of kidney stones   . History of shingles    2013  . Myocardial infarction ([place]) 10 years ago   . Nephrolithiasis    BILATERAL  . Right ureteral stone   . Type 2 diabetes, diet controlled ([place])   . Urgency of urination     Past Surgical History:  Procedure Laterality Date  . CARDIAC CATHETERIZATION  02-10-2009  DR BENSIMHON   NORMAL CORONARIES/  LOW NORMAL LVF WITHOUT WALL MOTION ABNORMALITIES  . CARDIAC CATHETERIZATION  11-21-2010  DR Einar Gip   NORMAL CORONARIES/  EF 50-55%  . CHOLECYSTECTOMY  1990s   back in [place]   . CYSTOSCOPY W/ URETERAL STENT PLACEMENT Right 09/04/2013   Procedure: CYSTOSCOPY WITH RETROGRADE PYELOGRAM/URETERAL STENT PLACEMENT;  Surgeon: Molli Hazard, MD;  Location: [facility];  Service: Urology;  Laterality: Right;  . CYSTOSCOPY WITH RETROGRADE PYELOGRAM, URETEROSCOPY AND STENT PLACEMENT Right 09/27/2013   Procedure: CYSTOSCOPY WITH RETROGRADE PYELOGRAM, URETEROSCOPY AND STENT EXCHANGE;  Surgeon: Molli Hazard, MD;  Location: [facility];  Service: Urology;  Laterality: Right;  . HOLMIUM LASER APPLICATION Right 0/06/7739   Procedure: HOLMIUM LASER APPLICATION;  Surgeon: Dennard Schaumann  Jasmine December, MD;  Location: [facility];  Service: Urology;  Laterality: Right;  . HYSTEROSCOPY WITH D & C  01-25-2003  . IR URETERAL STENT RIGHT NEW ACCESS W/O SEP NEPHROSTOMY CATH  07/08/2017  . LAPAROSCOPIC ASSISTED VAGINAL HYSTERECTOMY  03-02-2003   W/  BILATERAL SALPINGOOPHORECTOMY  . LAPAROSCOPY  EXTENSIVE LYSIS ADHESIONS/  REDO PARAESOPHAGEAL HIATAL HERNIA WITH PRIMARY CLOSURE AND MESH/ NISSEN FUNDOPLICATION (1cm)/  REPAIR  GASTROTOMY  08-22-2009  . NEPHROLITHOTOMY Right 07/08/2017   Procedure: NEPHROLITHOTOMY PERCUTANEOUS;  Surgeon: Festus Aloe, MD;  Location: [facility];  Service: Urology;  Laterality: Right;  . NISSEN FUNDOPLICATION  1694   W/  CHOLECYSTECTOMY  . SHOULDER ARTHROSCOPY WITH SUBACROMIAL DECOMPRESSION, ROTATOR CUFF REPAIR AND BICEP TENDON REPAIR Left 08-02-2010   AND LABRAL DEBRIDEMENT  . TOTAL KNEE ARTHROPLASTY Right 07-01-2006  . TOTAL KNEE ARTHROPLASTY Left 06/08/2020   Procedure: LEFT TOTAL KNEE ARTHROPLASTY;  Surgeon: Meredith Pel, MD;  Location: [facility];  Service: Orthopedics;  Laterality: Left;  . TOTAL KNEE REVISION  02/25/2012   Procedure: TOTAL KNEE REVISION;  Surgeon: Meredith Pel, MD;  Location: [facility];  Service: Orthopedics;  Laterality: Right;  Revise right total knee arthroplasty  . TUBAL LIGATION  1990  . WRIST FUSION Left 2004   RETAINED HARDWARE     reports that she has never smoked. She has never used smokeless tobacco. She reports that she does not drink alcohol and does not use drugs.  No Known Allergies  Family History  Problem Relation Age of Onset  . Heart disease Father      Prior to Admission medications   Medication Sig Start Date End Date Taking? Authorizing Provider  acetaminophen (TYLENOL) 325 MG tablet Take 1-2 tablets (325-650 mg total) by mouth every 6 (six) hours as needed for mild pain (pain score 1-3 or temp > 100.5). 06/09/20  Yes Dean, Tonna Corner, MD  albuterol (PROVENTIL HFA;VENTOLIN HFA) 108 (90 Base) MCG/ACT inhaler Inhale 2 puffs into the lungs every 4 (four) hours as needed for wheezing or shortness of breath. For shortness of breath 01/28/17  Yes Baity, Coralie Keens, NP  albuterol (PROVENTIL) (2.5 MG/3ML) 0.083% nebulizer solution Take 2.5 mg by nebulization every 6 (six) hours as needed for wheezing or shortness of breath.   Yes [provider]  amitriptyline (ELAVIL) 10 MG tablet Take 10 mg by mouth at bedtime.   Yes [provider]  ARIPiprazole (ABILIFY) 5 MG tablet Take 5 mg by mouth daily.   Yes [provider]  aspirin EC 81 MG tablet Take 1 tablet (81 mg total) by mouth daily. 07/14/17  Yes Filippou, Braxton Feathers, MD  blood glucose meter kit and supplies Dispense based on patient and insurance preference. Use up to four times daily as directed. (FOR ICD-10 E10.9, E11.9). 09/26/17  Yes Elby Beck, FNP  buprenorphine (BUTRANS) 10 MCG/HR PTWK Place 1 patch onto the skin once a week. Every Saturday 09/01/20  Yes [provider]  etodolac (LODINE XL) 600 MG 24 hr tablet Take 600 mg by mouth daily as needed. 10/08/20  Yes [provider]  fenofibrate 54 MG tablet Take 54 mg by mouth daily. 10/08/20  Yes [provider]  gabapentin (NEURONTIN) 800 MG tablet Take 800 mg by mouth 3 (three) times daily.   Yes [provider]  glipiZIDE (GLUCOTROL) 5 MG tablet Take 2.5 mg by mouth daily before breakfast.   Yes [provider]  insulin detemir (LEVEMIR FLEXTOUCH) 100 UNIT/ML  FlexPen Inject 24 Units into the skin at bedtime.   Yes [provider]  JANUMET 50-1000 MG tablet Take 1 tablet by mouth 2 (two) times daily. 07/19/20  Yes [provider]  Magnesium 500 MG TABS Take 500 mg by mouth at bedtime.   Yes [provider]  metaxalone (SKELAXIN) 800 MG tablet Take 800 mg by mouth 4 (four) times daily as needed. 08/01/20  Yes [provider]  Multiple Vitamins-Minerals (ADVANCED DIABETIC MULTIVITAMIN PO) Take 1 tablet by mouth daily.   Yes [provider]  oxyCODONE (OXYCONTIN) 10 mg 12 hr tablet Take 1 tablet (10 mg total) by mouth every 12 (twelve) hours. 06/09/20  Yes Meredith Pel, MD  oxyCODONE-acetaminophen (PERCOCET) 10-325 MG tablet Take 1 tablet by mouth 4 (four) times daily as needed. For breakthrough pain. 07/09/17  Yes Filippou, Braxton Feathers, MD  OZEMPIC, 1 MG/DOSE, 4 MG/3ML SOPN Inject 1 mg into the muscle every  Saturday. 03/26/20  Yes [provider]  pravastatin (PRAVACHOL) 80 MG tablet Take 80 mg by mouth daily. 10/08/20  Yes [provider]  sertraline (ZOLOFT) 100 MG tablet Take 100 mg by mouth at bedtime.   Yes [provider]  tiZANidine (ZANAFLEX) 4 MG capsule Take 4 mg by mouth every 8 (eight) hours as needed for muscle spasms. 07/29/16  Yes [provider]  vitamin B-12 (CYANOCOBALAMIN) 1000 MCG tablet Take 1,000 mcg by mouth daily.   Yes [provider]  zolpidem (AMBIEN) 5 MG tablet Take 5 mg by mouth at bedtime as needed for sleep. 08/01/16  Yes [provider]    Physical Exam: Vitals:   10/14/20 2000 10/14/20 2030 10/14/20 2130 10/14/20 2200  BP: (!) 76/62 (!) 106/49 124/76 135/78  Pulse: 79 93 87 80  Resp: '18 16 18 20  ' Temp:    98 F (36.7 C)  TempSrc:      SpO2: 98% 95% 96% 93%  Weight:      Height:          Constitutional: Acutely ill looking, no distress Vitals:   10/14/20 2000 10/14/20 2030 10/14/20 2130 10/14/20 2200  BP: (!) 76/62 (!) 106/49 124/76 135/78  Pulse: 79 93 87 80  Resp: '18 16 18 20  ' Temp:    98 F (36.7 C)  TempSrc:      SpO2: 98% 95% 96% 93%  Weight:      Height:       Eyes: PERRL, lids and conjunctivae normal ENMT: Mucous membranes are moist. Posterior pharynx clear of any exudate or lesions.Normal dentition.  Neck: normal, supple, no masses, no thyromegaly Respiratory: clear to auscultation bilaterally, no wheezing, no crackles. Normal respiratory effort. No accessory muscle use.  Cardiovascular: Regular rate and rhythm, no murmurs / rubs / gallops. No extremity edema. 2+ pedal pulses. No carotid bruits.  Abdomen: no tenderness, no masses palpated. No hepatosplenomegaly. Bowel sounds positive.  Musculoskeletal: no clubbing / cyanosis. No joint deformity upper and lower extremities. Good ROM, no contractures. Normal muscle tone.  Skin: no rashes, lesions, ulcers. No induration Neurologic: CN 2-12  grossly intact. Sensation intact, DTR normal. Strength 3 out of 5 right lower extremity and 5 out of 5 left lower extremity and upper extremities Psychiatric: Normal judgment and insight. Alert and oriented x 3. Normal mood.     Labs on Admission: I have personally reviewed following labs and imaging studies  CBC: Recent Labs  Lab 10/14/20 1553  WBC 7.1  NEUTROABS 5.0  HGB  11.5*  HCT 37.2  MCV 86.1  PLT 270   Basic Metabolic Panel: Recent Labs  Lab 10/14/20 1553  NA 144  K 3.7  CL 106  CO2 32  GLUCOSE 267*  BUN 17  CREATININE 0.65  CALCIUM 9.7  MG 1.6*   GFR: Estimated Creatinine Clearance: 70.7 mL/min (by C-G formula based on SCr of 0.65 mg/dL). Liver Function Tests: Recent Labs  Lab 10/14/20 1553  AST 20  ALT 19  ALKPHOS 121  BILITOT 0.3  PROT 7.2  ALBUMIN 3.7   No results for input(s): LIPASE, AMYLASE in the last 168 hours. No results for input(s): AMMONIA in the last 168 hours. Coagulation Profile: No results for input(s): INR, PROTIME in the last 168 hours. Cardiac Enzymes: Recent Labs  Lab 10/14/20 1553  CKTOTAL 104   BNP (last 3 results) No results for input(s): PROBNP in the last 8760 hours. HbA1C: No results for input(s): HGBA1C in the last 72 hours. CBG: No results for input(s): GLUCAP in the last 168 hours. Lipid Profile: No results for input(s): CHOL, HDL, LDLCALC, TRIG, CHOLHDL, LDLDIRECT in the last 72 hours. Thyroid Function Tests: No results for input(s): TSH, T4TOTAL, FREET4, T3FREE, THYROIDAB in the last 72 hours. Anemia Panel: No results for input(s): VITAMINB12, FOLATE, FERRITIN, TIBC, IRON, RETICCTPCT in the last 72 hours. Urine analysis:    Component Value Date/Time   COLORURINE YELLOW 06/08/2020 0950   APPEARANCEUR CLEAR 06/08/2020 0950   LABSPEC 1.013 06/08/2020 0950   PHURINE 6.0 06/08/2020 0950   GLUCOSEU NEGATIVE 06/08/2020 0950   HGBUR NEGATIVE 06/08/2020 0950   BILIRUBINUR NEGATIVE 06/08/2020 0950   BILIRUBINUR  negtive 05/29/2017 0705   KETONESUR NEGATIVE 06/08/2020 0950   PROTEINUR NEGATIVE 06/08/2020 0950   UROBILINOGEN 0.2 05/29/2017 0705   UROBILINOGEN 0.2 09/04/2013 1005   NITRITE NEGATIVE 06/08/2020 0950   LEUKOCYTESUR SMALL (A) 06/08/2020 0950   Sepsis Labs: '@LABRCNTIP' (procalcitonin:4,lacticidven:4) )No results found for this or any previous visit (from the past 240 hour(s)).   Radiological Exams on Admission: DG Chest 2 View  Result Date: 10/14/2020 CLINICAL DATA:  Fall EXAM: CHEST - 2 VIEW COMPARISON:  09/26/2016 FINDINGS: Mild hyperinflation. Heart and mediastinal contours are within normal limits. No focal opacities or effusions. No acute bony abnormality. IMPRESSION: Mild hyperinflation.  No active cardiopulmonary disease. Electronically Signed   By: Rolm Baptise M.D.   On: 10/14/2020 16:37   DG Thoracic Spine 2 View  Result Date: 10/14/2020 CLINICAL DATA:  Fall, pain EXAM: THORACIC SPINE 2 VIEWS COMPARISON:  None. FINDINGS: Degenerative changes throughout the thoracic spine with disc space narrowing and spurring. Normal alignment. No fracture or focal bone lesion. IMPRESSION: Degenerative changes.  No acute bony abnormality Electronically Signed   By: Rolm Baptise M.D.   On: 10/14/2020 16:36   DG Lumbar Spine Complete  Result Date: 10/14/2020 CLINICAL DATA:  Fall, pain EXAM: LUMBAR SPINE - COMPLETE 4+ VIEW COMPARISON:  None. FINDINGS: Diffuse degenerative disc and facet disease. 4 mm anterolisthesis of L5 on S1 related to degenerative facet disease. No fracture. SI joints symmetric and unremarkable. IMPRESSION: Diffuse degenerative disc and facet disease. No acute bony abnormality. Electronically Signed   By: Rolm Baptise M.D.   On: 10/14/2020 16:36   MR LUMBAR SPINE WO CONTRAST  Result Date: 10/14/2020 CLINICAL DATA:  Chronic low back pain with bilateral radiculopathy EXAM: MRI LUMBAR SPINE WITHOUT CONTRAST TECHNIQUE: Multiplanar, multisequence MR imaging of the lumbar spine was  performed. No intravenous contrast was administered. COMPARISON:  MRI 10/01/2017  FINDINGS: Segmentation:  Standard. Alignment: 5 mm grade 1 anterolisthesis L5 on S1. Trace retrolisthesis of T12 on L1 and L1 on L2. Vertebrae: No fracture, evidence of discitis, or bone lesion. Discogenic endplate marrow changes at L4-5. Conus medullaris and cauda equina: Conus extends to the L2 level. Conus and cauda equina appear normal. Paraspinal and other soft tissues: No acute abnormality. Disc levels: T12-L1: Mild circumferential disc bulge and endplate spurring, most pronounced within the left foraminal zone. No canal stenosis. Mild left foraminal stenosis. Unchanged. L1-L2: Progressive disc height loss with diffuse disc osteophyte complex. Mild bilateral facet arthropathy. Mild canal stenosis with mild bilateral foraminal stenosis. Findings progressed from prior. L2-L3: Mild disc bulge, slightly eccentric to the right. Mild bilateral facet hypertrophy. Mild right foraminal stenosis. No canal stenosis. Unchanged. L3-L4: No disc protrusion. Mild bilateral facet hypertrophy. No foraminal or canal stenosis. Unchanged. L4-L5: Progressive disc height loss with circumferential disc bulge and endplate spurring. Bilateral facet arthropathy and ligamentum flavum buckling. Findings contribute to moderate canal stenosis with severe bilateral foraminal stenosis. Findings progressed from prior. L5-S1: Anterolisthesis with disc uncovering and diffuse disc bulge. Bilateral facet arthropathy. Severe bilateral foraminal stenosis. No canal stenosis. Unchanged. IMPRESSION: 1. Multilevel lumbar spondylosis, as above. Findings have progressed at the L1-2 and L4-5 levels. 2. Moderate canal stenosis and severe bilateral foraminal stenosis at L4-5. 3. Severe bilateral foraminal stenosis at L5-S1. Electronically Signed   By: Davina Poke D.O.   On: 10/14/2020 19:27      Assessment/Plan Principal Problem:   Fall at home, initial  encounter Active Problems:   GERD   Diabetes mellitus type 2, noninsulin dependent (HCC)   Chronic joint pain   Asthma   S/P total knee arthroplasty, left   Weakness     1.  Fall at home: Recurrent, persistent, patient has gait abnormalities.  Patient is basically failing to thrive at home.  She reportedly has left total knee replacement but has symptoms are more on the right.  She will be admitted.  PT and OT evaluation.  May have diabetic neuropathy as well.  Patient will ultimately be discharged possibly to skilled facility based on recommendation of physical therapy.  #2 type II diabetes: Sliding scale insulin.  Continue home regimen  #3 GERD: Continue with PPIs  #4 generalized weakness: Continue physical therapy as above  #5 osteoarthritis: Status post knee replacement.  Continue PT OT consultation.  #6 history of asthma: Continue albuterol.  #7 spinal stenosis: We will need continued follow-up.  No significant pain at the moment.    DVT prophylaxis: Lovenox Code Status: Full code Family Communication: No family at bedside Disposition Plan: To be determined Consults called: None Admission status: Inpatient  Severity of Illness: The appropriate patient status for this patient is INPATIENT. Inpatient status is judged to be reasonable and necessary in order to provide the required intensity of service to ensure the patient's safety. The patient's presenting symptoms, physical exam findings, and initial radiographic and laboratory data in the context of their chronic comorbidities is felt to place them at high risk for further clinical deterioration. Furthermore, it is not anticipated that the patient will be medically stable for discharge from the hospital within 2 midnights of admission. The following factors support the patient status of inpatient.   " The patient's presenting symptoms include recurrent fall. " The worrisome physical exam findings include decreased bowel  right lower extremity. " The initial radiographic and laboratory data are worrisome because of multilevel degenerative disc disease of the  lumbar spine. " The chronic co-morbidities include osteoarthritis.   * I certify that at the point of admission it is my clinical judgment that the patient will require inpatient hospital care spanning beyond 2 midnights from the point of admission due to high intensity of service, high risk for further deterioration and high frequency of surveillance required.Barbette Merino MD [facility] Pager [phone]  If 7PM-7AM, please contact night-coverage [email or web address] Password [email or web address]  10/14/2020, 10:32 PM

## 2020-10-14 NOTE — ED Provider Notes (Signed)
[facility] DEPT Provider Note   CSN: [id number] Arrival date & time: 10/14/20  1501     History Chief Complaint  Patient presents with  . Fall    [name] is a 65 y.o. female.  The history is provided by the patient, the EMS personnel and medical records.  Fall   [name] is a 65 y.o. female who presents to the Emergency Department complaining of fall.  She presents to the ED by EMS for evaluation following a fall at home.  She has frequent falls.  In January she had knee replacement performed.  Late march she started having problems with falls.  Now she cannot stand at all because of pain in her legs (left lateral knee primarily) and they are weak and just buckle beneath her.  She has not been able to walk with her walker for about a week at home.  Has not recently hit her head during a fall (last time she hit her head was in February).    No recent illnesses.  No fever, cough, chest pain, abdominal pain, N/V/D, dysuria.  Lives with daughter and grandkids.    Takes meds as prescribed.  Last night she fell and laid on the floor all night.      Past Medical History:  Diagnosis Date  . Anxiety   . Arthritis   . Asthma   . Depression   . Dysrhythmia   . History of GI bleed    2010--- UPPER ESOPHAGITIS/ DUODERAL ULCER/ EROSION  . History of hiatal hernia   . History of kidney stones   . History of shingles    2013  . Myocardial infarction ([place]) 10 years ago   . Nephrolithiasis    BILATERAL  . Right ureteral stone   . Type 2 diabetes, diet controlled ([place])   . Urgency of urination     Patient Active Problem List   Diagnosis Date Noted  . Weakness 10/14/2020  . Fall at home, initial encounter 10/14/2020  . S/P total knee arthroplasty, 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  . Nephrolithiasis 07/08/2017  . Abnormal EKG 06/18/2017  . Preop cardiovascular exam 06/18/2017  . History of myocardial infarction 06/18/2017  . Chronic joint pain 10/08/2016  .  Asthma 10/08/2016  . History of kidney stones 10/08/2016  . Insomnia 10/08/2016  . Diabetes mellitus type 2, noninsulin dependent ([place]) 09/10/2013  . Obesity (BMI 30-39.9) 09/10/2013  . GERD 03/15/2009    Past Surgical History:  Procedure Laterality Date  . CARDIAC CATHETERIZATION  02-10-2009  DR BENSIMHON   NORMAL CORONARIES/  LOW NORMAL LVF WITHOUT WALL MOTION ABNORMALITIES  . CARDIAC CATHETERIZATION  11-21-2010  DR Einar Gip   NORMAL CORONARIES/  EF 50-55%  . CHOLECYSTECTOMY  1990s   back in [place]   . CYSTOSCOPY W/ URETERAL STENT PLACEMENT Right 09/04/2013   Procedure: CYSTOSCOPY WITH RETROGRADE PYELOGRAM/URETERAL STENT PLACEMENT;  Surgeon: Molli Hazard, MD;  Location: [facility];  Service: Urology;  Laterality: Right;  . CYSTOSCOPY WITH RETROGRADE PYELOGRAM, URETEROSCOPY AND STENT PLACEMENT Right 09/27/2013   Procedure: CYSTOSCOPY WITH RETROGRADE PYELOGRAM, URETEROSCOPY AND STENT EXCHANGE;  Surgeon: Molli Hazard, MD;  Location: [facility];  Service: Urology;  Laterality: Right;  . HOLMIUM LASER APPLICATION Right 01/26/99   Procedure: HOLMIUM LASER APPLICATION;  Surgeon: Molli Hazard, MD;  Location: [facility];  Service: Urology;  Laterality: Right;  . HYSTEROSCOPY WITH D & C  01-25-2003  . IR URETERAL STENT RIGHT NEW  ACCESS W/O SEP NEPHROSTOMY CATH  07/08/2017  . LAPAROSCOPIC ASSISTED VAGINAL HYSTERECTOMY  03-02-2003   W/  BILATERAL SALPINGOOPHORECTOMY  . LAPAROSCOPY  EXTENSIVE LYSIS ADHESIONS/  REDO PARAESOPHAGEAL HIATAL HERNIA WITH PRIMARY CLOSURE AND MESH/ NISSEN FUNDOPLICATION (1cm)/  REPAIR GASTROTOMY  08-22-2009  . NEPHROLITHOTOMY Right 07/08/2017   Procedure: NEPHROLITHOTOMY PERCUTANEOUS;  Surgeon: Festus Aloe, MD;  Location: [facility];  Service: Urology;  Laterality: Right;  . NISSEN FUNDOPLICATION  1610   W/  CHOLECYSTECTOMY  . SHOULDER ARTHROSCOPY WITH SUBACROMIAL DECOMPRESSION, ROTATOR CUFF REPAIR AND BICEP TENDON  REPAIR Left 08-02-2010   AND LABRAL DEBRIDEMENT  . TOTAL KNEE ARTHROPLASTY Right 07-01-2006  . TOTAL KNEE ARTHROPLASTY Left 06/08/2020   Procedure: LEFT TOTAL KNEE ARTHROPLASTY;  Surgeon: Meredith Pel, MD;  Location: [facility];  Service: Orthopedics;  Laterality: Left;  . TOTAL KNEE REVISION  02/25/2012   Procedure: TOTAL KNEE REVISION;  Surgeon: Meredith Pel, MD;  Location: [facility];  Service: Orthopedics;  Laterality: Right;  Revise right total knee arthroplasty  . TUBAL LIGATION  1990  . WRIST FUSION Left 2004   RETAINED HARDWARE     OB History   No obstetric history on file.     Family History  Problem Relation Age of Onset  . Heart disease Father     Social History   Tobacco Use  . Smoking status: Never Smoker  . Smokeless tobacco: Never Used  Vaping Use  . Vaping Use: Never used  Substance Use Topics  . Alcohol use: No  . Drug use: No    Home Medications Prior to Admission medications   Medication Sig Start Date End Date Taking? Authorizing Provider  acetaminophen (TYLENOL) 325 MG tablet Take 1-2 tablets (325-650 mg total) by mouth every 6 (six) hours as needed for mild pain (pain score 1-3 or temp > 100.5). 06/09/20  Yes Dean, Tonna Corner, MD  albuterol (PROVENTIL HFA;VENTOLIN HFA) 108 (90 Base) MCG/ACT inhaler Inhale 2 puffs into the lungs every 4 (four) hours as needed for wheezing or shortness of breath. For shortness of breath 01/28/17  Yes Baity, Coralie Keens, NP  albuterol (PROVENTIL) (2.5 MG/3ML) 0.083% nebulizer solution Take 2.5 mg by nebulization every 6 (six) hours as needed for wheezing or shortness of breath.   Yes [provider]  amitriptyline (ELAVIL) 10 MG tablet Take 10 mg by mouth at bedtime.   Yes [provider]  ARIPiprazole (ABILIFY) 5 MG tablet Take 5 mg by mouth daily.   Yes [provider]  aspirin EC 81 MG tablet Take 1 tablet (81 mg total) by mouth daily. 07/14/17  Yes Filippou, Braxton Feathers, MD  blood glucose meter  kit and supplies Dispense based on patient and insurance preference. Use up to four times daily as directed. (FOR ICD-10 E10.9, E11.9). 09/26/17  Yes Elby Beck, FNP  buprenorphine (BUTRANS) 10 MCG/HR PTWK Place 1 patch onto the skin once a week. Every Saturday 09/01/20  Yes [provider]  etodolac (LODINE XL) 600 MG 24 hr tablet Take 600 mg by mouth daily as needed. 10/08/20  Yes [provider]  fenofibrate 54 MG tablet Take 54 mg by mouth daily. 10/08/20  Yes [provider]  gabapentin (NEURONTIN) 800 MG tablet Take 800 mg by mouth 3 (three) times daily.   Yes [provider]  glipiZIDE (GLUCOTROL) 5 MG tablet Take 2.5 mg by mouth daily before breakfast.   Yes [provider]  insulin detemir (LEVEMIR FLEXTOUCH) 100 UNIT/ML FlexPen Inject  24 Units into the skin at bedtime.   Yes [provider]  JANUMET 50-1000 MG tablet Take 1 tablet by mouth 2 (two) times daily. 07/19/20  Yes [provider]  Magnesium 500 MG TABS Take 500 mg by mouth at bedtime.   Yes [provider]  metaxalone (SKELAXIN) 800 MG tablet Take 800 mg by mouth 4 (four) times daily as needed. 08/01/20  Yes [provider]  Multiple Vitamins-Minerals (ADVANCED DIABETIC MULTIVITAMIN PO) Take 1 tablet by mouth daily.   Yes [provider]  oxyCODONE (OXYCONTIN) 10 mg 12 hr tablet Take 1 tablet (10 mg total) by mouth every 12 (twelve) hours. 06/09/20  Yes Meredith Pel, MD  oxyCODONE-acetaminophen (PERCOCET) 10-325 MG tablet Take 1 tablet by mouth 4 (four) times daily as needed. For breakthrough pain. 07/09/17  Yes Filippou, Braxton Feathers, MD  OZEMPIC, 1 MG/DOSE, 4 MG/3ML SOPN Inject 1 mg into the muscle every Saturday. 03/26/20  Yes [provider]  pravastatin (PRAVACHOL) 80 MG tablet Take 80 mg by mouth daily. 10/08/20  Yes [provider]  sertraline (ZOLOFT) 100 MG tablet Take 100 mg by mouth at bedtime.   Yes [provider]  tiZANidine (ZANAFLEX) 4 MG capsule Take 4 mg by mouth every 8 (eight) hours as needed for muscle spasms. 07/29/16  Yes [provider]  vitamin B-12 (CYANOCOBALAMIN) 1000 MCG tablet Take 1,000 mcg by mouth daily.   Yes [provider]  zolpidem (AMBIEN) 5 MG tablet Take 5 mg by mouth at bedtime as needed for sleep. 08/01/16  Yes [provider]    Allergies    Patient has no known allergies.  Review of Systems   Review of Systems  All other systems reviewed and are negative.   Physical Exam Updated Vital Signs BP (!) 149/76 (BP Location: Right Arm)   Pulse 71   Temp 98.1 F (36.7 C) (Oral)   Resp 16   Ht _0  (1.6 m)   Wt 75 kg   SpO2 100%   BMI 29.29 kg/m   Physical Exam Vitals and nursing note reviewed.  Constitutional:      Appearance: She is well-developed.  HENT:     Head: Normocephalic and atraumatic.  Cardiovascular:     Rate and Rhythm: Normal rate and regular rhythm.     Heart sounds: No murmur heard.   Pulmonary:     Effort: Pulmonary effort is normal. No respiratory distress.     Breath sounds: Normal breath sounds.  Abdominal:     Palpations: Abdomen is soft.     Tenderness: There is no abdominal tenderness. There is no guarding or rebound.  Musculoskeletal:        General: No tenderness.  Skin:    General: Skin is warm and dry.  Neurological:     Mental Status: She is alert and oriented to person, place, and time.     Comments: 5/5 strength in BUE.  3/5 strength in proximal lower extremities bilaterally.  4/5 strength in plantar/dorsi flexion bilaterally.  2+ patellar reflexes bilaterally.  Sensation to light touch intact in all four extremities.   Psychiatric:        Behavior: Behavior normal.     ED Results / Procedures / Treatments   Labs (all labs ordered are listed, but only abnormal results are displayed) Labs Reviewed  COMPREHENSIVE METABOLIC PANEL - Abnormal; Notable for the following components:       Result Value   Glucose, Bld 267 (*)  All other components within normal limits  CBC WITH DIFFERENTIAL/PLATELET - Abnormal; Notable for the following components:   Hemoglobin 11.5 (*)    RDW 15.6 (*)    All other components within normal limits  MAGNESIUM - Abnormal; Notable for the following components:   Magnesium 1.6 (*)    All other components within normal limits  SEDIMENTATION RATE - Abnormal; Notable for the following components:   Sed Rate 63 (*)    All other components within normal limits  C-REACTIVE PROTEIN - Abnormal; Notable for the following components:   CRP 2.0 (*)    All other components within normal limits  TROPONIN I (HIGH SENSITIVITY) - Abnormal; Notable for the following components:   Troponin I (High Sensitivity) 21 (*)    All other components within normal limits  TROPONIN I (HIGH SENSITIVITY) - Abnormal; Notable for the following components:   Troponin I (High Sensitivity) 20 (*)    All other components within normal limits  SARS CORONAVIRUS 2 (TAT 6-24 HRS)  CK  URINALYSIS, ROUTINE W REFLEX MICROSCOPIC    EKG EKG Interpretation  Date/Time:  Saturday Oct 14 2020 16:00:52 EDT Ventricular Rate:  67 PR Interval:  165 QRS Duration: 90 QT Interval:  439 QTC Calculation: 464 R Axis:   31 Text Interpretation: Sinus rhythm Ventricular trigeminy Confirmed by Quintella Reichert [phone]) on 10/14/2020 4:03:52 PM   Radiology DG Chest 2 View  Result Date: 10/14/2020 CLINICAL DATA:  Fall EXAM: CHEST - 2 VIEW COMPARISON:  09/26/2016 FINDINGS: Mild hyperinflation. Heart and mediastinal contours are within normal limits. No focal opacities or effusions. No acute bony abnormality. IMPRESSION: Mild hyperinflation.  No active cardiopulmonary disease. Electronically Signed   By: Rolm Baptise M.D.   On: 10/14/2020 16:37   DG Thoracic Spine 2 View  Result Date: 10/14/2020 CLINICAL DATA:  Fall, pain EXAM: THORACIC SPINE 2 VIEWS COMPARISON:  None. FINDINGS:  Degenerative changes throughout the thoracic spine with disc space narrowing and spurring. Normal alignment. No fracture or focal bone lesion. IMPRESSION: Degenerative changes.  No acute bony abnormality Electronically Signed   By: Rolm Baptise M.D.   On: 10/14/2020 16:36   DG Lumbar Spine Complete  Result Date: 10/14/2020 CLINICAL DATA:  Fall, pain EXAM: LUMBAR SPINE - COMPLETE 4+ VIEW COMPARISON:  None. FINDINGS: Diffuse degenerative disc and facet disease. 4 mm anterolisthesis of L5 on S1 related to degenerative facet disease. No fracture. SI joints symmetric and unremarkable. IMPRESSION: Diffuse degenerative disc and facet disease. No acute bony abnormality. Electronically Signed   By: Rolm Baptise M.D.   On: 10/14/2020 16:36   MR LUMBAR SPINE WO CONTRAST  Result Date: 10/14/2020 CLINICAL DATA:  Chronic low back pain with bilateral radiculopathy EXAM: MRI LUMBAR SPINE WITHOUT CONTRAST TECHNIQUE: Multiplanar, multisequence MR imaging of the lumbar spine was performed. No intravenous contrast was administered. COMPARISON:  MRI 10/01/2017 FINDINGS: Segmentation:  Standard. Alignment: 5 mm grade 1 anterolisthesis L5 on S1. Trace retrolisthesis of T12 on L1 and L1 on L2. Vertebrae: No fracture, evidence of discitis, or bone lesion. Discogenic endplate marrow changes at L4-5. Conus medullaris and cauda equina: Conus extends to the L2 level. Conus and cauda equina appear normal. Paraspinal and other soft tissues: No acute abnormality. Disc levels: T12-L1: Mild circumferential disc bulge and endplate spurring, most pronounced within the left foraminal zone. No canal stenosis. Mild left foraminal stenosis. Unchanged. L1-L2: Progressive disc height loss with diffuse disc osteophyte complex. Mild bilateral facet arthropathy. Mild canal stenosis with mild bilateral foraminal  stenosis. Findings progressed from prior. L2-L3: Mild disc bulge, slightly eccentric to the right. Mild bilateral facet hypertrophy. Mild right  foraminal stenosis. No canal stenosis. Unchanged. L3-L4: No disc protrusion. Mild bilateral facet hypertrophy. No foraminal or canal stenosis. Unchanged. L4-L5: Progressive disc height loss with circumferential disc bulge and endplate spurring. Bilateral facet arthropathy and ligamentum flavum buckling. Findings contribute to moderate canal stenosis with severe bilateral foraminal stenosis. Findings progressed from prior. L5-S1: Anterolisthesis with disc uncovering and diffuse disc bulge. Bilateral facet arthropathy. Severe bilateral foraminal stenosis. No canal stenosis. Unchanged. IMPRESSION: 1. Multilevel lumbar spondylosis, as above. Findings have progressed at the L1-2 and L4-5 levels. 2. Moderate canal stenosis and severe bilateral foraminal stenosis at L4-5. 3. Severe bilateral foraminal stenosis at L5-S1. Electronically Signed   By: Davina Poke D.O.   On: 10/14/2020 19:27    Procedures Procedures   Medications Ordered in ED Medications  magnesium sulfate IVPB 2 g 50 mL (0 g Intravenous Stopped 10/14/20 1936)    ED Course  I have reviewed the triage vital signs and the nursing notes.  Pertinent labs & imaging results that were available during my care of the patient were reviewed by me and considered in my medical decision making (see chart for details).    MDM Rules/Calculators/A&P                         patient here for evaluation of progressive lower extremity weakness with multiple falls from home. She does have weakness to bilateral lower extremities with intact reflexes. She also has soft blood pressures to the emergency department, no evidence of acute infectious process. MRI obtained given her weakness, demonstrates moderate to severe stenosis. Discussed with on-call neurosurgeon - does not feel current symptoms can be explained by MRI, patient does not require transfer for neurosurgery evaluation at this time. Hospitalist consulted for admission for ongoing treatment and  workup    Final Clinical Impression(s) / ED Diagnoses Final diagnoses:  None    Rx / DC Orders ED Discharge Orders    None       Quintella Reichert, MD 10/15/20 0003

## 2020-10-15 ENCOUNTER — Encounter (HOSPITAL_COMMUNITY): Payer: Self-pay | Admitting: Internal Medicine

## 2020-10-15 ENCOUNTER — Other Ambulatory Visit: Payer: Self-pay

## 2020-10-15 DIAGNOSIS — E119 Type 2 diabetes mellitus without complications: Secondary | ICD-10-CM | POA: Diagnosis not present

## 2020-10-15 DIAGNOSIS — W19XXXA Unspecified fall, initial encounter: Secondary | ICD-10-CM | POA: Diagnosis not present

## 2020-10-15 DIAGNOSIS — Y92009 Unspecified place in unspecified non-institutional (private) residence as the place of occurrence of the external cause: Secondary | ICD-10-CM | POA: Diagnosis not present

## 2020-10-15 LAB — COMPREHENSIVE METABOLIC PANEL
ALT: 20 U/L (ref 0–44)
AST: 19 U/L (ref 15–41)
Albumin: 3.4 g/dL — ABNORMAL LOW (ref 3.5–5.0)
Alkaline Phosphatase: 106 U/L (ref 38–126)
Anion gap: 8 (ref 5–15)
BUN: 12 mg/dL (ref 8–23)
CO2: 30 mmol/L (ref 22–32)
Calcium: 9.3 mg/dL (ref 8.9–10.3)
Chloride: 103 mmol/L (ref 98–111)
Creatinine, Ser: 0.52 mg/dL (ref 0.44–1.00)
GFR, Estimated: >60 mL/min (ref >60)
Glucose, Bld: 187 mg/dL — ABNORMAL HIGH (ref 70–99)
Potassium: 3.2 mmol/L — ABNORMAL LOW (ref 3.5–5.1)
Sodium: 141 mmol/L (ref 135–145)
Total Bilirubin: 0.5 mg/dL (ref 0.3–1.2)
Total Protein: 6.5 g/dL (ref 6.5–8.1)

## 2020-10-15 LAB — CBC
HCT: 36.6 % (ref 36.0–46.0)
Hemoglobin: 11.1 g/dL — ABNORMAL LOW (ref 12.0–15.0)
MCH: 26.3 pg (ref 26.0–34.0)
MCHC: 30.3 g/dL (ref 30.0–36.0)
MCV: 86.7 fL (ref 80.0–100.0)
Platelets: 209 10*3/uL (ref 150–400)
RBC: 4.22 MIL/uL (ref 3.87–5.11)
RDW: 15.6 % — ABNORMAL HIGH (ref 11.5–15.5)
WBC: 6.8 10*3/uL (ref 4.0–10.5)
nRBC: 0 % (ref 0.0–0.2)

## 2020-10-15 LAB — MAGNESIUM: Magnesium: 1.7 mg/dL (ref 1.7–2.4)

## 2020-10-15 LAB — GLUCOSE, CAPILLARY
Glucose-Capillary: 145 mg/dL — ABNORMAL HIGH (ref 70–99)
Glucose-Capillary: 147 mg/dL — ABNORMAL HIGH (ref 70–99)
Glucose-Capillary: 164 mg/dL — ABNORMAL HIGH (ref 70–99)
Glucose-Capillary: 205 mg/dL — ABNORMAL HIGH (ref 70–99)
Glucose-Capillary: 212 mg/dL — ABNORMAL HIGH (ref 70–99)

## 2020-10-15 LAB — HEMOGLOBIN A1C
Hgb A1c MFr Bld: 8.2 % — ABNORMAL HIGH (ref 4.8–5.6)
Mean Plasma Glucose: 188.64 mg/dL

## 2020-10-15 LAB — HIV ANTIBODY (ROUTINE TESTING W REFLEX): HIV Screen 4th Generation wRfx: NONREACTIVE

## 2020-10-15 LAB — SARS CORONAVIRUS 2 (TAT 6-24 HRS): SARS Coronavirus 2: NEGATIVE

## 2020-10-15 MED ORDER — AMITRIPTYLINE HCL 10 MG PO TABS
10.0000 mg | ORAL_TABLET | Freq: Every day | ORAL | Status: DC
  Administered 2020-10-15 – 2020-10-25 (×12): 10 mg via ORAL
  Filled 2020-10-15 (×14): qty 1

## 2020-10-15 MED ORDER — OXYCODONE HCL 5 MG PO TABS
5.0000 mg | ORAL_TABLET | Freq: Four times a day (QID) | ORAL | Status: DC | PRN
  Administered 2020-10-15 – 2020-10-19 (×8): 5 mg via ORAL
  Filled 2020-10-15 (×8): qty 1

## 2020-10-15 MED ORDER — GLIPIZIDE 5 MG PO TABS
2.5000 mg | ORAL_TABLET | Freq: Every day | ORAL | Status: DC
  Administered 2020-10-15 – 2020-10-18 (×4): 2.5 mg via ORAL
  Filled 2020-10-15 (×5): qty 1

## 2020-10-15 MED ORDER — ALBUTEROL SULFATE HFA 108 (90 BASE) MCG/ACT IN AERS
2.0000 | INHALATION_SPRAY | RESPIRATORY_TRACT | Status: DC | PRN
  Administered 2020-10-16: 2 via RESPIRATORY_TRACT
  Filled 2020-10-15: qty 6.7

## 2020-10-15 MED ORDER — ENOXAPARIN SODIUM 40 MG/0.4ML IJ SOSY
40.0000 mg | PREFILLED_SYRINGE | INTRAMUSCULAR | Status: DC
  Administered 2020-10-15 – 2020-10-19 (×5): 40 mg via SUBCUTANEOUS
  Filled 2020-10-15 (×5): qty 0.4

## 2020-10-15 MED ORDER — INSULIN ASPART 100 UNIT/ML IJ SOLN
0.0000 [IU] | Freq: Every day | INTRAMUSCULAR | Status: DC
  Administered 2020-10-15 – 2020-10-18 (×4): 2 [IU] via SUBCUTANEOUS
  Administered 2020-10-20: 4 [IU] via SUBCUTANEOUS
  Administered 2020-10-21 – 2020-10-22 (×2): 2 [IU] via SUBCUTANEOUS
  Administered 2020-10-24: 3 [IU] via SUBCUTANEOUS

## 2020-10-15 MED ORDER — VITAMIN B-12 1000 MCG PO TABS
1000.0000 ug | ORAL_TABLET | Freq: Every day | ORAL | Status: DC
  Administered 2020-10-15 – 2020-10-26 (×12): 1000 ug via ORAL
  Filled 2020-10-15 (×12): qty 1

## 2020-10-15 MED ORDER — ARIPIPRAZOLE 5 MG PO TABS
5.0000 mg | ORAL_TABLET | Freq: Every day | ORAL | Status: DC
  Administered 2020-10-15 – 2020-10-26 (×12): 5 mg via ORAL
  Filled 2020-10-15 (×12): qty 1

## 2020-10-15 MED ORDER — ACETAMINOPHEN 325 MG PO TABS
325.0000 mg | ORAL_TABLET | Freq: Four times a day (QID) | ORAL | Status: DC | PRN
Start: 2020-10-15 — End: 2020-10-21
  Administered 2020-10-15 – 2020-10-16 (×2): 650 mg via ORAL
  Filled 2020-10-15 (×2): qty 2

## 2020-10-15 MED ORDER — ASPIRIN EC 81 MG PO TBEC
81.0000 mg | DELAYED_RELEASE_TABLET | Freq: Every day | ORAL | Status: DC
  Administered 2020-10-15 – 2020-10-19 (×5): 81 mg via ORAL
  Filled 2020-10-15 (×5): qty 1

## 2020-10-15 MED ORDER — OXYCODONE-ACETAMINOPHEN 5-325 MG PO TABS
1.0000 | ORAL_TABLET | Freq: Four times a day (QID) | ORAL | Status: DC | PRN
  Administered 2020-10-15 – 2020-10-20 (×10): 1 via ORAL
  Filled 2020-10-15 (×11): qty 1

## 2020-10-15 MED ORDER — MAGNESIUM 500 MG PO TABS: 500.0000 mg | ORAL_TABLET | Freq: Every day | ORAL | Status: DC

## 2020-10-15 MED ORDER — ONDANSETRON HCL 4 MG/2ML IJ SOLN: 4.0000 mg | Freq: Four times a day (QID) | INTRAMUSCULAR | Status: DC | PRN

## 2020-10-15 MED ORDER — ALBUTEROL SULFATE (2.5 MG/3ML) 0.083% IN NEBU: 2.5000 mg | INHALATION_SOLUTION | Freq: Four times a day (QID) | RESPIRATORY_TRACT | Status: DC | PRN

## 2020-10-15 MED ORDER — PRAVASTATIN SODIUM 40 MG PO TABS
80.0000 mg | ORAL_TABLET | Freq: Every day | ORAL | Status: DC
  Administered 2020-10-15 – 2020-10-26 (×12): 80 mg via ORAL
  Filled 2020-10-15 (×3): qty 2
  Filled 2020-10-15 (×2): qty 4
  Filled 2020-10-15 (×2): qty 2
  Filled 2020-10-15: qty 4
  Filled 2020-10-15 (×3): qty 2
  Filled 2020-10-15: qty 4

## 2020-10-15 MED ORDER — INSULIN ASPART 100 UNIT/ML IJ SOLN
0.0000 [IU] | Freq: Three times a day (TID) | INTRAMUSCULAR | Status: DC
  Administered 2020-10-15: 2 [IU] via SUBCUTANEOUS
  Administered 2020-10-15: 5 [IU] via SUBCUTANEOUS
  Administered 2020-10-15: 2 [IU] via SUBCUTANEOUS
  Administered 2020-10-16: 3 [IU] via SUBCUTANEOUS
  Administered 2020-10-16 (×2): 2 [IU] via SUBCUTANEOUS
  Administered 2020-10-17: 5 [IU] via SUBCUTANEOUS
  Administered 2020-10-17: 8 [IU] via SUBCUTANEOUS
  Administered 2020-10-17: 2 [IU] via SUBCUTANEOUS
  Administered 2020-10-18 – 2020-10-19 (×4): 3 [IU] via SUBCUTANEOUS
  Administered 2020-10-19: 5 [IU] via SUBCUTANEOUS
  Administered 2020-10-19: 3 [IU] via SUBCUTANEOUS
  Administered 2020-10-20: 11 [IU] via SUBCUTANEOUS
  Administered 2020-10-21 (×2): 8 [IU] via SUBCUTANEOUS
  Administered 2020-10-21: 5 [IU] via SUBCUTANEOUS
  Administered 2020-10-22: 8 [IU] via SUBCUTANEOUS
  Administered 2020-10-22: 5 [IU] via SUBCUTANEOUS
  Administered 2020-10-22: 3 [IU] via SUBCUTANEOUS
  Administered 2020-10-23: 5 [IU] via SUBCUTANEOUS
  Administered 2020-10-23: 2 [IU] via SUBCUTANEOUS
  Administered 2020-10-23: 3 [IU] via SUBCUTANEOUS
  Administered 2020-10-24: 11 [IU] via SUBCUTANEOUS
  Administered 2020-10-24: 2 [IU] via SUBCUTANEOUS
  Administered 2020-10-24: 8 [IU] via SUBCUTANEOUS
  Administered 2020-10-25 (×2): 3 [IU] via SUBCUTANEOUS
  Administered 2020-10-25 – 2020-10-26 (×2): 5 [IU] via SUBCUTANEOUS
  Administered 2020-10-26: 2 [IU] via SUBCUTANEOUS

## 2020-10-15 MED ORDER — ETODOLAC 300 MG PO CAPS
600.0000 mg | ORAL_CAPSULE | Freq: Every day | ORAL | Status: DC | PRN
  Filled 2020-10-15: qty 2

## 2020-10-15 MED ORDER — INSULIN DETEMIR 100 UNIT/ML ~~LOC~~ SOLN
24.0000 [IU] | Freq: Every day | SUBCUTANEOUS | Status: DC
  Administered 2020-10-15 – 2020-10-25 (×12): 24 [IU] via SUBCUTANEOUS
  Filled 2020-10-15 (×15): qty 0.24

## 2020-10-15 MED ORDER — MAGNESIUM GLUCONATE 500 MG PO TABS
500.0000 mg | ORAL_TABLET | Freq: Every day | ORAL | Status: DC
  Administered 2020-10-15 – 2020-10-25 (×11): 500 mg via ORAL
  Filled 2020-10-15 (×14): qty 1

## 2020-10-15 MED ORDER — SODIUM CHLORIDE 0.45 % IV SOLN: INTRAVENOUS | Status: DC

## 2020-10-15 MED ORDER — TIZANIDINE HCL 4 MG PO TABS
4.0000 mg | ORAL_TABLET | Freq: Three times a day (TID) | ORAL | Status: DC | PRN
  Administered 2020-10-15 – 2020-10-20 (×9): 4 mg via ORAL
  Filled 2020-10-15 (×9): qty 1

## 2020-10-15 MED ORDER — OXYCODONE HCL ER 10 MG PO T12A
10.0000 mg | EXTENDED_RELEASE_TABLET | Freq: Two times a day (BID) | ORAL | Status: DC
  Administered 2020-10-15 – 2020-10-26 (×23): 10 mg via ORAL
  Filled 2020-10-15 (×24): qty 1

## 2020-10-15 MED ORDER — MAGNESIUM SULFATE 2 GM/50ML IV SOLN
2.0000 g | Freq: Once | INTRAVENOUS | Status: AC
  Administered 2020-10-15: 2 g via INTRAVENOUS
  Filled 2020-10-15: qty 50

## 2020-10-15 MED ORDER — SERTRALINE HCL 100 MG PO TABS
100.0000 mg | ORAL_TABLET | Freq: Every day | ORAL | Status: DC
  Administered 2020-10-15 – 2020-10-25 (×12): 100 mg via ORAL
  Filled 2020-10-15 (×12): qty 1

## 2020-10-15 MED ORDER — OXYCODONE-ACETAMINOPHEN 10-325 MG PO TABS: 1.0000 | ORAL_TABLET | Freq: Four times a day (QID) | ORAL | Status: DC | PRN

## 2020-10-15 MED ORDER — ZOLPIDEM TARTRATE 5 MG PO TABS: 5.0000 mg | ORAL_TABLET | Freq: Every evening | ORAL | Status: DC | PRN

## 2020-10-15 MED ORDER — ONDANSETRON HCL 4 MG PO TABS: 4.0000 mg | ORAL_TABLET | Freq: Four times a day (QID) | ORAL | Status: DC | PRN

## 2020-10-15 MED ORDER — FENOFIBRATE 54 MG PO TABS
54.0000 mg | ORAL_TABLET | Freq: Every day | ORAL | Status: DC
  Administered 2020-10-15 – 2020-10-26 (×12): 54 mg via ORAL
  Filled 2020-10-15 (×12): qty 1

## 2020-10-15 MED ORDER — GABAPENTIN 400 MG PO CAPS
800.0000 mg | ORAL_CAPSULE | Freq: Three times a day (TID) | ORAL | Status: DC
  Administered 2020-10-15 – 2020-10-26 (×34): 800 mg via ORAL
  Filled 2020-10-15 (×34): qty 2

## 2020-10-15 MED ORDER — POTASSIUM CHLORIDE IN NACL 40-0.9 MEQ/L-% IV SOLN
INTRAVENOUS | Status: DC
  Filled 2020-10-15 (×5): qty 1000

## 2020-10-15 MED ORDER — METAXALONE 800 MG PO TABS
800.0000 mg | ORAL_TABLET | Freq: Four times a day (QID) | ORAL | Status: DC | PRN
  Administered 2020-10-16 – 2020-10-17 (×3): 800 mg via ORAL
  Filled 2020-10-15 (×5): qty 1

## 2020-10-15 NOTE — Plan of Care (Signed)

## 2020-10-15 NOTE — Plan of Care (Signed)
POC initiated 

## 2020-10-15 NOTE — Progress Notes (Signed)
Report received from Junior RN/ED. Pt arrived to 1333 via stretcher and assisted to bed, aligned for comfort. education initiated on use of call bell for needs/safety. Handbook on  pt's overbed table

## 2020-10-15 NOTE — Progress Notes (Signed)
PROGRESS NOTE    [name]  WUJ:[id number] DOB: Jan 10, 1956 DOA: 10/14/2020 PCP: Courtney Paris, NP   Brief Narrative: 65 year old female with history of bilateral knee replacement, left knee replaced January 2022, anxiety disorder, CAD, diabetes, depression, asthma, kidney stones with history of ureteral stents admitted with multiple falls at home.  She thinks the last time she walked normal was in April.  She reports her legs/knees gave way when she tries to walk.  She does not have any complaints of knee pain.  At baseline she uses a walker or a cane. MRI of the lumbar spine with spinal stenosis.  ED physician discussed with neurosurgery who did not think her symptoms are related to spinal stenosis. she lives at home with her daughter. Her blood pressure was 76/62 on arrival to the ED.  Pulse was 93.  Assessment & Plan:   Principal Problem:   Fall at home, initial encounter Active Problems:   GERD   Diabetes mellitus type 2, noninsulin dependent (HCC)   Chronic joint pain   Asthma   S/P total knee arthroplasty, left   Weakness    #1 multiple falls at home likely multifactorial.     She is noted to have hypotension causing dizziness and fall, polypharmacy, neuropathy, she is status post bilateral knee replacement the recent 1 being the left knee in 2022. 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 Saturdays Janumet 50 over 1000  mg 1 tablet twice a day Levemir 25 units at bedtime Neurontin 800 mg 3 times a day  #3 history of osteoarthritis status post bilateral knee replaced  #4 history of asthma stable  # 5 GERD stable  #6 hyperlipidemia on Pravachol  #7 hypokalemia and hypomagnesemia-replete and recheck   Estimated body mass index is 29.29 kg/m as calculated from the following:   Height as of this encounter: 5\' 3"  (1.6 m).   Weight as of this encounter: 75 kg.  DVT prophylaxis: Lovenox  code Status: Full code Family Communication: None at bedside  disposition Plan:  Status is: Inpatient  Dispo: The patient is from: Home              Anticipated d/c is to: Likely SNF              Patient currently is not medically stable to d/c.   Difficult to place patient No    Consultants: none  Procedures:none Antimicrobials: none  Subjective: No knee pain  Feels very weak  C/o dizzy   Objective: Vitals:   10/14/20 2354 10/15/20 0606 10/15/20 0819 10/15/20 1127  BP: (!) 149/76 112/62 (!) 117/53 (!) 83/49  Pulse: 71 72 77 62  Resp: 16 14 14 14   Temp: 98.1 F (36.7 C) 97.7 F (36.5 C) 97.7 F (36.5 C) 97.6 F (36.4 C)  TempSrc: Oral Oral Oral Oral  SpO2: 100% 99% 96% 98%  Weight:  Height:        Intake/Output Summary (Last 24 hours) at 10/15/2020 1327 Last data filed at 10/15/2020 1316 Gross per 24 hour  Intake 1825.87 ml  Output 1350 ml  Net 475.87 ml   Filed Weights   10/14/20 1513  Weight: 75 kg    Examination:  General exam: Appears calm and comfortable  Respiratory system: Clear to auscultation. Respiratory effort normal. Cardiovascular system: S1 & S2 heard, RRR. No JVD, murmurs, rubs, gallops or clicks. No pedal edema. Gastrointestinal system: Abdomen is nondistended, soft and nontender. No organomegaly or masses felt. Normal bowel sounds heard. Central nervous system: Alert and oriented. No focal neurological deficits. Extremities:trace edema scars noted on both  knees Skin: No rashes, lesions or ulcers Psychiatry: Judgement and insight appear normal. Mood & affect appropriate.     Data Reviewed: I have personally reviewed following labs and imaging studies  CBC: Recent Labs  Lab 10/14/20 1553 10/15/20 0319  WBC 7.1 6.8  NEUTROABS 5.0  --   HGB 11.5* 11.1*  HCT 37.2 36.6  MCV 86.1 86.7  PLT 219 209   Basic Metabolic Panel: Recent Labs  Lab 10/14/20 1553 10/15/20 0319  NA 144 141  K 3.7 3.2*  CL 106 103  CO2 32 30  GLUCOSE 267* 187*  BUN 17 12  CREATININE 0.65 0.52  CALCIUM 9.7 9.3  MG 1.6*  --    GFR: Estimated Creatinine Clearance: 70.7 mL/min (by C-G formula based on SCr of 0.52 mg/dL). Liver Function Tests: Recent Labs  Lab 10/14/20 1553 10/15/20 0319  AST 20 19  ALT 19 20  ALKPHOS 121 106  BILITOT 0.3 0.5  PROT 7.2 6.5  ALBUMIN 3.7 3.4*   No results for input(s): LIPASE, AMYLASE in the last 168 hours. No results for input(s): AMMONIA in the last 168 hours. Coagulation Profile: No results for input(s): INR, PROTIME in the last 168 hours. Cardiac Enzymes: Recent Labs  Lab 10/14/20 1553  CKTOTAL 104   BNP (last 3 results) No results for input(s): PROBNP in the last 8760 hours. HbA1C: Recent Labs    10/15/20 0319  HGBA1C 8.2*   CBG: Recent Labs  Lab 10/15/20 0049 10/15/20 0811 10/15/20 1130  GLUCAP 164* 145* 205*   Lipid Profile: No results for input(s): CHOL, HDL, LDLCALC, TRIG, CHOLHDL, LDLDIRECT in the last 72 hours. Thyroid Function Tests: No results for input(s): TSH, T4TOTAL, FREET4, T3FREE, THYROIDAB in the last 72 hours. Anemia Panel: No results for input(s): VITAMINB12, FOLATE, FERRITIN, TIBC, IRON, RETICCTPCT in the last 72 hours. Sepsis Labs: No results for input(s): PROCALCITON, LATICACIDVEN in the last 168 hours.  Recent Results (from the past 240 hour(s))  SARS CORONAVIRUS 2 (TAT 6-24 HRS) Nasopharyngeal Nasopharyngeal Swab     Status: None   Collection Time: 10/14/20  9:35 PM    Specimen: Nasopharyngeal Swab  Result Value Ref Range Status   SARS Coronavirus 2 NEGATIVE NEGATIVE Final    Comment: (NOTE) SARS-CoV-2 target nucleic acids are NOT DETECTED.  The SARS-CoV-2 RNA is generally detectable in upper and lower respiratory specimens during the acute phase of infection. Negative results do not preclude SARS-CoV-2 infection, do not rule out co-infections with other pathogens, and should not be used as the sole basis for treatment or other patient management decisions. Negative results must be combined with clinical observations, patient history, and epidemiological information. The expected result is Negative.  Fact Sheet for Patients: HairSlick.no  Fact Sheet for Healthcare Providers: quierodirigir.com  This test is not  yet approved or cleared by the [place] and  has been authorized for detection and/or diagnosis of SARS-CoV-2 by FDA under an Emergency Use Authorization (EUA). This EUA will remain  in effect (meaning this test can be used) for the duration of the COVID-19 declaration under Se ction 564(b)(1) of the Act, 21 U.S.C. section 360bbb-3(b)(1), unless the authorization is terminated or revoked sooner.  Performed at [facility], [address] [place], [place] [address]          Radiology Studies: DG Chest 2 View  Result Date: 10/14/2020 CLINICAL DATA:  Fall EXAM: CHEST - 2 VIEW COMPARISON:  09/26/2016 FINDINGS: Mild hyperinflation. Heart and mediastinal contours are within normal limits. No focal opacities or effusions. No acute bony abnormality. IMPRESSION: Mild hyperinflation.  No active cardiopulmonary disease. Electronically Signed   By: Charlett Nose M.D.   On: 10/14/2020 16:37   DG Thoracic Spine 2 View  Result Date: 10/14/2020 CLINICAL DATA:  Fall, pain EXAM: THORACIC SPINE 2 VIEWS COMPARISON:  None. FINDINGS: Degenerative changes throughout the thoracic  spine with disc space narrowing and spurring. Normal alignment. No fracture or focal bone lesion. IMPRESSION: Degenerative changes.  No acute bony abnormality Electronically Signed   By: Charlett Nose M.D.   On: 10/14/2020 16:36   DG Lumbar Spine Complete  Result Date: 10/14/2020 CLINICAL DATA:  Fall, pain EXAM: LUMBAR SPINE - COMPLETE 4+ VIEW COMPARISON:  None. FINDINGS: Diffuse degenerative disc and facet disease. 4 mm anterolisthesis of L5 on S1 related to degenerative facet disease. No fracture. SI joints symmetric and unremarkable. IMPRESSION: Diffuse degenerative disc and facet disease. No acute bony abnormality. Electronically Signed   By: Charlett Nose M.D.   On: 10/14/2020 16:36   MR LUMBAR SPINE WO CONTRAST  Result Date: 10/14/2020 CLINICAL DATA:  Chronic low back pain with bilateral radiculopathy EXAM: MRI LUMBAR SPINE WITHOUT CONTRAST TECHNIQUE: Multiplanar, multisequence MR imaging of the lumbar spine was performed. No intravenous contrast was administered. COMPARISON:  MRI 10/01/2017 FINDINGS: Segmentation:  Standard. Alignment: 5 mm grade 1 anterolisthesis L5 on S1. Trace retrolisthesis of T12 on L1 and L1 on L2. Vertebrae: No fracture, evidence of discitis, or bone lesion. Discogenic endplate marrow changes at L4-5. Conus medullaris and cauda equina: Conus extends to the L2 level. Conus and cauda equina appear normal. Paraspinal and other soft tissues: No acute abnormality. Disc levels: T12-L1: Mild circumferential disc bulge and endplate spurring, most pronounced within the left foraminal zone. No canal stenosis. Mild left foraminal stenosis. Unchanged. L1-L2: Progressive disc height loss with diffuse disc osteophyte complex. Mild bilateral facet arthropathy. Mild canal stenosis with mild bilateral foraminal stenosis. Findings progressed from prior. L2-L3: Mild disc bulge, slightly eccentric to the right. Mild bilateral facet hypertrophy. Mild right foraminal stenosis. No canal stenosis.  Unchanged. L3-L4: No disc protrusion. Mild bilateral facet hypertrophy. No foraminal or canal stenosis. Unchanged. L4-L5: Progressive disc height loss with circumferential disc bulge and endplate spurring. Bilateral facet arthropathy and ligamentum flavum buckling. Findings contribute to moderate canal stenosis with severe bilateral foraminal stenosis. Findings progressed from prior. L5-S1: Anterolisthesis with disc uncovering and diffuse disc bulge. Bilateral facet arthropathy. Severe bilateral foraminal stenosis. No canal stenosis. Unchanged. IMPRESSION: 1. Multilevel lumbar spondylosis, as above. Findings have progressed at the L1-2 and L4-5 levels. 2. Moderate canal stenosis and severe bilateral foraminal stenosis at L4-5. 3. Severe bilateral foraminal stenosis at L5-S1. Electronically Signed   By: Duanne Guess D.O.   On: 10/14/2020 19:27  Scheduled Meds: . amitriptyline  10 mg Oral QHS  . ARIPiprazole  5 mg Oral Daily  . aspirin EC  81 mg Oral Daily  . enoxaparin (LOVENOX) injection  40 mg Subcutaneous Q24H  . fenofibrate  54 mg Oral Daily  . gabapentin  800 mg Oral TID  . glipiZIDE  2.5 mg Oral QAC breakfast  . insulin aspart  0-15 Units Subcutaneous TID WC  . insulin aspart  0-5 Units Subcutaneous QHS  . insulin detemir  24 Units Subcutaneous QHS  . magnesium gluconate  500 mg Oral QHS  . oxyCODONE  10 mg Oral Q12H  . pravastatin  80 mg Oral Daily  . sertraline  100 mg Oral QHS  . vitamin B-12  1,000 mcg Oral Daily   Continuous Infusions: . sodium chloride 75 mL/hr at 10/15/20 1316     LOS: 1 day   Alwyn Ren, MD  10/15/2020, 1:27 PM

## 2020-10-16 ENCOUNTER — Inpatient Hospital Stay (HOSPITAL_COMMUNITY): Payer: Medicare Other

## 2020-10-16 DIAGNOSIS — E119 Type 2 diabetes mellitus without complications: Secondary | ICD-10-CM | POA: Diagnosis not present

## 2020-10-16 DIAGNOSIS — W19XXXA Unspecified fall, initial encounter: Secondary | ICD-10-CM | POA: Diagnosis not present

## 2020-10-16 DIAGNOSIS — Y92009 Unspecified place in unspecified non-institutional (private) residence as the place of occurrence of the external cause: Secondary | ICD-10-CM | POA: Diagnosis not present

## 2020-10-16 LAB — BASIC METABOLIC PANEL
Anion gap: 5 (ref 5–15)
BUN: 17 mg/dL (ref 8–23)
CO2: 30 mmol/L (ref 22–32)
Calcium: 9.3 mg/dL (ref 8.9–10.3)
Chloride: 106 mmol/L (ref 98–111)
Creatinine, Ser: 0.79 mg/dL (ref 0.44–1.00)
GFR, Estimated: >60 mL/min (ref >60)
Glucose, Bld: 145 mg/dL — ABNORMAL HIGH (ref 70–99)
Potassium: 4.7 mmol/L (ref 3.5–5.1)
Sodium: 141 mmol/L (ref 135–145)

## 2020-10-16 LAB — CK: Total CK: 37 U/L — ABNORMAL LOW (ref 38–234)

## 2020-10-16 LAB — TSH: TSH: 2.333 u[IU]/mL (ref 0.350–4.500)

## 2020-10-16 LAB — GLUCOSE, CAPILLARY
Glucose-Capillary: 124 mg/dL — ABNORMAL HIGH (ref 70–99)
Glucose-Capillary: 178 mg/dL — ABNORMAL HIGH (ref 70–99)
Glucose-Capillary: 134 mg/dL — ABNORMAL HIGH (ref 70–99)
Glucose-Capillary: 231 mg/dL — ABNORMAL HIGH (ref 70–99)

## 2020-10-16 LAB — MAGNESIUM: Magnesium: 1.8 mg/dL (ref 1.7–2.4)

## 2020-10-16 MED ORDER — GADOBUTROL 1 MMOL/ML IV SOLN
7.0000 mL | Freq: Once | INTRAVENOUS | Status: AC | PRN
  Administered 2020-10-16: 7 mL via INTRAVENOUS

## 2020-10-16 NOTE — Progress Notes (Signed)
I will asked my partner Dr. Ophelia Charter to evaluate the patient.

## 2020-10-16 NOTE — Progress Notes (Signed)
PROGRESS NOTE    Jasmine ForestLinda S Anderson  WUJ:[id number] DOB: 07/08/1955 DOA: 10/14/2020 PCP: Courtney ParisMcCoy, Rachel, NP   Brief Narrative: 65 year old female with history of bilateral knee replacement, left knee replaced January 2022, anxiety disorder, CAD, diabetes, depression, asthma, kidney stones with history of ureteral stents admitted with multiple falls at home.  She thinks the last time she walked normal was in April.  She reports her legs/knees gave way when she tries to walk.  She does not have any complaints of knee pain.  At baseline she uses a walker or a cane. MRI of the lumbar spine with spinal stenosis.  ED physician discussed with neurosurgery who did not think her symptoms are related to spinal stenosis. she lives at home with her daughter. Her blood pressure was 76/62 on arrival to the ED.  Pulse was 93.  Assessment & Plan:   Principal Problem:   Fall at home, initial encounter Active Problems:   GERD   Diabetes mellitus type 2, noninsulin dependent (HCC)   Chronic joint pain   Asthma   S/P total knee arthroplasty, left   Weakness    #1 multiple falls at home likely multifactorial.     She is noted to have hypotension causing dizziness and fall, polypharmacy, neuropathy, she is status post bilateral knee replacement the recent 1 being the left knee in 2022. 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 Saturdays Janumet 50 over 1000  mg 1 tablet twice a day Levemir 25 units at bedtime Neurontin 800 mg 3 times a day  #3 history of osteoarthritis status post bilateral knee replaced  #4 history of asthma stable  # 5 GERD stable  #6 hyperlipidemia on Pravachol  #7 hypokalemia and hypomagnesemia-replete and recheck  #8 lumbar spinal stenosis-MRI Multilevel lumbar spondylosis, as above. Findings have progressed at the L1-2 and L4-5 levels.  Moderate canal stenosis and severe bilateral foraminal stenosis at L4-5. Severe bilateral foraminal stenosis at L5-S1.  Estimated body mass index is 29.29 kg/m as calculated from the following:   Height as of this encounter: 5\' 3"  (1.6 m).   Weight as of this encounter: 75 kg.  DVT prophylaxis: Lovenox  code Status: Full code Family Communication: None at bedside  disposition Plan:  Status is: Inpatient  Dispo: The patient is from: Home              Anticipated d/c is to: Likely SNF              Patient currently is not medically stable to d/c.   Difficult to place patient No    Consultants: none  Procedures:none Antimicrobials: none  Subjective: No knee pain  Feels very weak  C/o dizzy   Objective: Vitals:   10/15/20 2136 10/16/20 0642 10/16/20 0743 10/16/20 1308  BP: 107/60 (!) 134/55  (!) 121/53  Pulse: 73 70  74  Resp: 16 16  18  Temp: 97.8 F (36.6 C) 97.8 F (36.6 C)    TempSrc: Oral Oral    SpO2: 94% 97% 94% 96%  Weight:      Height:        Intake/Output Summary (Last 24 hours) at 10/16/2020 1433 Last data filed at 10/16/2020 0900 Gross per 24 hour  Intake 549.78 ml  Output 1250 ml  Net -700.22 ml   Filed Weights   10/14/20 1513  Weight: 75 kg    Examination:  General exam: Appears calm and comfortable  Respiratory system: Clear to auscultation. Respiratory effort normal. Cardiovascular system: S1 & S2 heard, RRR. No JVD, murmurs, rubs, gallops or clicks. No pedal edema. Gastrointestinal system: Abdomen is nondistended, soft and  nontender. No organomegaly or masses felt. Normal bowel sounds heard. Central nervous system: Alert and oriented. No focal neurological deficits. Extremities:trace edema scars noted on both knees Skin: No rashes, lesions or ulcers Psychiatry: Judgement and insight appear normal. Mood & affect appropriate.     Data Reviewed: I have personally reviewed following labs and imaging studies  CBC: Recent Labs  Lab 10/14/20 1553 10/15/20 0319  WBC 7.1 6.8  NEUTROABS 5.0  --   HGB 11.5* 11.1*  HCT 37.2 36.6  MCV 86.1 86.7  PLT 219 209   Basic Metabolic Panel: Recent Labs  Lab 10/14/20 1553 10/15/20 0319 10/15/20 1401 10/16/20 0241  NA 144 141  --  141  K 3.7 3.2*  --  4.7  CL 106 103  --  106  CO2 32 30  --  30  GLUCOSE 267* 187*  --  145*  BUN 17 12  --  17  CREATININE 0.65 0.52  --  0.79  CALCIUM 9.7 9.3  --  9.3  MG 1.6*  --  1.7 1.8   GFR: Estimated Creatinine Clearance: 70.7 mL/min (by C-G formula based on SCr of 0.79 mg/dL). Liver Function Tests: Recent Labs  Lab 10/14/20 1553 10/15/20 0319  AST 20 19  ALT 19 20  ALKPHOS 121 106  BILITOT 0.3 0.5  PROT 7.2 6.5  ALBUMIN 3.7 3.4*   No results for input(s): LIPASE, AMYLASE in the last 168 hours. No results for input(s): AMMONIA in the last 168 hours. Coagulation Profile: No results for input(s): INR, PROTIME in the last 168 hours. Cardiac Enzymes: Recent Labs  Lab 10/14/20 1553 10/16/20 0241  CKTOTAL 104 37*   BNP (last 3 results) No results for input(s): PROBNP in the last 8760 hours. HbA1C: Recent Labs    10/15/20 0319  HGBA1C 8.2*   CBG: Recent Labs  Lab 10/15/20 1130 10/15/20 1635 10/15/20 2137 10/16/20 0713 10/16/20 1142  GLUCAP 205* 147* 212* 124* 178*   Lipid Profile: No results for input(s): CHOL, HDL, LDLCALC, TRIG, CHOLHDL, LDLDIRECT in the last 72 hours. Thyroid Function Tests: Recent Labs    10/16/20 0241  TSH 2.333   Anemia Panel: No results for input(s): VITAMINB12,  FOLATE, FERRITIN, TIBC, IRON, RETICCTPCT in the last 72 hours. Sepsis Labs: No results for input(s): PROCALCITON, LATICACIDVEN in the last 168 hours.  Recent Results (from the past 240 hour(s))  SARS CORONAVIRUS 2 (TAT 6-24 HRS) Nasopharyngeal Nasopharyngeal Swab     Status: None   Collection Time: 10/14/20  9:35 PM   Specimen: Nasopharyngeal Swab  Result Value Ref Range Status   SARS Coronavirus 2 NEGATIVE NEGATIVE Final    Comment: (NOTE) SARS-CoV-2 target nucleic acids are NOT DETECTED.  The SARS-CoV-2 RNA is generally detectable in upper  and lower respiratory specimens during the acute phase of infection. Negative results do not preclude SARS-CoV-2 infection, do not rule out co-infections with other pathogens, and should not be used as the sole basis for treatment or other patient management decisions. Negative results must be combined with clinical observations, patient history, and epidemiological information. The expected result is Negative.  Fact Sheet for Patients: HairSlick.no  Fact Sheet for Healthcare Providers: quierodirigir.com  This test is not yet approved or cleared by the [place] FDA and  has been authorized for detection and/or diagnosis of SARS-CoV-2 by FDA under an Emergency Use Authorization (EUA). This EUA will remain  in effect (meaning this test can be used) for the duration of the COVID-19 declaration under Se ction 564(b)(1) of the Act, 21 U.S.C. section 360bbb-3(b)(1), unless the authorization is terminated or revoked sooner.  Performed at [facility], [address], [place], [place] [address]          Radiology Studies: DG Chest 2 View  Result Date: 10/14/2020 CLINICAL DATA:  Fall EXAM: CHEST - 2 VIEW COMPARISON:  09/26/2016 FINDINGS: Mild hyperinflation. Heart and mediastinal contours are within normal limits. No focal opacities or effusions. No acute bony abnormality.  IMPRESSION: Mild hyperinflation.  No active cardiopulmonary disease. Electronically Signed   By: Charlett Nose M.D.   On: 10/14/2020 16:37   DG Thoracic Spine 2 View  Result Date: 10/14/2020 CLINICAL DATA:  Fall, pain EXAM: THORACIC SPINE 2 VIEWS COMPARISON:  None. FINDINGS: Degenerative changes throughout the thoracic spine with disc space narrowing and spurring. Normal alignment. No fracture or focal bone lesion. IMPRESSION: Degenerative changes.  No acute bony abnormality Electronically Signed   By: Charlett Nose M.D.   On: 10/14/2020 16:36   DG Lumbar Spine Complete  Result Date: 10/14/2020 CLINICAL DATA:  Fall, pain EXAM: LUMBAR SPINE - COMPLETE 4+ VIEW COMPARISON:  None. FINDINGS: Diffuse degenerative disc and facet disease. 4 mm anterolisthesis of L5 on S1 related to degenerative facet disease. No fracture. SI joints symmetric and unremarkable. IMPRESSION: Diffuse degenerative disc and facet disease. No acute bony abnormality. Electronically Signed   By: Charlett Nose M.D.   On: 10/14/2020 16:36   MR LUMBAR SPINE WO CONTRAST  Result Date: 10/14/2020 CLINICAL DATA:  Chronic low back pain with bilateral radiculopathy EXAM: MRI LUMBAR SPINE WITHOUT CONTRAST TECHNIQUE: Multiplanar, multisequence MR imaging of the lumbar spine was performed. No intravenous contrast was administered. COMPARISON:  MRI 10/01/2017 FINDINGS: Segmentation:  Standard. Alignment: 5 mm grade 1 anterolisthesis L5 on S1. Trace retrolisthesis of T12 on L1 and L1 on L2. Vertebrae: No fracture, evidence of discitis, or bone lesion. Discogenic endplate marrow changes at L4-5. Conus medullaris and cauda equina: Conus extends to the L2 level. Conus and cauda equina appear normal. Paraspinal and other soft tissues: No acute abnormality. Disc levels: T12-L1: Mild circumferential disc bulge and endplate spurring, most pronounced within the left foraminal zone. No canal stenosis. Mild left foraminal stenosis. Unchanged. L1-L2: Progressive  disc height loss with diffuse disc osteophyte complex. Mild bilateral facet arthropathy. Mild canal stenosis with mild bilateral foraminal stenosis. Findings progressed from prior. L2-L3: Mild disc bulge, slightly eccentric to the right. Mild bilateral facet hypertrophy. Mild right foraminal stenosis. No canal stenosis. Unchanged. L3-L4: No disc protrusion. Mild bilateral facet hypertrophy. No foraminal or canal stenosis. Unchanged. L4-L5: Progressive disc height loss with circumferential disc bulge and endplate spurring. Bilateral facet arthropathy and ligamentum flavum buckling. Findings contribute to moderate canal stenosis with severe bilateral  foraminal stenosis. Findings progressed from prior. L5-S1: Anterolisthesis with disc uncovering and diffuse disc bulge. Bilateral facet arthropathy. Severe bilateral foraminal stenosis. No canal stenosis. Unchanged. IMPRESSION: 1. Multilevel lumbar spondylosis, as above. Findings have progressed at the L1-2 and L4-5 levels. 2. Moderate canal stenosis and severe bilateral foraminal stenosis at L4-5. 3. Severe bilateral foraminal stenosis at L5-S1. Electronically Signed   By: Duanne Guess D.O.   On: 10/14/2020 19:27        Scheduled Meds: . amitriptyline  10 mg Oral QHS  . ARIPiprazole  5 mg Oral Daily  . aspirin EC  81 mg Oral Daily  . enoxaparin (LOVENOX) injection  40 mg Subcutaneous Q24H  . fenofibrate  54 mg Oral Daily  . gabapentin  800 mg Oral TID  . glipiZIDE  2.5 mg Oral QAC breakfast  . insulin aspart  0-15 Units Subcutaneous TID WC  . insulin aspart  0-5 Units Subcutaneous QHS  . insulin detemir  24 Units Subcutaneous QHS  . magnesium gluconate  500 mg Oral QHS  . oxyCODONE  10 mg Oral Q12H  . pravastatin  80 mg Oral Daily  . sertraline  100 mg Oral QHS  . vitamin B-12  1,000 mcg Oral Daily   Continuous Infusions: . 0.9 % NaCl with KCl 40 mEq / L 100 mL/hr at 10/16/20 1047     LOS: 2 days   Alwyn Ren, MD  10/16/2020,  2:33 PM

## 2020-10-16 NOTE — Plan of Care (Signed)
  Problem: Activity: Goal: Risk for activity intolerance will decrease Outcome: Progressing   Problem: Nutrition: Goal: Adequate nutrition will be maintained Outcome: Progressing   Problem: Pain Managment: Goal: General experience of comfort will improve Outcome: Progressing   Problem: Safety: Goal: Ability to remain free from injury will improve Outcome: Progressing   

## 2020-10-16 NOTE — Evaluation (Signed)
Physical Therapy Evaluation Patient Details Name: [name] MRN: [id number] DOB: [birth year] Today's Date: 10/16/2020   History of Present Illness  65 year old female with history of bilateral knee replacement, left knee replaced January 2022, anxiety disorder, CAD, diabetes, depression, asthma, kidney stones with history of ureteral stents admitted with multiple falls at home.  She thinks the last time she walked normal was in April.  She reports her legs/knees gave way when she tries to walk.  She does not have any complaints of knee pain.  At baseline she uses a walker or a cane. Cervical MRI pending  Clinical Impression  Pt admitted with above diagnosis.  Pt currently with functional limitations due to the deficits listed below (see PT Problem List). Pt will benefit from skilled PT to increase their independence and safety with mobility to allow discharge to the venue listed below.   Pt able to sit EOB and transfer to chair with min-mod assist, dizzy initially--reports she hasn't been OOB since admission. Strong hx of falls, unable to stand or amb for longer periods of time. Pt will likely need SNF post acute. Continue to follow in acute setting     Follow Up Recommendations SNF   Equipment Recommendations  None recommended by PT    Recommendations for Other Services       Precautions / Restrictions Precautions Precautions: Fall Restrictions Weight Bearing Restrictions: No      Mobility  Bed Mobility Overal bed mobility: Needs Assistance Bed Mobility: Supine to Sit     Supine to sit: Min assist     General bed mobility comments: assist to elevate trunk, incr time, use of rail    Transfers Overall transfer level: Needs assistance Equipment used: Rolling walker (2 wheeled) Transfers: Sit to/from [employer] Sit to Stand: Mod assist Stand pivot transfers: Mod assist       General transfer comment: sit<>stand x2; assist to rise and transition to RW,  wife BOS, inr time and assist to complete hip and trunk extension; assist to prevent knee buckling for stand pivot  Ambulation/Gait             General Gait Details: stand pivot only  Stairs            Wheelchair Mobility    Modified Rankin (Stroke Patients Only)       Balance Overall balance assessment: Needs assistance Sitting-balance support: No upper extremity supported;Feet supported Sitting balance-Leahy Scale: Fair Sitting balance - Comments: initially requiring UE support d/t dizziness; once dizziness resolved able to maintain midline     Standing balance-Leahy Scale: Poor Standing balance comment: heavily reliant on UEs Pt endorses strogn hx of falls, has been crawling at times d/t falls or crawling to get to surface to assist getting up; notable scabs over L knee and bil elbows                             Pertinent Vitals/Pain Pain Assessment: 0-10 Pain Score: 4  Pain Location: L knee Pain Descriptors / Indicators: Tightness Pain Intervention(s): Limited activity within patient's tolerance;Monitored during session;Premedicated before session;Repositioned    Home Living Family/patient expects to be discharged to:: Private residence   Available Help at Discharge: Family;Available 24 hours/day Type of Home: Mobile home Home Access: Stairs to enter Entrance Stairs-Rails: Right Entrance Stairs-Number of Steps: 4 Home Layout: One level Home Equipment: Cane - single point;Bedside commode;Walker - 2 wheels Additional Comments: Pt will be assisted  by children and grandchildren    Prior Function Level of Independence: Independent;Independent with assistive device(s)               Hand Dominance        Extremity/Trunk Assessment   Upper Extremity Assessment Upper Extremity Assessment: Generalized weakness    Lower Extremity Assessment Lower Extremity Assessment: LLE deficits/detail LLE Deficits / Details: TKA Jan 2022; AROM  grossly WFL, knee extension  3+/5, knee flexion 4/5       Communication   Communication: No difficulties  Cognition Arousal/Alertness: Awake/alert Behavior During Therapy: WFL for tasks assessed/performed Overall Cognitive Status: Within Functional Limits for tasks assessed                                        General Comments      Exercises General Exercises - Lower Extremity Ankle Circles/Pumps: AROM;Both;10 reps Quad Sets: AROM;Both;5 reps   Assessment/Plan    PT Assessment Patient needs continued PT services  PT Problem List Decreased strength;Decreased range of motion;Decreased activity tolerance;Decreased mobility;Decreased balance       PT Treatment Interventions DME instruction;Therapeutic activities;Gait training;Functional mobility training;Balance training;Therapeutic exercise;Patient/family education    PT Goals (Current goals can be found in the Care Plan section)  Acute Rehab PT Goals Patient Stated Goal: maybe go to rehab PT Goal Formulation: With patient Time For Goal Achievement: 10/30/20 Potential to Achieve Goals: Good    Frequency Min 3X/week   Barriers to discharge        Co-evaluation               AM-PAC PT "6 Clicks" Mobility  Outcome Measure Help needed turning from your back to your side while in a flat bed without using bedrails?: A Little Help needed moving from lying on your back to sitting on the side of a flat bed without using bedrails?: A Little Help needed moving to and from a bed to a chair (including a wheelchair)?: A Lot Help needed standing up from a chair using your arms (e.g., wheelchair or bedside chair)?: A Lot Help needed to walk in hospital room?: A Lot Help needed climbing 3-5 steps with a railing? : Total 6 Click Score: 13    End of Session Equipment Utilized During Treatment: Gait belt Activity Tolerance: Patient limited by fatigue Patient left: in chair;with call bell/phone within  reach;with chair alarm set Nurse Communication: Mobility status PT Visit Diagnosis: Other abnormalities of gait and mobility (R26.89);Muscle weakness (generalized) (M62.81)    Time: 8413-2440 PT Time Calculation (min) (ACUTE ONLY): 25 min   Charges:   PT Evaluation $PT Eval Low Complexity: 1 Low PT Treatments $Therapeutic Activity: 8-22 mins        Delice Bison, PT  Acute Rehab Dept ([facility]) [phone] Pager [phone]  10/16/2020   [facility] 10/16/2020, 3:36 PM

## 2020-10-16 NOTE — Plan of Care (Signed)
  Problem: Pain Managment: Goal: General experience of comfort will improve Outcome: Progressing   Problem: Safety: Goal: Ability to remain free from injury will improve Outcome: Progressing   

## 2020-10-16 NOTE — Progress Notes (Signed)
Patient seen and examined at the request of my partner Dr. Dorene Grebe.  Problems with gait progressive over the last 12 to 18 months.  Previous total knee arthroplasty and later revision.  4 corner fusion of her wrist, known osteoarthritis.  MRI shows some moderate L4-5 and foraminal stenosis.  She has numbness in her hands and likely has cervical stenosis most likely at C5-6.  MRI scan ordered full consult to follow.

## 2020-10-17 DIAGNOSIS — W19XXXA Unspecified fall, initial encounter: Secondary | ICD-10-CM | POA: Diagnosis not present

## 2020-10-17 DIAGNOSIS — E119 Type 2 diabetes mellitus without complications: Secondary | ICD-10-CM | POA: Diagnosis not present

## 2020-10-17 DIAGNOSIS — Y92009 Unspecified place in unspecified non-institutional (private) residence as the place of occurrence of the external cause: Secondary | ICD-10-CM | POA: Diagnosis not present

## 2020-10-17 LAB — URINALYSIS, ROUTINE W REFLEX MICROSCOPIC
Bilirubin Urine: NEGATIVE
Glucose, UA: NEGATIVE mg/dL
Ketones, ur: NEGATIVE mg/dL
Leukocytes,Ua: NEGATIVE
Nitrite: NEGATIVE
Protein, ur: NEGATIVE mg/dL
Specific Gravity, Urine: 1.006 (ref 1.005–1.030)
pH: 6 (ref 5.0–8.0)

## 2020-10-17 LAB — GLUCOSE, CAPILLARY
Glucose-Capillary: 139 mg/dL — ABNORMAL HIGH (ref 70–99)
Glucose-Capillary: 210 mg/dL — ABNORMAL HIGH (ref 70–99)
Glucose-Capillary: 292 mg/dL — ABNORMAL HIGH (ref 70–99)
Glucose-Capillary: 247 mg/dL — ABNORMAL HIGH (ref 70–99)

## 2020-10-17 MED ORDER — DEXAMETHASONE 4 MG PO TABS
4.0000 mg | ORAL_TABLET | Freq: Two times a day (BID) | ORAL | Status: DC
  Administered 2020-10-17: 4 mg via ORAL
  Filled 2020-10-17: qty 1

## 2020-10-17 NOTE — Care Management Important Message (Signed)
Important Message  Patient Details IM Letter given to the Patient. Name: [name] MRN: [id number] Date of Birth: 1956/03/08   Medicare Important Message Given:  Yes     Emaly, Boschert 10/17/2020, 12:12 PM

## 2020-10-17 NOTE — Progress Notes (Signed)
Notified by Radiologist that C-spine MRI shows severe cord compression at C5-6, C6-7.  Pt placed on fall precautions and up with assistance only due to high fall risk

## 2020-10-17 NOTE — Progress Notes (Signed)
PROGRESS NOTE    [name]  ION:[id number] DOB: Dec 03, 1955 DOA: 10/14/2020 PCP: Courtney Paris, NP   Brief Narrative: 65 year old female with history of bilateral knee replacement, left knee replaced January 2022, anxiety disorder, CAD, diabetes, depression, asthma, kidney stones with history of ureteral stents admitted with multiple falls at home.  She thinks the last time she walked normal was in April.  She reports her legs/knees gave way when she tries to walk.  She does not have any complaints of knee pain.  At baseline she uses a walker or a cane. MRI of the lumbar spine with spinal stenosis.  ED physician discussed with neurosurgery who did not think her symptoms are related to spinal stenosis. she lives at home with her daughter. Her blood pressure was 76/62 on arrival to the ED.  Pulse was 93. MRI of the cervical spine 10/16/2020 with severe spinal stenosis with cord compression.  She is being transferred to [facility] for surgery tomorrow by Dr. Ophelia Charter.  Assessment & Plan:   Principal Problem:   Fall at home, initial encounter Active Problems:   GERD   Diabetes mellitus type 2, noninsulin dependent (HCC)   Chronic joint pain   Asthma   S/P total knee arthroplasty, left   Weakness    #1 multiple falls at home likely multifactorial.     She is noted to have hypotension causing dizziness and fall, polypharmacy, neuropathy, she is status post bilateral knee replacement the recent one  being the left knee in 2022. MRI of the lumbar spine with severe foraminal stenosis. MRI of the cervical spine with severe spinal stenosis and cord compression. Patient has been having tingling and numbness in the upper extremities for months. Transferring patient to [facility] for possible surgery tomorrow by Dr. Ophelia Charter.  Discussed with Dr. Ophelia Charter. N.p.o. after midnight. Her blood pressure has improved with normal saline. Seen by physical therapy recommending SNF. She does have lumbar spine stenosis not  severe enough to cause weakness. 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 Saturdays at home Janumet 50 over 1000 mg 1 tablet twice a day at home Levemir 25 units at bedtime, will increase to 10 units in the morning and 25 units at bedtime. Neurontin 800 mg 3 times a day  #3 history of osteoarthritis status post bilateral knee replaced  #4 history of asthma stable  # 5 GERD stable  #6 hyperlipidemia on Pravachol  #7 hypokalemia and hypomagnesemia-replete and recheck  #8 lumbar spinal stenosis-MRI Multilevel lumbar spondylosis, as above. Findings have progressed at the L1-2 and L4-5 levels.  Moderate canal stenosis and severe bilateral foraminal stenosis at L4-5. Severe bilateral foraminal stenosis at L5-S1.  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  This is thought to be chronic as her symptoms have been present over multiple weeks to months. Estimated body mass index is 29.29 kg/m as calculated from the following:   Height as of this encounter: 5\' 3"  (1.6 m).   Weight as of this encounter: 75 kg.  DVT prophylaxis: Lovenox  code Status: Full code Family Communication: Discussed with daughter disposition Plan:  Status is: Inpatient  Dispo: The patient is from: Home              Anticipated d/c is to: SNF              Patient currently is not medically stable to d/c.   Difficult to place patient No    Consultants: none  Procedures:none Antimicrobials: none  Subjective: No knee pain  Feels very weak  C/o dizzy  Has bilateral  upper extremity tingling and numbness and bilateral lower extremity weakness  Objective: Vitals:   10/16/20 0743 10/16/20 1308 10/16/20 2244 10/17/20 0446  BP:  (!) 121/53 (!) 144/76 130/70  Pulse:  74 82 73  Resp:  18 16 16   Temp:   97.7 F (36.5 C) (!) 97.4 F (36.3 C)  TempSrc:   Oral Oral  SpO2: 94% 96% 93% 100%  Weight:      Height:        Intake/Output Summary (Last 24 hours) at 10/17/2020 1222 Last data filed at 10/17/2020 9147 Gross per 24 hour  Intake 1703.18 ml  Output 3950 ml  Net -2246.82 ml   Filed Weights   10/14/20 1513  Weight: 75 kg    Examination:  General exam: Appears calm and comfortable  Respiratory system: Clear to auscultation. Respiratory effort normal. Cardiovascular system: S1 & S2 heard, RRR. No JVD, murmurs, rubs, gallops or clicks. No pedal edema. Gastrointestinal system: Abdomen is nondistended, soft and nontender. No organomegaly or masses felt. Normal bowel sounds heard. Central nervous system: Awake alert and oriented decree sensation to the upper and lower extremities  Unable to raise legs in bed extremities:trace edema scars noted on both knees Skin: No rashes, lesions or ulcers Psychiatry: Judgement and insight appear normal. Mood & affect appropriate.     Data Reviewed: I have personally reviewed following labs and imaging studies  CBC: Recent Labs  Lab 10/14/20 1553 10/15/20 0319  WBC 7.1 6.8  NEUTROABS 5.0  --   HGB 11.5* 11.1*  HCT 37.2 36.6  MCV 86.1 86.7  PLT 219 209   Basic Metabolic Panel: Recent Labs  Lab 10/14/20 1553 10/15/20 0319 10/15/20 1401 10/16/20 0241  NA 144 141  --  141  K 3.7 3.2*  --  4.7  CL 106 103  --  106  CO2 32 30  --  30  GLUCOSE 267* 187*  --  145*  BUN 17 12  --  17  CREATININE 0.65 0.52  --  0.79  CALCIUM 9.7 9.3  --  9.3  MG 1.6*  --  1.7 1.8   GFR: Estimated Creatinine Clearance: 70.7 mL/min (by C-G formula based on SCr of 0.79 mg/dL). Liver Function Tests: Recent Labs   Lab 10/14/20 1553 10/15/20 0319  AST 20 19  ALT 19 20  ALKPHOS 121 106  BILITOT 0.3 0.5  PROT 7.2 6.5  ALBUMIN 3.7 3.4*   No results for input(s): LIPASE, AMYLASE in the last 168 hours. No results for input(s): AMMONIA in the last 168 hours. Coagulation Profile: No results for input(s): INR, PROTIME in the last 168 hours. Cardiac Enzymes: Recent Labs  Lab 10/14/20 1553 10/16/20 0241  CKTOTAL 104 37*   BNP (last 3 results) No results for input(s): PROBNP in the last 8760 hours. HbA1C: Recent Labs    10/15/20 0319  HGBA1C 8.2*   CBG: Recent Labs  Lab 10/16/20 1142  10/16/20 1624 10/16/20 2241 10/17/20 0735 10/17/20 1122  GLUCAP 178* 134* 231* 139* 210*   Lipid Profile: No results for input(s): CHOL, HDL, LDLCALC, TRIG, CHOLHDL, LDLDIRECT in the last 72 hours. Thyroid Function Tests: Recent Labs    10/16/20 0241  TSH 2.333   Anemia Panel: No results for input(s): VITAMINB12, FOLATE, FERRITIN, TIBC, IRON, RETICCTPCT in the last 72 hours. Sepsis Labs: No results for input(s): PROCALCITON, LATICACIDVEN in the last 168 hours.  Recent Results (from the past 240 hour(s))  SARS CORONAVIRUS 2 (TAT 6-24 HRS) Nasopharyngeal Nasopharyngeal Swab     Status: None   Collection Time: 10/14/20  9:35 PM   Specimen: Nasopharyngeal Swab  Result Value Ref Range Status   SARS Coronavirus 2 NEGATIVE NEGATIVE Final    Comment: (NOTE) SARS-CoV-2 target nucleic acids are NOT DETECTED.  The SARS-CoV-2 RNA is generally detectable in upper and lower respiratory specimens during the acute phase of infection. Negative results do not preclude SARS-CoV-2 infection, do not rule out co-infections with other pathogens, and should not be used as the sole basis for treatment or other patient management decisions. Negative results must be combined with clinical observations, patient history, and epidemiological information. The expected result is Negative.  Fact Sheet for  Patients: HairSlick.no  Fact Sheet for Healthcare Providers: quierodirigir.com  This test is not yet approved or cleared by the [place] FDA and  has been authorized for detection and/or diagnosis of SARS-CoV-2 by FDA under an Emergency Use Authorization (EUA). This EUA will remain  in effect (meaning this test can be used) for the duration of the COVID-19 declaration under Se ction 564(b)(1) of the Act, 21 U.S.C. section 360bbb-3(b)(1), unless the authorization is terminated or revoked sooner.  Performed at [facility], [address], [place], [place] [address]          Radiology Studies: DG Cervical Spine Complete  Result Date: 10/16/2020 CLINICAL DATA:  65 year old female with bilateral hand numbness. EXAM: CERVICAL SPINE - COMPLETE 4+ VIEW COMPARISON:  None. FINDINGS: There is no acute fracture or subluxation of the cervical spine. There is mild straightening of normal cervical lordosis which may be positional or due to muscle spasm. Multilevel degenerative changes with disc space narrowing, endplate irregularity and anterior osteophyte most prominent at C5-C6. The visualized posterior elements and odontoid appear intact. There is anatomic alignment of the lateral masses of C1 and C2. The soft tissues are unremarkable. IMPRESSION: 1. No acute/traumatic cervical spine pathology. 2. Multilevel degenerative changes. Electronically Signed   By: Elgie Collard M.D.   On: 10/16/2020 18:15   MR CERVICAL SPINE W WO CONTRAST  Result Date: 10/17/2020 CLINICAL DATA:  Initial evaluation for progressive myelopathy, gait abnormality, weakness. History of fall. EXAM: MRI CERVICAL SPINE WITHOUT AND WITH CONTRAST TECHNIQUE: Multiplanar and multiecho pulse sequences of the cervical spine, to include the craniocervical junction and cervicothoracic junction, were obtained without and with intravenous contrast. CONTRAST:  7mL GADAVIST  GADOBUTROL 1 MMOL/ML IV SOLN COMPARISON:  Prior radiograph from earlier the same day. FINDINGS: Alignment: Straightening of the normal cervical lordosis. No listhesis or malalignment. Vertebrae: Vertebral body height maintained without acute or chronic fracture. T2 and partially visualized T3 vertebral bodies are somewhat small and partially fused, likely congenital in nature. Underlying bone marrow signal intensity within normal limits. No discrete or worrisome osseous lesions. Discogenic reactive endplate change present about the C5-6 and C6-7 interspaces. No other abnormal marrow edema or enhancement. Cord: Suspected patchy cord signal abnormality at the  level of C5-6, suggesting compressive myelopathy. Additional possible patchy signal abnormality at the level of C6-7 as well. Signal intensity within the cervical spinal cord is otherwise within normal limits. Posterior Fossa, vertebral arteries, paraspinal tissues: Visualized brain and posterior fossa within normal limits. Craniocervical junction normal. Paraspinous and prevertebral soft tissues within normal limits. Normal flow voids seen within the vertebral arteries bilaterally. Disc levels: C2-C3: Negative interspace. Bilateral facet hypertrophy. No canal or foraminal stenosis. C3-C4: Degenerative intervertebral disc space narrowing with diffuse disc bulge and bilateral uncovertebral spurring. Left greater than right facet degeneration. No spinal stenosis. Severe left with moderate right C4 foraminal narrowing. C4-C5: Mild disc bulge with uncovertebral and endplate spurring. Posterior disc osteophyte flattens and partially effaces the ventral thecal sac with no more than mild spinal stenosis. No cord impingement. Superimposed left-sided facet degeneration. Severe bilateral C5 foraminal stenosis. C5-C6: Degenerative intervertebral disc space narrowing with diffuse disc osteophyte complex. Broad posterior component flattens and effaces the ventral thecal sac.  Secondary severe spinal stenosis with compression of the cervical spinal cord and probable cord signal changes. Thecal sac narrowed to approximately 2 mm in AP diameter at its most narrow point (series 7, image 25). Severe bilateral C6 foraminal narrowing. C6-C7: Degenerative intervertebral disc space narrowing with diffuse disc osteophyte complex. Broad posterior component flattens and effaces the ventral thecal sac, asymmetric to the right. Resultant severe spinal stenosis with cord flattening. Possible subtle patchy cord signal changes at this level, not entirely certain on this motion degraded exam. Thecal sac measures 5 mm in AP diameter at its most narrow point. Severe right worse than left C7 foraminal stenosis. C7-T1: Degenerative intervertebral disc space narrowing with mild disc bulge and uncovertebral spurring. No significant spinal stenosis. Foramina appear patent. Visualized upper thoracic spine demonstrates no other significant finding. IMPRESSION: 1. 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 Suspected patchy cord signal abnormality at C5-6 and possibly C6-7, suggesting myelopathy. 2. No other acute abnormality within the cervical spine. 3. Additional multifactorial degenerative changes with resultant severe bilateral C4 through C7 foraminal stenosis as above. Findings communicated to the covering physician for this patient Dr. Rachael Darby at 4:30 a.m. on 10/17/2020. Electronically Signed   By: Rise Mu M.D.   On: 10/17/2020 04:45        Scheduled Meds: . amitriptyline  10 mg Oral QHS  . ARIPiprazole  5 mg Oral Daily  . aspirin EC  81 mg Oral Daily  . dexamethasone  4 mg Oral Q12H  . enoxaparin (LOVENOX) injection  40 mg Subcutaneous Q24H  . fenofibrate  54 mg Oral Daily  . gabapentin  800 mg Oral TID  . glipiZIDE  2.5 mg Oral QAC breakfast  . insulin aspart  0-15 Units Subcutaneous TID WC  .  insulin aspart  0-5 Units Subcutaneous QHS  . insulin detemir  24 Units Subcutaneous QHS  . magnesium gluconate  500 mg Oral QHS  . oxyCODONE  10 mg Oral Q12H  . pravastatin  80 mg Oral Daily  . sertraline  100 mg Oral QHS  . vitamin B-12  1,000 mcg Oral Daily   Continuous Infusions: . 0.9 % NaCl with KCl 40 mEq / L 100 mL/hr at 10/17/20 0751     LOS: 3 days   Alwyn Ren, MD  10/17/2020, 12:22 PM

## 2020-10-17 NOTE — Progress Notes (Addendum)
Review cervical MRI scan and report.  Call patient on the phone on her cell phone and discussed with her findings of the cervical MRI.  As expected it shows severe stenosis with cord compression and she will require discectomy at both C5-6 and C6-7 level with two-level fusion and likely corpectomy at C6.  We will try to get her on the schedule for either Wednesday tomorrow or with my partner Friday.  Discussed with patient that with the cord compression she may get some improvement after surgery or it may take several months before she gets some improvement.Ok for [employer] no chemical prophylaxis anti DVT due to upcoming spinal cord surgery.

## 2020-10-17 NOTE — Progress Notes (Signed)
My partner Dr. [name] will assume care of the patient and she has been posted for surgery on Friday afternoon for decompression and fusion for her severe stenosis.  I discussed with patient I am leaving town Thursday morning.

## 2020-10-18 DIAGNOSIS — W19XXXA Unspecified fall, initial encounter: Secondary | ICD-10-CM | POA: Diagnosis not present

## 2020-10-18 DIAGNOSIS — E119 Type 2 diabetes mellitus without complications: Secondary | ICD-10-CM | POA: Diagnosis not present

## 2020-10-18 DIAGNOSIS — I2510 Atherosclerotic heart disease of native coronary artery without angina pectoris: Secondary | ICD-10-CM | POA: Diagnosis not present

## 2020-10-18 DIAGNOSIS — Z79899 Other long term (current) drug therapy: Secondary | ICD-10-CM

## 2020-10-18 DIAGNOSIS — G9520 Unspecified cord compression: Secondary | ICD-10-CM

## 2020-10-18 DIAGNOSIS — M4802 Spinal stenosis, cervical region: Secondary | ICD-10-CM

## 2020-10-18 LAB — GLUCOSE, CAPILLARY
Glucose-Capillary: 187 mg/dL — ABNORMAL HIGH (ref 70–99)
Glucose-Capillary: 182 mg/dL — ABNORMAL HIGH (ref 70–99)
Glucose-Capillary: 153 mg/dL — ABNORMAL HIGH (ref 70–99)
Glucose-Capillary: 263 mg/dL — ABNORMAL HIGH (ref 70–99)

## 2020-10-18 NOTE — Plan of Care (Signed)
Plan of care reviewed and discussed with the patient. 

## 2020-10-18 NOTE — Progress Notes (Signed)
PROGRESS NOTE  [name] ERD:[id number] DOB: 1956/03/24 DOA: 10/14/2020 PCP: Courtney Paris, NP  Brief History   65 year old woman PMH including diabetes presented after multiple falls at home and right leg weakness. MRI of the lumbar spine showed moderate canal stenosis and severe bilateral foraminal stenosis but was per neurosurgery thought to be noncontributory.  She was seen by orthopedics and found to have hand numbness concerning for cervical myelopathy which was confirmed by MRI showing cord compression.  A & P  Severe spinal stenosis and cord compression C5-C6 and C6-7, uggesting myelopathysevere bilateral foraminal stenosis C4-C7. s with history of multiple falls at home, hand numbness and leg weakness.   --Plan for discectomy per orthopedic surgery Friday, transfer to [facility] as per request of surgeon.  Low back pain secondary to moderate canal stenosis and severe bilateral foraminal stenosis L4-5, L5-S1 --Thought to be noncontributory surgery.  Defer management to surgery.  F/u as an outpatient with surgery  Polypharmacy including Per chart Percocet, Zoloft, Zanaflex, Ambien, OxyContin, Skelaxin, buprenorphine patch, Abilify --Recommend consultation as an outpatient for weaning off medications as able.  DM type 2, diabetic peripheral neuropathy, Hgb A1c 8.2 --CBG stable --Continue current management including Levemir, Neurontin.  Can resume oral medications on discharge.  Coronary artery disease --Appears to be asymptomatic.  Unclear why troponins were ordered on admission, trivial elevation, no further evaluation suggested.  EKG sinus rhythm, trigeminy, no acute changes.  No recent cardiac symptoms.  Recommend proceeding with surgery.  Disposition Plan:  Discussion:   Status is: Inpatient  Remains inpatient appropriate because:Inpatient level of care appropriate due to severity of illness   Dispo: The patient is from: Home              Anticipated d/c is to: TBD               Patient currently is not medically stable to d/c.   Difficult to place patient No  DVT prophylaxis: enoxaparin (LOVENOX) injection 40 mg Start: 10/15/20 1000   Code Status: Full Code Level of care: Med-Surg Family Communication: none  Brendia Sacks, MD  [facility] Direct contact: see [email or web address] (further directions at bottom of note if needed) 7PM-7AM contact night coverage as at bottom of note 10/18/2020, 10:27 AM  LOS: 4 days   Significant Hospital Events   . 5/21 admit for falls   Consults:  . Orthopedics    Procedures:  . Planned cervical diskectomy 5/27  Interval History/Subjective  CC: f/u back pain  Reports ongoing back pain mid to lower back, radiation down right leg.  Able to move both legs.  Sensation intact.  No other issues reported.  And heart attack about 10 years ago, no issues since.  No chest pain or shortness of breath when ambulating.  Objective   Vitals:  Vitals:   10/17/20 2244 10/18/20 0537  BP: (!) 155/70 (!) 153/65  Pulse: (!) 44 90  Resp: 16 16  Temp: 97.7 F (36.5 C) 99.1 F (37.3 C)  SpO2: 94% 96%    Exam:  Constitutional:   . Appears calm, mildly uncomfortable ENMT:  . grossly normal hearing  Respiratory:  . CTA bilaterally, no w/r/r.  . Respiratory effort normal.  Cardiovascular:  . RRR, no m/r/g . No LE extremity edema   Musculoskeletal:  . RLE, LLE   . Moves both legs to command, sensation grossly intact bilateral LE Psychiatric:  . Mental status o Mood, affect appropriate  I have personally reviewed the following:  Today's Data  . CBG stable  Scheduled Meds: . amitriptyline  10 mg Oral QHS  . ARIPiprazole  5 mg Oral Daily  . aspirin EC  81 mg Oral Daily  . enoxaparin (LOVENOX) injection  40 mg Subcutaneous Q24H  . fenofibrate  54 mg Oral Daily  . gabapentin  800 mg Oral TID  . insulin aspart  0-15 Units Subcutaneous TID WC  . insulin aspart  0-5 Units Subcutaneous QHS  . insulin detemir  24 Units  Subcutaneous QHS  . magnesium gluconate  500 mg Oral QHS  . oxyCODONE  10 mg Oral Q12H  . pravastatin  80 mg Oral Daily  . sertraline  100 mg Oral QHS  . vitamin B-12  1,000 mcg Oral Daily   Continuous Infusions:  Principal Problem:   Fall at home, initial encounter Active Problems:   GERD   Diabetes mellitus type 2, noninsulin dependent (HCC)   Chronic joint pain   Asthma   S/P total knee arthroplasty, left   Weakness   Cervical cord compression with myelopathy (HCC)   Spinal stenosis, cervical region   Polypharmacy   CAD (coronary artery disease)   LOS: 4 days   How to contact the [facility] Attending or Consulting provider 7A - 7P or covering provider during after hours 7P -7A, for this patient?  1. Check the care team in [facility] and look for a) attending/consulting TR[facility] provider listed and b) the [facility] team listed 2. Log into [email or web address] and use [place]'s universal password to access. If you do not have the password, please contact the hospital operator. 3. Locate the [facility] provider you are looking for under [facility] and page to a number that you can be directly reached. 4. If you still have difficulty reaching the provider, please page the [facility] (Director on Call) for the Hospitalists listed on amion for assistance.

## 2020-10-18 NOTE — Consult Note (Addendum)
Reason for Consult:Cervical stenosis with myelopathy Referring Physician: Dr. Lorin Mercy Consulting Physician:Leeandre Nordling [facility]  Orthopedic Diagnosis:Cervical stenosis C5-6 and C6-7 with myelopathy Lumbar spinal stenosis L4-5 Hand swelling at MCP joints with tendon laxity and subluxation previous wrist partial fusion, thumb MC-C arthrosis CAD Diabetes type 2, diet controlled Bilateral Total Knee replacements, post left TKR 05/2020, right 2008 with revision 2013,    LKT:GYBWL S [name] is a right handed 65 y.o. female. With a several month history of increasing leg weakness, difficulty walking. Bilateral upper extremity numbness and tingling. She has difficulty using her hands for gripping and fine motor, buttoning Her clothing. She has difficulty with balance. Fell in February with orbital contusion, CT of Brain and maxilla negative for Acute abnormality. MRI lumbar spine demonstrates multilevel degenerative disc disease with moderate central and lateral  Recess stenosis L4-5. She fell this last Friday and was seen at [facility] lumbar spinal stenosis is felt not to be cause of falling. Underwent evaluation for cervical spinal stenosis with MRI showing severe spinal stenosis at C5-6 greater than C6-7. The AP diameter of the spinal canal at C5-6 is 52m(normal 166m at C6-7 the diameter is 4-8m10mPatchy changes in cord signal Noted at both C5-6 and C6-7. Dr. YatLorin Mercyked me to see Mrs. JudCorrigand treat her cervical condition. She has been scheduled For intervention Friday afternoon at 12:30 with plan for C6 corpectomy with decompression of C5-6 and C6-7, allograft strut Graft with tricortical iliac crest, and fixation with anterior cervical plate and screws.    Past Medical History:  Diagnosis Date  . Anxiety   . Arthritis   . Asthma   . Depression   . Dysrhythmia   . History of GI bleed    2010--- UPPER ESOPHAGITIS/ DUODERAL ULCER/ EROSION  . History of hiatal hernia   . History of kidney stones   .  History of shingles    2013  . [facility] ([facility]   . Nephrolithiasis    BILATERAL  . Right ureteral stone   . Type 2 diabetes, diet controlled ([place] . Urgency of urination     Past Surgical History:  Procedure Laterality Date  . CARDIAC CATHETERIZATION  02-10-2009  DR BENSIMHON   NORMAL CORONARIES/  LOW NORMAL LVF WITHOUT WALL MOTION ABNORMALITIES  . CARDIAC CATHETERIZATION  11-21-2010  DR GANEinar GipNORMAL CORONARIES/  EF 50-55%  . CHOLECYSTECTOMY  [place] CYSTOSCOPY W/ URETERAL STENT PLACEMENT Right 09/04/2013   Procedure: CYSTOSCOPY WITH RETROGRADE PYELOGRAM/URETERAL STENT PLACEMENT;  Surgeon: DanMolli HazardD;  Location: [facility];  Service: Urology;  Laterality: Right;  . CYSTOSCOPY WITH RETROGRADE PYELOGRAM, URETEROSCOPY AND STENT PLACEMENT Right 09/27/2013   Procedure: CYSTOSCOPY WITH RETROGRADE PYELOGRAM, URETEROSCOPY AND STENT EXCHANGE;  Surgeon: DanMolli HazardD;  [facility];  Laterality: Right;  . HOLMIUM LASER APPLICATION Right 5/48/9/3734Procedure: HOLMIUM LASER APPLICATION;  Surgeon: DanMolli HazardD;  [facility];  Laterality: Right;  . HYSTEROSCOPY WITH D & C  01-25-2003  . IR URETERAL STENT RIGHT NEW ACCESS W/O SEP NEPHROSTOMY CATH  07/08/2017  . LAPAROSCOPIC ASSISTED VAGINAL HYSTERECTOMY  03-02-2003   W/  BILATERAL SALPINGOOPHORECTOMY  . LAPAROSCOPY  EXTENSIVE LYSIS ADHESIONS/  REDO PARAESOPHAGEAL HIATAL HERNIA WITH PRIMARY CLOSURE AND MESH/ NISSEN FUNDOPLICATION (1cm)/  REPAIR GASTROTOMY  08-22-2009  . NEPHROLITHOTOMY Right 07/08/2017   Procedure:  NEPHROLITHOTOMY PERCUTANEOUS;  Surgeon: Festus Aloe, MD;  Location: [facility];  Service: Urology;  Laterality: Right;  . NISSEN FUNDOPLICATION  7858   W/  CHOLECYSTECTOMY  . SHOULDER ARTHROSCOPY WITH SUBACROMIAL DECOMPRESSION, ROTATOR CUFF REPAIR AND BICEP TENDON REPAIR  Left 08-02-2010   AND LABRAL DEBRIDEMENT  . TOTAL KNEE ARTHROPLASTY Right 07-01-2006  . TOTAL KNEE ARTHROPLASTY Left 06/08/2020   Procedure: LEFT TOTAL KNEE ARTHROPLASTY;  Surgeon: Meredith Pel, MD;  Location: [facility];  Service: Orthopedics;  Laterality: Left;  . TOTAL KNEE REVISION  02/25/2012   Procedure: TOTAL KNEE REVISION;  Surgeon: Meredith Pel, MD;  Location: [place];  Service: Orthopedics;  Laterality: Right;  Revise right total knee arthroplasty  . TUBAL LIGATION  1990  . WRIST FUSION Left 2004   RETAINED HARDWARE    Family History  Problem Relation Age of Onset  . Heart disease Father     Social History:  reports that she has never smoked. She has never used smokeless tobacco. She reports that she does not drink alcohol and does not use drugs.  Allergies: No Known Allergies  Medications:  Prior to Admission:  Medications Prior to Admission  Medication Sig Dispense Refill Last Dose  . acetaminophen (TYLENOL) 325 MG tablet Take 1-2 tablets (325-650 mg total) by mouth every 6 (six) hours as needed for mild pain (pain score 1-3 or temp > 100.5). 60 tablet 0 10/14/2020 at AM  . albuterol (PROVENTIL HFA;VENTOLIN HFA) 108 (90 Base) MCG/ACT inhaler Inhale 2 puffs into the lungs every 4 (four) hours as needed for wheezing or shortness of breath. For shortness of breath 18 g 0 unk at unk  . albuterol (PROVENTIL) (2.5 MG/3ML) 0.083% nebulizer solution Take 2.5 mg by nebulization every 6 (six) hours as needed for wheezing or shortness of breath.   unk at [employer]  . amitriptyline (ELAVIL) 10 MG tablet Take 10 mg by mouth at bedtime.   10/13/2020 at hs  . ARIPiprazole (ABILIFY) 5 MG tablet Take 5 mg by mouth daily.   10/14/2020 at 530am  . aspirin EC 81 MG tablet Take 1 tablet (81 mg total) by mouth daily.   10/14/2020 at Unknown time  . blood glucose meter kit and supplies Dispense based on patient and insurance preference. Use up to four times daily as directed. (FOR ICD-10 E10.9,  E11.9). 1 each 0 unk at unk  . buprenorphine (BUTRANS) 10 MCG/HR PTWK Place 1 patch onto the skin once a week. Every Saturday   Past Week at Unknown time  . etodolac (LODINE XL) 600 MG 24 hr tablet Take 600 mg by mouth daily as needed.   10/13/2020 at Unknown time  . fenofibrate 54 MG tablet Take 54 mg by mouth daily.   10/13/2020 at AM  . gabapentin (NEURONTIN) 800 MG tablet Take 800 mg by mouth 3 (three) times daily.   10/14/2020 at 530am  . glipiZIDE (GLUCOTROL) 5 MG tablet Take 2.5 mg by mouth daily before breakfast.   10/14/2020 at 530am  . insulin detemir (LEVEMIR FLEXTOUCH) 100 UNIT/ML FlexPen Inject 24 Units into the skin at bedtime.   10/13/2020 at HS  . JANUMET 50-1000 MG tablet Take 1 tablet by mouth 2 (two) times daily.   10/14/2020 at 530am  . Magnesium 500 MG TABS Take 500 mg by mouth at bedtime.   10/13/2020 at HS  . metaxalone (SKELAXIN) 800 MG tablet Take 800 mg by mouth 4 (four) times daily as needed.   Past Week at Unknown  time  . Multiple Vitamins-Minerals (ADVANCED DIABETIC MULTIVITAMIN PO) Take 1 tablet by mouth daily.   10/14/2020 at 530am  . oxyCODONE (OXYCONTIN) 10 mg 12 hr tablet Take 1 tablet (10 mg total) by mouth every 12 (twelve) hours. 14 tablet 0 10/13/2020 at Unknown time  . oxyCODONE-acetaminophen (PERCOCET) 10-325 MG tablet Take 1 tablet by mouth 4 (four) times daily as needed. For breakthrough pain. 15 tablet 0 10/14/2020 at 530am  . OZEMPIC, 1 MG/DOSE, 4 MG/3ML SOPN Inject 1 mg into the muscle every Saturday.   10/07/2020 at unk  . pravastatin (PRAVACHOL) 80 MG tablet Take 80 mg by mouth daily.   10/13/2020 at hs  . sertraline (ZOLOFT) 100 MG tablet Take 100 mg by mouth at bedtime.   10/13/2020 at hs  . tiZANidine (ZANAFLEX) 4 MG capsule Take 4 mg by mouth every 8 (eight) hours as needed for muscle spasms.   10/14/2020 at 530am  . vitamin B-12 (CYANOCOBALAMIN) 1000 MCG tablet Take 1,000 mcg by mouth daily.   10/14/2020 at 530am  . zolpidem (AMBIEN) 5 MG tablet Take 5 mg by  mouth at bedtime as needed for sleep.   unk at unk   Scheduled: . amitriptyline  10 mg Oral QHS  . ARIPiprazole  5 mg Oral Daily  . aspirin EC  81 mg Oral Daily  . enoxaparin (LOVENOX) injection  40 mg Subcutaneous Q24H  . fenofibrate  54 mg Oral Daily  . gabapentin  800 mg Oral TID  . insulin aspart  0-15 Units Subcutaneous TID WC  . insulin aspart  0-5 Units Subcutaneous QHS  . insulin detemir  24 Units Subcutaneous QHS  . magnesium gluconate  500 mg Oral QHS  . oxyCODONE  10 mg Oral Q12H  . pravastatin  80 mg Oral Daily  . sertraline  100 mg Oral QHS  . vitamin B-12  1,000 mcg Oral Daily   Continuous:   Results for orders placed or performed during the hospital encounter of 10/14/20 (from the past 48 hour(s))  Glucose, capillary     Status: Abnormal   Collection Time: 10/16/20 10:41 PM  Result Value Ref Range   Glucose-Capillary 231 (H) 70 - 99 mg/dL    Comment: Glucose reference range applies only to samples taken after fasting for at least 8 hours.  Urinalysis, Routine w reflex microscopic Urine, Clean Catch     Status: Abnormal   Collection Time: 10/17/20  5:04 AM  Result Value Ref Range   Color, Urine STRAW (A) YELLOW   APPearance CLEAR CLEAR   Specific Gravity, Urine 1.006 1.005 - 1.030   pH 6.0 5.0 - 8.0   Glucose, UA NEGATIVE NEGATIVE mg/dL   Hgb urine dipstick SMALL (A) NEGATIVE   Bilirubin Urine NEGATIVE NEGATIVE   Ketones, ur NEGATIVE NEGATIVE mg/dL   Protein, ur NEGATIVE NEGATIVE mg/dL   Nitrite NEGATIVE NEGATIVE   Leukocytes,Ua NEGATIVE NEGATIVE   RBC / HPF 0-5 0 - 5 RBC/hpf   WBC, UA 0-5 0 - 5 WBC/hpf   Bacteria, UA RARE (A) NONE SEEN   Squamous Epithelial / LPF 0-5 0 - 5   Mucus PRESENT     Comment: Performed at [facility], [place] [address] [place], [place] [address]  Glucose, capillary     Status: Abnormal   Collection Time: 10/17/20  7:35 AM  Result Value Ref Range   Glucose-Capillary 139 (H) 70 - 99 mg/dL    Comment: Glucose  reference range applies only to samples taken  after fasting for at least 8 hours.  Glucose, capillary     Status: Abnormal   Collection Time: 10/17/20 11:22 AM  Result Value Ref Range   Glucose-Capillary 210 (H) 70 - 99 mg/dL    Comment: Glucose reference range applies only to samples taken after fasting for at least 8 hours.  Glucose, capillary     Status: Abnormal   Collection Time: 10/17/20  4:21 PM  Result Value Ref Range   Glucose-Capillary 292 (H) 70 - 99 mg/dL    Comment: Glucose reference range applies only to samples taken after fasting for at least 8 hours.  Glucose, capillary     Status: Abnormal   Collection Time: 10/17/20 10:46 PM  Result Value Ref Range   Glucose-Capillary 247 (H) 70 - 99 mg/dL    Comment: Glucose reference range applies only to samples taken after fasting for at least 8 hours.  Glucose, capillary     Status: Abnormal   Collection Time: 10/18/20  7:33 AM  Result Value Ref Range   Glucose-Capillary 187 (H) 70 - 99 mg/dL    Comment: Glucose reference range applies only to samples taken after fasting for at least 8 hours.  Glucose, capillary     Status: Abnormal   Collection Time: 10/18/20 12:23 PM  Result Value Ref Range   Glucose-Capillary 182 (H) 70 - 99 mg/dL    Comment: Glucose reference range applies only to samples taken after fasting for at least 8 hours.  Glucose, capillary     Status: Abnormal   Collection Time: 10/18/20  5:12 PM  Result Value Ref Range   Glucose-Capillary 153 (H) 70 - 99 mg/dL    Comment: Glucose reference range applies only to samples taken after fasting for at least 8 hours.  Glucose, capillary     Status: Abnormal   Collection Time: 10/18/20  9:25 PM  Result Value Ref Range   Glucose-Capillary 263 (H) 70 - 99 mg/dL    Comment: Glucose reference range applies only to samples taken after fasting for at least 8 hours.    MR CERVICAL SPINE W WO CONTRAST  Result Date: 10/17/2020 CLINICAL DATA:  Initial evaluation for  progressive myelopathy, gait abnormality, weakness. History of fall. EXAM: MRI CERVICAL SPINE WITHOUT AND WITH CONTRAST TECHNIQUE: Multiplanar and multiecho pulse sequences of the cervical spine, to include the craniocervical junction and cervicothoracic junction, were obtained without and with intravenous contrast. CONTRAST:  76m GADAVIST GADOBUTROL 1 MMOL/ML IV SOLN COMPARISON:  Prior radiograph from earlier the same day. FINDINGS: Alignment: Straightening of the normal cervical lordosis. No listhesis or malalignment. Vertebrae: Vertebral body height maintained without acute or chronic fracture. T2 and partially visualized T3 vertebral bodies are somewhat small and partially fused, likely congenital in nature. Underlying bone marrow signal intensity within normal limits. No discrete or worrisome osseous lesions. Discogenic reactive endplate change present about the C5-6 and C6-7 interspaces. No other abnormal marrow edema or enhancement. Cord: Suspected patchy cord signal abnormality at the level of C5-6, suggesting compressive myelopathy. Additional possible patchy signal abnormality at the level of C6-7 as well. Signal intensity within the cervical spinal cord is otherwise within normal limits. Posterior Fossa, vertebral arteries, paraspinal tissues: Visualized brain and posterior fossa within normal limits. Craniocervical junction normal. Paraspinous and prevertebral soft tissues within normal limits. Normal flow voids seen within the vertebral arteries bilaterally. Disc levels: C2-C3: Negative interspace. Bilateral facet hypertrophy. No canal or foraminal stenosis. C3-C4: Degenerative intervertebral disc space narrowing with diffuse disc bulge and  bilateral uncovertebral spurring. Left greater than right facet degeneration. No spinal stenosis. Severe left with moderate right C4 foraminal narrowing. C4-C5: Mild disc bulge with uncovertebral and endplate spurring. Posterior disc osteophyte flattens and partially  effaces the ventral thecal sac with no more than mild spinal stenosis. No cord impingement. Superimposed left-sided facet degeneration. Severe bilateral C5 foraminal stenosis. C5-C6: Degenerative intervertebral disc space narrowing with diffuse disc osteophyte complex. Broad posterior component flattens and effaces the ventral thecal sac. Secondary severe spinal stenosis with compression of the cervical spinal cord and probable cord signal changes. Thecal sac narrowed to approximately 2 mm in AP diameter at its most narrow point (series 7, image 25). Severe bilateral C6 foraminal narrowing. C6-C7: Degenerative intervertebral disc space narrowing with diffuse disc osteophyte complex. Broad posterior component flattens and effaces the ventral thecal sac, asymmetric to the right. Resultant severe spinal stenosis with cord flattening. Possible subtle patchy cord signal changes at this level, not entirely certain on this motion degraded exam. Thecal sac measures 5 mm in AP diameter at its most narrow point. Severe right worse than left C7 foraminal stenosis. C7-T1: Degenerative intervertebral disc space narrowing with mild disc bulge and uncovertebral spurring. No significant spinal stenosis. Foramina appear patent. Visualized upper thoracic spine demonstrates no other significant finding. IMPRESSION: 1. 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 Suspected patchy cord signal abnormality at C5-6 and possibly C6-7, suggesting myelopathy. 2. No other acute abnormality within the cervical spine. 3. Additional multifactorial degenerative changes with resultant severe bilateral C4 through C7 foraminal stenosis as above. Findings communicated to the covering physician for this patient Dr. Tonie Griffith at 4:30 a.m. on 10/17/2020. Electronically Signed   By: Jeannine Boga M.D.   On: 10/17/2020 04:45    ROS Blood pressure 101/62, pulse 80,  temperature (!) 97.5 F (36.4 C), temperature source Oral, resp. rate 16, height '5\' 3"'  (1.6 m), weight 75 kg, SpO2 95 %. Physical Exam  Orthopaedic Exam: cervical spine ROM is limited in flexion and extension by nearly 40%, she describes pain that is posterior cervical with radiation into both Arms and hands. She is weak in biceps bilaterally 4/5, triceps bilaterally 4/5, wrist dorsiflexion and volar flexion strength 4/5 bilaterally, finger extension strength is weak bilaterally 3/5. There is swelling in both hands at the MCP joints index and long fingers greater than ulnar digits. She demonstrates EDC Tendon subluxation of ring fingers causing inability to extend the ring fingers when the EDC is subluxed with finger flexion. History of left wrist partial fusion By Dr. Tamera Punt, [facility], there is hyperreflexia both biceps and triceps bilaterally with pathologic spread into the hands with biceps reflex. Hoffmans Sign is not present. No clonus either leg, knee and ankle reflexes are diminished. Motor in both legs is intact, gait is ataxic, requires walker.   Assessment/Plan: Cervical stenosis C5-6 and C6-7 with myelopathy Lumbar spinal stenosis L4-5 Hand swelling at MCP joints with tendon laxity and subluxation previous wrist partial fusion, thumb MC-C arthrosis CAD Diabetes type 2, diet controlled Bilateral Total Knee replacements, post left TKR 05/2020, right 2008 with revision 2013,  Plan: C6 corpectomy with decompression of C5-6 and C6-7, allograft strut Graft with tricortical iliac crest, and fixation with anterior cervical plate and screws. Intraop spinal cord neuromonitoring with SEP and MEP. RF, ANA, Uric acid, sed rate and CRP to assess for inflamatory joint disease. Hand surgery consultation for treatment of EDC subluxation that Appears to  be chronic. Stop anticoagulants tomorrow for surgery on Friday.     Basil Dess 10/18/2020, 10:11 PM

## 2020-10-18 NOTE — Hospital Course (Addendum)
65 year old woman PMH including diabetes presented after multiple falls at home and right leg weakness. MRI of the lumbar spine showed moderate canal stenosis and severe bilateral foraminal stenosis but was per neurosurgery thought to be noncontributory.  She was seen by orthopedics and found to have hand numbness concerning for cervical myelopathy which was confirmed by MRI showing cord compression.  A & P  Severe spinal stenosis and cord compression C5-C6 and C6-7, uggesting myelopathysevere bilateral foraminal stenosis C4-C7. s with history of multiple falls at home, hand numbness and leg weakness.   --Plan for discectomy per orthopedic surgery Friday, transfer to [facility] as per request of surgeon.  Low back pain secondary to moderate canal stenosis and severe bilateral foraminal stenosis L4-5, L5-S1 --Thought to be noncontributory surgery.  Defer management to surgery.  F/u as an outpatient with surgery  Polypharmacy including Per chart Percocet, Zoloft, Zanaflex, Ambien, OxyContin, Skelaxin, buprenorphine patch, Abilify --Recommend consultation as an outpatient for weaning off medications as able.  DM type 2, diabetic peripheral neuropathy, Hgb A1c 8.2 --CBG stable --Continue current management including Levemir, Neurontin.  Can resume oral medications on discharge.  Coronary artery disease --Appears to be asymptomatic.  Unclear why troponins were ordered on admission, trivial elevation, no further evaluation suggested.  EKG sinus rhythm, trigeminy, no acute changes.  No recent cardiac symptoms.  Recommend proceeding with surgery.

## 2020-10-18 NOTE — Plan of Care (Signed)

## 2020-10-18 NOTE — Progress Notes (Signed)
Received patient awake,alert/orientedx4 and able to verbalize needs. NAD noted; respirations easy/even on room air/ Movement/sensation to all extremities noted. Whiteboard updated. All safety measures in place and personal belongings within reach.

## 2020-10-19 DIAGNOSIS — E119 Type 2 diabetes mellitus without complications: Secondary | ICD-10-CM | POA: Diagnosis not present

## 2020-10-19 DIAGNOSIS — W19XXXA Unspecified fall, initial encounter: Secondary | ICD-10-CM | POA: Diagnosis not present

## 2020-10-19 DIAGNOSIS — I2510 Atherosclerotic heart disease of native coronary artery without angina pectoris: Secondary | ICD-10-CM | POA: Diagnosis not present

## 2020-10-19 DIAGNOSIS — G9520 Unspecified cord compression: Secondary | ICD-10-CM | POA: Diagnosis not present

## 2020-10-19 LAB — SURGICAL PCR SCREEN
MRSA, PCR: NEGATIVE
Staphylococcus aureus: NEGATIVE

## 2020-10-19 LAB — GLUCOSE, CAPILLARY
Glucose-Capillary: 194 mg/dL — ABNORMAL HIGH (ref 70–99)
Glucose-Capillary: 188 mg/dL — ABNORMAL HIGH (ref 70–99)
Glucose-Capillary: 228 mg/dL — ABNORMAL HIGH (ref 70–99)
Glucose-Capillary: 103 mg/dL — ABNORMAL HIGH (ref 70–99)

## 2020-10-19 LAB — C-REACTIVE PROTEIN: CRP: 0.7 mg/dL (ref <1.0)

## 2020-10-19 LAB — URIC ACID: Uric Acid, Serum: 3.7 mg/dL (ref 2.5–7.1)

## 2020-10-19 MED ORDER — CEFAZOLIN SODIUM-DEXTROSE 2-4 GM/100ML-% IV SOLN
2.0000 g | INTRAVENOUS | Status: AC
  Administered 2020-10-20: 2 g via INTRAVENOUS
  Filled 2020-10-19 (×3): qty 100

## 2020-10-19 MED ORDER — SODIUM CHLORIDE 0.9 % IV SOLN: INTRAVENOUS | Status: DC

## 2020-10-19 NOTE — Progress Notes (Signed)
Physical Therapy Treatment Patient Details Name: [name] MRN: [id number] DOB: 12-Feb-1956 Today's Date: 10/19/2020    History of Present Illness 65 year old female with history of bilateral knee replacement, left knee replaced January 2022, anxiety disorder, CAD, diabetes, depression, asthma, kidney stones with history of ureteral stents admitted with multiple falls at home.  She thinks the last time she walked normal was in April.  She reports her legs/knees gave way when she tries to walk.  She does not have any complaints of knee pain.  At baseline she uses a walker or a cane.    PT Comments    Pt supine in bed on arrival.  Pt very eager to mobilize this session.  Used sara stedy to move into standing and transfer OOB to recliner.  Continue to recommend rehab in a post acute setting.  Plan is for spinal surgery tomorrow.      Follow Up Recommendations  SNF     Equipment Recommendations  None recommended by PT    Recommendations for Other Services       Precautions / Restrictions Precautions Precautions: Fall Restrictions Weight Bearing Restrictions: No    Mobility  Bed Mobility Overal bed mobility: Needs Assistance Bed Mobility: Rolling;Sidelying to Sit Rolling: Min assist Sidelying to sit: Mod assist       General bed mobility comments: assistance to elevate trunk into sitting.    Transfers Overall transfer level: Needs assistance Equipment used: Ambulation equipment used (used sara stedy) Transfers: Sit to/from Stand Sit to Stand: Mod assist         General transfer comment: Used stedy to block B knees and move into standing.  Cues for hand placement and assistance to boost into standing.  Ambulation/Gait Ambulation/Gait assistance:  (NT)               Stairs             Wheelchair Mobility    Modified Rankin (Stroke Patients Only)       Balance Overall balance assessment: Needs assistance Sitting-balance support: No upper  extremity supported;Feet supported Sitting balance-Leahy Scale: Fair       Standing balance-Leahy Scale: Poor                              Cognition Arousal/Alertness: Awake/alert Behavior During Therapy: WFL for tasks assessed/performed Overall Cognitive Status: Within Functional Limits for tasks assessed                                        Exercises      General Comments        Pertinent Vitals/Pain Pain Assessment: 0-10 Pain Score: 4  Pain Location: L knee. Pain Descriptors / Indicators: Tightness Pain Intervention(s): Monitored during session;Repositioned    Home Living                      Prior Function            PT Goals (current goals can now be found in the care plan section) Acute Rehab PT Goals Patient Stated Goal: maybe go to rehab Potential to Achieve Goals: Good Progress towards PT goals: Progressing toward goals    Frequency    Min 3X/week      PT Plan Current plan remains appropriate    Co-evaluation  AM-PAC PT "6 Clicks" Mobility   Outcome Measure  Help needed turning from your back to your side while in a flat bed without using bedrails?: A Little Help needed moving from lying on your back to sitting on the side of a flat bed without using bedrails?: A Little Help needed moving to and from a bed to a chair (including a wheelchair)?: A Lot Help needed standing up from a chair using your arms (e.g., wheelchair or bedside chair)?: A Lot Help needed to walk in hospital room?: Total Help needed climbing 3-5 steps with a railing? : Total 6 Click Score: 12    End of Session Equipment Utilized During Treatment: Gait belt Activity Tolerance: Patient limited by fatigue Patient left: in chair;with call bell/phone within reach;with chair alarm set Nurse Communication: Mobility status PT Visit Diagnosis: Other abnormalities of gait and mobility (R26.89);Muscle weakness (generalized)  (M62.81)     Time: 9528-4132 PT Time Calculation (min) (ACUTE ONLY): 14 min  Charges:  $Therapeutic Activity: 8-22 mins                     Bonney Leitz , PTA Acute Rehabilitation Services Pager [phone] Office [phone]     Veron Senner Artis Delay 10/19/2020, 4:27 PM

## 2020-10-19 NOTE — Plan of Care (Signed)
  Problem: Clinical Measurements: Goal: Will remain free from infection Outcome: Progressing   Problem: Activity: Goal: Risk for activity intolerance will decrease Outcome: Progressing   

## 2020-10-19 NOTE — Progress Notes (Signed)
PROGRESS NOTE  [name]:[id number] DOB: 01-Jun-1955 DOA: 10/14/2020 PCP: Courtney Paris, NP   LOS: 5 days   Brief Narrative / Interim history: 65 year old woman with diabetes, CAD, OA, comes into the hospital with multiple falls at home, right leg weakness.  She is also been complaining of decreased sensation in her hands.  Lumbar spine MRI showed multilevel lumbar spondylosis L1-2 and L4-5, moderate canal stenosis and severe bilateral foraminal stenosis L4-5 L5-S1.  C-spine MRI was done due to hand involvement which showed severe spinal stenosis and cord compression at C5-6 level possibly C6-7.  Orthopedic surgery consulted  Subjective / 24h Interval events: She is doing well today, denies any significant pain.  Still weak in her legs and has numbness in her hands  Assessment & Plan: Principal Problem Severe spinal stenosis and cord compression C5-6, C6-7 -orthopedic surgery consulted, she has been transferred from [facility] to [facility] with plan for surgical approach on Friday 5/27.  Active Problems Low back pain-chronic, moderate canal stenosis and severe bilateral foraminal stenosis L4-5, L5-S1.  Conservative management for now  Polypharmacy-recommend further addressed as an outpatient  CAD-asymptomatic  DM 2, poorly controlled with hyperglycemia-A1c 8.2.  Continue Levemir, sliding scale  CBG (last 3)  Recent Labs    10/18/20 1712 10/18/20 2125 10/19/20 0634  GLUCAP 153* 263* 194*   Scheduled Meds: . amitriptyline  10 mg Oral QHS  . ARIPiprazole  5 mg Oral Daily  . aspirin EC  81 mg Oral Daily  . enoxaparin (LOVENOX) injection  40 mg Subcutaneous Q24H  . fenofibrate  54 mg Oral Daily  . gabapentin  800 mg Oral TID  . insulin aspart  0-15 Units Subcutaneous TID WC  . insulin aspart  0-5 Units Subcutaneous QHS  . insulin detemir  24 Units Subcutaneous QHS  . magnesium gluconate  500 mg Oral QHS  . oxyCODONE  10 mg Oral Q12H  . pravastatin  80 mg Oral  Daily  . sertraline  100 mg Oral QHS  . vitamin B-12  1,000 mcg Oral Daily   Continuous Infusions: PRN Meds:.acetaminophen, albuterol, albuterol, etodolac, metaxalone, ondansetron **OR** ondansetron (ZOFRAN) IV, oxyCODONE-acetaminophen **AND** oxyCODONE, tiZANidine  Diet Orders (From admission, onward)    Start     Ordered   10/15/20 0028  Diet Carb Modified Fluid consistency: Thin; Room service appropriate? Yes  Diet effective now       Question Answer Comment  Diet-HS Snack? Nothing   Calorie Level Medium 1600-2000   Fluid consistency: Thin   Room service appropriate? Yes      10/15/20 0027          DVT prophylaxis: enoxaparin (LOVENOX) injection 40 mg Start: 10/15/20 1000     Code Status: Full Code  Family Communication: No family at bedside  Status is: Inpatient  Remains inpatient appropriate because:Inpatient level of care appropriate due to severity of illness   Dispo: The patient is from: Home              Anticipated d/c is to: TBD              Patient currently is not medically stable to d/c.   Difficult to place patient No   Level of care: Med-Surg  Consultants:  Orthopedic surgery  Procedures:  None  Microbiology  None  Antimicrobials: None   Objective: Vitals:   10/18/20 1710 10/18/20 2009 10/19/20 0345 10/19/20 0805  BP: 114/78 101/62 (!) 98/54 (!) 101/54  Pulse: 80 80  89 95  Resp: 17 16 16 14   Temp: 98.5 F (36.9 C) (!) 97.5 F (36.4 C) 98.3 F (36.8 C) 97.6 F (36.4 C)  TempSrc: Oral Oral Oral Oral  SpO2: 95% 95% 94% 94%  Weight:      Height:        Intake/Output Summary (Last 24 hours) at 10/19/2020 1103 Last data filed at 10/18/2020 1259 Gross per 24 hour  Intake 220 ml  Output 200 ml  Net 20 ml   Filed Weights   10/14/20 1513  Weight: 75 kg    Examination:  Constitutional: NAD Eyes: no scleral icterus ENMT: Mucous membranes are moist.  Neck: normal, supple Respiratory: clear to auscultation bilaterally, no  wheezing, no crackles. Normal respiratory effort.  Cardiovascular: Regular rate and rhythm, no murmurs / rubs / gallops. No LE edema. Good peripheral pulses Abdomen: non distended, no tenderness. Bowel sounds positive.  Musculoskeletal: no clubbing / cyanosis.  Skin: no rashes   Data Reviewed: I have independently reviewed following labs and imaging studies   CBC: Recent Labs  Lab 10/14/20 1553 10/15/20 0319  WBC 7.1 6.8  NEUTROABS 5.0  --   HGB 11.5* 11.1*  HCT 37.2 36.6  MCV 86.1 86.7  PLT 219 209   Basic Metabolic Panel: Recent Labs  Lab 10/14/20 1553 10/15/20 0319 10/15/20 1401 10/16/20 0241  NA 144 141  --  141  K 3.7 3.2*  --  4.7  CL 106 103  --  106  CO2 32 30  --  30  GLUCOSE 267* 187*  --  145*  BUN 17 12  --  17  CREATININE 0.65 0.52  --  0.79  CALCIUM 9.7 9.3  --  9.3  MG 1.6*  --  1.7 1.8   Liver Function Tests: Recent Labs  Lab 10/14/20 1553 10/15/20 0319  AST 20 19  ALT 19 20  ALKPHOS 121 106  BILITOT 0.3 0.5  PROT 7.2 6.5  ALBUMIN 3.7 3.4*   Coagulation Profile: No results for input(s): INR, PROTIME in the last 168 hours. HbA1C: No results for input(s): HGBA1C in the last 72 hours. CBG: Recent Labs  Lab 10/18/20 0733 10/18/20 1223 10/18/20 1712 10/18/20 2125 10/19/20 0634  GLUCAP 187* 182* 153* 263* 194*    Recent Results (from the past 240 hour(s))  SARS CORONAVIRUS 2 (TAT 6-24 HRS) Nasopharyngeal Nasopharyngeal Swab     Status: None   Collection Time: 10/14/20  9:35 PM   Specimen: Nasopharyngeal Swab  Result Value Ref Range Status   SARS Coronavirus 2 NEGATIVE NEGATIVE Final    Comment: (NOTE) SARS-CoV-2 target nucleic acids are NOT DETECTED.  The SARS-CoV-2 RNA is generally detectable in upper and lower respiratory specimens during the acute phase of infection. Negative results do not preclude SARS-CoV-2 infection, do not rule out co-infections with other pathogens, and should not be used as the sole basis for treatment  or other patient management decisions. Negative results must be combined with clinical observations, patient history, and epidemiological information. The expected result is Negative.  Fact Sheet for Patients: 10/16/20  Fact Sheet for Healthcare Providers: HairSlick.no  This test is not yet approved or cleared by the quierodirigir.com FDA and  has been authorized for detection and/or diagnosis of SARS-CoV-2 by FDA under an Emergency Use Authorization (EUA). This EUA will remain  in effect (meaning this test can be used) for the duration of the COVID-19 declaration under Se ction 564(b)(1) of the Act, 21 U.S.C. section 360bbb-3(b)(1),  unless the authorization is terminated or revoked sooner.  Performed at [facility], [address], [place], [place] [address]      Radiology Studies: No results found.   Pamella Pert, MD, PhD [facility]  Between 7 am - 7 pm I am available, please contact me via Amion (for emergencies) or Securechat (non urgent messages)  Between 7 pm - 7 am I am not available, please contact night coverage MD/APP via Amion

## 2020-10-19 NOTE — H&P (View-Only) (Signed)
     Subjective:   Procedure(s) (LRB): C5-6, C6-7 ANTERIOR CERVICAL DISCECTOMY, C6 CORPECTOMY, C5-C7 PLATE, ALLOGRAFT (N/A) Awake, alert and oriented x 4. Able to ambulate few steps.  Patient reports pain as moderate.    Objective:   VITALS:  Temp:  [97.6 F (36.4 C)-98.3 F (36.8 C)] 98.2 F (36.8 C) (05/26 1929) Pulse Rate:  [51-99] 51 (05/26 1929) Resp:  [14-16] 16 (05/26 1929) BP: (98-126)/(48-57) 126/57 (05/26 1929) SpO2:  [94 %-96 %] 96 % (05/26 1929)  ABD soft Neurovascular intact Intact pulses distally Dorsiflexion/Plantar flexion intact Compartment soft Weak diffusely Biceps, triceps, wrist DF and Vf bilaterally   LABS No results for input(s): HGB, WBC, PLT in the last 72 hours. No results for input(s): NA, K, CL, CO2, BUN, CREATININE, GLUCOSE in the last 72 hours. No results for input(s): LABPT, INR in the last 72 hours.   Assessment/Plan:   Procedure(s) (LRB): C5-6, C6-7 ANTERIOR CERVICAL DISCECTOMY, C6 CORPECTOMY, C5-C7 PLATE, ALLOGRAFT (N/A)  Advance diet  NPO past 5AM for surgery at 12:30 PM Friday. ERAS protocal shifted by 5:30 hours.  [name] 10/19/2020, 9:18 PM Patient ID: [name], female   DOB: 1956-05-19, 65 y.o.   MRN: [id number]

## 2020-10-19 NOTE — Progress Notes (Signed)
     Subjective:   Procedure(s) (LRB): C5-6, C6-7 ANTERIOR CERVICAL DISCECTOMY, C6 CORPECTOMY, C5-C7 PLATE, ALLOGRAFT (N/A) Awake, alert and oriented x 4. Able to ambulate few steps.  Patient reports pain as moderate.    Objective:   VITALS:  Temp:  [97.6 F (36.4 C)-98.3 F (36.8 C)] 98.2 F (36.8 C) (05/26 1929) Pulse Rate:  [51-99] 51 (05/26 1929) Resp:  [14-16] 16 (05/26 1929) BP: (98-126)/(48-57) 126/57 (05/26 1929) SpO2:  [94 %-96 %] 96 % (05/26 1929)  ABD soft Neurovascular intact Intact pulses distally Dorsiflexion/Plantar flexion intact Compartment soft Weak diffusely Biceps, triceps, wrist DF and Vf bilaterally   LABS No results for input(s): HGB, WBC, PLT in the last 72 hours. No results for input(s): NA, K, CL, CO2, BUN, CREATININE, GLUCOSE in the last 72 hours. No results for input(s): LABPT, INR in the last 72 hours.   Assessment/Plan:   Procedure(s) (LRB): C5-6, C6-7 ANTERIOR CERVICAL DISCECTOMY, C6 CORPECTOMY, C5-C7 PLATE, ALLOGRAFT (N/A)  Advance diet  NPO past 5AM for surgery at 12:30 PM Friday. ERAS protocal shifted by 5:30 hours.  [name] 10/19/2020, 9:18 PM Patient ID: [name], female   DOB: 1956-05-19, 65 y.o.   MRN: [id number]

## 2020-10-20 ENCOUNTER — Encounter (HOSPITAL_COMMUNITY): Payer: Self-pay | Admitting: Internal Medicine

## 2020-10-20 ENCOUNTER — Inpatient Hospital Stay (HOSPITAL_COMMUNITY): Payer: Medicare Other | Admitting: Certified Registered Nurse Anesthetist

## 2020-10-20 ENCOUNTER — Inpatient Hospital Stay (HOSPITAL_COMMUNITY): Payer: Medicare Other

## 2020-10-20 ENCOUNTER — Inpatient Hospital Stay (HOSPITAL_COMMUNITY)
Admission: EM | Disposition: A | Discharge status: Home Health | Payer: Self-pay | Source: Home / Self Care | Attending: Internal Medicine

## 2020-10-20 DIAGNOSIS — W19XXXA Unspecified fall, initial encounter: Secondary | ICD-10-CM | POA: Diagnosis not present

## 2020-10-20 DIAGNOSIS — M4712 Other spondylosis with myelopathy, cervical region: Secondary | ICD-10-CM

## 2020-10-20 DIAGNOSIS — I2510 Atherosclerotic heart disease of native coronary artery without angina pectoris: Secondary | ICD-10-CM | POA: Diagnosis not present

## 2020-10-20 DIAGNOSIS — G9520 Unspecified cord compression: Secondary | ICD-10-CM | POA: Diagnosis not present

## 2020-10-20 DIAGNOSIS — E119 Type 2 diabetes mellitus without complications: Secondary | ICD-10-CM | POA: Diagnosis not present

## 2020-10-20 HISTORY — PX: ANTERIOR CERVICAL DECOMP/DISCECTOMY FUSION: SHX1161

## 2020-10-20 LAB — GLUCOSE, CAPILLARY
Glucose-Capillary: 301 mg/dL — ABNORMAL HIGH (ref 70–99)
Glucose-Capillary: 64 mg/dL — ABNORMAL LOW (ref 70–99)
Glucose-Capillary: 72 mg/dL (ref 70–99)
Glucose-Capillary: 138 mg/dL — ABNORMAL HIGH (ref 70–99)
Glucose-Capillary: 93 mg/dL (ref 70–99)
Glucose-Capillary: 105 mg/dL — ABNORMAL HIGH (ref 70–99)
Glucose-Capillary: 134 mg/dL — ABNORMAL HIGH (ref 70–99)
Glucose-Capillary: 181 mg/dL — ABNORMAL HIGH (ref 70–99)
Glucose-Capillary: 327 mg/dL — ABNORMAL HIGH (ref 70–99)

## 2020-10-20 LAB — BASIC METABOLIC PANEL
Anion gap: 9 (ref 5–15)
BUN: 27 mg/dL — ABNORMAL HIGH (ref 8–23)
CO2: 33 mmol/L — ABNORMAL HIGH (ref 22–32)
Calcium: 10.9 mg/dL — ABNORMAL HIGH (ref 8.9–10.3)
Chloride: 96 mmol/L — ABNORMAL LOW (ref 98–111)
Creatinine, Ser: 0.99 mg/dL (ref 0.44–1.00)
GFR, Estimated: >60 mL/min (ref >60)
Glucose, Bld: 135 mg/dL — ABNORMAL HIGH (ref 70–99)
Potassium: 4.6 mmol/L (ref 3.5–5.1)
Sodium: 138 mmol/L (ref 135–145)

## 2020-10-20 LAB — CBC
HCT: 43.4 % (ref 36.0–46.0)
Hemoglobin: 13.6 g/dL (ref 12.0–15.0)
MCH: 26.8 pg (ref 26.0–34.0)
MCHC: 31.3 g/dL (ref 30.0–36.0)
MCV: 85.4 fL (ref 80.0–100.0)
Platelets: 296 10*3/uL (ref 150–400)
RBC: 5.08 MIL/uL (ref 3.87–5.11)
RDW: 15.8 % — ABNORMAL HIGH (ref 11.5–15.5)
WBC: 11.9 10*3/uL — ABNORMAL HIGH (ref 4.0–10.5)
nRBC: 0 % (ref 0.0–0.2)

## 2020-10-20 LAB — ANA W/REFLEX IF POSITIVE: Anti Nuclear Antibody (ANA): NEGATIVE

## 2020-10-20 LAB — TYPE AND SCREEN
ABO/RH(D): B POS
Antibody Screen: NEGATIVE

## 2020-10-20 LAB — RHEUMATOID FACTOR: Rheumatoid fact SerPl-aCnc: 11.2 IU/mL (ref <14.0)

## 2020-10-20 SURGERY — ANTERIOR CERVICAL DECOMPRESSION/DISCECTOMY FUSION 2 LEVELS
Anesthesia: General | Site: Spine Cervical

## 2020-10-20 MED ORDER — BUPIVACAINE LIPOSOME 1.3 % IJ SUSP
INTRAMUSCULAR | Status: AC
  Filled 2020-10-20: qty 20

## 2020-10-20 MED ORDER — SODIUM CHLORIDE 0.9% IV SOLUTION: Freq: Once | INTRAVENOUS | Status: DC

## 2020-10-20 MED ORDER — BUPIVACAINE LIPOSOME 1.3 % IJ SUSP
INTRAMUSCULAR | Status: DC | PRN
  Administered 2020-10-20: 5 mL

## 2020-10-20 MED ORDER — LIDOCAINE 2% (20 MG/ML) 5 ML SYRINGE
INTRAMUSCULAR | Status: DC | PRN
  Administered 2020-10-20: 60 mg via INTRAVENOUS

## 2020-10-20 MED ORDER — ONDANSETRON HCL 4 MG/2ML IJ SOLN
INTRAMUSCULAR | Status: AC
  Filled 2020-10-20: qty 2

## 2020-10-20 MED ORDER — FENTANYL CITRATE (PF) 100 MCG/2ML IJ SOLN
INTRAMUSCULAR | Status: AC
  Filled 2020-10-20: qty 2

## 2020-10-20 MED ORDER — ONDANSETRON HCL 4 MG/2ML IJ SOLN
INTRAMUSCULAR | Status: DC | PRN
  Administered 2020-10-20: 4 mg via INTRAVENOUS

## 2020-10-20 MED ORDER — BUPIVACAINE HCL (PF) 0.25 % IJ SOLN
INTRAMUSCULAR | Status: AC
  Filled 2020-10-20: qty 30

## 2020-10-20 MED ORDER — DEXAMETHASONE SODIUM PHOSPHATE 10 MG/ML IJ SOLN
INTRAMUSCULAR | Status: DC | PRN
  Administered 2020-10-20: 10 mg via INTRAVENOUS

## 2020-10-20 MED ORDER — DEXAMETHASONE SODIUM PHOSPHATE 10 MG/ML IJ SOLN
INTRAMUSCULAR | Status: AC
  Filled 2020-10-20: qty 1

## 2020-10-20 MED ORDER — CEFAZOLIN SODIUM-DEXTROSE 2-4 GM/100ML-% IV SOLN: 2.0000 g | INTRAVENOUS | Status: DC

## 2020-10-20 MED ORDER — ENSURE PRE-SURGERY PO LIQD
296.0000 mL | Freq: Once | ORAL | Status: AC
  Administered 2020-10-20: 296 mL via ORAL
  Filled 2020-10-20: qty 296

## 2020-10-20 MED ORDER — ESMOLOL HCL 100 MG/10ML IV SOLN
INTRAVENOUS | Status: DC | PRN
  Administered 2020-10-20: 20 mg via INTRAVENOUS

## 2020-10-20 MED ORDER — ACETAMINOPHEN 10 MG/ML IV SOLN: 1000.0000 mg | Freq: Once | INTRAVENOUS | Status: DC | PRN

## 2020-10-20 MED ORDER — FENTANYL CITRATE (PF) 250 MCG/5ML IJ SOLN
INTRAMUSCULAR | Status: DC | PRN
  Administered 2020-10-20: 100 ug via INTRAVENOUS
  Administered 2020-10-20: 50 ug via INTRAVENOUS

## 2020-10-20 MED ORDER — PHENYLEPHRINE 40 MCG/ML (10ML) SYRINGE FOR IV PUSH (FOR BLOOD PRESSURE SUPPORT)
PREFILLED_SYRINGE | INTRAVENOUS | Status: DC | PRN
  Administered 2020-10-20: 80 ug via INTRAVENOUS

## 2020-10-20 MED ORDER — VASOPRESSIN 20 UNIT/ML IV SOLN
INTRAVENOUS | Status: AC
  Filled 2020-10-20: qty 1

## 2020-10-20 MED ORDER — THROMBIN 20000 UNITS EX KIT
PACK | CUTANEOUS | Status: AC
  Filled 2020-10-20: qty 1

## 2020-10-20 MED ORDER — PHENYLEPHRINE HCL-NACL 10-0.9 MG/250ML-% IV SOLN
INTRAVENOUS | Status: DC | PRN
  Administered 2020-10-20: 50 ug/min via INTRAVENOUS

## 2020-10-20 MED ORDER — FENTANYL CITRATE (PF) 250 MCG/5ML IJ SOLN
INTRAMUSCULAR | Status: AC
  Filled 2020-10-20: qty 5

## 2020-10-20 MED ORDER — 0.9 % SODIUM CHLORIDE (POUR BTL) OPTIME
TOPICAL | Status: DC | PRN
  Administered 2020-10-20: 1000 mL

## 2020-10-20 MED ORDER — DEXTROSE 50 % IV SOLN
25.0000 mL | Freq: Once | INTRAVENOUS | Status: AC
  Filled 2020-10-20: qty 50

## 2020-10-20 MED ORDER — BUPIVACAINE HCL 0.5 % IJ SOLN
INTRAMUSCULAR | Status: DC | PRN
  Administered 2020-10-20: 5 mL

## 2020-10-20 MED ORDER — EPHEDRINE SULFATE-NACL 50-0.9 MG/10ML-% IV SOSY
PREFILLED_SYRINGE | INTRAVENOUS | Status: DC | PRN
  Administered 2020-10-20: 5 mg via INTRAVENOUS
  Administered 2020-10-20: 10 mg via INTRAVENOUS
  Administered 2020-10-20 (×2): 5 mg via INTRAVENOUS

## 2020-10-20 MED ORDER — MAGNESIUM SULFATE IN D5W 1-5 GM/100ML-% IV SOLN
1.0000 g | Freq: Once | INTRAVENOUS | Status: AC
  Administered 2020-10-20: 1 g via INTRAVENOUS
  Filled 2020-10-20: qty 100

## 2020-10-20 MED ORDER — PROMETHAZINE HCL 25 MG/ML IJ SOLN: 6.2500 mg | INTRAMUSCULAR | Status: DC | PRN

## 2020-10-20 MED ORDER — SUCCINYLCHOLINE CHLORIDE 20 MG/ML IJ SOLN
INTRAMUSCULAR | Status: DC | PRN
  Administered 2020-10-20: 100 mg via INTRAVENOUS

## 2020-10-20 MED ORDER — DEXTROSE 50 % IV SOLN
INTRAVENOUS | Status: AC
  Administered 2020-10-20: 25 mL via INTRAVENOUS
  Filled 2020-10-20: qty 50

## 2020-10-20 MED ORDER — BUPIVACAINE LIPOSOME 1.3 % IJ SUSP: 10.0000 mL | Freq: Once | INTRAMUSCULAR | Status: DC

## 2020-10-20 MED ORDER — LIDOCAINE 2% (20 MG/ML) 5 ML SYRINGE
INTRAMUSCULAR | Status: AC
  Filled 2020-10-20: qty 5

## 2020-10-20 MED ORDER — SODIUM CHLORIDE 0.9 % IV SOLN
0.2500 ug/kg/h | INTRAVENOUS | Status: AC
  Administered 2020-10-20: .2 ug/kg/h via INTRAVENOUS
  Filled 2020-10-20 (×2): qty 5

## 2020-10-20 MED ORDER — FENTANYL CITRATE (PF) 100 MCG/2ML IJ SOLN
25.0000 ug | INTRAMUSCULAR | Status: DC | PRN
  Administered 2020-10-20: 25 ug via INTRAVENOUS

## 2020-10-20 MED ORDER — ESMOLOL HCL 100 MG/10ML IV SOLN
INTRAVENOUS | Status: AC
  Filled 2020-10-20: qty 10

## 2020-10-20 MED ORDER — CHLORHEXIDINE GLUCONATE 0.12 % MT SOLN
OROMUCOSAL | Status: AC
  Administered 2020-10-20: 15 mL via OROMUCOSAL
  Filled 2020-10-20: qty 15

## 2020-10-20 MED ORDER — ALBUMIN HUMAN 5 % IV SOLN: INTRAVENOUS | Status: DC | PRN

## 2020-10-20 MED ORDER — CHLORHEXIDINE GLUCONATE 0.12 % MT SOLN
15.0000 mL | OROMUCOSAL | Status: AC
  Filled 2020-10-20: qty 15

## 2020-10-20 MED ORDER — MIDAZOLAM HCL 5 MG/5ML IJ SOLN
INTRAMUSCULAR | Status: DC | PRN
  Administered 2020-10-20: 2 mg via INTRAVENOUS

## 2020-10-20 MED ORDER — THROMBIN 20000 UNITS EX SOLR
CUTANEOUS | Status: DC | PRN
  Administered 2020-10-20: 20 mL via TOPICAL

## 2020-10-20 MED ORDER — PROPOFOL 10 MG/ML IV BOLUS
INTRAVENOUS | Status: AC
  Filled 2020-10-20: qty 20

## 2020-10-20 MED ORDER — LACTATED RINGERS IV SOLN: INTRAVENOUS | Status: DC | PRN

## 2020-10-20 MED ORDER — LACTATED RINGERS IV SOLN: INTRAVENOUS | Status: DC

## 2020-10-20 MED ORDER — PROPOFOL 10 MG/ML IV BOLUS
INTRAVENOUS | Status: DC | PRN
  Administered 2020-10-20: 100 mg via INTRAVENOUS

## 2020-10-20 MED ORDER — PROPOFOL 500 MG/50ML IV EMUL
INTRAVENOUS | Status: DC | PRN
  Administered 2020-10-20: 50 ug/kg/min via INTRAVENOUS

## 2020-10-20 MED ORDER — MIDAZOLAM HCL 2 MG/2ML IJ SOLN
INTRAMUSCULAR | Status: AC
  Filled 2020-10-20: qty 2

## 2020-10-20 MED ORDER — ROCURONIUM BROMIDE 10 MG/ML (PF) SYRINGE
PREFILLED_SYRINGE | INTRAVENOUS | Status: AC
  Filled 2020-10-20: qty 10

## 2020-10-20 SURGICAL SUPPLY — 82 items
ADH SKN CLS APL DERMABOND .7 (GAUZE/BANDAGES/DRESSINGS) ×1
AGENT HMST KT MTR STRL THRMB (HEMOSTASIS) ×2
APL SKNCLS STERI-STRIP NONHPOA (GAUZE/BANDAGES/DRESSINGS) ×1
BENZOIN TINCTURE PRP APPL 2/3 (GAUZE/BANDAGES/DRESSINGS) ×2 IMPLANT
BIT DRILL SRG 14X2.2XFLT CHK (BIT) IMPLANT
BIT DRL SRG 14X2.2XFLT CHK (BIT) ×1
BLADE AVERAGE 25X9 (BLADE) ×1 IMPLANT
BLADE CLIPPER SURG (BLADE) IMPLANT
BUR MATCHSTICK NEURO 3.0 LAGG (BURR) IMPLANT
BUR RND FLUTED 2.5 (BURR) ×1 IMPLANT
BUR SABER RD CUTTING 3.0 (BURR) IMPLANT
COVER SURGICAL LIGHT HANDLE (MISCELLANEOUS) ×2 IMPLANT
COVER WAND RF STERILE (DRAPES) ×2 IMPLANT
DERMABOND ADVANCED (GAUZE/BANDAGES/DRESSINGS) ×1
DERMABOND ADVANCED .7 DNX12 (GAUZE/BANDAGES/DRESSINGS) ×1 IMPLANT
DRAIN TLS ROUND 10FR (DRAIN) IMPLANT
DRAPE C-ARM 42X72 X-RAY (DRAPES) ×2 IMPLANT
DRAPE MICROSCOPE LEICA (MISCELLANEOUS) ×2 IMPLANT
DRAPE POUCH INSTRU U-SHP 10X18 (DRAPES) ×2 IMPLANT
DRAPE SURG 17X23 STRL (DRAPES) ×18 IMPLANT
DRILL BIT SKYLINE 14MM (BIT) ×2
DRSG MEPILEX BORDER 4X4 (GAUZE/BANDAGES/DRESSINGS) IMPLANT
DRSG TEGADERM 4X4.75 (GAUZE/BANDAGES/DRESSINGS) ×1 IMPLANT
DURAPREP 6ML APPLICATOR 50/CS (WOUND CARE) ×2 IMPLANT
ELECT BLADE 4.0 EZ CLEAN MEGAD (MISCELLANEOUS)
ELECT COATED BLADE 2.86 ST (ELECTRODE) ×2 IMPLANT
ELECT REM PT RETURN 9FT ADLT (ELECTROSURGICAL) ×2
ELECTRODE BLDE 4.0 EZ CLN MEGD (MISCELLANEOUS) IMPLANT
ELECTRODE REM PT RTRN 9FT ADLT (ELECTROSURGICAL) ×1 IMPLANT
EVACUATOR 1/8 PVC DRAIN (DRAIN) ×1 IMPLANT
FEE INTRAOP CADWELL SUPPLY NCS (MISCELLANEOUS) IMPLANT
FEE INTRAOP MONITOR IMPULS NCS (MISCELLANEOUS) IMPLANT
GAUZE SPONGE 2X2 8PLY STRL LF (GAUZE/BANDAGES/DRESSINGS) IMPLANT
GLOVE ECLIPSE 9.0 STRL (GLOVE) ×2 IMPLANT
GLOVE ORTHO TXT STRL SZ7.5 (GLOVE) ×2 IMPLANT
GLOVE SRG 8 PF TXTR STRL LF DI (GLOVE) ×1 IMPLANT
GLOVE SURG 8.5 LATEX PF (GLOVE) ×2 IMPLANT
GLOVE SURG UNDER POLY LF SZ8 (GLOVE) ×2
GOWN STRL REUS W/ TWL LRG LVL3 (GOWN DISPOSABLE) ×1 IMPLANT
GOWN STRL REUS W/TWL 2XL LVL3 (GOWN DISPOSABLE) ×4 IMPLANT
GOWN STRL REUS W/TWL LRG LVL3 (GOWN DISPOSABLE) ×2
HALTER HD/CHIN CERV TRACTION D (MISCELLANEOUS) ×2 IMPLANT
INTRAOP CADWELL SUPPLY FEE NCS (MISCELLANEOUS) ×1
INTRAOP DISP SUPPLY FEE NCS (MISCELLANEOUS) ×2
INTRAOP MONITOR FEE IMPULS NCS (MISCELLANEOUS) ×1
INTRAOP MONITOR FEE IMPULSE (MISCELLANEOUS) ×2
KIT BASIN OR (CUSTOM PROCEDURE TRAY) ×2 IMPLANT
KIT TURNOVER KIT B (KITS) ×2 IMPLANT
MANIFOLD NEPTUNE II (INSTRUMENTS) ×2 IMPLANT
NDL SPNL 20GX3.5 QUINCKE YW (NEEDLE) ×2 IMPLANT
NEEDLE SPNL 20GX3.5 QUINCKE YW (NEEDLE) ×4 IMPLANT
NS IRRIG 1000ML POUR BTL (IV SOLUTION) ×2 IMPLANT
PACK ORTHO CERVICAL (CUSTOM PROCEDURE TRAY) ×2 IMPLANT
PAD ARMBOARD 7.5X6 YLW CONV (MISCELLANEOUS) ×4 IMPLANT
PATTIES SURGICAL .5 X.5 (GAUZE/BANDAGES/DRESSINGS) IMPLANT
PIN DISTRACTION 14MM (PIN) ×2 IMPLANT
PLATE SKYLINE 2 LEVEL 40 (Plate) ×1 IMPLANT
PLATE SKYLINE TWO LEVEL 36MM (Plate) ×1 IMPLANT
RESTRAINT LIMB HOLDER UNIV (RESTRAINTS) ×2 IMPLANT
SCREW VAR SELF TAP SKYLINE 14M (Screw) ×4 IMPLANT
SPONGE GAUZE 2X2 STER 10/PKG (GAUZE/BANDAGES/DRESSINGS) ×1
SPONGE INTESTINAL PEANUT (DISPOSABLE) ×1 IMPLANT
SPONGE LAP 4X18 RFD (DISPOSABLE) ×1 IMPLANT
SPONGE SURGIFOAM ABS GEL 100 (HEMOSTASIS) ×1 IMPLANT
STRIP CLOSURE SKIN 1/2X4 (GAUZE/BANDAGES/DRESSINGS) ×2 IMPLANT
STRIP CLOSURE SKIN 1/4X4 (GAUZE/BANDAGES/DRESSINGS) ×1 IMPLANT
SURGIFLO W/THROMBIN 8M KIT (HEMOSTASIS) ×2 IMPLANT
SUT ETHILON 2 0 FS 18 (SUTURE) ×1 IMPLANT
SUT ETHILON 4 0 PS 2 18 (SUTURE) ×2 IMPLANT
SUT VIC AB 2-0 CT1 27 (SUTURE) ×4
SUT VIC AB 2-0 CT1 TAPERPNT 27 (SUTURE) ×1 IMPLANT
SUT VIC AB 3-0 X1 27 (SUTURE) ×3 IMPLANT
SUT VICRYL 4-0 PS2 18IN ABS (SUTURE) IMPLANT
SYR 20ML LL LF (SYRINGE) ×2 IMPLANT
SYR 30ML LL (SYRINGE) ×2 IMPLANT
SYSTEM CHEST DRAIN TLS 7FR (DRAIN) IMPLANT
TISSUE ILIAC TRICORT 2.2X45 (Bone Implant) ×1 IMPLANT
TOWEL GREEN STERILE (TOWEL DISPOSABLE) ×2 IMPLANT
TOWEL GREEN STERILE FF (TOWEL DISPOSABLE) ×2 IMPLANT
TRAY FOLEY MTR SLVR 16FR STAT (SET/KITS/TRAYS/PACK) ×2 IMPLANT
TRAY FOLEY W/BAG SLVR 14FR (SET/KITS/TRAYS/PACK) ×1 IMPLANT
WATER STERILE IRR 1000ML POUR (IV SOLUTION) ×2 IMPLANT

## 2020-10-20 NOTE — Progress Notes (Signed)
0645 Pt heart rate goes down to 29 and rises up to 90. RN  Monitored for 5 minutes and it goes up and down.Pt denies any symptoms. AJulious Oka paged. Awaiting reponse.

## 2020-10-20 NOTE — Discharge Instructions (Signed)
No lifting greater than 10 lbs. No overhead use of arms. °Avoid bending,and twisting neck. °Walk in house for first week them may start to get out slowly increasing distance up to one mile by 3 weeks post op. °Keep incision dry for 3 days, may then bathe and wet incision using a [place] collar when showering. °Call if any fevers >101, chills, or increasing numbness or weakness or increased swelling or drainage. ° °

## 2020-10-20 NOTE — Brief Op Note (Signed)
10/20/2020  7:07 PM  PATIENT:  [name]  65 y.o. female  PRE-OPERATIVE DIAGNOSIS:  C5-6, C6-7 stenosis, myelopathy  POST-OPERATIVE DIAGNOSIS:  C5-6, C6-7 stenosis, myelopathy  PROCEDURE:  Procedure(s): C5-6, C6-7 ANTERIOR CERVICAL DISCECTOMY, C6 CORPECTOMY, C5-C7 PLATE, ALLOGRAFT (N/A)  SURGEON:  Surgeon(s) and Role:    * Kerrin Champagne, MD - Primary  PHYSICIAN ASSISTANT: Andee Lineman  ANESTHESIA:   local and general,  Dr. Hester Mates   EBL:  350 mL   BLOOD ADMINISTERED:none  DRAINS: (10 french Hemovac drain sewn in place anterior cervical spine ) Hemovact drain(s) in the anterior cervica spine with  Suction Clamped and Urinary Catheter (Foley)   LOCAL MEDICATIONS USED:  MARCAINE 0.5% 1:1 EXPAREL 1.3%    Amount:61ml  SPECIMEN:  No Specimen  DISPOSITION OF SPECIMEN:  N/A  COUNTS:  YES  TOURNIQUET:  * No tourniquets in log *  DICTATION: .Dragon Dictation  PLAN OF CARE: Admit to inpatient   PATIENT DISPOSITION:  PACU - hemodynamically stable.   Delay start of Pharmacological VTE agent (>24hrs) due to surgical blood loss or risk of bleeding: yes  Intraoperative neural monitoring SEP and MEP with no change during the course of surgical decompression, corpectomy and strut grafting.

## 2020-10-20 NOTE — Anesthesia Procedure Notes (Signed)
Procedure Name: Intubation Date/Time: 10/20/2020 1:40 PM Performed by: Audie Pinto, CRNA Pre-anesthesia Checklist: Patient identified, Emergency Drugs available, Suction available and Patient being monitored Patient Re-evaluated:Patient Re-evaluated prior to induction Oxygen Delivery Method: Circle system utilized Preoxygenation: Pre-oxygenation with 100% oxygen Induction Type: IV induction Ventilation: Mask ventilation without difficulty Laryngoscope Size: Glidescope and 3 Grade View: Grade I Tube type: Oral Tube size: 7.0 mm Number of attempts: 1 Airway Equipment and Method: Stylet and Oral airway Placement Confirmation: ETT inserted through vocal cords under direct vision,  positive ETCO2 and breath sounds checked- equal and bilateral Secured at: 22 cm Tube secured with: Tape Dental Injury: Teeth and Oropharynx as per pre-operative assessment  Comments: Elective glidescope_ACDF

## 2020-10-20 NOTE — Anesthesia Postprocedure Evaluation (Signed)
Anesthesia Post Note  Patient: [name]  Procedure(s) Performed: C5-6, C6-7 ANTERIOR CERVICAL DISCECTOMY, C6 CORPECTOMY, C5-C7 PLATE, ALLOGRAFT (N/A Spine Cervical)     Patient location during evaluation: PACU Anesthesia Type: General Level of consciousness: awake and alert Pain management: pain level controlled Vital Signs Assessment: post-procedure vital signs reviewed and stable Respiratory status: spontaneous breathing, nonlabored ventilation, respiratory function stable and patient connected to nasal cannula oxygen Cardiovascular status: blood pressure returned to baseline and stable Postop Assessment: no apparent nausea or vomiting Anesthetic complications: no   No complications documented.  Last Vitals:  Vitals:   10/20/20 1854 10/20/20 1910  BP: (!) 126/55 106/71  Pulse: 87 86  Resp: 14 14  Temp:    SpO2: 92% 96%    Last Pain:  Vitals:   10/20/20 1910  TempSrc:   PainSc: Asleep                 Beryle Lathe

## 2020-10-20 NOTE — Progress Notes (Signed)
Hypoglycemic Event  CBG: 64 Treatment: D50 25 mL (12.5 gm)  Symptoms: Hungry  Follow-up CBG: Time:1243 CBG Result:132  Possible Reasons for Event: Inadequate meal intake  Comments/MD notified:stoltzfus made aware    Mionna Advincula A Zinedine Ellner

## 2020-10-20 NOTE — Progress Notes (Addendum)
     Subjective: Day of Surgery Procedure(s) (LRB): C5-6, C6-7 ANTERIOR CERVICAL DISCECTOMY, C6 CORPECTOMY, C5-C7 PLATE, ALLOGRAFT (N/A) Preop bilateral UE weakness, ataxia due to severe cervical stenosis.  Objective:   VITALS:  Temp:  [97.7 F (36.5 C)-98.2 F (36.8 C)] 97.7 F (36.5 C) (05/27 1215) Pulse Rate:  [47-99] 47 (05/27 1215) Resp:  [14-18] 18 (05/27 1215) BP: (112-126)/(47-57) 114/47 (05/27 1215) SpO2:  [94 %-96 %] 95 % (05/27 1215) Weight:  [74.4 kg] 74.4 kg (05/27 1215)  Neurologically intact ABD soft Neurovascular intact Sensation intact distally Intact pulses distally Dorsiflexion/Plantar flexion intact    LABS Recent Labs    10/20/20 0034  HGB 13.6  WBC 11.9*  PLT 296   Recent Labs    10/20/20 0034  NA 138  K 4.6  CL 96*  CO2 33*  BUN 27*  CREATININE 0.99  GLUCOSE 135*   No results for input(s): LABPT, INR in the last 72 hours.   Assessment/Plan: Day of Surgery Procedure(s) (LRB): C5-6, C6-7 ANTERIOR CERVICAL DISCECTOMY, C6 CORPECTOMY, C5-C7 PLATE, ALLOGRAFT (N/A)    Preop for C6 corpectomy and decompression of C5-6 and C6-7 for severe spinal stenosis with intraop neural monitoring.   [name] 10/20/2020, 12:47 PMPatient ID: [name], female   DOB: 03-Aug-1955, 65 y.o.   MRN: [id number]

## 2020-10-20 NOTE — Progress Notes (Signed)
Orthopedic Tech Progress Note Patient Details:  [name] 01-Mar-1956 [id number] Left at OR desk for patient Ortho Devices Type of Ortho Device: [place] cervical collar Ortho Device/Splint Interventions: Ordered       Bella Kennedy A Aletheia Tangredi 10/20/2020, 6:09 PM

## 2020-10-20 NOTE — Progress Notes (Signed)
Received  A call from central tele that pt has ventricular bigeminy. Pt seen in room alert/oriented in no apparent distress, denies chest pain/any discomfort at this time. Pt stated :" My heart always skips a beat." Attending MD made aware with no new orders at this time.

## 2020-10-20 NOTE — Progress Notes (Signed)
Orthopedic Tech Progress Note Patient Details:  [name] May 08, 1956 [id number]  Patient ID: [name], female   DOB: [birth year], 65 y.o.   MRN: [id number]   [name] 10/20/2020, 7:50 PM Aspen collar delivered to pts room per [employer]

## 2020-10-20 NOTE — Interval H&P Note (Signed)
History and Physical Interval Note:  10/20/2020 12:47 PM  [name]  has presented today for surgery, with the diagnosis of C5-6, C6-7 stenosis, myelopathy.  The various methods of treatment have been discussed with the patient and family. After consideration of risks, benefits and other options for treatment, the patient has consented to  Procedure(s): C5-6, C6-7 ANTERIOR CERVICAL DISCECTOMY, C6 CORPECTOMY, C5-C7 PLATE, ALLOGRAFT (N/A) as a surgical intervention.  The patient's history has been reviewed, patient examined, no change in status, stable for surgery.  I have reviewed the patient's chart and labs.  Questions were answered to the patient's satisfaction.     [name]

## 2020-10-20 NOTE — Op Note (Signed)
10/20/2020  9:23 PM  PATIENT:  [name]  65 y.o. 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     COMPONENTS:  Implant Name Type Inv. Item Serial No. Manufacturer Lot No. LRB No. Used Action  TISSUE ILIAC TRICORT 2.2X45 - [id number] Bone Implant TISSUE ILIAC TRICORT 2.2X45 [id number] MUSCULOSKELETL TRANSPLANT FNDN   1 Implanted  41m 2 lvl Plate Plate   [employer]   1 Implanted  SCREW VAR SELF TAP SKYLINE 44M - [id number] SCREW VAR SELF TAP SKYLINE 44M  [employer]   4 Implanted  PLATE SKYLINE TWO LEVEL 36MM - [id number] PLATE SKYLINE TWO LEVEL 36MM  [employer]   1 Implanted    DRAINS:   PROCEDURE:The patient was met in the holding area, and the appropriate Left cervical  C5-6 and C6-7 level identified and marked with an "X" and my initials. The patient was then transported to OR and was placed on the operative table in a supine position. The patient was then placed under  general anesthesia without difficulty. The patient received appropriate preoperative antibiotic prophylaxis.  . Nursing staff inserted a Foley catheter under sterile conditions cervical spine was positioned with a Mayfield horseshoe and 5 pound cervical halter traction. All pressure points well padded and semi-beach chair position. Arm holder both arms. Standard prep with DuraPrep solution the anterior cervical spine chest. Intra operative neural monitoring, SEP and MEP. Draped in the usual manner. Iodine  vi drape was used. Standard timeout protocol was carried out identifying the patient procedure side of the procedure and level. The skin the left neck was infiltrated with Marcaine with epinephrine total of 7 cc. This at the level of expected C6 incision and also along the  Lines of Langer. Incision transverse at the C6 level and carried down to the level of the platysma and then medial to sternocleidomastoid muscle. Small superficial veins were suture ligated with 2-0 vicryl. The omohyoid muscle identified and carefully freed up and divided with bovie electrocautery. The interval between the trachea and esophagus medially and the carotid sheath laterally was developed as a Metzenbaum scissors and blunt dissection exposing the anterior aspect of cervical spine at the C5-6 and C6-7 levels.  Attention then turned to the C5-6 level where an 18-gauge spinal needle was placed with sheath intact allowing only a centimeter to extend into the C5-6 disc and observed on lateral C-arm at the C5-6 level. Handheld Cloward retraction of the soft tissues while identifying the level at C5-6 and C6-7 removing a portion of the anterior aspect of the discs with15 blade scalpel and pituitary. Large anterior osteophytes were present so these were resected with Leskell and Bare rongeurs. Additionally a portion of the anterior disc at C6-7 was removed. Medial border of the longus collie muscles was carefully elevated bilaterally and self-retaining retractors were introduced the foot of the blade beneath the medial border of the longus colli muscles. Soft tissue overlying the anterior borders of the disc space at C5-6 and C6-7  level carefully debridement of soft tissue back to bony edges. The anterior lip osteophytes were then resected at  C5-6 and C6-7 using rongeur. This bone was preserved for later bone grafting purposes.Distraction pins placed first at the C7 level and C5 level keeping the screw post at the half of the vertebral body  away from the involved disc spaces.The Boss McCollough retractor placed at the C5-7 level. The foot of the blades medial to the longus colli muscles. A distraction screw post placed at the C7 level and distraction of the C5-C7 performed.The C5-6 disc space was then first prepared [place] with resection of degenerative disc annulus anteriorly and posteriorly and cartilaginous endplates using micro-curettes pituitaries and a high-speed bur.The disc space at C6-7 was similarly prepared with high speed burr and curettes. The operating room microscope was used. Under the operating room microscope and posterior aspect of the disc was excised using micro-curettes pituitary rongeur and times per. Posterior lip osteophytes were drilled using a high-speed bur and a carefully resected with 1 and 2 mm Kerrison foraminotomy performed over both C6 nerve roots using 1 and 2 mm Kerrisons disc and spur material was noted centrally and into the right foramen some of which represented reflected portion of the patient's annulus into the neuroforamen. Additionally there was significant spur into the right side C6 neuroforamen affecting the right C6 nerve. The 3 mm high speed bur was then used to make a central trough in the C6 verterbral body, a rounger was not able to cut through the body of C6. Care was taken to center the OR microscope and the patient's cervical spine and chin during the corpectomy. Following decompression of the spinal cord at C5-6, C6 and C6-7,the C7 nerve roots were decompressed with 1 and 33m kerrisons, irrigation was carried out. Bleeding controlled using some bone wax during the initial part of the central corpectomy at C6 excess bone wax removed and then Gelfoam thrombin-soaked and flow-seal. The endplates of the inferior aspect of C5 and superior aspect of C7 were carefully prepared using a high-speed bur to parallel. The trough space was then sounded utilizing a tranzgraft sounder with measured 14 mm in width and  12 mm in depth. The trough was measured at 163min width and 19 mm in depth.  A portion of sterile paper used to measure the length of the height and width of the expected allograft tricortical iliac bone strut spacer felt to provide best fit for the C5-7 corpectomy space. The allograft tricortical iliac crest implant was then fashioned on the OR field utilizing an oscillating saw and high speed spur the ends on the strut graft carefully rounded and undercut to allow seating into the end plated. The grafIt was then carefully positioned over the C6 no soft tissue within the corpectomy site that could be retropulsed with graft placement. The implant was then carefully placed over the intervertebral disc space at C5-6 level.The graft was then impacted into place with the head placed in longitudinal cervical traction.  The graft was felt to be in excellent position alignment. Distraction pins at the C5 and C7 removed.Bone wax applied to the pin openings.This point irrigation was carried out cervical incision site and lower cervical incision site. Esophagus examined and the upper cervical level as well as at the lower cervical level and found to be normal. Irrigation was again carried out there was no active bleeding present.Longitudinal halter traction was discontinued. The length for cervical plate determined with cottonoid string and two bayonet  forceps. Anterior lip osteophytes were then resected about the anterior C4-5, C5-6 and C6-7 levels using a high-speed bur. This area smoothed for application of the plate to the anterior surface of the cervical spine from C4-C7. A 40 mm plate chosen. The plate carefully applied to the anterior cervical spine and aligned. Two retaining screws removed at the left C7 and C5 levels. Drilling 14 mm hole right side at the C5 level and a 14 mm screw placed. The retaining pin was then removed and a drill hole made on the left side at C5 to 14 mm and a 14 mm screw placed. A second  screw then placed on the right side at the C7 level and on the left at the C7 level.Total of 4 14 mm screws were placed each locked within the plate quite nicely. Intraoperative lateral C-arm of the cervical spine demonstrated th screws at the cranial level to be impinging on the C4-5 disc space the  bone graft was in good position alignment without any significant  canal impingement or retropulsion of graft. With this the plate was removed and the 14 mm screws removed. A plate measuring 36 mm was chosen to allow the superior screws to be replaced into the upper half of the C5 vertebral body. First the screws were replaced into the C7 vertebral body fixing the lower holes of the plate then the superior holes redrilled and positioned into the C5 vertebral body.  C-arm showed repositioning to be in good position and alignment.  Irrigation was carried out at cervical incision site and lower cervical incision site. Esophagus examined and the upper cervical level as well as at the lower cervical level and found to be normal. There was no active bleeding present. Additional bone graft chips were placed through openings in the  anterior cervical plate at the corpectomy level. A 10 French Hemovac drain then placed at the depth of the incision over the anterior cervical spine C4 to C7 this was sewn in place. The omohyoid muscle ends then reapproximated with 2-0 vicryl sutures.The incisions were then closed by approximating the deep subcutaneous layers the platysma layer with interrupted 2-0 Vicryl suture and the superficial fascia overlying the sternocleidomastoid muscle with interrupted 3-0 Vicryl sutures. The subcutaneous layers were approximated with interrupted 3-0 Vicryl sutures as were the superficial layers. The skin was closed with steristrips at  the operative C5-6 transverse incision.Optsite and 2x2 gauze bandage was applied. A Philadelphia collar was then applied to the cervical spine. drapes were removed. Post  surgical neural monitoring studies showed no change in the SEP and MEP of the extremities. Patient's or table was then returned to the flat position. At the end of the cervical spine surgery case all instrument and sponge counts were correct. Patient was then reactivated extubated and returned to recovery room in satisfactory condition.  Benjiman Core, PA-C perform the duties of assistant surgeon performing careful retraction of the esophagus and trachea during the initial exposure and careful suctioning of neural elements cervical nerve roots and cervical cord. He was present from the beginning of case to the end of the case and assisted in positioning of the patient in transfer the patient from bed to the stretcher at the end of the case.   Basil Dess 10/20/2020, 9:23 PM

## 2020-10-20 NOTE — Anesthesia Procedure Notes (Signed)
Arterial Line Insertion Start/End5/27/2022 12:45 PM Performed by: Audie Pinto, CRNA  Patient location: Pre-op. Preanesthetic checklist: patient identified, IV checked and risks and benefits discussed Lidocaine 1% used for infiltration Right, radial was placed Catheter size: 20 G Maximum sterile barriers used  Allen's test indicative of satisfactory collateral circulation Attempts: 2 Procedure performed without using ultrasound guided technique. Following insertion, dressing applied and Biopatch. Post procedure assessment: unchanged  Patient tolerated the procedure well with no immediate complications.

## 2020-10-20 NOTE — Transfer of Care (Signed)
Immediate Anesthesia Transfer of Care Note  Patient: [name]  Procedure(s) Performed: C5-6, C6-7 ANTERIOR CERVICAL DISCECTOMY, C6 CORPECTOMY, C5-C7 PLATE, ALLOGRAFT (N/A Spine Cervical)  Patient Location: PACU  Anesthesia Type:General  Level of Consciousness: awake, alert  and drowsy  Airway & Oxygen Therapy: Patient Spontanous Breathing and Patient connected to face mask oxygen  Post-op Assessment: Report given to RN, Post -op Vital signs reviewed and stable and Patient moving all extremities  Post vital signs: Reviewed and stable  Last Vitals:  Vitals Value Taken Time  BP 144/72 10/20/20 1821  Temp    Pulse 85 10/20/20 1824  Resp 9 10/20/20 1824  SpO2 95 % 10/20/20 1824  Vitals shown include unvalidated device data.  Last Pain:  Vitals:   10/20/20 0800  TempSrc:   PainSc: 0-No pain      Patients Stated Pain Goal: 3 (10/18/20 0849)  Complications: No complications documented.

## 2020-10-20 NOTE — Progress Notes (Signed)
CCMD just called. Patient is having frequent PVC's.  Patient is alert and oriented x 4.  Only complaint is pain and pain meds have been given already.  Will notify on call MD through Amion.  2215 On call MD ordered Magnesium Sulfate IV - one dose.

## 2020-10-20 NOTE — Anesthesia Preprocedure Evaluation (Addendum)
Anesthesia Evaluation  Patient identified by MRN, date of birth, ID band Patient awake    Reviewed: Allergy & Precautions, NPO status , Patient's Chart, lab work & pertinent test results  Airway Mallampati: II  TM Distance: >3 FB Neck ROM: Full    Dental  (+) Teeth Intact   Pulmonary asthma ,  COPD inhaler,    Pulmonary exam normal        Cardiovascular + CAD and + Past MI (2012)  + dysrhythmias  Rhythm:Regular Rate:Normal     Neuro/Psych Anxiety Depression negative neurological ROS     GI/Hepatic Neg liver ROS, hiatal hernia, GERD  ,  Endo/Other  diabetes, Type 2, Oral Hypoglycemic Agents, Insulin Dependent  Renal/GU Renal disease  negative genitourinary   Musculoskeletal  (+) Arthritis , Osteoarthritis,    Abdominal (+)  Abdomen: soft. Bowel sounds: normal.  Peds  Hematology negative hematology ROS (+)   Anesthesia Other Findings   Reproductive/Obstetrics                            Anesthesia Physical Anesthesia Plan  ASA: III  Anesthesia Plan: General   Post-op Pain Management:    Induction: Intravenous  PONV Risk Score and Plan: 3 and Ondansetron, Dexamethasone, Midazolam and Treatment may vary due to age or medical condition  Airway Management Planned: Mask and Oral ETT  Additional Equipment: None  Intra-op Plan:   Post-operative Plan: Extubation in OR  Informed Consent: I have reviewed the patients History and Physical, chart, labs and discussed the procedure including the risks, benefits and alternatives for the proposed anesthesia with the patient or authorized representative who has indicated his/her understanding and acceptance.     Dental advisory given  Plan Discussed with: CRNA  Anesthesia Plan Comments: (Lab Results      Component                Value               Date                      WBC                      11.9 (H)            10/20/2020                 HGB                      13.6                10/20/2020                HCT                      43.4                10/20/2020                MCV                      85.4                10/20/2020                PLT  296                 10/20/2020           Lab Results      Component                Value               Date                      NA                       138                 10/20/2020                K                        4.6                 10/20/2020                CO2                      33 (H)              10/20/2020                GLUCOSE                  135 (H)             10/20/2020                BUN                      27 (H)              10/20/2020                CREATININE               0.99                10/20/2020                CALCIUM                  10.9 (H)            10/20/2020                GFRNONAA                 >60                 10/20/2020                GFRAA                    >60                 11/15/2019          )       Anesthesia Quick Evaluation

## 2020-10-20 NOTE — Progress Notes (Signed)
PROGRESS NOTE  [name] HFW:[id number] DOB: 09/05/1955 DOA: 10/14/2020 PCP: Courtney Paris, NP   LOS: 6 days   Brief Narrative / Interim history: 65 year old woman with diabetes, CAD, OA, comes into the hospital with multiple falls at home, right leg weakness.  She is also been complaining of decreased sensation in her hands.  Lumbar spine MRI showed multilevel lumbar spondylosis L1-2 and L4-5, moderate canal stenosis and severe bilateral foraminal stenosis L4-5 L5-S1.  C-spine MRI was done due to hand involvement which showed severe spinal stenosis and cord compression at C5-6 level possibly C6-7.  Orthopedic surgery consulted  Subjective / 24h Interval events: No complaints this morning  Assessment & Plan: Principal Problem Severe spinal stenosis and cord compression C5-6, C6-7 -orthopedic surgery consulted, she has been transferred from [facility] to [facility] with plan for surgical approach today 5/27  Active Problems Low back pain-chronic, moderate canal stenosis and severe bilateral foraminal stenosis L4-5, L5-S1.  Conservative management for now  Polypharmacy-recommend further addressed as an outpatient  CAD-asymptomatic  Questionable bradycardia-she is not on telemetry apparently this was picked up on the Dinamap but it was very inconsistent.  She is asymptomatic, unlikely of significant clinical importance.  I will place her on cardiac monitoring  DM 2, poorly controlled with hyperglycemia-A1c 8.2.  Continue Levemir, sliding scale  CBG (last 3)  Recent Labs    10/19/20 1641 10/19/20 2127 10/20/20 0627  GLUCAP 228* 103* 301*   Scheduled Meds: . amitriptyline  10 mg Oral QHS  . ARIPiprazole  5 mg Oral Daily  . fenofibrate  54 mg Oral Daily  . gabapentin  800 mg Oral TID  . insulin aspart  0-15 Units Subcutaneous TID WC  . insulin aspart  0-5 Units Subcutaneous QHS  . insulin detemir  24 Units Subcutaneous QHS  . magnesium gluconate  500 mg Oral QHS  .  oxyCODONE  10 mg Oral Q12H  . pravastatin  80 mg Oral Daily  . sertraline  100 mg Oral QHS  . vitamin B-12  1,000 mcg Oral Daily   Continuous Infusions: . sodium chloride 100 mL/hr at 10/20/20 0543  .  ceFAZolin (ANCEF) IV     PRN Meds:.acetaminophen, albuterol, albuterol, etodolac, metaxalone, ondansetron **OR** ondansetron (ZOFRAN) IV, oxyCODONE-acetaminophen **AND** oxyCODONE, tiZANidine  Diet Orders (From admission, onward)    Start     Ordered   10/15/20 0028  Diet Carb Modified Fluid consistency: Thin; Room service appropriate? Yes  Diet effective now       Question Answer Comment  Diet-HS Snack? Nothing   Calorie Level Medium 1600-2000   Fluid consistency: Thin   Room service appropriate? Yes      10/15/20 0027          DVT prophylaxis:      Code Status: Full Code  Family Communication: No family at bedside  Status is: Inpatient  Remains inpatient appropriate because:Inpatient level of care appropriate due to severity of illness   Dispo: The patient is from: Home              Anticipated d/c is to: TBD              Patient currently is not medically stable to d/c.   Difficult to place patient No   Level of care: Med-Surg  Consultants:  Orthopedic surgery  Procedures:  None  Microbiology  None  Antimicrobials: None   Objective: Vitals:   10/19/20 0805 10/19/20 1427 10/19/20 1929 10/20/20 0277  BP: (!) 101/54 (!) 112/48 (!) 126/57 (!) 115/56  Pulse: 95 99 (!) 51 90  Resp: 14 14 16 16   Temp: 97.6 F (36.4 C) 97.7 F (36.5 C) 98.2 F (36.8 C) 97.7 F (36.5 C)  TempSrc: Oral Oral Oral Oral  SpO2: 94% 95% 96% 94%  Weight:      Height:        Intake/Output Summary (Last 24 hours) at 10/20/2020 1026 Last data filed at 10/20/2020 0543 Gross per 24 hour  Intake 0 ml  Output --  Net 0 ml   Filed Weights   10/14/20 1513  Weight: 75 kg    Examination:  Constitutional: No distress Eyes: No icterus ENMT: mmm Neck: normal,  supple Respiratory: Clear bilaterally without wheezing or crackles Cardiovascular: Regular rate and rhythm, no murmurs, no edema Abdomen: Soft, NT, ND, bowel sounds positive Musculoskeletal: no clubbing / cyanosis.  Skin: No rashes seen   Data Reviewed: I have independently reviewed following labs and imaging studies   CBC: Recent Labs  Lab 10/14/20 1553 10/15/20 0319 10/20/20 0034  WBC 7.1 6.8 11.9*  NEUTROABS 5.0  --   --   HGB 11.5* 11.1* 13.6  HCT 37.2 36.6 43.4  MCV 86.1 86.7 85.4  PLT 219 209 296   Basic Metabolic Panel: Recent Labs  Lab 10/14/20 1553 10/15/20 0319 10/15/20 1401 10/16/20 0241 10/20/20 0034  NA 144 141  --  141 138  K 3.7 3.2*  --  4.7 4.6  CL 106 103  --  106 96*  CO2 32 30  --  30 33*  GLUCOSE 267* 187*  --  145* 135*  BUN 17 12  --  17 27*  CREATININE 0.65 0.52  --  0.79 0.99  CALCIUM 9.7 9.3  --  9.3 10.9*  MG 1.6*  --  1.7 1.8  --    Liver Function Tests: Recent Labs  Lab 10/14/20 1553 10/15/20 0319  AST 20 19  ALT 19 20  ALKPHOS 121 106  BILITOT 0.3 0.5  PROT 7.2 6.5  ALBUMIN 3.7 3.4*   Coagulation Profile: No results for input(s): INR, PROTIME in the last 168 hours. HbA1C: No results for input(s): HGBA1C in the last 72 hours. CBG: Recent Labs  Lab 10/19/20 0634 10/19/20 1110 10/19/20 1641 10/19/20 2127 10/20/20 0627  GLUCAP 194* 188* 228* 103* 301*    Recent Results (from the past 240 hour(s))  SARS CORONAVIRUS 2 (TAT 6-24 HRS) Nasopharyngeal Nasopharyngeal Swab     Status: None   Collection Time: 10/14/20  9:35 PM   Specimen: Nasopharyngeal Swab  Result Value Ref Range Status   SARS Coronavirus 2 NEGATIVE NEGATIVE Final    Comment: (NOTE) SARS-CoV-2 target nucleic acids are NOT DETECTED.  The SARS-CoV-2 RNA is generally detectable in upper and lower respiratory specimens during the acute phase of infection. Negative results do not preclude SARS-CoV-2 infection, do not rule out co-infections with other  pathogens, and should not be used as the sole basis for treatment or other patient management decisions. Negative results must be combined with clinical observations, patient history, and epidemiological information. The expected result is Negative.  Fact Sheet for Patients: 10/16/20  Fact Sheet for Healthcare Providers: HairSlick.no  This test is not yet approved or cleared by the quierodirigir.com FDA and  has been authorized for detection and/or diagnosis of SARS-CoV-2 by FDA under an Emergency Use Authorization (EUA). This EUA will remain  in effect (meaning this test can be used) for the  duration of the COVID-19 declaration under Se ction 564(b)(1) of the Act, 21 U.S.C. section 360bbb-3(b)(1), unless the authorization is terminated or revoked sooner.  Performed at [facility], [address], [place], [place] [address]   Surgical pcr screen     Status: None   Collection Time: 10/19/20  6:42 PM   Specimen: Nasal Mucosa; Nasal Swab  Result Value Ref Range Status   MRSA, PCR NEGATIVE NEGATIVE Final   Staphylococcus aureus NEGATIVE NEGATIVE Final    Comment: (NOTE) The Xpert SA Assay (FDA approved for NASAL specimens in patients 58 years of age and older), is one component of a comprehensive surveillance program. It is not intended to diagnose infection nor to guide or monitor treatment. Performed at [facility], [address] [place], [place] [address]      Radiology Studies: No results found.   Pamella Pert, MD, PhD [facility]  Between 7 am - 7 pm I am available, please contact me via Amion (for emergencies) or Securechat (non urgent messages)  Between 7 pm - 7 am I am not available, please contact night coverage MD/APP via Amion

## 2020-10-21 ENCOUNTER — Inpatient Hospital Stay (HOSPITAL_COMMUNITY): Payer: Medicare Other

## 2020-10-21 DIAGNOSIS — E119 Type 2 diabetes mellitus without complications: Secondary | ICD-10-CM | POA: Diagnosis not present

## 2020-10-21 DIAGNOSIS — W19XXXA Unspecified fall, initial encounter: Secondary | ICD-10-CM | POA: Diagnosis not present

## 2020-10-21 DIAGNOSIS — G9520 Unspecified cord compression: Secondary | ICD-10-CM | POA: Diagnosis not present

## 2020-10-21 DIAGNOSIS — I2510 Atherosclerotic heart disease of native coronary artery without angina pectoris: Secondary | ICD-10-CM | POA: Diagnosis not present

## 2020-10-21 LAB — CBC
HCT: 35 % — ABNORMAL LOW (ref 36.0–46.0)
Hemoglobin: 10.8 g/dL — ABNORMAL LOW (ref 12.0–15.0)
MCH: 26.9 pg (ref 26.0–34.0)
MCHC: 30.9 g/dL (ref 30.0–36.0)
MCV: 87.1 fL (ref 80.0–100.0)
Platelets: 238 10*3/uL (ref 150–400)
RBC: 4.02 MIL/uL (ref 3.87–5.11)
RDW: 15.2 % (ref 11.5–15.5)
WBC: 11.1 10*3/uL — ABNORMAL HIGH (ref 4.0–10.5)
nRBC: 0 % (ref 0.0–0.2)

## 2020-10-21 LAB — BASIC METABOLIC PANEL
Anion gap: 10 (ref 5–15)
BUN: 19 mg/dL (ref 8–23)
CO2: 26 mmol/L (ref 22–32)
Calcium: 9.2 mg/dL (ref 8.9–10.3)
Chloride: 98 mmol/L (ref 98–111)
Creatinine, Ser: 0.75 mg/dL (ref 0.44–1.00)
GFR, Estimated: >60 mL/min (ref >60)
Glucose, Bld: 329 mg/dL — ABNORMAL HIGH (ref 70–99)
Potassium: 4.7 mmol/L (ref 3.5–5.1)
Sodium: 134 mmol/L — ABNORMAL LOW (ref 135–145)

## 2020-10-21 LAB — GLUCOSE, CAPILLARY
Glucose-Capillary: 254 mg/dL — ABNORMAL HIGH (ref 70–99)
Glucose-Capillary: 238 mg/dL — ABNORMAL HIGH (ref 70–99)
Glucose-Capillary: 284 mg/dL — ABNORMAL HIGH (ref 70–99)
Glucose-Capillary: 293 mg/dL — ABNORMAL HIGH (ref 70–99)
Glucose-Capillary: 211 mg/dL — ABNORMAL HIGH (ref 70–99)

## 2020-10-21 LAB — CYCLIC CITRUL PEPTIDE ANTIBODY, IGG/IGA: CCP Antibodies IgG/IgA: 5 units (ref 0–19)

## 2020-10-21 MED ORDER — SODIUM CHLORIDE 0.9 % IV SOLN: INTRAVENOUS | Status: DC

## 2020-10-21 MED ORDER — DEXAMETHASONE SODIUM PHOSPHATE 10 MG/ML IJ SOLN
4.0000 mg | Freq: Two times a day (BID) | INTRAMUSCULAR | Status: AC
  Administered 2020-10-21: 4 mg via INTRAVENOUS
  Filled 2020-10-21 (×3): qty 1

## 2020-10-21 MED ORDER — METHOCARBAMOL 1000 MG/10ML IJ SOLN: 500.0000 mg | Freq: Four times a day (QID) | INTRAVENOUS | Status: DC | PRN

## 2020-10-21 MED ORDER — CEFAZOLIN SODIUM-DEXTROSE 2-4 GM/100ML-% IV SOLN
2.0000 g | Freq: Three times a day (TID) | INTRAVENOUS | Status: AC
  Administered 2020-10-21 (×2): 2 g via INTRAVENOUS
  Filled 2020-10-21 (×2): qty 100

## 2020-10-21 MED ORDER — SODIUM CHLORIDE 0.9 % IV SOLN: 250.0000 mL | INTRAVENOUS | Status: DC

## 2020-10-21 MED ORDER — FLEET ENEMA 7-19 GM/118ML RE ENEM
1.0000 | ENEMA | Freq: Once | RECTAL | Status: AC | PRN
  Administered 2020-10-21: 1 via RECTAL
  Filled 2020-10-21: qty 1

## 2020-10-21 MED ORDER — OXYCODONE HCL 5 MG PO TABS
10.0000 mg | ORAL_TABLET | ORAL | Status: DC | PRN
  Administered 2020-10-21 – 2020-10-25 (×9): 10 mg via ORAL
  Filled 2020-10-21 (×9): qty 2

## 2020-10-21 MED ORDER — SODIUM CHLORIDE 0.9% FLUSH: 3.0000 mL | INTRAVENOUS | Status: DC | PRN

## 2020-10-21 MED ORDER — MENTHOL 3 MG MT LOZG: 1.0000 | LOZENGE | OROMUCOSAL | Status: DC | PRN

## 2020-10-21 MED ORDER — DEXAMETHASONE 4 MG PO TABS
4.0000 mg | ORAL_TABLET | Freq: Two times a day (BID) | ORAL | Status: AC
  Administered 2020-10-21 – 2020-10-22 (×3): 4 mg via ORAL
  Filled 2020-10-21 (×4): qty 1

## 2020-10-21 MED ORDER — MORPHINE SULFATE (PF) 2 MG/ML IV SOLN: 1.0000 mg | INTRAVENOUS | Status: DC | PRN

## 2020-10-21 MED ORDER — POLYETHYLENE GLYCOL 3350 17 G PO PACK: 17.0000 g | PACK | Freq: Every day | ORAL | Status: DC | PRN

## 2020-10-21 MED ORDER — PANTOPRAZOLE SODIUM 40 MG IV SOLR
40.0000 mg | Freq: Every day | INTRAVENOUS | Status: DC
  Administered 2020-10-21: 40 mg via INTRAVENOUS
  Filled 2020-10-21: qty 40

## 2020-10-21 MED ORDER — METHOCARBAMOL 500 MG PO TABS
500.0000 mg | ORAL_TABLET | Freq: Four times a day (QID) | ORAL | Status: DC | PRN
  Administered 2020-10-21 – 2020-10-25 (×6): 500 mg via ORAL
  Filled 2020-10-21 (×6): qty 1

## 2020-10-21 MED ORDER — DOCUSATE SODIUM 100 MG PO CAPS
100.0000 mg | ORAL_CAPSULE | Freq: Two times a day (BID) | ORAL | Status: DC
  Administered 2020-10-21 – 2020-10-26 (×12): 100 mg via ORAL
  Filled 2020-10-21 (×11): qty 1

## 2020-10-21 MED ORDER — ONDANSETRON HCL 4 MG PO TABS
4.0000 mg | ORAL_TABLET | Freq: Four times a day (QID) | ORAL | Status: DC | PRN
  Administered 2020-10-21: 4 mg via ORAL
  Filled 2020-10-21 (×2): qty 1

## 2020-10-21 MED ORDER — ONDANSETRON HCL 4 MG/2ML IJ SOLN: 4.0000 mg | Freq: Four times a day (QID) | INTRAMUSCULAR | Status: DC | PRN

## 2020-10-21 MED ORDER — BISACODYL 5 MG PO TBEC: 5.0000 mg | DELAYED_RELEASE_TABLET | Freq: Every day | ORAL | Status: DC | PRN

## 2020-10-21 MED ORDER — INSULIN ASPART 100 UNIT/ML IJ SOLN
4.0000 [IU] | Freq: Three times a day (TID) | INTRAMUSCULAR | Status: DC
  Administered 2020-10-21 – 2020-10-26 (×17): 4 [IU] via SUBCUTANEOUS

## 2020-10-21 MED ORDER — PANTOPRAZOLE SODIUM 40 MG PO TBEC
40.0000 mg | DELAYED_RELEASE_TABLET | Freq: Every day | ORAL | Status: DC
  Administered 2020-10-21 – 2020-10-25 (×5): 40 mg via ORAL
  Filled 2020-10-21: qty 1
  Filled 2020-10-21: qty 2
  Filled 2020-10-21 (×3): qty 1

## 2020-10-21 MED ORDER — PHENOL 1.4 % MT LIQD: 1.0000 | OROMUCOSAL | Status: DC | PRN

## 2020-10-21 MED ORDER — ALUM & MAG HYDROXIDE-SIMETH 200-200-20 MG/5ML PO SUSP: 30.0000 mL | Freq: Four times a day (QID) | ORAL | Status: DC | PRN

## 2020-10-21 MED ORDER — SODIUM CHLORIDE 0.9% FLUSH
3.0000 mL | Freq: Two times a day (BID) | INTRAVENOUS | Status: DC
  Administered 2020-10-21: 3 mL via INTRAVENOUS

## 2020-10-21 NOTE — Evaluation (Signed)
Occupational Therapy Evaluation Patient Details Name: [name] MRN: [id number] DOB: Feb 09, 1956 Today's Date: 10/21/2020    History of Present Illness 65 year old female with history of bilateral knee replacement, left knee replaced January 2022, anxiety disorder, CAD, diabetes, depression, asthma, kidney stones with history of ureteral stents admitted with multiple falls at home.  She thinks the last time she walked normal was in April.  She reports her legs/knees gave way when she tries to walk. She was found to have lower cervical degeneration.    Clinical Impression   Patient is s/p C5-6, C6-7 ANTERIOR CERVICAL DISCECTOMY, C6 CORPECTOMY, C5-C7 PLATE, ALLOGRAFT surgery resulting in the deficits listed below (see OT Problem List). Pt reports falling multiple times prior to sx and has had very limited ability prior to sx. Pt presents with general weakness from decrease use of extremities prior to sx. Pt is very motivated to complete therapy at this time.  Patient will benefit from skilled OT to increase their safety and independence with ADL and functional mobility for ADL (while adhering to their precautions) to facilitate discharge to venue listed below. *      Follow Up Recommendations  CIR;Supervision - Intermittent    Equipment Recommendations       Recommendations for Other Services       Precautions / Restrictions Precautions Precautions: Fall Required Braces or Orthoses: Cervical Brace Cervical Brace: Hard collar Restrictions Weight Bearing Restrictions: No      Mobility Bed Mobility Overal bed mobility: Needs Assistance Bed Mobility: Rolling;Sidelying to Sit Rolling: Min assist Sidelying to sit: Min assist Supine to sit: Min assist     General bed mobility comments: Min a to sit up at the edge of the bed. Patient reported mild syncope but that cleared as she sat.    Transfers Overall transfer level: Needs assistance Equipment used: Ambulation equipment  used Transfers: Sit to/from Stand Sit to Stand: Min assist Stand pivot transfers: Mod assist       General transfer comment: Min a for intial stability to stand.    Balance Overall balance assessment: Needs assistance Sitting-balance support: No upper extremity supported;Feet supported Sitting balance-Leahy Scale: Fair Sitting balance - Comments: initially requiring UE support d/t dizziness; once dizziness resolved able to maintain midline     Standing balance-Leahy Scale: Poor Standing balance comment: heavily reliant on UEs                           ADL either performed or assessed with clinical judgement   ADL Overall ADL's : Needs assistance/impaired Eating/Feeding: Set up;Sitting   Grooming: Wash/dry hands;Wash/dry face;Sitting;Set up   Upper Body Bathing: Minimal assistance;Cueing for safety;Cueing for sequencing;Sitting   Lower Body Bathing: Moderate assistance;Cueing for safety;Cueing for sequencing;Sit to/from stand   Upper Body Dressing : Minimal assistance;Cueing for safety;Cueing for sequencing;Sitting   Lower Body Dressing: Moderate assistance;Cueing for safety;Cueing for sequencing;Sit to/from stand   Toilet Transfer: Min guard;Cueing for safety;Cueing for sequencing   Toileting- Architect and Hygiene: Sit to/from stand;Minimal assistance       Functional mobility during ADLs: Min guard;Cueing for safety;Cueing for sequencing;Rolling walker       Vision         Perception     Praxis      Pertinent Vitals/Pain Pain Assessment: 0-10 Pain Score: 6  Pain Location: cervical area Pain Descriptors / Indicators: Aching Pain Intervention(s): Limited activity within patient's tolerance     Hand Dominance Right  Extremity/Trunk Assessment Upper Extremity Assessment Upper Extremity Assessment: Generalized weakness   Lower Extremity Assessment Lower Extremity Assessment: Defer to PT evaluation LLE Deficits / Details: geeral  wekaness in left LE LLE Sensation: WNL LLE Coordination: WNL       Communication Communication Communication: No difficulties   Cognition Arousal/Alertness: Awake/alert Behavior During Therapy: WFL for tasks assessed/performed Overall Cognitive Status: Within Functional Limits for tasks assessed                                 General Comments: Patient very pleasent and motivated   General Comments       Exercises Exercises: General Lower Extremity General Exercises - Lower Extremity Ankle Circles/Pumps: AROM;Both;15 reps Quad Sets: AROM;Both;15 reps Gluteal Sets: 15 reps   Shoulder Instructions      Home Living Family/patient expects to be discharged to:: Private residence Living Arrangements: Children Available Help at Discharge: Family;Available 24 hours/day Type of Home: Mobile home Home Access: Stairs to enter Entrance Stairs-Number of Steps: 4 Entrance Stairs-Rails: Right Home Layout: One level     Bathroom Shower/Tub: Chief Strategy Officer: Standard Bathroom Accessibility: Yes   Home Equipment: Cane - single point;Bedside commode;Walker - 2 wheels   Additional Comments: Pt will be assisted by children and grandchildren      Prior Functioning/Environment Level of Independence:  (Pt was starting to decline and had multiple falls)        Comments: Prior to surgery she was transferign with the steady to a chair only. At home she was ambualted limited distance and having frequent falls with walker.        OT Problem List: Decreased strength;Decreased range of motion;Decreased activity tolerance;Impaired balance (sitting and/or standing);Decreased safety awareness;Decreased knowledge of use of DME or AE;Pain      OT Treatment/Interventions: Self-care/ADL training;Therapeutic exercise;Energy conservation;DME and/or AE instruction;Therapeutic activities;Patient/family education;Balance training    OT Goals(Current goals can be  found in the care plan section) Acute Rehab OT Goals Patient Stated Goal: to get strong OT Goal Formulation: With patient Time For Goal Achievement: 11/04/20 Potential to Achieve Goals: Good ADL Goals Pt Will Perform Upper Body Bathing: with modified independence;sitting Pt Will Perform Lower Body Bathing: with modified independence;sit to/from stand Pt Will Transfer to Toilet: with modified independence;ambulating Pt Will Perform Tub/Shower Transfer: with modified independence;shower seat;ambulating  OT Frequency: Min 2X/week   Barriers to D/C:            Co-evaluation PT/OT/SLP Co-Evaluation/Treatment: Yes Reason for Co-Treatment: Complexity of the patient's impairments (multi-system involvement)   OT goals addressed during session: ADL's and self-care      AM-PAC OT "6 Clicks" Daily Activity     Outcome Measure Help from another person eating meals?: None Help from another person taking care of personal grooming?: A Little Help from another person toileting, which includes using toliet, bedpan, or urinal?: A Lot Help from another person bathing (including washing, rinsing, drying)?: A Lot Help from another person to put on and taking off regular upper body clothing?: A Little Help from another person to put on and taking off regular lower body clothing?: A Lot 6 Click Score: 16   End of Session Equipment Utilized During Treatment: Gait belt;Rolling walker Nurse Communication: Mobility status;Other (comment) (o2)  Activity Tolerance: Patient tolerated treatment well Patient left: in chair;with call bell/phone within reach;with chair alarm set  OT Visit Diagnosis: Unsteadiness on feet (R26.81);Other abnormalities of gait  and mobility (R26.89);Repeated falls (R29.6);History of falling (Z91.81);Muscle weakness (generalized) (M62.81);Pain Pain - part of body:  (neck)                Time: 1517-6160 OT Time Calculation (min): 22 min Charges:  OT General Charges $OT Visit: 1  Visit OT Evaluation $OT Eval Low Complexity: 1 Low  Alphia Moh OTR/L  Acute Rehab Services  [phone] office number [phone] pager number   Alphia Moh 10/21/2020, 12:51 PM

## 2020-10-21 NOTE — Progress Notes (Signed)
Inpatient Rehab Admissions Coordinator:   Met with patient at bedside to discuss potential CIR admission. Pt. Stated interest and confirms that he daughter can provide 24/7 support.  Will pursue for potential admit next week, pending bed availability and insurance auth.   Clemens Catholic, [place], [place] Admissions Coordinator  [phone] ([place]) [phone] (office)

## 2020-10-21 NOTE — Evaluation (Signed)
Physical Therapy Evaluation Patient Details Name: [name] MRN: [id number] DOB: 1956-02-02 Today's Date: 10/21/2020   History of Present Illness  65 year old female with history of bilateral knee replacement, left knee replaced January 2022, anxiety disorder, CAD, diabetes, depression, asthma, kidney stones with history of ureteral stents admitted with multiple falls at home.  She thinks the last time she walked normal was in April.  She reports her legs/knees gave way when she tries to walk. She was found to have lower ccervical degeneration. She had a  Clinical Impression  Patient required assist for bed mobility, transfers, and gait. She required less assist then prior to surgery. Her left leg became weak with ambulation. At baseline she was walking around at home although she had falls. She is motivated to get home as soon as able. She would benefit most from CIR for more aggressive rehab to return home. She has several steps to get into her house. Acute therapy will continue to progress as tolerated.     Follow Up Recommendations CIR    Equipment Recommendations  None recommended by PT    Recommendations for Other Services Rehab consult     Precautions / Restrictions Precautions Precautions: Fall Required Braces or Orthoses: Cervical Brace Cervical Brace: Hard collar Restrictions Weight Bearing Restrictions: No      Mobility  Bed Mobility Overal bed mobility: Needs Assistance Bed Mobility: Rolling;Sidelying to Sit Rolling: Min assist Sidelying to sit: Min assist       General bed mobility comments: Min a to sit up at the edge of the bed. Patient reported mild syncope but that cleared as she sat.    Transfers Overall transfer level: Needs assistance Equipment used: Ambulation equipment used Transfers: Sit to/from Stand Sit to Stand: Min assist         General transfer comment: Min a for intial stability to stand.  Ambulation/Gait Ambulation/Gait assistance:  Min assist Gait Distance (Feet): 4 Feet     Gait velocity: decreased   General Gait Details: Patient reported as she walked that her left LE was becoming weaker. She required min a for stability and cuing to turn to the chair to sit.  Stairs            Wheelchair Mobility    Modified Rankin (Stroke Patients Only)       Balance Overall balance assessment: Needs assistance Sitting-balance support: No upper extremity supported;Feet supported Sitting balance-Leahy Scale: Fair Sitting balance - Comments: initially requiring UE support d/t dizziness; once dizziness resolved able to maintain midline     Standing balance-Leahy Scale: Poor Standing balance comment: heavily reliant on UEs                             Pertinent Vitals/Pain Pain Assessment: 0-10 Pain Score: 6  Pain Location: cervical area Pain Descriptors / Indicators: Aching Pain Intervention(s): Limited activity within patient's tolerance    Home Living Family/patient expects to be discharged to:: Private residence Living Arrangements: Children Available Help at Discharge: Family;Available 24 hours/day Type of Home: Mobile home Home Access: Stairs to enter Entrance Stairs-Rails: Right Entrance Stairs-Number of Steps: 4 Home Layout: One level Home Equipment: Cane - single point;Bedside commode;Walker - 2 wheels Additional Comments: Pt will be assisted by children and grandchildren    Prior Function Level of Independence:  (Pt was starting to decline and had multiple falls)         Comments: Prior to surgery she was  transferign with the steady to a chair only. At home she was ambualted limited distance and having frequent falls with walker.     Hand Dominance   Dominant Hand: Right    Extremity/Trunk Assessment   Upper Extremity Assessment Upper Extremity Assessment: Generalized weakness    Lower Extremity Assessment Lower Extremity Assessment: Defer to PT evaluation LLE  Deficits / Details: geeral wekaness in left LE LLE Sensation: WNL LLE Coordination: WNL       Communication   Communication: No difficulties  Cognition Arousal/Alertness: Awake/alert Behavior During Therapy: WFL for tasks assessed/performed Overall Cognitive Status: Within Functional Limits for tasks assessed                                 General Comments: Patient very pleasent and motivated      General Comments      Exercises General Exercises - Lower Extremity Ankle Circles/Pumps: AROM;Both;15 reps Quad Sets: AROM;Both;15 reps Gluteal Sets: 15 reps   Assessment/Plan    PT Assessment Patient needs continued PT services  PT Problem List Decreased strength;Decreased range of motion;Decreased activity tolerance;Decreased mobility;Decreased balance       PT Treatment Interventions DME instruction;Therapeutic activities;Gait training;Functional mobility training;Balance training;Therapeutic exercise;Patient/family education    PT Goals (Current goals can be found in the Care Plan section)  Acute Rehab PT Goals Patient Stated Goal: maybe go to rehab PT Goal Formulation: With patient Time For Goal Achievement: 10/30/20 Potential to Achieve Goals: Good    Frequency Min 3X/week   Barriers to discharge        Co-evaluation   Reason for Co-Treatment: Complexity of the patient's impairments (multi-system involvement)   OT goals addressed during session: ADL's and self-care       AM-PAC PT "6 Clicks" Mobility  Outcome Measure Help needed turning from your back to your side while in a flat bed without using bedrails?: A Little Help needed moving from lying on your back to sitting on the side of a flat bed without using bedrails?: A Little Help needed moving to and from a bed to a chair (including a wheelchair)?: A Lot Help needed standing up from a chair using your arms (e.g., wheelchair or bedside chair)?: A Lot Help needed to walk in hospital room?:  Total Help needed climbing 3-5 steps with a railing? : Total 6 Click Score: 12    End of Session Equipment Utilized During Treatment: Gait belt Activity Tolerance: Patient limited by fatigue Patient left: in chair;with call bell/phone within reach;with chair alarm set Nurse Communication: Mobility status PT Visit Diagnosis: Other abnormalities of gait and mobility (R26.89);Muscle weakness (generalized) (M62.81)    Time: 1610-9604 PT Time Calculation (min) (ACUTE ONLY): 22 min   Charges:   PT Evaluation $PT Eval Moderate Complexity: 1 Mod            Dessie Coma PT DPT  10/21/2020, 12:40 PM  Physical Therapy Evaluation Patient Details Name: [name] MRN: [id number] DOB: 1955-08-02 Today's Date: 10/21/2020   History of Present Illness  65 year old female with history of bilateral knee replacement, left knee replaced January 2022, anxiety disorder, CAD, diabetes, depression, asthma, kidney stones with history of ureteral stents admitted with multiple falls at home.  She thinks the last time she walked normal was in April.  She reports her legs/knees gave way when she tries to walk. She was found to have lower ccervical degeneration. She had a  Clinical  Impression      Follow Up Recommendations SNF    Equipment Recommendations  None recommended by PT    Recommendations for Other Services Rehab consult     Precautions / Restrictions Precautions Precautions: Fall Required Braces or Orthoses: Cervical Brace Cervical Brace: Hard collar Restrictions Weight Bearing Restrictions: No      Mobility  Bed Mobility Overal bed mobility: Needs Assistance Bed Mobility: Rolling;Sidelying to Sit Rolling: Min assist Sidelying to sit: Min assist       General bed mobility comments: Min a to sit up at the edge of the bed. Patient reported mild syncope but that cleared as she sat.    Transfers Overall transfer level: Needs assistance Equipment used: Ambulation  equipment used Transfers: Sit to/from Stand Sit to Stand: Min assist         General transfer comment: Min a for intial stability to stand.  Ambulation/Gait Ambulation/Gait assistance: Min assist Gait Distance (Feet): 4 Feet     Gait velocity: decreased   General Gait Details: Patient reported as she walked that her left LE was becoming weaker. She required min a for stability and cuing to turn to the chair to sit.  Stairs            Wheelchair Mobility    Modified Rankin (Stroke Patients Only)       Balance Overall balance assessment: Needs assistance Sitting-balance support: No upper extremity supported;Feet supported Sitting balance-Leahy Scale: Fair Sitting balance - Comments: initially requiring UE support d/t dizziness; once dizziness resolved able to maintain midline     Standing balance-Leahy Scale: Poor Standing balance comment: heavily reliant on UEs                             Pertinent Vitals/Pain Pain Assessment: 0-10 Pain Score: 6  Pain Location: cervical area Pain Descriptors / Indicators: Aching Pain Intervention(s): Limited activity within patient's tolerance    Home Living Family/patient expects to be discharged to:: Private residence Living Arrangements: Children Available Help at Discharge: Family;Available 24 hours/day Type of Home: Mobile home Home Access: Stairs to enter Entrance Stairs-Rails: Right Entrance Stairs-Number of Steps: 4 Home Layout: One level Home Equipment: Cane - single point;Bedside commode;Walker - 2 wheels Additional Comments: Pt will be assisted by children and grandchildren    Prior Function Level of Independence:  (Pt was starting to decline and had multiple falls)         Comments: Prior to surgery she was transferign with the steady to a chair only. At home she was ambualted limited distance and having frequent falls with walker.     Hand Dominance   Dominant Hand: Right     Extremity/Trunk Assessment   Upper Extremity Assessment Upper Extremity Assessment: Generalized weakness    Lower Extremity Assessment Lower Extremity Assessment: Defer to PT evaluation LLE Deficits / Details: geeral wekaness in left LE LLE Sensation: WNL LLE Coordination: WNL       Communication   Communication: No difficulties  Cognition Arousal/Alertness: Awake/alert Behavior During Therapy: WFL for tasks assessed/performed Overall Cognitive Status: Within Functional Limits for tasks assessed                                 General Comments: Patient very pleasent and motivated      General Comments      Exercises General Exercises - Lower Extremity Ankle Circles/Pumps: AROM;Both;15  reps Quad Sets: AROM;Both;15 reps Gluteal Sets: 15 reps   Assessment/Plan    PT Assessment Patient needs continued PT services  PT Problem List Decreased strength;Decreased range of motion;Decreased activity tolerance;Decreased mobility;Decreased balance       PT Treatment Interventions DME instruction;Therapeutic activities;Gait training;Functional mobility training;Balance training;Therapeutic exercise;Patient/family education    PT Goals (Current goals can be found in the Care Plan section)  Acute Rehab PT Goals Patient Stated Goal: maybe go to rehab PT Goal Formulation: With patient Time For Goal Achievement: 10/30/20 Potential to Achieve Goals: Good    Frequency Min 3X/week   Barriers to discharge        Co-evaluation   Reason for Co-Treatment: Complexity of the patient's impairments (multi-system involvement)   OT goals addressed during session: ADL's and self-care       AM-PAC PT "6 Clicks" Mobility  Outcome Measure Help needed turning from your back to your side while in a flat bed without using bedrails?: A Little Help needed moving from lying on your back to sitting on the side of a flat bed without using bedrails?: A Little Help needed moving  to and from a bed to a chair (including a wheelchair)?: A Lot Help needed standing up from a chair using your arms (e.g., wheelchair or bedside chair)?: A Lot Help needed to walk in hospital room?: Total Help needed climbing 3-5 steps with a railing? : Total 6 Click Score: 12    End of Session Equipment Utilized During Treatment: Gait belt Activity Tolerance: Patient limited by fatigue Patient left: in chair;with call bell/phone within reach;with chair alarm set Nurse Communication: Mobility status PT Visit Diagnosis: Other abnormalities of gait and mobility (R26.89);Muscle weakness (generalized) (M62.81)    Time: 4098-1191 PT Time Calculation (min) (ACUTE ONLY): 22 min   Charges:   PT Evaluation $PT Eval Moderate Complexity: 1 Mod            Dessie Coma PT DPT  10/21/2020, 12:40 PM

## 2020-10-21 NOTE — Progress Notes (Signed)
To: Dr. Otelia Sergeant  Patient's sister would like to speak with you today when rounding.  Her name is Wandra Scot and her telephone number is [phone].

## 2020-10-21 NOTE — Progress Notes (Signed)
Fleet enema given, tolerated well. Produced large hard BM and patient expressed relief.

## 2020-10-21 NOTE — Progress Notes (Addendum)
     Subjective: 1 Day Post-Op Procedure(s) (LRB): C5-6, C6-7 ANTERIOR CERVICAL DISCECTOMY, C6 CORPECTOMY, C5-C7 PLATE, ALLOGRAFT (N/A) Awake, alert and oriented x 4. Foley removed today, she is sitting at bedside, arms feel better and stronger. She is standing and using walker, still some balance difficulty.  Hemovac to cannister intact. Will leave and remove in AM 5/29 Soft diet or liquid as tolerated.  Patient reports pain as moderate.    Objective:   VITALS:  Temp:  [97.6 F (36.4 C)-98.5 F (36.9 C)] 98 F (36.7 C) (05/28 0745) Pulse Rate:  [47-97] 89 (05/28 0745) Resp:  [10-18] 17 (05/28 0745) BP: (106-132)/(47-80) 121/67 (05/28 0745) SpO2:  [92 %-98 %] 98 % (05/28 0420) Arterial Line BP: (132)/(59) 132/59 (05/27 1825) Weight:  [74.4 kg] 74.4 kg (05/27 1215)  Neurologically intact ABD soft Neurovascular intact Sensation intact distally Intact pulses distally Dorsiflexion/Plantar flexion intact Incision: dressing C/D/I and moderate drainage   LABS Recent Labs    10/20/20 0034 10/21/20 0123  HGB 13.6 10.8*  WBC 11.9* 11.1*  PLT 296 238   Recent Labs    10/20/20 0034 10/21/20 0123  NA 138 134*  K 4.6 4.7  CL 96* 98  CO2 33* 26  BUN 27* 19  CREATININE 0.99 0.75  GLUCOSE 135* 329*   No results for input(s): LABPT, INR in the last 72 hours.   Assessment/Plan: 1 Day Post-Op Procedure(s) (LRB): C5-6, C6-7 ANTERIOR CERVICAL DISCECTOMY, C6 CORPECTOMY, C5-C7 PLATE, ALLOGRAFT (N/A)  Advance diet Up with therapy  CIR consult.  I called and spoke with this patient's sister and she indicates that the patient returning to her home to live and rehabilitate is not a good environment due to concerns of the daughter not being supportive of  [name]. The sister lives in [place]. [place] and is recovering from a course of chemotherapy herself but states she would be willing to help care for her Post hospitalization if indicated.  I told her that it is less than 24 hours  since her surgery and we are starting the rehabilitation process. CIR has been consulted and we will develop a plan for post operative care that will be supportive of [name]. [name] 10/21/2020, 9:17 AMPatient ID: [name], female   DOB: Feb 11, 1956, 65 y.o.   MRN: [id number]

## 2020-10-21 NOTE — Progress Notes (Signed)
PROGRESS NOTE  [name] XBM:[id number] DOB: 11-22-1955 DOA: 10/14/2020 PCP: Courtney Paris, NP   LOS: 7 days   Brief Narrative / Interim history: 65 year old woman with diabetes, CAD, OA, comes into the hospital with multiple falls at home, right leg weakness.  She is also been complaining of decreased sensation in her hands.  Lumbar spine MRI showed multilevel lumbar spondylosis L1-2 and L4-5, moderate canal stenosis and severe bilateral foraminal stenosis L4-5 L5-S1.  C-spine MRI was done due to hand involvement which showed severe spinal stenosis and cord compression at C5-6 level possibly C6-7.  Orthopedic surgery consulted  Subjective / 24h Interval events: No pain, no complaints.  No shortness of breath.  No palpitations.  Assessment & Plan: Principal Problem Severe spinal stenosis and cord compression C5-6, C6-7 -orthopedic surgery consulted, she has been transferred from [facility] to [facility] and she is status post C 5-6, C6-7 anterior cervical discectomy, C6 corpectomy, C5-7 plate allograft -Doing well postop, still has some numbness in her hands but feels better.  Lower extremity weakness also feels improved.  Active Problems Low back pain-chronic, moderate canal stenosis and severe bilateral foraminal stenosis L4-5, L5-S1.  Conservative management for now  PVCs, questionable bradycardia-patient with frequent PVCs, received magnesium last night, potassium normal this morning.  She is asymptomatic, will check a 2D echo.  If echo is normal this can be followed up as an outpatient.  Since on telemetry she did not have any significant bradycardic episodes  Polypharmacy-recommend further addressed as an outpatient  CAD-asymptomatic  DM 2, poorly controlled with hyperglycemia-A1c 8.2.  Continue Levemir, sliding scale  CBG (last 3)  Recent Labs    10/20/20 2150 10/21/20 0645 10/21/20 0749  GLUCAP 327* 254* 238*   Scheduled Meds: . sodium chloride   Intravenous  Once  . amitriptyline  10 mg Oral QHS  . ARIPiprazole  5 mg Oral Daily  . dexamethasone (DECADRON) injection  4 mg Intravenous Q12H   Or  . dexamethasone  4 mg Oral Q12H  . docusate sodium  100 mg Oral BID  . fenofibrate  54 mg Oral Daily  . gabapentin  800 mg Oral TID  . insulin aspart  0-15 Units Subcutaneous TID WC  . insulin aspart  0-5 Units Subcutaneous QHS  . insulin aspart  4 Units Subcutaneous TID WC  . insulin detemir  24 Units Subcutaneous QHS  . magnesium gluconate  500 mg Oral QHS  . oxyCODONE  10 mg Oral Q12H  . pantoprazole (PROTONIX) IV  40 mg Intravenous QHS  . pravastatin  80 mg Oral Daily  . sertraline  100 mg Oral QHS  . sodium chloride flush  3 mL Intravenous Q12H  . vitamin B-12  1,000 mcg Oral Daily   Continuous Infusions: . sodium chloride 75 mL/hr at 10/21/20 0336  . sodium chloride    . lactated ringers 10 mL/hr at 10/20/20 1229  . methocarbamol (ROBAXIN) IV     PRN Meds:.albuterol, albuterol, alum & mag hydroxide-simeth, bisacodyl, menthol-cetylpyridinium **OR** phenol, methocarbamol **OR** methocarbamol (ROBAXIN) IV, morphine injection, ondansetron **OR** ondansetron (ZOFRAN) IV, oxyCODONE, polyethylene glycol, sodium chloride flush, sodium phosphate  Diet Orders (From admission, onward)    Start     Ordered   10/15/20 0028  Diet Carb Modified Fluid consistency: Thin; Room service appropriate? Yes  Diet effective now       Question Answer Comment  Diet-HS Snack? Nothing   Calorie Level Medium 1600-2000   Fluid consistency: Thin  Room service appropriate? Yes      10/15/20 0027          DVT prophylaxis:      Code Status: Full Code  Family Communication: No family at bedside  Status is: Inpatient  Remains inpatient appropriate because:Inpatient level of care appropriate due to severity of illness   Dispo: The patient is from: Home              Anticipated d/c is to: TBD              Patient currently is not medically stable to  d/c.   Difficult to place patient No   Level of care: Med-Surg  Consultants:  Orthopedic surgery  Procedures:  None  Microbiology  None  Antimicrobials: None   Objective: Vitals:   10/20/20 2100 10/21/20 0100 10/21/20 0420 10/21/20 0745  BP: (!) 132/59 123/80 107/79 121/67  Pulse: 97 90 76 89  Resp: 17 17 17 17   Temp: 97.6 F (36.4 C) 97.7 F (36.5 C) 98 F (36.7 C) 98 F (36.7 C)  TempSrc: Oral Oral Oral Oral  SpO2: 97% 97% 98%   Weight:      Height:        Intake/Output Summary (Last 24 hours) at 10/21/2020 1011 Last data filed at 10/21/2020 0600 Gross per 24 hour  Intake 1850 ml  Output 3100 ml  Net -1250 ml   Filed Weights   10/14/20 1513 10/20/20 1215  Weight: 75 kg 74.4 kg    Examination:  Constitutional: NAD Eyes: No icterus ENMT: Moist mucous membranes Neck: normal, supple Respiratory: Clear bilaterally, no wheezing, no crackles Cardiovascular: Regular rate and rhythm, no murmurs, no edema Abdomen: Soft, NT, ND, bowel sounds positive Musculoskeletal: no clubbing / cyanosis.  Skin: No rashes seen   Data Reviewed: I have independently reviewed following labs and imaging studies   CBC: Recent Labs  Lab 10/14/20 1553 10/15/20 0319 10/20/20 0034 10/21/20 0123  WBC 7.1 6.8 11.9* 11.1*  NEUTROABS 5.0  --   --   --   HGB 11.5* 11.1* 13.6 10.8*  HCT 37.2 36.6 43.4 35.0*  MCV 86.1 86.7 85.4 87.1  PLT 219 209 296 238   Basic Metabolic Panel: Recent Labs  Lab 10/14/20 1553 10/15/20 0319 10/15/20 1401 10/16/20 0241 10/20/20 0034 10/21/20 0123  NA 144 141  --  141 138 134*  K 3.7 3.2*  --  4.7 4.6 4.7  CL 106 103  --  106 96* 98  CO2 32 30  --  30 33* 26  GLUCOSE 267* 187*  --  145* 135* 329*  BUN 17 12  --  17 27* 19  CREATININE 0.65 0.52  --  0.79 0.99 0.75  CALCIUM 9.7 9.3  --  9.3 10.9* 9.2  MG 1.6*  --  1.7 1.8  --   --    Liver Function Tests: Recent Labs  Lab 10/14/20 1553 10/15/20 0319  AST 20 19  ALT 19 20   ALKPHOS 121 106  BILITOT 0.3 0.5  PROT 7.2 6.5  ALBUMIN 3.7 3.4*   Coagulation Profile: No results for input(s): INR, PROTIME in the last 168 hours. HbA1C: No results for input(s): HGBA1C in the last 72 hours. CBG: Recent Labs  Lab 10/20/20 1700 10/20/20 1824 10/20/20 2150 10/21/20 0645 10/21/20 0749  GLUCAP 134* 181* 327* 254* 238*    Recent Results (from the past 240 hour(s))  SARS CORONAVIRUS 2 (TAT 6-24 HRS) Nasopharyngeal Nasopharyngeal Swab  Status: None   Collection Time: 10/14/20  9:35 PM   Specimen: Nasopharyngeal Swab  Result Value Ref Range Status   SARS Coronavirus 2 NEGATIVE NEGATIVE Final    Comment: (NOTE) SARS-CoV-2 target nucleic acids are NOT DETECTED.  The SARS-CoV-2 RNA is generally detectable in upper and lower respiratory specimens during the acute phase of infection. Negative results do not preclude SARS-CoV-2 infection, do not rule out co-infections with other pathogens, and should not be used as the sole basis for treatment or other patient management decisions. Negative results must be combined with clinical observations, patient history, and epidemiological information. The expected result is Negative.  Fact Sheet for Patients: HairSlick.no  Fact Sheet for Healthcare Providers: quierodirigir.com  This test is not yet approved or cleared by the [place] FDA and  has been authorized for detection and/or diagnosis of SARS-CoV-2 by FDA under an Emergency Use Authorization (EUA). This EUA will remain  in effect (meaning this test can be used) for the duration of the COVID-19 declaration under Se ction 564(b)(1) of the Act, 21 U.S.C. section 360bbb-3(b)(1), unless the authorization is terminated or revoked sooner.  Performed at [facility], [address], [place], [place] [address]   Surgical pcr screen     Status: None   Collection Time: 10/19/20  6:42 PM   Specimen:  Nasal Mucosa; Nasal Swab  Result Value Ref Range Status   MRSA, PCR NEGATIVE NEGATIVE Final   Staphylococcus aureus NEGATIVE NEGATIVE Final    Comment: (NOTE) The Xpert SA Assay (FDA approved for NASAL specimens in patients 63 years of age and older), is one component of a comprehensive surveillance program. It is not intended to diagnose infection nor to guide or monitor treatment. Performed at [facility], [address], [place], [place] [address]      Radiology Studies: DG Cervical Spine 2 or 3 views  Result Date: 10/20/2020 CLINICAL DATA:  Status post cervical fusion. C5-C7 fusion C6 central corpectomy with strut graft C5 through C7. EXAM: CERVICAL SPINE - 2-3 VIEW COMPARISON:  Preoperative radiograph 10/16/2020 FINDINGS: Anterior C5 through C7 fusion with C6 corpectomy. Straightening of normal lordosis without listhesis. Drain in place anteriorly. Stable upper cervical spine. No apical pneumothorax. IMPRESSION: Anterior C5 through C7 fusion with C6 corpectomy. No immediate postoperative complication. Electronically Signed   By: Narda Rutherford M.D.   On: 10/20/2020 19:53   DG Cervical Spine 2 or 3 views  Result Date: 10/20/2020 CLINICAL DATA:  Surgery, elective. Additional history provided: C5-C6, C6-7 anterior cervical discectomy, C6 corpectomy, C5-C7 plate, allograft. Provided fluoroscopy time 23 seconds (7.51 mGy). EXAM: CERVICAL SPINE - 2-3 VIEW; DG C-ARM 1-60 MIN COMPARISON:  Cervical spine MRI 10/16/2020. FINDINGS: PA and lateral view intraoperative fluoroscopic images of the cervical spine are submitted, 4 images total. On the provided images, ACDF hardware spans the C5-C7 levels (ventral plate with screws at C5 and C7). ET tube present with tip terminating at the level of the clavicular heads. IMPRESSION: Four intraoperative fluoroscopic images of the cervical spine, as described. Correlate with the procedural history. Electronically Signed   By: Jackey Loge DO   On:  10/20/2020 18:27   DG C-Arm 1-60 Min  Result Date: 10/20/2020 CLINICAL DATA:  Surgery, elective. Additional history provided: C5-C6, C6-7 anterior cervical discectomy, C6 corpectomy, C5-C7 plate, allograft. Provided fluoroscopy time 23 seconds (7.51 mGy). EXAM: CERVICAL SPINE - 2-3 VIEW; DG C-ARM 1-60 MIN COMPARISON:  Cervical spine MRI 10/16/2020. FINDINGS: PA and lateral view  intraoperative fluoroscopic images of the cervical spine are submitted, 4 images total. On the provided images, ACDF hardware spans the C5-C7 levels (ventral plate with screws at C5 and C7). ET tube present with tip terminating at the level of the clavicular heads. IMPRESSION: Four intraoperative fluoroscopic images of the cervical spine, as described. Correlate with the procedural history. Electronically Signed   By: Jackey Loge DO   On: 10/20/2020 18:27     Pamella Pert, MD, PhD [facility]  Between 7 am - 7 pm I am available, please contact me via Amion (for emergencies) or Securechat (non urgent messages)  Between 7 pm - 7 am I am not available, please contact night coverage MD/APP via Amion

## 2020-10-22 DIAGNOSIS — G9520 Unspecified cord compression: Secondary | ICD-10-CM | POA: Diagnosis not present

## 2020-10-22 DIAGNOSIS — E119 Type 2 diabetes mellitus without complications: Secondary | ICD-10-CM | POA: Diagnosis not present

## 2020-10-22 DIAGNOSIS — I2510 Atherosclerotic heart disease of native coronary artery without angina pectoris: Secondary | ICD-10-CM | POA: Diagnosis not present

## 2020-10-22 DIAGNOSIS — W19XXXA Unspecified fall, initial encounter: Secondary | ICD-10-CM | POA: Diagnosis not present

## 2020-10-22 LAB — GLUCOSE, CAPILLARY
Glucose-Capillary: 195 mg/dL — ABNORMAL HIGH (ref 70–99)
Glucose-Capillary: 287 mg/dL — ABNORMAL HIGH (ref 70–99)
Glucose-Capillary: 214 mg/dL — ABNORMAL HIGH (ref 70–99)
Glucose-Capillary: 231 mg/dL — ABNORMAL HIGH (ref 70–99)

## 2020-10-22 LAB — BASIC METABOLIC PANEL
Anion gap: 7 (ref 5–15)
BUN: 19 mg/dL (ref 8–23)
CO2: 30 mmol/L (ref 22–32)
Calcium: 9.5 mg/dL (ref 8.9–10.3)
Chloride: 102 mmol/L (ref 98–111)
Creatinine, Ser: 0.67 mg/dL (ref 0.44–1.00)
GFR, Estimated: >60 mL/min (ref >60)
Glucose, Bld: 220 mg/dL — ABNORMAL HIGH (ref 70–99)
Potassium: 4.1 mmol/L (ref 3.5–5.1)
Sodium: 139 mmol/L (ref 135–145)

## 2020-10-22 LAB — CBC
HCT: 32.9 % — ABNORMAL LOW (ref 36.0–46.0)
Hemoglobin: 10.2 g/dL — ABNORMAL LOW (ref 12.0–15.0)
MCH: 26.7 pg (ref 26.0–34.0)
MCHC: 31 g/dL (ref 30.0–36.0)
MCV: 86.1 fL (ref 80.0–100.0)
Platelets: 262 10*3/uL (ref 150–400)
RBC: 3.82 MIL/uL — ABNORMAL LOW (ref 3.87–5.11)
RDW: 15.1 % (ref 11.5–15.5)
WBC: 11.8 10*3/uL — ABNORMAL HIGH (ref 4.0–10.5)
nRBC: 0 % (ref 0.0–0.2)

## 2020-10-22 LAB — MAGNESIUM: Magnesium: 1.8 mg/dL (ref 1.7–2.4)

## 2020-10-22 NOTE — Progress Notes (Addendum)
     Subjective: 2 Days Post-Op Procedure(s) (LRB): C5-6, C6-7 ANTERIOR CERVICAL DISCECTOMY, C6 CORPECTOMY, C5-C7 PLATE, ALLOGRAFT (N/A) Awake, alert and oriented x 3. She wants to go home with her daughter but will await eval by CIR. Over all she made good progress with PT/OT  Patient reports pain as mild.    Objective:   VITALS:  Temp:  [98 F (36.7 C)-98.8 F (37.1 C)] 98 F (36.7 C) (05/29 0934) Pulse Rate:  [82-100] 100 (05/29 0934) Resp:  [16-18] 18 (05/29 0934) BP: (117-126)/(59-81) 124/81 (05/29 0934) SpO2:  [95 %-97 %] 97 % (05/29 0934)  Neurologically intact ABD soft Neurovascular intact Sensation intact distally Intact pulses distally Dorsiflexion/Plantar flexion intact Incision: dressing C/D/I, no drainage and Hemovac removed and dressing changed.   LABS Recent Labs    10/20/20 0034 10/21/20 0123 10/22/20 0434  HGB 13.6 10.8* 10.2*  WBC 11.9* 11.1* 11.8*  PLT 296 238 262   Recent Labs    10/21/20 0123 10/22/20 0434  NA 134* 139  K 4.7 4.1  CL 98 102  CO2 26 30  BUN 19 19  CREATININE 0.75 0.67  GLUCOSE 329* 220*   No results for input(s): LABPT, INR in the last 72 hours.   Assessment/Plan: 2 Days Post-Op Procedure(s) (LRB): C5-6, C6-7 ANTERIOR CERVICAL DISCECTOMY, C6 CORPECTOMY, C5-C7 PLATE, ALLOGRAFT (N/A)  Advance diet Up with therapy D/C IV fluids  Will have CIR assess but as she makes progress she may be able to  Be more independent.  [name] 10/22/2020, 12:38 PMPatient ID: [name], female   DOB: Feb 17, 1956, 65 y.o.   MRN: [id number]

## 2020-10-22 NOTE — Progress Notes (Signed)
PROGRESS NOTE  [name] YIR:[id number] DOB: 02-01-1956 DOA: 10/14/2020 PCP: Courtney Paris, NP   LOS: 8 days   Brief Narrative / Interim history: 65 year old woman with diabetes, CAD, OA, comes into the hospital with multiple falls at home, right leg weakness.  She is also been complaining of decreased sensation in her hands.  Lumbar spine MRI showed multilevel lumbar spondylosis L1-2 and L4-5, moderate canal stenosis and severe bilateral foraminal stenosis L4-5 L5-S1.  C-spine MRI was done due to hand involvement which showed severe spinal stenosis and cord compression at C5-6 level possibly C6-7.  Orthopedic surgery consulted  Subjective / 24h Interval events: No complaints.  Feels like her hands are less numb.  Was able to work with physical therapy yesterday  Assessment & Plan: Principal Problem Severe spinal stenosis and cord compression C5-6, C6-7 -orthopedic surgery consulted, she has been transferred from [facility] hospital to [facility] and she is status post C 5-6, C6-7 anterior cervical discectomy, C6 corpectomy, C5-7 plate allograft -Doing well postoperatively, PT recommending CIR, awaiting consult  Active Problems Low back pain-chronic, moderate canal stenosis and severe bilateral foraminal stenosis L4-5, L5-S1.  Continue conservative management  PVCs, questionable bradycardia-patient with frequent PVCs, received magnesium last night, potassium normal this morning.  She is asymptomatic, will check a 2D echo.  If echo is normal this can be followed up as an outpatient.  Since on telemetry she did not have any significant bradycardic episodes.  Awaiting echo today  Polypharmacy-recommend further addressed as an outpatient  CAD-asymptomatic  DM 2, poorly controlled with hyperglycemia-A1c 8.2.  Continue Levemir, sliding scale  CBG (last 3)  Recent Labs    10/21/20 1617 10/21/20 1943 10/22/20 0648  GLUCAP 293* 211* 195*   Scheduled Meds: . amitriptyline  10 mg Oral  QHS  . ARIPiprazole  5 mg Oral Daily  . docusate sodium  100 mg Oral BID  . fenofibrate  54 mg Oral Daily  . gabapentin  800 mg Oral TID  . insulin aspart  0-15 Units Subcutaneous TID WC  . insulin aspart  0-5 Units Subcutaneous QHS  . insulin aspart  4 Units Subcutaneous TID WC  . insulin detemir  24 Units Subcutaneous QHS  . magnesium gluconate  500 mg Oral QHS  . oxyCODONE  10 mg Oral Q12H  . pantoprazole  40 mg Oral QHS  . pravastatin  80 mg Oral Daily  . sertraline  100 mg Oral QHS  . vitamin B-12  1,000 mcg Oral Daily   Continuous Infusions: . methocarbamol (ROBAXIN) IV     PRN Meds:.albuterol, albuterol, alum & mag hydroxide-simeth, bisacodyl, menthol-cetylpyridinium **OR** phenol, methocarbamol **OR** methocarbamol (ROBAXIN) IV, morphine injection, ondansetron **OR** ondansetron (ZOFRAN) IV, oxyCODONE, polyethylene glycol  Diet Orders (From admission, onward)    Start     Ordered   10/15/20 0028  Diet Carb Modified Fluid consistency: Thin; Room service appropriate? Yes  Diet effective now       Question Answer Comment  Diet-HS Snack? Nothing   Calorie Level Medium 1600-2000   Fluid consistency: Thin   Room service appropriate? Yes      10/15/20 0027          DVT prophylaxis:      Code Status: Full Code  Family Communication: No family at bedside  Status is: Inpatient  Remains inpatient appropriate because:Inpatient level of care appropriate due to severity of illness   Dispo: The patient is from: Home  Anticipated d/c is to: TBD              Patient currently is not medically stable to d/c.   Difficult to place patient No   Level of care: Med-Surg  Consultants:  Orthopedic surgery  Procedures:  None  Microbiology  None  Antimicrobials: None   Objective: Vitals:   10/21/20 1524 10/21/20 2100 10/22/20 0500 10/22/20 0934  BP: 117/63 (!) 126/59 120/62 124/81  Pulse: 90 90 82 100  Resp: 17 16  18   Temp: 98.5 F (36.9 C) 98.8  F (37.1 C) 98 F (36.7 C) 98 F (36.7 C)  TempSrc: Oral Oral Oral   SpO2: 95% 95% 96% 97%  Weight:      Height:        Intake/Output Summary (Last 24 hours) at 10/22/2020 1049 Last data filed at 10/21/2020 2300 Gross per 24 hour  Intake 626.59 ml  Output 850 ml  Net -223.41 ml   Filed Weights   10/14/20 1513 10/20/20 1215  Weight: 75 kg 74.4 kg    Examination:  Constitutional: NAD Eyes: No icterus ENMT: mmm Neck: normal, supple Respiratory: CTA bilaterally, no wheezing or crackles Cardiovascular: Regular rate and rhythm, no murmurs Abdomen: Soft, NT, ND, bowel sounds positive Musculoskeletal: no clubbing / cyanosis.  Skin: No rashes seen   Data Reviewed: I have independently reviewed following labs and imaging studies   CBC: Recent Labs  Lab 10/20/20 0034 10/21/20 0123 10/22/20 0434  WBC 11.9* 11.1* 11.8*  HGB 13.6 10.8* 10.2*  HCT 43.4 35.0* 32.9*  MCV 85.4 87.1 86.1  PLT 296 238 262   Basic Metabolic Panel: Recent Labs  Lab 10/15/20 1401 10/16/20 0241 10/20/20 0034 10/21/20 0123 10/22/20 0434  NA  --  141 138 134* 139  K  --  4.7 4.6 4.7 4.1  CL  --  106 96* 98 102  CO2  --  30 33* 26 30  GLUCOSE  --  145* 135* 329* 220*  BUN  --  17 27* 19 19  CREATININE  --  0.79 0.99 0.75 0.67  CALCIUM  --  9.3 10.9* 9.2 9.5  MG 1.7 1.8  --   --  1.8   Liver Function Tests: No results for input(s): AST, ALT, ALKPHOS, BILITOT, PROT, ALBUMIN in the last 168 hours. Coagulation Profile: No results for input(s): INR, PROTIME in the last 168 hours. HbA1C: No results for input(s): HGBA1C in the last 72 hours. CBG: Recent Labs  Lab 10/21/20 0749 10/21/20 1141 10/21/20 1617 10/21/20 1943 10/22/20 0648  GLUCAP 238* 284* 293* 211* 195*    Recent Results (from the past 240 hour(s))  SARS CORONAVIRUS 2 (TAT 6-24 HRS) Nasopharyngeal Nasopharyngeal Swab     Status: None   Collection Time: 10/14/20  9:35 PM   Specimen: Nasopharyngeal Swab  Result Value Ref  Range Status   SARS Coronavirus 2 NEGATIVE NEGATIVE Final    Comment: (NOTE) SARS-CoV-2 target nucleic acids are NOT DETECTED.  The SARS-CoV-2 RNA is generally detectable in upper and lower respiratory specimens during the acute phase of infection. Negative results do not preclude SARS-CoV-2 infection, do not rule out co-infections with other pathogens, and should not be used as the sole basis for treatment or other patient management decisions. Negative results must be combined with clinical observations, patient history, and epidemiological information. The expected result is Negative.  Fact Sheet for Patients: 10/16/20  Fact Sheet for Healthcare Providers: HairSlick.no  This test is not yet approved or  cleared by the [place] and  has been authorized for detection and/or diagnosis of SARS-CoV-2 by FDA under an Emergency Use Authorization (EUA). This EUA will remain  in effect (meaning this test can be used) for the duration of the COVID-19 declaration under Se ction 564(b)(1) of the Act, 21 U.S.C. section 360bbb-3(b)(1), unless the authorization is terminated or revoked sooner.  Performed at [facility], [address] [place], [place] [address]   Surgical pcr screen     Status: None   Collection Time: 10/19/20  6:42 PM   Specimen: Nasal Mucosa; Nasal Swab  Result Value Ref Range Status   MRSA, PCR NEGATIVE NEGATIVE Final   Staphylococcus aureus NEGATIVE NEGATIVE Final    Comment: (NOTE) The Xpert SA Assay (FDA approved for NASAL specimens in patients 3 years of age and older), is one component of a comprehensive surveillance program. It is not intended to diagnose infection nor to guide or monitor treatment. Performed at [facility], [address], [place], [place] [address]      Radiology Studies: No results found.   Pamella Pert, MD, PhD [facility]  Between 7  am - 7 pm I am available, please contact me via Amion (for emergencies) or Securechat (non urgent messages)  Between 7 pm - 7 am I am not available, please contact night coverage MD/APP via Amion

## 2020-10-22 NOTE — Progress Notes (Signed)
Physical Therapy Treatment Patient Details Name: [name] MRN: [id number] DOB: December 26, 1955 Today's Date: 10/22/2020    History of Present Illness Pt is a 65 y/o female who presents with multiple falls at home. She is now s/p C5-C7 ACDF on 10/20/2020. 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    PT Comments    Pt progressing well with post-op mobility. She was able to demonstrate transfers and ambulation with gross min assist and RW for support. Pt was encouraged by how far she was able to ambulate today, stating it is the farthest she has gone (~75' total). Pt was educated on precautions, brace application/wearing schedule, appropriate activity progression, and car transfer. Will continue to follow.      Follow Up Recommendations  CIR     Equipment Recommendations  None recommended by PT    Recommendations for Other Services Rehab consult     Precautions / Restrictions Precautions Precautions: Fall Required Braces or Orthoses: Cervical Brace Cervical Brace: Hard collar;At all times Restrictions Weight Bearing Restrictions: No    Mobility  Bed Mobility Overal bed mobility: Needs Assistance Bed Mobility: Rolling;Sidelying to Sit Rolling: Supervision Sidelying to sit: Min assist       General bed mobility comments: VC's for optimal log roll technique. Pt was able to transition to EOB with close supervision for safety with min assist at the very end for full trunk elevation to full sitting position.    Transfers Overall transfer level: Needs assistance Equipment used: Rolling walker (2 wheeled) Transfers: Sit to/from Stand Sit to Stand: Min assist         General transfer comment: Steadying assist as pt powered up to full standing position. VC's for hand placement on seated surface for safety.  Ambulation/Gait Ambulation/Gait  assistance: Min assist Gait Distance (Feet): 75 Feet Assistive device: Rolling walker (2 wheeled)   Gait velocity: Decreased Gait velocity interpretation: <1.31 ft/sec, indicative of household ambulator General Gait Details: Slow, guarded and unsteady. Hands on assist at the gait belt for support as pt ambulated in the hall. Pt motivated for distance but able to self monitor for when she needed to turn around to return to the room.   Stairs             Wheelchair Mobility    Modified Rankin (Stroke Patients Only)       Balance Overall balance assessment: Needs assistance Sitting-balance support: No upper extremity supported;Feet supported Sitting balance-Leahy Scale: Fair       Standing balance-Leahy Scale: Poor Standing balance comment: heavily reliant on UEs                            Cognition Arousal/Alertness: Awake/alert Behavior During Therapy: WFL for tasks assessed/performed Overall Cognitive Status: Within Functional Limits for tasks assessed                                        Exercises      General Comments        Pertinent Vitals/Pain Pain Assessment: Faces Faces Pain Scale: Hurts little more Pain Location: cervical area Pain Descriptors / Indicators: Aching Pain Intervention(s): Limited activity within patient's tolerance;Monitored during session;Repositioned    Home Living Family/patient expects to be discharged to:: Private residence Living Arrangements:  Children Available Help at Discharge: Family;Available 24 hours/day Type of Home: Mobile home Home Access: Stairs to enter Entrance Stairs-Rails: Right Home Layout: One level Home Equipment: Cane - single point;Bedside commode;Walker - 2 wheels Additional Comments: Pt will be assisted by children and grandchildren    Prior Function Level of Independence:  (Pt was starting to decline and had multiple falls)      Comments: Prior to surgery she was  transferign with the steady to a chair only. At home she was ambualted limited distance and having frequent falls with walker.   PT Goals (current goals can now be found in the care plan section) Acute Rehab PT Goals Patient Stated Goal: Be able to go home PT Goal Formulation: With patient Time For Goal Achievement: 10/30/20 Potential to Achieve Goals: Good Progress towards PT goals: Progressing toward goals    Frequency    Min 5X/week      PT Plan Frequency needs to be updated    Co-evaluation              AM-PAC PT "6 Clicks" Mobility   Outcome Measure  Help needed turning from your back to your side while in a flat bed without using bedrails?: A Little Help needed moving from lying on your back to sitting on the side of a flat bed without using bedrails?: A Little Help needed moving to and from a bed to a chair (including a wheelchair)?: A Little Help needed standing up from a chair using your arms (e.g., wheelchair or bedside chair)?: A Little Help needed to walk in hospital room?: A Little Help needed climbing 3-5 steps with a railing? : A Lot 6 Click Score: 17    End of Session Equipment Utilized During Treatment: Gait belt Activity Tolerance: Patient tolerated treatment well Patient left: in chair;with call bell/phone within reach;with chair alarm set Nurse Communication: Mobility status PT Visit Diagnosis: Other abnormalities of gait and mobility (R26.89);Muscle weakness (generalized) (M62.81)     Time: 2197-5883 PT Time Calculation (min) (ACUTE ONLY): 21 min  Charges:  $Gait Training: 8-22 mins                     Conni Slipper, PT, DPT Acute Rehabilitation Services Pager: [phone] Office: [phone]    Marylynn Pearson 10/22/2020, 1:49 PM

## 2020-10-23 ENCOUNTER — Inpatient Hospital Stay (HOSPITAL_COMMUNITY): Payer: Medicare Other

## 2020-10-23 ENCOUNTER — Other Ambulatory Visit (HOSPITAL_COMMUNITY): Payer: Medicare Other

## 2020-10-23 DIAGNOSIS — I2510 Atherosclerotic heart disease of native coronary artery without angina pectoris: Secondary | ICD-10-CM | POA: Diagnosis not present

## 2020-10-23 DIAGNOSIS — W19XXXA Unspecified fall, initial encounter: Secondary | ICD-10-CM | POA: Diagnosis not present

## 2020-10-23 DIAGNOSIS — E119 Type 2 diabetes mellitus without complications: Secondary | ICD-10-CM | POA: Diagnosis not present

## 2020-10-23 DIAGNOSIS — R9431 Abnormal electrocardiogram [ECG] [EKG]: Secondary | ICD-10-CM

## 2020-10-23 DIAGNOSIS — M4802 Spinal stenosis, cervical region: Secondary | ICD-10-CM | POA: Diagnosis not present

## 2020-10-23 DIAGNOSIS — G9520 Unspecified cord compression: Secondary | ICD-10-CM | POA: Diagnosis not present

## 2020-10-23 LAB — ECHOCARDIOGRAM COMPLETE
AR max vel: 1.47 cm2
AV Area VTI: 1.6 cm2
AV Area mean vel: 1.57 cm2
AV Mean grad: 8 mmHg
AV Peak grad: 17.5 mmHg
Ao pk vel: 2.09 m/s
Area-P 1/2: 3.51 cm2
Height: 63 in
S' Lateral: 3.1 cm
Weight: 2624 oz

## 2020-10-23 LAB — GLUCOSE, CAPILLARY
Glucose-Capillary: 125 mg/dL — ABNORMAL HIGH (ref 70–99)
Glucose-Capillary: 241 mg/dL — ABNORMAL HIGH (ref 70–99)
Glucose-Capillary: 171 mg/dL — ABNORMAL HIGH (ref 70–99)
Glucose-Capillary: 167 mg/dL — ABNORMAL HIGH (ref 70–99)

## 2020-10-23 MED ORDER — LIP MEDEX EX OINT
TOPICAL_OINTMENT | CUTANEOUS | Status: DC | PRN
  Filled 2020-10-23: qty 7

## 2020-10-23 NOTE — Progress Notes (Signed)
Inpatient Rehab Admissions Coordinator:   I do not have insurance auth or a bed for this Pt. On CIR today. MD consult planned for today. Pt. Updated. I will continue to follow for potential admit pending bed availability and insurance auth.   Megan Salon, MS, CCC-SLP Rehab Admissions Coordinator  [phone] (celll) [phone] (office)

## 2020-10-23 NOTE — Progress Notes (Signed)
  Echocardiogram 2D Echocardiogram has been performed.  Roosvelt Maser F 10/23/2020, 2:32 PM

## 2020-10-23 NOTE — Progress Notes (Signed)
Physical Therapy Treatment Patient Details Name: [name] MRN: [id number] DOB: June 05, 1955 Today's Date: 10/23/2020    History of Present Illness Pt is a 65 y/o female who presents with multiple falls at home. She is now s/p C5-C7 ACDF on 10/20/2020. 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    PT Comments    Pt progressing slowly towards goals. Pt with L ankle pain today from "wearing compression stockings too long". Pt remains unsteady and shaky when in standing and during ambulation. Pt at increased falls risk. Pt with noted fine motor deficits in bilat hands when donning socks at EOB. Pt to benefit from CIR upon d/c to address mentioned deficits and achieve maximal functional recovery.    Follow Up Recommendations  CIR     Equipment Recommendations  None recommended by PT    Recommendations for Other Services Rehab consult     Precautions / Restrictions Precautions Precautions: Cervical;Fall Precaution Booklet Issued: No Precaution Comments: pt re-educated on cervical precautions, pt only able to recall 1/3 but answered questions appropriately Required Braces or Orthoses: Cervical Brace Cervical Brace: Hard collar;At all times Restrictions Weight Bearing Restrictions: No Other Position/Activity Restrictions: lifting restrictions <5 pounds    Mobility  Bed Mobility Overal bed mobility: Needs Assistance Bed Mobility: Rolling;Sidelying to Sit Rolling: Supervision Sidelying to sit: Min assist       General bed mobility comments: verbal cues for log roll technique, definite use of bed rail, increased time, minA for trunk elevation    Transfers Overall transfer level: Needs assistance Equipment used: Rolling walker (2 wheeled) Transfers: Sit to/from Stand Sit to Stand: Min assist         General transfer comment: increased  time, minA to power up and steady during transition of hands from bed to RW  Ambulation/Gait Ambulation/Gait assistance: Min assist Gait Distance (Feet): 80 Feet Assistive device: Rolling walker (2 wheeled) Gait Pattern/deviations: Step-through pattern;Decreased stride length;Narrow base of support Gait velocity: decreased Gait velocity interpretation: <1.31 ft/sec, indicative of household ambulator General Gait Details: pt with decreased step height, narrow base of support, shaky, minA for walker management, pt c/o L ankle pain from compression stockings also contributing to instability   Stairs             Wheelchair Mobility    Modified Rankin (Stroke Patients Only)       Balance Overall balance assessment: Needs assistance Sitting-balance support: No upper extremity supported;Feet supported Sitting balance-Leahy Scale: Fair Sitting balance - Comments: pt able to don socks sitting EOB, increased posterior lean but no LOB   Standing balance support: Bilateral upper extremity supported;During functional activity Standing balance-Leahy Scale: Poor Standing balance comment: reliant on RW                            Cognition Arousal/Alertness: Awake/alert Behavior During Therapy: WFL for tasks assessed/performed Overall Cognitive Status: Within Functional Limits for tasks assessed                                        Exercises      General Comments General comments (skin integrity, edema, etc.): dressing intact      Pertinent Vitals/Pain Pain Assessment: Faces Faces Pain Scale: Hurts a little bit Pain Location: surgical site  Pain Descriptors / Indicators: Discomfort Pain Intervention(s): Monitored during session    Home Living                      Prior Function            PT Goals (current goals can now be found in the care plan section) Progress towards PT goals: Progressing toward goals    Frequency    Min  5X/week      PT Plan Frequency needs to be updated    Co-evaluation              AM-PAC PT "6 Clicks" Mobility   Outcome Measure  Help needed turning from your back to your side while in a flat bed without using bedrails?: A Little Help needed moving from lying on your back to sitting on the side of a flat bed without using bedrails?: A Little Help needed moving to and from a bed to a chair (including a wheelchair)?: A Little Help needed standing up from a chair using your arms (e.g., wheelchair or bedside chair)?: A Little Help needed to walk in hospital room?: A Little Help needed climbing 3-5 steps with a railing? : A Lot 6 Click Score: 17    End of Session Equipment Utilized During Treatment: Gait belt Activity Tolerance: Patient tolerated treatment well Patient left: in chair;with call bell/phone within reach;with chair alarm set Nurse Communication: Mobility status PT Visit Diagnosis: Other abnormalities of gait and mobility (R26.89);Muscle weakness (generalized) (M62.81)     Time: 1257-1310 PT Time Calculation (min) (ACUTE ONLY): 13 min  Charges:  $Gait Training: 8-22 mins                     [name], PT, DPT Acute Rehabilitation Services Pager #: [phone] Office #: [phone]    Iona Hansen 10/23/2020, 2:49 PM

## 2020-10-23 NOTE — Progress Notes (Signed)
Occupational Therapy Treatment Patient Details Name: SAPHIRA MESSANA MRN: [id number] DOB: 1955/12/11 Today's Date: 10/23/2020    History of present illness Pt is a 65 y/o female who presents with multiple falls at home. She is now s/p C5-C7 ACDF on 10/20/2020. 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   OT comments  Britzel is progressing well. Session focused on compensatory techniques, education and ADLs. Pt was set up level for all upper body ADLs while sitting EOB. Pt required frequent cueing to maintain cervical precautions while completing lower body ADLs and was mod A. Pt required close min guard for sit<>stand with rw, however during stand pivot transfer she experienced LOB and needed min A to right her balance. Pt complaining of L ankle pain throughout session, ankle was without skin integrity issues. Pt continues to benefit from acute OT to progress indep in ADls and mobility. D/c rec remains appropriate.   Follow Up Recommendations  CIR;Supervision - Intermittent    Equipment Recommendations  Other (comment) (defer to next venue)       Precautions / Restrictions Precautions Precautions: Cervical;Fall Precaution Booklet Issued: No Precaution Comments: Re-educated on cervical precautions, demonstrated good understanding during ADLs Required Braces or Orthoses: Cervical Brace Cervical Brace: Hard collar;At all times Restrictions Weight Bearing Restrictions: No Other Position/Activity Restrictions: lifting restrictions <5 pounds       Mobility Bed Mobility Overal bed mobility: Needs Assistance Bed Mobility: Rolling;Sidelying to Sit Rolling: Supervision Sidelying to sit: Min assist       General bed mobility comments: verbal cues for log roll, assist for trunk elevation    Transfers Overall transfer level: Needs assistance Equipment  used: Rolling walker (2 wheeled) Transfers: Sit to/from [employer] Sit to Stand: Min assist         General transfer comment: pt with c/o ankle pain and experienced LOB while stand-pivot to chair, min A to right balance    Balance Overall balance assessment: Needs assistance Sitting-balance support: No upper extremity supported;Feet supported Sitting balance-Leahy Scale: Fair Sitting balance - Comments: pt able to don socks sitting EOB, increased posterior lean but no LOB   Standing balance support: Bilateral upper extremity supported;During functional activity;Single extremity supported Standing balance-Leahy Scale: Poor Standing balance comment: Pt able to balance in standing with 1 UE on RW, while the other completed functional tasks            ADL either performed or assessed with clinical judgement   ADL Overall ADL's : Needs assistance/impaired     Grooming: Wash/dry hands;Wash/dry face;Set up;Sitting   Upper Body Bathing: Set up;Sitting   Lower Body Bathing: Sit to/from stand;Moderate assistance   Upper Body Dressing : Set up;Sitting         Functional mobility during ADLs: Min guard;Cueing for safety;Rolling walker General ADL Comments: ADLs completed sitting EOB with sit<>stand fro lower body completion, demonstrated good compensatory techniques for lower body dressing               Cognition Arousal/Alertness: Awake/alert Behavior During Therapy: WFL for tasks assessed/performed Overall Cognitive Status: Within Functional Limits for tasks assessed                     General Comments cervical collar donned, dressing dry and intact, pt complains of L ankle pain    Pertinent Vitals/ Pain       Pain Assessment: Faces Faces  Pain Scale: Hurts a little bit Pain Location: surgical site Pain Descriptors / Indicators: Discomfort Pain Intervention(s): Monitored during session         Frequency  Min 2X/week        Progress  Toward Goals  OT Goals(current goals can now be found in the care plan section)  Progress towards OT goals: Progressing toward goals  Acute Rehab OT Goals Patient Stated Goal: Be able to go home OT Goal Formulation: With patient Time For Goal Achievement: 11/04/20 Potential to Achieve Goals: Good ADL Goals Pt Will Perform Upper Body Bathing: with modified independence;sitting Pt Will Perform Lower Body Bathing: with modified independence;sit to/from stand Pt Will Transfer to Toilet: with modified independence;ambulating Pt Will Perform Tub/Shower Transfer: with modified independence;shower seat;ambulating  Plan Discharge plan remains appropriate       AM-PAC OT "6 Clicks" Daily Activity     Outcome Measure   Help from another person eating meals?: None Help from another person taking care of personal grooming?: A Little Help from another person toileting, which includes using toliet, bedpan, or urinal?: A Little Help from another person bathing (including washing, rinsing, drying)?: A Lot Help from another person to put on and taking off regular upper body clothing?: A Little Help from another person to put on and taking off regular lower body clothing?: A Lot 6 Click Score: 17    End of Session Equipment Utilized During Treatment: Gait belt;Rolling walker  OT Visit Diagnosis: Unsteadiness on feet (R26.81);Other abnormalities of gait and mobility (R26.89);Repeated falls (R29.6);History of falling (Z91.81);Muscle weakness (generalized) (M62.81);Pain Pain - Right/Left: Left Pain - part of body: Ankle and joints of foot   Activity Tolerance Patient tolerated treatment well   Patient Left in chair;with call bell/phone within reach;with chair alarm set   Nurse Communication Mobility status;Other (comment) (ankle pain)        Time: 6045-4098 OT Time Calculation (min): 22 min  Charges: OT General Charges $OT Visit: 1 Visit OT Treatments $Self Care/Home Management : 8-22  mins    Phyliss Hulick A Floreine Kingdon 10/23/2020, 4:37 PM

## 2020-10-23 NOTE — Progress Notes (Signed)
PROGRESS NOTE  [name] DDU:[id number] DOB: 07-23-1955 DOA: 10/14/2020 PCP: Courtney Paris, NP   LOS: 9 days   Brief Narrative / Interim history: 65 year old woman with diabetes, CAD, OA, comes into the hospital with multiple falls at home, right leg weakness.  She is also been complaining of decreased sensation in her hands.  Lumbar spine MRI showed multilevel lumbar spondylosis L1-2 and L4-5, moderate canal stenosis and severe bilateral foraminal stenosis L4-5 L5-S1.  C-spine MRI was done due to hand involvement which showed severe spinal stenosis and cord compression at C5-6 level possibly C6-7.  Orthopedic surgery consulted  Subjective / 24h Interval events: Feels better, less numbness in her hands.  No chest pain, no shortness of breath.  Assessment & Plan: Principal Problem Severe spinal stenosis and cord compression C5-6, C6-7 -orthopedic surgery consulted, she has been transferred from [facility] long hospital to [facility] and she is status post C 5-6, C6-7 anterior cervical discectomy, C6 corpectomy, C5-7 plate allograft -Doing well postoperatively, PT recommending CIR, rehab MD consult and insurance authorization pending  Active Problems Low back pain-chronic, moderate canal stenosis and severe bilateral foraminal stenosis L4-5, L5-S1.  Continue conservative management  PVCs, questionable bradycardia-patient with frequent PVCs, received magnesium last night, potassium normal this morning.  She is asymptomatic, will check a 2D echo.  If echo is normal this can be followed up as an outpatient.  Since on telemetry she did not have any significant bradycardic episodes.  Echo pending  Polypharmacy-recommend further addressed as an outpatient  CAD-asymptomatic  DM 2, poorly controlled with hyperglycemia-A1c 8.2.  Continue Levemir, sliding scale  CBG (last 3)  Recent Labs    10/22/20 1641 10/22/20 2108 10/23/20 0614  GLUCAP 214* 231* 125*   Scheduled Meds: . amitriptyline  10  mg Oral QHS  . ARIPiprazole  5 mg Oral Daily  . docusate sodium  100 mg Oral BID  . fenofibrate  54 mg Oral Daily  . gabapentin  800 mg Oral TID  . insulin aspart  0-15 Units Subcutaneous TID WC  . insulin aspart  0-5 Units Subcutaneous QHS  . insulin aspart  4 Units Subcutaneous TID WC  . insulin detemir  24 Units Subcutaneous QHS  . magnesium gluconate  500 mg Oral QHS  . oxyCODONE  10 mg Oral Q12H  . pantoprazole  40 mg Oral QHS  . pravastatin  80 mg Oral Daily  . sertraline  100 mg Oral QHS  . vitamin B-12  1,000 mcg Oral Daily   Continuous Infusions: . methocarbamol (ROBAXIN) IV     PRN Meds:.albuterol, albuterol, alum & mag hydroxide-simeth, bisacodyl, menthol-cetylpyridinium **OR** phenol, methocarbamol **OR** methocarbamol (ROBAXIN) IV, morphine injection, ondansetron **OR** ondansetron (ZOFRAN) IV, oxyCODONE, polyethylene glycol  Diet Orders (From admission, onward)    Start     Ordered   10/15/20 0028  Diet Carb Modified Fluid consistency: Thin; Room service appropriate? Yes  Diet effective now       Question Answer Comment  Diet-HS Snack? Nothing   Calorie Level Medium 1600-2000   Fluid consistency: Thin   Room service appropriate? Yes      10/15/20 0027          DVT prophylaxis:      Code Status: Full Code  Family Communication: No family at bedside  Status is: Inpatient  Remains inpatient appropriate because:Inpatient level of care appropriate due to severity of illness   Dispo: The patient is from: Home  Anticipated d/c is to: TBD              Patient currently is not medically stable to d/c.   Difficult to place patient No   Level of care: Med-Surg  Consultants:  Orthopedic surgery  Procedures:  None  Microbiology  None  Antimicrobials: None   Objective: Vitals:   10/22/20 1500 10/22/20 2157 10/23/20 0352 10/23/20 0751  BP: (!) 114/48 102/68 116/60 (!) 115/54  Pulse: 91 83 81 (!) 53  Resp:  18 16 17   Temp: 98.6 F  (37 C) 97.7 F (36.5 C) 97.7 F (36.5 C) 98.1 F (36.7 C)  TempSrc: Oral Oral Oral Oral  SpO2: 97% 95% 95% 96%  Weight:      Height:       No intake or output data in the 24 hours ending 10/23/20 1103 Filed Weights   10/14/20 1513 10/20/20 1215  Weight: 75 kg 74.4 kg    Examination:  Constitutional: No distress Eyes: No scleral icterus ENMT: mmm Neck: normal, supple Respiratory: Clear bilaterally, no wheezing, no crackles Cardiovascular: Regular rate and rhythm, no murmurs Abdomen: Soft, NT, ND, positive bowel sounds Musculoskeletal: no clubbing / cyanosis.  Skin: No rashes seen Neuro: Nonfocal, equal strength   Data Reviewed: I have independently reviewed following labs and imaging studies   CBC: Recent Labs  Lab 10/20/20 0034 10/21/20 0123 10/22/20 0434  WBC 11.9* 11.1* 11.8*  HGB 13.6 10.8* 10.2*  HCT 43.4 35.0* 32.9*  MCV 85.4 87.1 86.1  PLT 296 238 262   Basic Metabolic Panel: Recent Labs  Lab 10/20/20 0034 10/21/20 0123 10/22/20 0434  NA 138 134* 139  K 4.6 4.7 4.1  CL 96* 98 102  CO2 33* 26 30  GLUCOSE 135* 329* 220*  BUN 27* 19 19  CREATININE 0.99 0.75 0.67  CALCIUM 10.9* 9.2 9.5  MG  --   --  1.8   Liver Function Tests: No results for input(s): AST, ALT, ALKPHOS, BILITOT, PROT, ALBUMIN in the last 168 hours. Coagulation Profile: No results for input(s): INR, PROTIME in the last 168 hours. HbA1C: No results for input(s): HGBA1C in the last 72 hours. CBG: Recent Labs  Lab 10/22/20 0648 10/22/20 1137 10/22/20 1641 10/22/20 2108 10/23/20 0614  GLUCAP 195* 287* 214* 231* 125*    Recent Results (from the past 240 hour(s))  SARS CORONAVIRUS 2 (TAT 6-24 HRS) Nasopharyngeal Nasopharyngeal Swab     Status: None   Collection Time: 10/14/20  9:35 PM   Specimen: Nasopharyngeal Swab  Result Value Ref Range Status   SARS Coronavirus 2 NEGATIVE NEGATIVE Final    Comment: (NOTE) SARS-CoV-2 target nucleic acids are NOT DETECTED.  The  SARS-CoV-2 RNA is generally detectable in upper and lower respiratory specimens during the acute phase of infection. Negative results do not preclude SARS-CoV-2 infection, do not rule out co-infections with other pathogens, and should not be used as the sole basis for treatment or other patient management decisions. Negative results must be combined with clinical observations, patient history, and epidemiological information. The expected result is Negative.  Fact Sheet for Patients: 10/16/20  Fact Sheet for Healthcare Providers: HairSlick.no  This test is not yet approved or cleared by the quierodirigir.com FDA and  has been authorized for detection and/or diagnosis of SARS-CoV-2 by FDA under an Emergency Use Authorization (EUA). This EUA will remain  in effect (meaning this test can be used) for the duration of the COVID-19 declaration under Se ction 564(b)(1) of the  Act, 21 U.S.C. section 360bbb-3(b)(1), unless the authorization is terminated or revoked sooner.  Performed at [facility], [address], [place], [place] [address]   Surgical pcr screen     Status: None   Collection Time: 10/19/20  6:42 PM   Specimen: Nasal Mucosa; Nasal Swab  Result Value Ref Range Status   MRSA, PCR NEGATIVE NEGATIVE Final   Staphylococcus aureus NEGATIVE NEGATIVE Final    Comment: (NOTE) The Xpert SA Assay (FDA approved for NASAL specimens in patients 75 years of age and older), is one component of a comprehensive surveillance program. It is not intended to diagnose infection nor to guide or monitor treatment. Performed at [facility], [address], [place], [place] [address]      Radiology Studies: No results found.   Pamella Pert, MD, PhD [facility]  Between 7 am - 7 pm I am available, please contact me via Amion (for emergencies) or Securechat (non urgent messages)  Between 7 pm - 7 am I am  not available, please contact night coverage MD/APP via Amion

## 2020-10-23 NOTE — Consult Note (Signed)
Physical Medicine and Rehabilitation Consult  Reason for Consult: Functional deficits due to cervical myelopathy Referring Physician: Dr. Louanne Skye   HPI: [name] is a 65 y.o. female with history of MI, T2DM, depression, chronic pain, B-TKR --last in Jan 2022 with progressive weakness, frequent falls as well as shuffling gait. She ws admitted on 10/14/20 with fall and inabilty to get up and was found to have R> L sided weakness. MRI lumbar spine done showing multilevel spondylosis with moderate canal stenosis L4/5 and L5/S1 and conservative care recommended by Dr. Venetia Constable as symptoms not felt to be related to this.  She was hypotensive at admission with reports of tingling and numbness BUE. Dr. Lorin Mercy consulted for second opinion due to reports of progressive gait decline,  MRI C spine done ordered on 05/23 showing   severe bilateral C4-C7 foraminal stenosis, degenerative spondylosis  C5/6 and C6/7 with severe stenosis and cord compression worse at C5/6 with myelopathy. She was evaluated by Dr. Flavia Shipper and underwent ACDF C5-C7 on 10/20/20. Post op showing improvement with therapy evaluations showing unsteady gait. CIR recommended due to functional decline.   Pt had BM yesterday but required an enema to go- voiding well.  Pain meds usually last 6 hours- has to keep on top of it to keep pain under control.   Lives with daughter and 3 grandkids 23-12 yrs old- daughter is unemployed and can help pt when she goes home.  Admits wasn't real active prior to admission, esp since L TKR in 1/22.    Review of Systems  Constitutional: Negative for chills and fever.  HENT: Negative for hearing loss and tinnitus.   Eyes: Negative for blurred vision and double vision.  Respiratory: Negative for cough and shortness of breath.   Cardiovascular: Negative for chest pain, palpitations and leg swelling.  Gastrointestinal: Positive for constipation. Negative for heartburn and nausea.  Genitourinary:  Negative for dysuria and urgency.  Musculoskeletal: Positive for falls and myalgias.  Skin: Negative for rash.  Neurological: Positive for dizziness (when she first get up), speech change and weakness. Negative for headaches.  Psychiatric/Behavioral: Negative for memory loss.  All other systems reviewed and are negative.     Past Medical History:  Diagnosis Date  . Anxiety   . Arthritis   . Asthma   . Depression   . Dysrhythmia   . History of GI bleed    2010--- UPPER ESOPHAGITIS/ DUODERAL ULCER/ EROSION  . History of hiatal hernia   . History of kidney stones   . History of shingles    2013  . Myocardial infarction ([place]) 10 years ago   . Nephrolithiasis    BILATERAL  . Right ureteral stone   . Type 2 diabetes, diet controlled ([place])   . Urgency of urination     Past Surgical History:  Procedure Laterality Date  . CARDIAC CATHETERIZATION  02-10-2009  DR BENSIMHON   NORMAL CORONARIES/  LOW NORMAL LVF WITHOUT WALL MOTION ABNORMALITIES  . CARDIAC CATHETERIZATION  11-21-2010  DR Einar Gip   NORMAL CORONARIES/  EF 50-55%  . CHOLECYSTECTOMY  1990s   back in [place]   . CYSTOSCOPY W/ URETERAL STENT PLACEMENT Right 09/04/2013   Procedure: CYSTOSCOPY WITH RETROGRADE PYELOGRAM/URETERAL STENT PLACEMENT;  Surgeon: Molli Hazard, MD;  Location: [facility];  Service: Urology;  Laterality: Right;  . CYSTOSCOPY WITH RETROGRADE PYELOGRAM, URETEROSCOPY AND STENT PLACEMENT Right 09/27/2013   Procedure: CYSTOSCOPY WITH RETROGRADE PYELOGRAM, URETEROSCOPY AND STENT EXCHANGE;  Surgeon: Dennard Schaumann  Jasmine December, MD;  Location: [facility];  Service: Urology;  Laterality: Right;  . HOLMIUM LASER APPLICATION Right 05/30/9700   Procedure: HOLMIUM LASER APPLICATION;  Surgeon: Molli Hazard, MD;  Location: [facility];  Service: Urology;  Laterality: Right;  . HYSTEROSCOPY WITH D & C  01-25-2003  . IR URETERAL STENT RIGHT NEW ACCESS W/O SEP NEPHROSTOMY CATH  07/08/2017   . LAPAROSCOPIC ASSISTED VAGINAL HYSTERECTOMY  03-02-2003   W/  BILATERAL SALPINGOOPHORECTOMY  . LAPAROSCOPY  EXTENSIVE LYSIS ADHESIONS/  REDO PARAESOPHAGEAL HIATAL HERNIA WITH PRIMARY CLOSURE AND MESH/ NISSEN FUNDOPLICATION (1cm)/  REPAIR GASTROTOMY  08-22-2009  . NEPHROLITHOTOMY Right 07/08/2017   Procedure: NEPHROLITHOTOMY PERCUTANEOUS;  Surgeon: Festus Aloe, MD;  Location: [facility];  Service: Urology;  Laterality: Right;  . NISSEN FUNDOPLICATION  6378   W/  CHOLECYSTECTOMY  . SHOULDER ARTHROSCOPY WITH SUBACROMIAL DECOMPRESSION, ROTATOR CUFF REPAIR AND BICEP TENDON REPAIR Left 08-02-2010   AND LABRAL DEBRIDEMENT  . TOTAL KNEE ARTHROPLASTY Right 07-01-2006  . TOTAL KNEE ARTHROPLASTY Left 06/08/2020   Procedure: LEFT TOTAL KNEE ARTHROPLASTY;  Surgeon: Meredith Pel, MD;  Location: [facility];  Service: Orthopedics;  Laterality: Left;  . TOTAL KNEE REVISION  02/25/2012   Procedure: TOTAL KNEE REVISION;  Surgeon: Meredith Pel, MD;  Location: [facility];  Service: Orthopedics;  Laterality: Right;  Revise right total knee arthroplasty  . TUBAL LIGATION  1990  . WRIST FUSION Left 2004   RETAINED HARDWARE    Family History  Problem Relation Age of Onset  . Heart disease Father     Social History:  Lives with daughter and grand kids--friend plans on assisting past discharge. she reports that she has never smoked. She has never used smokeless tobacco. She reports that she does not drink alcohol and does not use drugs.    Allergies: No Known Allergies    Medications Prior to Admission  Medication Sig Dispense Refill  . acetaminophen (TYLENOL) 325 MG tablet Take 1-2 tablets (325-650 mg total) by mouth every 6 (six) hours as needed for mild pain (pain score 1-3 or temp > 100.5). 60 tablet 0  . albuterol (PROVENTIL HFA;VENTOLIN HFA) 108 (90 Base) MCG/ACT inhaler Inhale 2 puffs into the lungs every 4 (four) hours as needed for wheezing or shortness of breath. For shortness of breath 18 g 0   . albuterol (PROVENTIL) (2.5 MG/3ML) 0.083% nebulizer solution Take 2.5 mg by nebulization every 6 (six) hours as needed for wheezing or shortness of breath.    Marland Kitchen amitriptyline (ELAVIL) 10 MG tablet Take 10 mg by mouth at bedtime.    . ARIPiprazole (ABILIFY) 5 MG tablet Take 5 mg by mouth daily.    Marland Kitchen aspirin EC 81 MG tablet Take 1 tablet (81 mg total) by mouth daily.    . blood glucose meter kit and supplies Dispense based on patient and insurance preference. Use up to four times daily as directed. (FOR ICD-10 E10.9, E11.9). 1 each 0  . buprenorphine (BUTRANS) 10 MCG/HR PTWK Place 1 patch onto the skin once a week. Every Saturday    . etodolac (LODINE XL) 600 MG 24 hr tablet Take 600 mg by mouth daily as needed.    . fenofibrate 54 MG tablet Take 54 mg by mouth daily.    Marland Kitchen gabapentin (NEURONTIN) 800 MG tablet Take 800 mg by mouth 3 (three) times daily.    Marland Kitchen glipiZIDE (GLUCOTROL) 5 MG tablet Take 2.5 mg by mouth daily before breakfast.    .  insulin detemir (LEVEMIR FLEXTOUCH) 100 UNIT/ML FlexPen Inject 24 Units into the skin at bedtime.    Marland Kitchen JANUMET 50-1000 MG tablet Take 1 tablet by mouth 2 (two) times daily.    . Magnesium 500 MG TABS Take 500 mg by mouth at bedtime.    . metaxalone (SKELAXIN) 800 MG tablet Take 800 mg by mouth 4 (four) times daily as needed.    . Multiple Vitamins-Minerals (ADVANCED DIABETIC MULTIVITAMIN PO) Take 1 tablet by mouth daily.    Marland Kitchen oxyCODONE (OXYCONTIN) 10 mg 12 hr tablet Take 1 tablet (10 mg total) by mouth every 12 (twelve) hours. 14 tablet 0  . oxyCODONE-acetaminophen (PERCOCET) 10-325 MG tablet Take 1 tablet by mouth 4 (four) times daily as needed. For breakthrough pain. 15 tablet 0  . OZEMPIC, 1 MG/DOSE, 4 MG/3ML SOPN Inject 1 mg into the muscle every Saturday.    . pravastatin (PRAVACHOL) 80 MG tablet Take 80 mg by mouth daily.    . sertraline (ZOLOFT) 100 MG tablet Take 100 mg by mouth at bedtime.    Marland Kitchen tiZANidine (ZANAFLEX) 4 MG capsule Take 4 mg by mouth  every 8 (eight) hours as needed for muscle spasms.    . vitamin B-12 (CYANOCOBALAMIN) 1000 MCG tablet Take 1,000 mcg by mouth daily.    Marland Kitchen zolpidem (AMBIEN) 5 MG tablet Take 5 mg by mouth at bedtime as needed for sleep.      Home: Home Living Family/patient expects to be discharged to:: Private residence Living Arrangements: Children Available Help at Discharge: Family,Available 24 hours/day Type of Home: Mobile home Home Access: Stairs to enter Entrance Stairs-Number of Steps: 4 Entrance Stairs-Rails: Right Home Layout: One level Bathroom Shower/Tub: Chiropodist: Standard Bathroom Accessibility: Yes Home Equipment: [place] - single point,Bedside commode,Walker - 2 wheels Additional Comments: Pt will be assisted by children and grandchildren  Functional History: Prior Function Level of Independence:  (Pt was starting to decline and had multiple falls) Comments: Prior to surgery she was transferign with the steady to a chair only. At home she was ambualted limited distance and having frequent falls with walker. Functional Status:  Mobility: Bed Mobility Overal bed mobility: Needs Assistance Bed Mobility: Rolling,Sidelying to Sit Rolling: Supervision Sidelying to sit: Min assist Supine to sit: Min assist General bed mobility comments: VC's for optimal log roll technique. Pt was able to transition to EOB with close supervision for safety with min assist at the very end for full trunk elevation to full sitting position. Transfers Overall transfer level: Needs assistance Equipment used: Rolling walker (2 wheeled) Transfers: Sit to/from Stand Sit to Stand: Min assist Stand pivot transfers: Mod assist General transfer comment: Steadying assist as pt powered up to full standing position. VC's for hand placement on seated surface for safety. Ambulation/Gait Ambulation/Gait assistance: Min assist Gait Distance (Feet): 75 Feet Assistive device: Rolling walker (2  wheeled) General Gait Details: Slow, guarded and unsteady. Hands on assist at the gait belt for support as pt ambulated in the hall. Pt motivated for distance but able to self monitor for when she needed to turn around to return to the room. Gait velocity: Decreased Gait velocity interpretation: <1.31 ft/sec, indicative of household ambulator    ADL: ADL Overall ADL's : Needs assistance/impaired Eating/Feeding: Set up,Sitting Grooming: Wash/dry hands,Wash/dry face,Sitting,Set up Upper Body Bathing: Minimal assistance,Cueing for safety,Cueing for sequencing,Sitting Lower Body Bathing: Moderate assistance,Cueing for safety,Cueing for sequencing,Sit to/from stand Upper Body Dressing : Minimal assistance,Cueing for safety,Cueing for sequencing,Sitting Lower Body Dressing: Moderate  assistance,Cueing for safety,Cueing for sequencing,Sit to/from stand Toilet Transfer: Min guard,Cueing for safety,Cueing for sequencing Toileting- Water quality scientist and Hygiene: Sit to/from stand,Minimal assistance Functional mobility during ADLs: Min guard,Cueing for safety,Cueing for sequencing,Rolling walker  Cognition: Cognition Overall Cognitive Status: Within Functional Limits for tasks assessed Orientation Level: Oriented X4 Cognition Arousal/Alertness: Awake/alert Behavior During Therapy: WFL for tasks assessed/performed Overall Cognitive Status: Within Functional Limits for tasks assessed General Comments: Patient very pleasent and motivated   Blood pressure (!) 115/54, pulse (!) 53, temperature 98.1 F (36.7 C), temperature source Oral, resp. rate 17, height $RemoveBe'5\' 3"'KaVzixSNf$  (1.6 m), weight 74.4 kg, SpO2 96 %. Physical Exam Vitals and nursing note reviewed.  Constitutional:      Appearance: Normal appearance.     Comments: Pt sitting up in bedside chair, appropriate, wearing cervical collar, NAD  HENT:     Head: Normocephalic.     Comments: Smile equal- missing some front teeth-     Right Ear:  External ear normal.     Left Ear: External ear normal.     Nose: Nose normal. No congestion.     Mouth/Throat:     Mouth: Mucous membranes are dry.     Pharynx: Oropharynx is clear. No oropharyngeal exudate.  Eyes:     General:        Right eye: No discharge.        Left eye: No discharge.     Extraocular Movements: Extraocular movements intact.     Conjunctiva/sclera: Conjunctivae normal.  Neck:     Comments: Collar in place.  Anterior cervical incision C/D/I Cardiovascular:     Comments: RRR- no JVD Pulmonary:     Comments: CTA B/L- no W/R/R- good air movement  Abdominal:     Comments: Soft, NT, ND, (+)BS  -hypoactive  Musculoskeletal:     Comments: RUE 4+/5 in biceps, triceps, WE grip and finger abd LUE same 4+/5 in same muscles LEs_ HF 3+/5, KE 4/5, DF R 4+/5 L 4-/5, PF 5-/5 B/L  Skin:    General: Skin is warm and dry.     Comments: Scab on left knee.  B/L forearm IV's look OK  Neurological:     Mental Status: She is alert and oriented to person, place, and time.     Comments: Pt denies any sensory deficits on exam- in all 4 extremities Ox3  Psychiatric:        Mood and Affect: Mood normal.        Behavior: Behavior normal.     Results for orders placed or performed during the hospital encounter of 10/14/20 (from the past 24 hour(s))  Glucose, capillary     Status: Abnormal   Collection Time: 10/22/20 11:37 AM  Result Value Ref Range   Glucose-Capillary 287 (H) 70 - 99 mg/dL  Glucose, capillary     Status: Abnormal   Collection Time: 10/22/20  4:41 PM  Result Value Ref Range   Glucose-Capillary 214 (H) 70 - 99 mg/dL  Glucose, capillary     Status: Abnormal   Collection Time: 10/22/20  9:08 PM  Result Value Ref Range   Glucose-Capillary 231 (H) 70 - 99 mg/dL  Glucose, capillary     Status: Abnormal   Collection Time: 10/23/20  6:14 AM  Result Value Ref Range   Glucose-Capillary 125 (H) 70 - 99 mg/dL   No results found.   Assessment/Plan: Diagnosis:  cervical myelopathy s/p ACDF due to spinal cord compression 1. Does the need for close, 24 hr/day  medical supervision in concert with the patient's rehab needs make it unreasonable for this patient to be served in a less intensive setting? Yes 2. Co-Morbidities requiring supervision/potential complications: chronic pain; B/L TKRs, DM, MI, and progressive weakness/cervical myelopathy due to spinal cord compression 3. Due to bladder management, bowel management, safety, skin/wound care, disease management, medication administration, pain management and patient education, does the patient require 24 hr/day rehab nursing? Yes 4. Does the patient require coordinated care of a physician, rehab nurse, therapy disciplines of PT and OT to address physical and functional deficits in the context of the above medical diagnosis(es)? Yes Addressing deficits in the following areas: balance, endurance, locomotion, strength, transferring, bathing, dressing, feeding, grooming and toileting 5. Can the patient actively participate in an intensive therapy program of at least 3 hrs of therapy per day at least 5 days per week? Yes 6. The potential for patient to make measurable gains while on inpatient rehab is good 7. Anticipated functional outcomes upon discharge from inpatient rehab are modified independent and supervision  with PT, modified independent and supervision with OT, n/a with SLP. 8. Estimated rehab length of stay to reach the above functional goals is: 10-12 days 9. Anticipated discharge destination: Home 10. Overall Rehab/Functional Prognosis: good  RECOMMENDATIONS: This patient's condition is appropriate for continued rehabilitative care in the following setting: CIR Patient has agreed to participate in recommended program. Potentially Note that insurance prior authorization may be required for reimbursement for recommended care.  Comment:  1. Pt is an appropriate rehab candidate for inpt Rehab- would  need ~ 10 days to get to function she can be safe at home.  2. Pt reports only gets pain meds q6 hours, but that makes no sense- gets oxy 10 mg q3 hours prn on top of oxycontin BID? Might check into this? 3. Suggest putting on senokot 1-2 tabs daily to BID to help with severe constipation- required enema to empty yesterday.  4. Will submit for inpt rehab/CIR 5. Thank you for this consult.   Bary Leriche, PA-C 10/23/2020   I have personally performed a face to face diagnostic evaluation of this patient and formulated the key components of the plan.  Additionally, I have personally reviewed laboratory data, imaging studies, as well as relevant notes and concur with the physician assistant's documentation above.

## 2020-10-24 DIAGNOSIS — W19XXXA Unspecified fall, initial encounter: Secondary | ICD-10-CM | POA: Diagnosis not present

## 2020-10-24 DIAGNOSIS — I2510 Atherosclerotic heart disease of native coronary artery without angina pectoris: Secondary | ICD-10-CM | POA: Diagnosis not present

## 2020-10-24 DIAGNOSIS — E119 Type 2 diabetes mellitus without complications: Secondary | ICD-10-CM | POA: Diagnosis not present

## 2020-10-24 DIAGNOSIS — G9520 Unspecified cord compression: Secondary | ICD-10-CM | POA: Diagnosis not present

## 2020-10-24 LAB — BASIC METABOLIC PANEL
Anion gap: 8 (ref 5–15)
BUN: 17 mg/dL (ref 8–23)
CO2: 32 mmol/L (ref 22–32)
Calcium: 9.4 mg/dL (ref 8.9–10.3)
Chloride: 97 mmol/L — ABNORMAL LOW (ref 98–111)
Creatinine, Ser: 0.65 mg/dL (ref 0.44–1.00)
GFR, Estimated: >60 mL/min (ref >60)
Glucose, Bld: 123 mg/dL — ABNORMAL HIGH (ref 70–99)
Potassium: 3.6 mmol/L (ref 3.5–5.1)
Sodium: 137 mmol/L (ref 135–145)

## 2020-10-24 LAB — CBC
HCT: 32.2 % — ABNORMAL LOW (ref 36.0–46.0)
Hemoglobin: 10.1 g/dL — ABNORMAL LOW (ref 12.0–15.0)
MCH: 27.1 pg (ref 26.0–34.0)
MCHC: 31.4 g/dL (ref 30.0–36.0)
MCV: 86.3 fL (ref 80.0–100.0)
Platelets: 272 10*3/uL (ref 150–400)
RBC: 3.73 MIL/uL — ABNORMAL LOW (ref 3.87–5.11)
RDW: 14.7 % (ref 11.5–15.5)
WBC: 11.2 10*3/uL — ABNORMAL HIGH (ref 4.0–10.5)
nRBC: 0.2 % (ref 0.0–0.2)

## 2020-10-24 LAB — GLUCOSE, CAPILLARY
Glucose-Capillary: 141 mg/dL — ABNORMAL HIGH (ref 70–99)
Glucose-Capillary: 311 mg/dL — ABNORMAL HIGH (ref 70–99)
Glucose-Capillary: 281 mg/dL — ABNORMAL HIGH (ref 70–99)
Glucose-Capillary: 265 mg/dL — ABNORMAL HIGH (ref 70–99)

## 2020-10-24 LAB — MAGNESIUM: Magnesium: 1.5 mg/dL — ABNORMAL LOW (ref 1.7–2.4)

## 2020-10-24 MED ORDER — IBUPROFEN 200 MG PO TABS: 200.0000 mg | ORAL_TABLET | Freq: Four times a day (QID) | ORAL | Status: DC | PRN

## 2020-10-24 MED ORDER — POTASSIUM CHLORIDE 20 MEQ PO PACK
40.0000 meq | PACK | Freq: Once | ORAL | Status: AC
  Administered 2020-10-24: 40 meq via ORAL
  Filled 2020-10-24: qty 2

## 2020-10-24 MED ORDER — MAGNESIUM SULFATE 2 GM/50ML IV SOLN
2.0000 g | Freq: Once | INTRAVENOUS | Status: AC
  Administered 2020-10-24: 2 g via INTRAVENOUS
  Filled 2020-10-24: qty 50

## 2020-10-24 MED FILL — Thrombin For Soln Kit 20000 Unit: CUTANEOUS | Qty: 1 | Status: AC

## 2020-10-24 NOTE — Progress Notes (Signed)
     Subjective: 4 Days Post-Op Procedure(s) (LRB): C5-6, C6-7 ANTERIOR CERVICAL DISCECTOMY, C6 CORPECTOMY, C5-C7 PLATE, ALLOGRAFT (N/A) Awake, alert and oriented x 4. Awaiting insurance authorization in order to consider CIR. Not walking stairs as yet, has mobile home and stair into and out of home.  Voiding.   Patient reports pain as moderate.    Objective:   VITALS:  Temp:  [97.5 F (36.4 C)-98.4 F (36.9 C)] 97.7 F (36.5 C) (05/31 0746) Pulse Rate:  [42-46] 45 (05/31 0746) Resp:  [15-18] 18 (05/31 0746) BP: (103-126)/(46-87) 110/46 (05/31 0746) SpO2:  [96 %-97 %] 97 % (05/31 0746)  Neurologically intact ABD soft Neurovascular intact Sensation intact distally Intact pulses distally Dorsiflexion/Plantar flexion intact Incision: dressing C/D/I and no drainage   LABS Recent Labs    10/22/20 0434 10/24/20 0152  HGB 10.2* 10.1*  WBC 11.8* 11.2*  PLT 262 272   Recent Labs    10/22/20 0434 10/24/20 0152  NA 139 137  K 4.1 3.6  CL 102 97*  CO2 30 32  BUN 19 17  CREATININE 0.67 0.65  GLUCOSE 220* 123*   No results for input(s): LABPT, INR in the last 72 hours.   Assessment/Plan: 4 Days Post-Op Procedure(s) (LRB): C5-6, C6-7 ANTERIOR CERVICAL DISCECTOMY, C6 CORPECTOMY, C5-C7 PLATE, ALLOGRAFT (N/A)  Advance diet Up with therapy D/C IV fluids  CIR eval if unable to go to CIR then SNF vs home. Stable from ortho stand point.  [name] 10/24/2020, 8:11 AM

## 2020-10-24 NOTE — Progress Notes (Signed)
PROGRESS NOTE  [name] AVW:[id number] DOB: 16-Jun-1955 DOA: 10/14/2020 PCP: Courtney Paris, NP   LOS: 10 days   Brief Narrative / Interim history: 65 year old woman with diabetes, CAD, OA, comes into the hospital with multiple falls at home, right leg weakness.  She is also been complaining of decreased sensation in her hands.  Lumbar spine MRI showed multilevel lumbar spondylosis L1-2 and L4-5, moderate canal stenosis and severe bilateral foraminal stenosis L4-5 L5-S1.  C-spine MRI was done due to hand involvement which showed severe spinal stenosis and cord compression at C5-6 level possibly C6-7.  Orthopedic surgery consulted  Subjective / 24h Interval events: Continues to feel well, able to work more and more with physical therapy.  Less weakness in her legs, less numbness in her hands.  She denies any chest pain, denies any palpitations  Assessment & Plan: Principal Problem Severe spinal stenosis and cord compression C5-6, C6-7 -orthopedic surgery consulted, she has been transferred from [facility] to [facility] and she is status post C 5-6, C6-7 anterior cervical discectomy, C6 corpectomy, C5-7 plate allograft -Doing well postoperatively, PT recommending CIR, insurance authorization for CIR pending  Active Problems Low back pain-chronic, moderate canal stenosis and severe bilateral foraminal stenosis L4-5, L5-S1.  Continue conservative management  PVCs, questionable bradycardia-patient with frequent PVCs, she notes she has a history of that.  2D echo done yesterday is unremarkable with normal EF, no WMA, grade 1 diastolic dysfunction.  RV is normal.  She seems to have quite frequent PVCs, I recommended to the patient outpatient cardiology follow-up to ensure she does not have a clinically significant PVC burden  Polypharmacy-recommend further addressed as an outpatient  CAD-asymptomatic  DM 2, poorly controlled with hyperglycemia-A1c 8.2.  Continue Levemir, sliding  scale  CBG (last 3)  Recent Labs    10/23/20 2101 10/24/20 0637 10/24/20 1115  GLUCAP 167* 141* 311*   Scheduled Meds: . amitriptyline  10 mg Oral QHS  . ARIPiprazole  5 mg Oral Daily  . docusate sodium  100 mg Oral BID  . fenofibrate  54 mg Oral Daily  . gabapentin  800 mg Oral TID  . insulin aspart  0-15 Units Subcutaneous TID WC  . insulin aspart  0-5 Units Subcutaneous QHS  . insulin aspart  4 Units Subcutaneous TID WC  . insulin detemir  24 Units Subcutaneous QHS  . magnesium gluconate  500 mg Oral QHS  . oxyCODONE  10 mg Oral Q12H  . pantoprazole  40 mg Oral QHS  . pravastatin  80 mg Oral Daily  . sertraline  100 mg Oral QHS  . vitamin B-12  1,000 mcg Oral Daily   Continuous Infusions: . methocarbamol (ROBAXIN) IV     PRN Meds:.albuterol, albuterol, alum & mag hydroxide-simeth, bisacodyl, ibuprofen, lip balm, menthol-cetylpyridinium **OR** phenol, methocarbamol **OR** methocarbamol (ROBAXIN) IV, morphine injection, ondansetron **OR** ondansetron (ZOFRAN) IV, oxyCODONE, polyethylene glycol  Diet Orders (From admission, onward)    Start     Ordered   10/15/20 0028  Diet Carb Modified Fluid consistency: Thin; Room service appropriate? Yes  Diet effective now       Question Answer Comment  Diet-HS Snack? Nothing   Calorie Level Medium 1600-2000   Fluid consistency: Thin   Room service appropriate? Yes      10/15/20 0027          DVT prophylaxis:      Code Status: Full Code  Family Communication: No family at bedside  Status is: Inpatient  Remains inpatient appropriate because:Inpatient level of care appropriate due to severity of illness   Dispo: The patient is from: Home              Anticipated d/c is to: TBD              Patient currently is not medically stable to d/c.   Difficult to place patient No   Level of care: Med-Surg  Consultants:  Orthopedic surgery  Procedures:  None  Microbiology   None  Antimicrobials: None   Objective: Vitals:   10/23/20 1510 10/23/20 2100 10/24/20 0436 10/24/20 0746  BP: (!) 110/52 103/87 126/71 (!) 110/46  Pulse: (!) 46 (!) 42 (!) 43 (!) 45  Resp: 17 15 15 18   Temp: 98.4 F (36.9 C) 98.2 F (36.8 C) (!) 97.5 F (36.4 C) 97.7 F (36.5 C)  TempSrc: Oral Oral Oral Oral  SpO2: 97% 96% 97% 97%  Weight:      Height:       No intake or output data in the 24 hours ending 10/24/20 1126 Filed Weights   10/14/20 1513 10/20/20 1215  Weight: 75 kg 74.4 kg    Examination:  Constitutional: NAD Eyes: No icterus ENMT: mmm Neck: normal, supple Respiratory: Lungs are clear bilaterally, no wheezing, no crackles Cardiovascular: Regular rate and rhythm, no murmurs.  No peripheral edema Abdomen: Soft, nontender, nondistended, bowel sounds positive Musculoskeletal: no clubbing / cyanosis.  Skin: No rashes seen Neuro: No focal deficits   Data Reviewed: I have independently reviewed following labs and imaging studies   CBC: Recent Labs  Lab 10/20/20 0034 10/21/20 0123 10/22/20 0434 10/24/20 0152  WBC 11.9* 11.1* 11.8* 11.2*  HGB 13.6 10.8* 10.2* 10.1*  HCT 43.4 35.0* 32.9* 32.2*  MCV 85.4 87.1 86.1 86.3  PLT 296 238 262 272   Basic Metabolic Panel: Recent Labs  Lab 10/20/20 0034 10/21/20 0123 10/22/20 0434 10/24/20 0152 10/24/20 0429  NA 138 134* 139 137  --   K 4.6 4.7 4.1 3.6  --   CL 96* 98 102 97*  --   CO2 33* 26 30 32  --   GLUCOSE 135* 329* 220* 123*  --   BUN 27* 19 19 17   --   CREATININE 0.99 0.75 0.67 0.65  --   CALCIUM 10.9* 9.2 9.5 9.4  --   MG  --   --  1.8  --  1.5*   Liver Function Tests: No results for input(s): AST, ALT, ALKPHOS, BILITOT, PROT, ALBUMIN in the last 168 hours. Coagulation Profile: No results for input(s): INR, PROTIME in the last 168 hours. HbA1C: No results for input(s): HGBA1C in the last 72 hours. CBG: Recent Labs  Lab 10/23/20 1114 10/23/20 1618 10/23/20 2101 10/24/20 0637  10/24/20 1115  GLUCAP 241* 171* 167* 141* 311*    Recent Results (from the past 240 hour(s))  SARS CORONAVIRUS 2 (TAT 6-24 HRS) Nasopharyngeal Nasopharyngeal Swab     Status: None   Collection Time: 10/14/20  9:35 PM   Specimen: Nasopharyngeal Swab  Result Value Ref Range Status   SARS Coronavirus 2 NEGATIVE NEGATIVE Final    Comment: (NOTE) SARS-CoV-2 target nucleic acids are NOT DETECTED.  The SARS-CoV-2 RNA is generally detectable in upper and lower respiratory specimens during the acute phase of infection. Negative results do not preclude SARS-CoV-2 infection, do not rule out co-infections with other pathogens, and should not be used as the sole basis for treatment or other patient management decisions. Negative  results must be combined with clinical observations, patient history, and epidemiological information. The expected result is Negative.  Fact Sheet for Patients: HairSlick.no  Fact Sheet for Healthcare Providers: quierodirigir.com  This test is not yet approved or cleared by the [place] FDA and  has been authorized for detection and/or diagnosis of SARS-CoV-2 by FDA under an Emergency Use Authorization (EUA). This EUA will remain  in effect (meaning this test can be used) for the duration of the COVID-19 declaration under Se ction 564(b)(1) of the Act, 21 U.S.C. section 360bbb-3(b)(1), unless the authorization is terminated or revoked sooner.  Performed at [facility], [address] [place], [place] [address]   Surgical pcr screen     Status: None   Collection Time: 10/19/20  6:42 PM   Specimen: Nasal Mucosa; Nasal Swab  Result Value Ref Range Status   MRSA, PCR NEGATIVE NEGATIVE Final   Staphylococcus aureus NEGATIVE NEGATIVE Final    Comment: (NOTE) The Xpert SA Assay (FDA approved for NASAL specimens in patients 84 years of age and older), is one component of a  comprehensive surveillance program. It is not intended to diagnose infection nor to guide or monitor treatment. Performed at [facility], [address] [place], [place] [address]      Radiology Studies: ECHOCARDIOGRAM COMPLETE  Result Date: 10/23/2020    ECHOCARDIOGRAM REPORT   Patient Name:   [name] Date of Exam: 10/23/2020 Medical Rec #:  [id number]    Height:       63.0 in Accession #:    [id number]   Weight:       164.0 lb Date of Birth:  [birth year]    BSA:          1.777 m Patient Age:    64 years     BP:           115/54 mmHg Patient Gender: F            HR:           100 bpm. Exam Location:  Inpatient Procedure: 2D Echo, Cardiac Doppler and Color Doppler Indications:    R94.31 Abnormal EKG  History:        Patient has no prior history of Echocardiogram examinations.  Sonographer:    Roosvelt Maser RDCS Referring Phys: [phone] Daylene Katayama [facility] IMPRESSIONS  1. Left ventricular ejection fraction, by estimation, is 60 to 65%. The left ventricle has normal function. The left ventricle has no regional wall motion abnormalities. Left ventricular diastolic parameters are consistent with Grade I diastolic dysfunction (impaired relaxation).  2. Right ventricular systolic function is normal. The right ventricular size is normal.  3. The mitral valve is normal in structure. Mild mitral valve regurgitation. No evidence of mitral stenosis.  4. The aortic valve is tricuspid. Aortic valve regurgitation is not visualized. Mild aortic valve sclerosis is present, with no evidence of aortic valve stenosis.  5. The inferior vena cava is normal in size with greater than 50% respiratory variability, suggesting right atrial pressure of 3 mmHg. FINDINGS  Left Ventricle: Left ventricular ejection fraction, by estimation, is 60 to 65%. The left ventricle has normal function. The left ventricle has no regional wall motion abnormalities. The left ventricular internal cavity size was normal in size. There is  no left  ventricular hypertrophy. Left ventricular diastolic parameters are consistent with Grade I diastolic dysfunction (impaired relaxation). Right Ventricle: The right ventricular size is normal. Right ventricular systolic function is  normal. Left Atrium: Left atrial size was normal in size. Right Atrium: Right atrial size was normal in size. Pericardium: There is no evidence of pericardial effusion. Mitral Valve: The mitral valve is normal in structure. Mild mitral valve regurgitation. No evidence of mitral valve stenosis. Tricuspid Valve: The tricuspid valve is normal in structure. Tricuspid valve regurgitation is trivial. No evidence of tricuspid stenosis. Aortic Valve: The aortic valve is tricuspid. Aortic valve regurgitation is not visualized. Mild aortic valve sclerosis is present, with no evidence of aortic valve stenosis. Aortic valve mean gradient measures 8.0 mmHg. Aortic valve peak gradient measures 17.5 mmHg. Aortic valve area, by VTI measures 1.60 cm. Pulmonic Valve: The pulmonic valve was not well visualized. Pulmonic valve regurgitation is not visualized. No evidence of pulmonic stenosis. Aorta: The aortic root is normal in size and structure. Venous: The inferior vena cava is normal in size with greater than 50% respiratory variability, suggesting right atrial pressure of 3 mmHg. IAS/Shunts: No atrial level shunt detected by color flow Doppler.  LEFT VENTRICLE PLAX 2D LVIDd:         4.45 cm  Diastology LVIDs:         3.10 cm  LV e' medial:    6.31 cm/s LV PW:         1.05 cm  LV E/e' medial:  11.5 LV IVS:        1.00 cm  LV e' lateral:   7.29 cm/s LVOT diam:     2.00 cm  LV E/e' lateral: 10.0 LV SV:         49 LV SV Index:   28 LVOT Area:     3.14 cm  RIGHT VENTRICLE RV Basal diam:  3.50 cm LEFT ATRIUM             Index       RIGHT ATRIUM           Index LA diam:        3.80 cm 2.14 cm/m  RA Area:     19.70 cm LA Vol (A2C):   50.6 ml 28.47 ml/m RA Volume:   56.30 ml  31.68 ml/m LA Vol (A4C):   60.7  ml 34.15 ml/m LA Biplane Vol: 57.6 ml 32.41 ml/m  AORTIC VALVE AV Area (Vmax):    1.47 cm AV Area (Vmean):   1.57 cm AV Area (VTI):     1.60 cm AV Vmax:           209.00 cm/s AV Vmean:          130.000 cm/s AV VTI:            0.306 m AV Peak Grad:      17.5 mmHg AV Mean Grad:      8.0 mmHg LVOT Vmax:         97.90 cm/s LVOT Vmean:        65.000 cm/s LVOT VTI:          0.156 m LVOT/AV VTI ratio: 0.51  AORTA Ao Root diam: 3.15 cm MITRAL VALVE MV Area (PHT): 3.51 cm    SHUNTS MV Decel Time: 216 msec    Systemic VTI:  0.16 m MV E velocity: 72.80 cm/s  Systemic Diam: 2.00 cm MV A velocity: 58.80 cm/s MV E/A ratio:  1.24 Olga Millers MD Electronically signed by Olga Millers MD Signature Date/Time: 10/23/2020/6:05:27 PM    Final      Pamella Pert, MD, PhD [facility]  Between 7 am -  7 pm I am available, please contact me via Amion (for emergencies) or Securechat (non urgent messages)  Between 7 pm - 7 am I am not available, please contact night coverage MD/APP via Amion

## 2020-10-24 NOTE — Progress Notes (Signed)
CCMD called regarding patient's telemetry: Ventricular tachycardia with 9 beat run PVC's, increase frequency.  Sent message to on call MD via Amion.  Jaret.Balding MD ordered Potassium Chloride packet 57m Eq once and lab to check Magnesium level. Give Magnesium Sulfate 2 mg IV if less than 2.  1157 Lab: morning magnesium 1.5 Ordered Magnesium Sulfate 2 mg IV once.

## 2020-10-24 NOTE — Progress Notes (Signed)
Physical Therapy Treatment Patient Details Name: [name] MRN: [id number] DOB: September 21, 1955 Today's Date: 10/24/2020    History of Present Illness Pt is a 65 y/o female who presents with multiple falls at home. She is now s/p C5-C7 ACDF on 10/20/2020. 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    PT Comments    Pt slowly progressing. Pt remains to demonstrate bilat LE weakness and shakiness with ambulation in addition fine motor deficits. Pt remains appropriate for CIR upon d/c to maximize functional recovery. Pt very motivated and eager for aggressive rehab programs.    Follow Up Recommendations  CIR     Equipment Recommendations  None recommended by PT    Recommendations for Other Services Rehab consult     Precautions / Restrictions Precautions Precautions: Cervical;Fall Precaution Booklet Issued: Yes (comment) Precaution Comments: pt able to recall 2/3 precautions, pt re-educated, continued max verbal cues to adhere functionally Required Braces or Orthoses: Cervical Brace Cervical Brace: Hard collar;At all times Restrictions Weight Bearing Restrictions: No    Mobility  Bed Mobility Overal bed mobility: Needs Assistance Bed Mobility: Rolling;Sidelying to Sit Rolling: Min assist Sidelying to sit: Min assist       General bed mobility comments: increased assist today due to R hip pain, max verbal cues for proper log roll technique, HOB elevated, use of bed rail, minA for trunk elevation    Transfers Overall transfer level: Needs assistance Equipment used: Rolling walker (2 wheeled) Transfers: Sit to/from Stand Sit to Stand: Min assist         General transfer comment: minA to steady during transition of hands, verbal cues for hand placement and to minimize trunk flexion and increase reliance on bilat LEs however pt with bilat  LE weakness  Ambulation/Gait Ambulation/Gait assistance: Min assist Gait Distance (Feet): 80 Feet Assistive device: Rolling walker (2 wheeled) Gait Pattern/deviations: Step-through pattern;Decreased stride length;Narrow base of support Gait velocity: dec Gait velocity interpretation: <1.8 ft/sec, indicate of risk for recurrent falls General Gait Details: ambulation tolerance limited by L ankle pain, pt remains shaky, minA for walker management, pt with weak grip on walker   Stairs             Wheelchair Mobility    Modified Rankin (Stroke Patients Only)       Balance Overall balance assessment: Needs assistance Sitting-balance support: No upper extremity supported;Feet supported Sitting balance-Leahy Scale: Fair     Standing balance support: Bilateral upper extremity supported;During functional activity;Single extremity supported Standing balance-Leahy Scale: Poor Standing balance comment: pt requiring UE support, pt with noted lateral and ant/post sway in static standing without UE support                            Cognition Arousal/Alertness: Awake/alert Behavior During Therapy: WFL for tasks assessed/performed Overall Cognitive Status: Within Functional Limits for tasks assessed                                 General Comments: difficulty with recall of precautions      Exercises General Exercises - Lower Extremity Ankle Circles/Pumps: AROM;Both;15 reps Long Arc Quad: AROM;Both;10 reps;Seated (with 5 sec hold)    General Comments General comments (skin integrity, edema, etc.): L ankle without swelling but pt reports pain anteriorly  Pertinent Vitals/Pain Pain Assessment: 0-10 Pain Score: 6  Pain Location: L ankle more than neck Pain Descriptors / Indicators: Discomfort Pain Intervention(s): Monitored during session    Home Living                      Prior Function            PT Goals (current goals can  now be found in the care plan section) Progress towards PT goals: Progressing toward goals    Frequency    Min 5X/week      PT Plan Frequency needs to be updated    Co-evaluation              AM-PAC PT "6 Clicks" Mobility   Outcome Measure  Help needed turning from your back to your side while in a flat bed without using bedrails?: A Little Help needed moving from lying on your back to sitting on the side of a flat bed without using bedrails?: A Little Help needed moving to and from a bed to a chair (including a wheelchair)?: A Little Help needed standing up from a chair using your arms (e.g., wheelchair or bedside chair)?: A Little Help needed to walk in hospital room?: A Little Help needed climbing 3-5 steps with a railing? : A Lot 6 Click Score: 17    End of Session Equipment Utilized During Treatment: Gait belt Activity Tolerance: Patient tolerated treatment well Patient left: in chair;with call bell/phone within reach;with chair alarm set Nurse Communication: Mobility status PT Visit Diagnosis: Other abnormalities of gait and mobility (R26.89);Muscle weakness (generalized) (M62.81)     Time: 0737-1062 PT Time Calculation (min) (ACUTE ONLY): 16 min  Charges:  $Gait Training: 8-22 mins                     Lewis Shock, PT, DPT Acute Rehabilitation Services Pager #: [phone] Office #: [phone]    Iona Hansen 10/24/2020, 12:51 PM

## 2020-10-24 NOTE — TOC Initial Note (Signed)
Transition of Care [facility]) - Initial/Assessment Note    Patient Details  Name: [name] MRN: [id number] Date of Birth: 03/28/1956  Transition of Care [facility]) CM/SW Contact:    Ralene Bathe, LCSWA Phone Number: 10/24/2020, 1:17 PM  Clinical Narrative:                 CSW received consult for possible SNF vs. HH for the patient. Patient reported being hopeful that her insurance will approve CIR, but if not approved she would prefer home with home health.  Patient reports that she has been doing well with PT.  Patient did not have a preference of agency. CSW discussed insurance authorization process and provided Medicare SNF ratings list.   CSW verified the patient's phone number, address, and PCP.    Patient is from home and reports that her adult daughter, [name], and her three grandchildren reside with her.  The daughter is currently not working.  A family friend from [place], [place], will be coming the home to a while to be with the patient when she discharges.  Pending insurance approval or denial for CIR.  Expected Discharge Plan: Home w Home Health Services Barriers to Discharge: Insurance Authorization,Continued Medical Work up   Patient Goals and CMS Choice Patient states their goals for this hospitalization and ongoing recovery are:: "To get back to walking without falling" CMS Medicare.gov Compare Post Acute Care list provided to:: Patient Choice offered to / list presented to : Patient  Expected Discharge Plan and Services Expected Discharge Plan: Home w Home Health Services       Living arrangements for the past 2 months: Single Family Home                                      Prior Living Arrangements/Services Living arrangements for the past 2 months: Single Family Home Lives with:: Self,Adult Children,Other (Comment) Medical laboratory scientific officer) Patient language and need for interpreter reviewed:: Yes Do you feel safe going back to the place where you live?: Yes       Need for Family Participation in Patient Care: Yes (Comment) Care giver support system in place?: Yes (comment)   Criminal Activity/Legal Involvement Pertinent to Current Situation/Hospitalization: No - Comment as needed  Activities of Daily Living Home Assistive Devices/Equipment: CBG Meter,Walker (specify type) ADL Screening (condition at time of admission) Patient's cognitive ability adequate to safely complete daily activities?: Yes Is the patient deaf or have difficulty hearing?: No Does the patient have difficulty seeing, even when wearing glasses/contacts?: No Does the patient have difficulty concentrating, remembering, or making decisions?: No Patient able to express need for assistance with ADLs?: Yes Does the patient have difficulty dressing or bathing?: No Independently performs ADLs?: Yes (appropriate for developmental age) Does the patient have difficulty walking or climbing stairs?: Yes Weakness of Legs: Both Weakness of Arms/Hands: None  Permission Sought/Granted   Permission granted to share information with : Yes, Verbal Permission Granted     Permission granted to share info w AGENCY: Home health agencies        Emotional Assessment Appearance:: Appears older than stated age Attitude/Demeanor/Rapport: Engaged Affect (typically observed): Pleasant Orientation: : Oriented to Situation,Oriented to  Time,Oriented to Place,Oriented to Self Alcohol / Substance Use: Not Applicable Psych Involvement: No (comment)  Admission diagnosis:  Weakness [R53.1] Patient Active Problem List   Diagnosis Date Noted  . Other spondylosis with myelopathy, cervical region  10/20/2020    Class: Chronic  . Cervical cord compression with myelopathy (HCC) 10/18/2020  . Spinal stenosis, cervical region 10/18/2020  . Polypharmacy 10/18/2020  . CAD (coronary artery disease) 10/18/2020  . Weakness 10/14/2020  . Fall at home, initial encounter 10/14/2020  . S/P total knee arthroplasty,  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  . Nephrolithiasis 07/08/2017  . Abnormal EKG 06/18/2017  . Preop cardiovascular exam 06/18/2017  . History of myocardial infarction 06/18/2017  . Chronic joint pain 10/08/2016  . Asthma 10/08/2016  . History of kidney stones 10/08/2016  . Insomnia 10/08/2016  . Diabetes mellitus type 2, noninsulin dependent (HCC) 09/10/2013  . Obesity (BMI 30-39.9) 09/10/2013  . GERD 03/15/2009   PCP:  Courtney Paris, NP Pharmacy:   [facility] [address], [place] - [address] [place] [address] Phone: [phone] Fax: [phone]     Social Determinants of Health (SDOH) Interventions    Readmission Risk Interventions No flowsheet data found.

## 2020-10-24 NOTE — Progress Notes (Signed)
Inpatient Rehab Admissions Coordinator:   I do not yet have insurance authorization or a bed on CIR for this Pt. I will continue to follow for potential admit pending insurance auth and bed availability.   Megan Salon, MS, CCC-SLP Rehab Admissions Coordinator  [phone] (celll) [phone] (office)

## 2020-10-25 ENCOUNTER — Encounter (HOSPITAL_COMMUNITY): Payer: Self-pay | Admitting: Specialist

## 2020-10-25 ENCOUNTER — Telehealth: Payer: Self-pay | Admitting: Specialist

## 2020-10-25 DIAGNOSIS — M4712 Other spondylosis with myelopathy, cervical region: Secondary | ICD-10-CM | POA: Diagnosis not present

## 2020-10-25 DIAGNOSIS — M4802 Spinal stenosis, cervical region: Secondary | ICD-10-CM | POA: Diagnosis not present

## 2020-10-25 DIAGNOSIS — G8929 Other chronic pain: Secondary | ICD-10-CM

## 2020-10-25 DIAGNOSIS — J4520 Mild intermittent asthma, uncomplicated: Secondary | ICD-10-CM | POA: Diagnosis not present

## 2020-10-25 DIAGNOSIS — M2550 Pain in unspecified joint: Secondary | ICD-10-CM

## 2020-10-25 DIAGNOSIS — G9520 Unspecified cord compression: Secondary | ICD-10-CM | POA: Diagnosis not present

## 2020-10-25 LAB — BASIC METABOLIC PANEL
Anion gap: 9 (ref 5–15)
BUN: 16 mg/dL (ref 8–23)
CO2: 32 mmol/L (ref 22–32)
Calcium: 9.5 mg/dL (ref 8.9–10.3)
Chloride: 97 mmol/L — ABNORMAL LOW (ref 98–111)
Creatinine, Ser: 0.71 mg/dL (ref 0.44–1.00)
GFR, Estimated: >60 mL/min (ref >60)
Glucose, Bld: 142 mg/dL — ABNORMAL HIGH (ref 70–99)
Potassium: 3.9 mmol/L (ref 3.5–5.1)
Sodium: 138 mmol/L (ref 135–145)

## 2020-10-25 LAB — CBC
HCT: 33.4 % — ABNORMAL LOW (ref 36.0–46.0)
Hemoglobin: 10.2 g/dL — ABNORMAL LOW (ref 12.0–15.0)
MCH: 26.8 pg (ref 26.0–34.0)
MCHC: 30.5 g/dL (ref 30.0–36.0)
MCV: 87.7 fL (ref 80.0–100.0)
Platelets: 304 10*3/uL (ref 150–400)
RBC: 3.81 MIL/uL — ABNORMAL LOW (ref 3.87–5.11)
RDW: 14.8 % (ref 11.5–15.5)
WBC: 10 10*3/uL (ref 4.0–10.5)
nRBC: 0 % (ref 0.0–0.2)

## 2020-10-25 LAB — GLUCOSE, CAPILLARY
Glucose-Capillary: 156 mg/dL — ABNORMAL HIGH (ref 70–99)
Glucose-Capillary: 151 mg/dL — ABNORMAL HIGH (ref 70–99)
Glucose-Capillary: 222 mg/dL — ABNORMAL HIGH (ref 70–99)
Glucose-Capillary: 172 mg/dL — ABNORMAL HIGH (ref 70–99)

## 2020-10-25 LAB — MAGNESIUM: Magnesium: 1.7 mg/dL (ref 1.7–2.4)

## 2020-10-25 NOTE — Progress Notes (Addendum)
Occupational Therapy Treatment Patient Details Name: [name] MRN: [id number] DOB: Nov 13, 1955 Today's Date: 10/25/2020    History of present illness Pt is a 65 y/o female who presents with multiple falls at home. She is now s/p C5-C7 ACDF on 10/20/2020. 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   OT comments  [name] is progressing very well. Pt recalled 3/3 back precautions and demonstrated log rolling without vc; bed mobility was min guard for safety. Pt was also min guard for sit<>stand with rw and short room distance ambulation with no LOB or report of ankle pain. Pt reported that she will have someone with her 24/7 at d/c and would like to go home with [facility] OT, as she did after her past knee surgery. Pt would benefit from lower body AE education to progress indep in ADLs. D/c recommendation updated.    Follow Up Recommendations  Home health OT;Supervision/Assistance - 24 hour;Other (comment) (Updated d/c rec to Home with HH OT and supervision 24/7 which can be provided by daughter and friend who is coming from [place] to stay with her while she is recovering)    Equipment Recommendations  3 in 1 bedside commode       Precautions / Restrictions Precautions Precautions: Cervical;Fall Precaution Booklet Issued: Yes (comment) Precaution Comments: pt able to recall 3/3 precautions and demonstrated great abiliyt to log roll without vc Required Braces or Orthoses: Cervical Brace Cervical Brace: Hard collar;At all times Restrictions Weight Bearing Restrictions: No       Mobility Bed Mobility Overal bed mobility: Needs Assistance Bed Mobility: Rolling;Sidelying to Sit Rolling: Supervision Sidelying to sit: Supervision       General bed mobility comments: slightly incrased time; HOB elevated    Transfers Overall transfer level: Needs  assistance Equipment used: Rolling walker (2 wheeled) Transfers: Sit to/from Stand Sit to Stand: Min guard         General transfer comment: min guard for safety    Balance Overall balance assessment: Needs assistance Sitting-balance support: No upper extremity supported;Feet supported Sitting balance-Leahy Scale: Fair     Standing balance support: Bilateral upper extremity supported;During functional activity;Single extremity supported Standing balance-Leahy Scale: Poor                             ADL either performed or assessed with clinical judgement   ADL Overall ADL's : Needs assistance/impaired        Functional mobility during ADLs: Min guard;Rolling walker General ADL Comments: session focused on functional mobility and transfers; pt with no LOB this session, demonstrated good safety and endurance for room distances               Cognition Arousal/Alertness: Awake/alert Behavior During Therapy: WFL for tasks assessed/performed Overall Cognitive Status: Within Functional Limits for tasks assessed                   General Comments pt with reports of L ankle feeling better today, minimal pain, no new concerns noted    Pertinent Vitals/ Pain       Pain Assessment: Faces Faces Pain Scale: Hurts a little bit Pain Location: soreness throughout Pain Descriptors / Indicators: Discomfort Pain Intervention(s): Monitored during session         Frequency  Min 2X/week        Progress Toward Goals  OT  Goals(current goals can now be found in the care plan section)  Progress towards OT goals: Progressing toward goals  Acute Rehab OT Goals Patient Stated Goal: Be able to go home OT Goal Formulation: With patient Time For Goal Achievement: 11/04/20 Potential to Achieve Goals: Good ADL Goals Pt Will Perform Upper Body Bathing: with modified independence;sitting Pt Will Perform Lower Body Bathing: with modified independence;sit to/from  stand Pt Will Transfer to Toilet: with modified independence;ambulating Pt Will Perform Tub/Shower Transfer: with modified independence;shower seat;ambulating  Plan Discharge plan needs to be updated    Co-evaluation                 AM-PAC OT "6 Clicks" Daily Activity     Outcome Measure   Help from another person eating meals?: None Help from another person taking care of personal grooming?: A Little Help from another person toileting, which includes using toliet, bedpan, or urinal?: A Little Help from another person bathing (including washing, rinsing, drying)?: A Lot Help from another person to put on and taking off regular upper body clothing?: A Little Help from another person to put on and taking off regular lower body clothing?: A Lot 6 Click Score: 17    End of Session Equipment Utilized During Treatment: Gait belt;Rolling walker;Cervical collar  OT Visit Diagnosis: Unsteadiness on feet (R26.81);Other abnormalities of gait and mobility (R26.89);Repeated falls (R29.6);History of falling (Z91.81);Muscle weakness (generalized) (M62.81);Pain Pain - Right/Left: Left Pain - part of body: Ankle and joints of foot   Activity Tolerance Patient tolerated treatment well   Patient Left in chair;with call bell/phone within reach;with chair alarm set   Nurse Communication Mobility status        Time: 2111-5520 OT Time Calculation (min): 15 min  Charges: OT General Charges $OT Visit: 1 Visit OT Treatments $Therapeutic Activity: 8-22 mins     Brieann Osinski A Ayaan Shutes 10/25/2020, 4:48 PM

## 2020-10-25 NOTE — NC FL2 (Signed)
[place] MEDICAID FL2 LEVEL OF CARE SCREENING TOOL     IDENTIFICATION  Patient Name: [name] Birthdate: 1955/12/14 Sex: female Admission Date (Current Location): 10/14/2020  [facility] and [place] Number:  Producer, television/film/video and Address:  [facility], [address], [place], [place] [address]      Provider Number: [id number]  Attending Physician Name and Address:  Cathren Harsh, MD  Relative Name and Phone Number:  Maurine Cane, Daughter [phone]    Current Level of Care: Hospital Recommended Level of Care: Skilled Nursing Facility Prior Approval Number:    Date Approved/Denied:   PASRR Number: [id number]  Discharge Plan: SNF    Current Diagnoses: Patient Active Problem List   Diagnosis Date Noted  . Other spondylosis with myelopathy, cervical region 10/20/2020  . Cervical cord compression with myelopathy (HCC) 10/18/2020  . Spinal stenosis, cervical region 10/18/2020  . Polypharmacy 10/18/2020  . CAD (coronary artery disease) 10/18/2020  . Weakness 10/14/2020  . Fall at home, initial encounter 10/14/2020  . S/P total knee arthroplasty, 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  . Nephrolithiasis 07/08/2017  . Abnormal EKG 06/18/2017  . Preop cardiovascular exam 06/18/2017  . History of myocardial infarction 06/18/2017  . Chronic joint pain 10/08/2016  . Asthma 10/08/2016  . History of kidney stones 10/08/2016  . Insomnia 10/08/2016  . Diabetes mellitus type 2, noninsulin dependent (HCC) 09/10/2013  . Obesity (BMI 30-39.9) 09/10/2013  . GERD 03/15/2009    Orientation RESPIRATION BLADDER Height & Weight     Self,Time,Situation,Place  Normal Continent Weight: 164 lb (74.4 kg) Height:  5\' 3"  (160 cm)  BEHAVIORAL SYMPTOMS/MOOD NEUROLOGICAL BOWEL NUTRITION STATUS      Continent Diet (See D/C Summary)  AMBULATORY STATUS COMMUNICATION OF NEEDS Skin   Extensive Assist Verbally Surgical wounds (Surgical Incision see LDA)                        Personal Care Assistance Level of Assistance  Bathing,Feeding,Dressing Bathing Assistance: Limited assistance Feeding assistance: Independent Dressing Assistance: Limited assistance     Functional Limitations Info  Hearing,Sight,Speech Sight Info: Adequate Hearing Info: Adequate Speech Info: Adequate    SPECIAL CARE FACTORS FREQUENCY  PT (By licensed PT),OT (By licensed OT)     PT Frequency: 5x/week OT Frequency: 5x/week            Contractures Contractures Info: Not present    Additional Factors Info  Code Status,Allergies,Insulin Sliding Scale Code Status Info: Full Allergies Info: NKA   Insulin Sliding Scale Info: see med list       Current Medications (10/25/2020):  This is the current hospital active medication list Current Facility-Administered Medications  Medication Dose Route Frequency Provider Last Rate Last Admin  . albuterol (PROVENTIL) (2.5 MG/3ML) 0.083% nebulizer solution 2.5 mg  2.5 mg Nebulization Q6H PRN Kerrin Champagne, MD      . albuterol (VENTOLIN HFA) 108 (90 Base) MCG/ACT inhaler 2 puff  2 puff Inhalation Q4H PRN Kerrin Champagne, MD   2 puff at 10/16/20 [id number]  . alum & mag hydroxide-simeth (MAALOX/MYLANTA) 200-200-20 MG/5ML suspension 30 mL  30 mL Oral Q6H PRN Kerrin Champagne, MD      . amitriptyline (ELAVIL) tablet 10 mg  10 mg Oral QHS Kerrin Champagne, MD   10 mg at 10/24/20 2128  . ARIPiprazole (ABILIFY) tablet 5 mg  5 mg Oral Daily Kerrin Champagne, MD   5 mg at 10/25/20 0902  .  bisacodyl (DULCOLAX) EC tablet 5 mg  5 mg Oral Daily PRN Kerrin Champagne, MD      . docusate sodium (COLACE) capsule 100 mg  100 mg Oral BID Kerrin Champagne, MD   100 mg at 10/25/20 0902  . fenofibrate tablet 54 mg  54 mg Oral Daily Kerrin Champagne, MD   54 mg at 10/25/20 0902  . gabapentin (NEURONTIN) capsule 800 mg  800 mg Oral TID Kerrin Champagne, MD   800 mg at 10/25/20 0902  . ibuprofen (ADVIL) tablet 200 mg  200 mg Oral Q6H PRN Leatha Gilding, MD      . insulin  aspart (novoLOG) injection 0-15 Units  0-15 Units Subcutaneous TID WC Kerrin Champagne, MD   3 Units at 10/25/20 1217  . insulin aspart (novoLOG) injection 0-5 Units  0-5 Units Subcutaneous QHS Kerrin Champagne, MD   3 Units at 10/24/20 2124  . insulin aspart (novoLOG) injection 4 Units  4 Units Subcutaneous TID WC Leatha Gilding, MD   4 Units at 10/25/20 1217  . insulin detemir (LEVEMIR) injection 24 Units  24 Units Subcutaneous QHS Kerrin Champagne, MD   24 Units at 10/24/20 2125  . lip balm (CARMEX) ointment   Topical PRN Leatha Gilding, MD      . magnesium gluconate (MAGONATE) tablet 500 mg  500 mg Oral QHS Kerrin Champagne, MD   500 mg at 10/24/20 2129  . menthol-cetylpyridinium (CEPACOL) lozenge 3 mg  1 lozenge Oral PRN Kerrin Champagne, MD       Or  . phenol (CHLORASEPTIC) mouth spray 1 spray  1 spray Mouth/Throat PRN Kerrin Champagne, MD      . methocarbamol (ROBAXIN) tablet 500 mg  500 mg Oral Q6H PRN Kerrin Champagne, MD   500 mg at 10/24/20 2129   Or  . methocarbamol (ROBAXIN) 500 mg in dextrose 5 % 50 mL IVPB  500 mg Intravenous Q6H PRN Kerrin Champagne, MD      . morphine 2 MG/ML injection 1 mg  1 mg Intravenous Q2H PRN Kerrin Champagne, MD      . ondansetron [facility]) tablet 4 mg  4 mg Oral Q6H PRN Kerrin Champagne, MD   4 mg at 10/21/20 1742   Or  . ondansetron (ZOFRAN) injection 4 mg  4 mg Intravenous Q6H PRN Kerrin Champagne, MD      . oxyCODONE (Oxy IR/ROXICODONE) immediate release tablet 10 mg  10 mg Oral Q3H PRN Kerrin Champagne, MD   10 mg at 10/25/20 1111  . oxyCODONE (OXYCONTIN) 12 hr tablet 10 mg  10 mg Oral Q12H Kerrin Champagne, MD   10 mg at 10/25/20 0902  . pantoprazole (PROTONIX) EC tablet 40 mg  40 mg Oral QHS [place], Yujing Z, RPH   40 mg at 10/24/20 2129  . polyethylene glycol (MIRALAX / GLYCOLAX) packet 17 g  17 g Oral Daily PRN Kerrin Champagne, MD      . pravastatin (PRAVACHOL) tablet 80 mg  80 mg Oral Daily Kerrin Champagne, MD   80 mg at 10/25/20 0902  . sertraline (ZOLOFT) tablet  100 mg  100 mg Oral QHS Kerrin Champagne, MD   100 mg at 10/24/20 2129  . vitamin B-12 (CYANOCOBALAMIN) tablet 1,000 mcg  1,000 mcg Oral Daily Kerrin Champagne, MD   1,000 mcg at 10/25/20 1610     Discharge Medications: Please see discharge summary  for a list of discharge medications.  Relevant Imaging Results:  Relevant Lab Results:   Additional Information SS#: [id number] Pfizer COVID-19 Vaccine 05/02/2020 , 09/16/2019 , 08/24/2019  Delorice Bannister, LCSWA

## 2020-10-25 NOTE — Progress Notes (Signed)
[employer] [facility]: [id number].  Approved 10/25/2020 - 10/27/2020, with next review 10/27/2020.  Will need to update [employer] once SNF facility known.

## 2020-10-25 NOTE — Progress Notes (Signed)
Patient ID: [name], female   DOB: 06-Dec-1955, 65 y.o.   MRN: [id number] I discusses this patient's case with her insurance co peer to peer, Dr. Sunny Schlein Pixler.. She in looking over notes recommends SNF placement as opposed to acute rehab. I believe either CIR or SNF is acceptable so will place order for SNF. She indicates that she will Approve for SNF due to persistent LE weakness, slow rehab and balance difficulties.  I will place order for SNF.

## 2020-10-25 NOTE — Progress Notes (Signed)
Inpatient Diabetes Program Recommendations  AACE/ADA: New Consensus Statement on Inpatient Glycemic Control (2015)  Target Ranges:  Prepandial:   less than 140 mg/dL      Peak postprandial:   less than 180 mg/dL (1-2 hours)      Critically ill patients:  140 - 180 mg/dL   Lab Results  Component Value Date   GLUCAP 156 (H) 10/25/2020   HGBA1C 8.2 (H) 10/15/2020    Review of Glycemic Control Results for [name], [name] (MRN [id number]) as of 10/25/2020 11:18  Ref. Range 10/24/2020 06:37 10/24/2020 11:15 10/24/2020 16:10 10/24/2020 20:08 10/25/2020 06:33  Glucose-Capillary Latest Ref Range: 70 - 99 mg/dL 657 (H) 903 (H) 833 (H) 265 (H) 156 (H)    Inpatient Diabetes Program Recommendations:     Please consider increasing meal coverage:  Novolog 6 units TID with meals.  Will continue to follow while inpatient.  Thank you, Dulce Sellar, RN, BSN Diabetes Coordinator Inpatient Diabetes Program [phone] (team pager from 8a-5p)

## 2020-10-25 NOTE — Progress Notes (Signed)
SNF authorization initiated in [employer] portal.  Initial clinical submitted.

## 2020-10-25 NOTE — Progress Notes (Signed)
     Subjective: 5 Days Post-Op Procedure(s) (LRB): C5-6, C6-7 ANTERIOR CERVICAL DISCECTOMY, C6 CORPECTOMY, C5-C7 PLATE, ALLOGRAFT (N/A) Awake, alert and oriented x 4. Walking with stand by and some belt assistance. She reports that the legs giving away is better. PT noted some tendency to loss of balance with turning and twisting to get upright.  BM post enema, stool softeners.   Patient reports pain as mild.    Objective:   VITALS:  Temp:  [98.3 F (36.8 C)-98.6 F (37 C)] 98.3 F (36.8 C) (06/01 0759) Pulse Rate:  [45-48] 48 (06/01 0759) Resp:  [16-18] 18 (06/01 0759) BP: (105-117)/(41-45) 105/41 (06/01 0759) SpO2:  [95 %-96 %] 96 % (06/01 0759)  Neurologically intact ABD soft Neurovascular intact Sensation intact distally Intact pulses distally Dorsiflexion/Plantar flexion intact Incision: dressing C/D/I and no drainage   LABS Recent Labs    10/24/20 0152 10/25/20 0633  HGB 10.1* 10.2*  WBC 11.2* 10.0  PLT 272 304   Recent Labs    10/24/20 0152 10/25/20 0633  NA 137 138  K 3.6 3.9  CL 97* 97*  CO2 32 32  BUN 17 16  CREATININE 0.65 0.71  GLUCOSE 123* 142*   No results for input(s): LABPT, INR in the last 72 hours.   Assessment/Plan: 5 Days Post-Op Procedure(s) (LRB): C5-6, C6-7 ANTERIOR CERVICAL DISCECTOMY, C6 CORPECTOMY, C5-C7 PLATE, ALLOGRAFT (N/A)  Advance diet Up with therapy D/C IV fluids Discharge to SNF when one is available.   [name] 10/25/2020, 2:06 PMPatient ID: [name], female   DOB: 1956-03-12, 65 y.o.   MRN: [id number]

## 2020-10-25 NOTE — Progress Notes (Signed)
Inpatient Rehab Admissions Coordinator:   Insurance has denied request for CIR admission. CIR to sign off. TOC notified, working on SNF rehab.  Megan Salon, MS, CCC-SLP Rehab Admissions Coordinator  [phone] (celll) [phone] (office)

## 2020-10-25 NOTE — Telephone Encounter (Signed)
Dr. Otelia Sergeant did peer- to -peer and she has been approved for SNF, he put an order in the computer for that

## 2020-10-25 NOTE — Care Management Important Message (Signed)
Important Message  Patient Details  Name: [name] MRN: [id number] Date of Birth: 07/07/1955   Medicare Important Message Given:  Yes     Oralia Rud Virgia Kelner 10/25/2020, 12:56 PM

## 2020-10-25 NOTE — Telephone Encounter (Signed)
PT needs a [place] to [place] that needs to be completed by 11:30 this morning for [facility]. Call [phone] and select 5, pt DOB 10-May-1956 and MBI [phone]. Recommend inpatient rehab for this pt. Shestates she sent the directions to Dr.Nitka in a secured chat  Sherryle Lis [phone]

## 2020-10-25 NOTE — Progress Notes (Signed)
[facility]                                                                              Patient Demographics  [name], is a 65 y.o. female, DOB - 1955/05/31, ZOX:[id number]  Admit date - 10/14/2020   Admitting Physician Rometta Emery, MD  Outpatient Primary MD for the patient is Courtney Paris, NP  Outpatient specialists:   LOS - 11  days   Medical records reviewed and are as summarized below:    Chief Complaint  Patient presents with  . Fall       Brief summary   Patient is a 65 year old woman with diabetes, CAD, OA, comes into the hospital with multiple falls at home, right leg weakness.  Patient also reported decreased sensation in her hands.  Lumbar spine MRI showed multilevel lumbar spondylosis L1-2 and L4-5, moderate canal stenosis and severe bilateral foraminal stenosis L4-5 L5-S1.  C-spine MRI was done due to hand involvement which showed severe spinal stenosis and cord compression at C5-6 level possibly C6-7.  Orthopedic surgery was consulted Patient was transferred from [facility] to [facility].   Assessment & Plan    Principal Problem:   Spinal stenosis, cervical region, cord compression at C5-C6 C6-C7 -Orthopedic surgery was consulted -Underwent C5-C6, C6-C7 anterior cervical discectomy, C6 corpectomy, C5 -7 plate allograft on 5/27 (Dr. Otelia Sergeant) -Per patient, overall strength now improving postoperatively. -Continue pain control, bowel regimen, PT OT -Denied by insurance for CIR, plan for SNF  Active problems Chronic low back pain -Moderate canal stenosis and severe bilateral foraminal stenosis L4-5, L5-S1 -Continue pain control, conservative management  Frequent PVCs, with prior history of PVCs, bradycardia -Asymptomatic.  Follow BMET -Echo showed normal EF, no WMA, grade 1 diastolic dysfunction -Outpatient cardiology followup  CAD -Currently no acute complaints.  No recent cardiac symptoms.  Diabetes mellitus type 2,  insulin-dependent, poorly controlled with hyperglycemia and peripheral neuropathy -Continue Levemir 24 units at bedtime, sliding scale insulin, NovoLog meal coverage 4 units 3 times daily AC -Hemoglobin A1c 8.2 on 5/22   Code Status: Full CODE STATUS DVT Prophylaxis:  SCD's Start: 10/21/20 0051   Level of Care: Level of care: Med-Surg Family Communication: Discussed all imaging results, lab results, explained to the patient    Disposition Plan:     Status is: Inpatient  Remains inpatient appropriate because:Inpatient level of care appropriate due to severity of illness   Dispo: The patient is from: Home              Anticipated d/c is to: SNF              Patient currently is not medically stable to d/c.  Denied by insurance for CIR, now awaiting SNF   Difficult to place patient No      Time Spent in minutes    Procedures:  C5-C6, C6-C7 anterior cervical discectomy, C6 corpectomy, C5 -7 plate allograft on 5/27 (Dr. Otelia Sergeant)   Consultants:   Baird Lyons, Dr. Otelia Sergeant  Antimicrobials:   Anti-infectives (From admission, onward)   Start     Dose/Rate Route Frequency Ordered Stop   10/21/20 0600  Severe left with moderate right C4 foraminal narrowing. C4-C5: Mild disc bulge with uncovertebral and endplate spurring. Posterior disc osteophyte flattens and partially effaces the ventral thecal sac with no more than mild spinal stenosis. No cord impingement. Superimposed left-sided facet degeneration. Severe bilateral C5 foraminal stenosis. C5-C6: Degenerative intervertebral disc space narrowing with diffuse disc osteophyte complex. Broad posterior component flattens and effaces the ventral thecal sac. Secondary severe spinal stenosis with compression of the cervical spinal cord and probable cord signal changes. Thecal sac narrowed to approximately 2 mm in AP diameter at its most narrow point (series 7, image 25). Severe bilateral C6 foraminal narrowing. C6-C7: Degenerative intervertebral disc space narrowing with diffuse disc osteophyte complex. Broad posterior component flattens and effaces the ventral thecal sac, asymmetric to the right. Resultant severe spinal stenosis with cord flattening. Possible subtle patchy cord signal changes at this level, not entirely certain on this motion degraded exam. Thecal sac measures 5 mm in AP diameter at its most narrow point. Severe right worse than left C7 foraminal stenosis. C7-T1: Degenerative intervertebral disc space narrowing with mild disc bulge and uncovertebral spurring. No  significant spinal stenosis. Foramina appear patent. Visualized upper thoracic spine demonstrates no other significant finding. IMPRESSION: 1. 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 Suspected patchy cord signal abnormality at C5-6 and possibly C6-7, suggesting myelopathy. 2. No other acute abnormality within the cervical spine. 3. Additional multifactorial degenerative changes with resultant severe bilateral C4 through C7 foraminal stenosis as above. Findings communicated to the covering physician for this patient Dr. Rachael Darby at 4:30 a.m. on 10/17/2020. Electronically Signed   By: Rise Mu M.D.   On: 10/17/2020 04:45   DG C-Arm 1-60 Min  Result Date: 10/20/2020 CLINICAL DATA:  Surgery, elective. Additional history provided: C5-C6, C6-7 anterior cervical discectomy, C6 corpectomy, C5-C7 plate, allograft. Provided fluoroscopy time 23 seconds (7.51 mGy). EXAM: CERVICAL SPINE - 2-3 VIEW; DG C-ARM 1-60 MIN COMPARISON:  Cervical spine MRI 10/16/2020. FINDINGS: PA and lateral view intraoperative fluoroscopic images of the cervical spine are submitted, 4 images total. On the provided images, ACDF hardware spans the C5-C7 levels (ventral plate with screws at C5 and C7). ET tube present with tip terminating at the level of the clavicular heads. IMPRESSION: Four intraoperative fluoroscopic images of the cervical spine, as described. Correlate with the procedural history. Electronically Signed   By: Jackey Loge DO   On: 10/20/2020 18:27   ECHOCARDIOGRAM COMPLETE  Result Date: 10/23/2020    ECHOCARDIOGRAM REPORT   Patient Name:   [name] Date of Exam: 10/23/2020 Medical Rec #:  [id number]    Height:       63.0 in Accession #:    [id number]   Weight:       164.0 lb Date of Birth:  [birth year]    BSA:          1.777 m Patient Age:    64 years     BP:           115/54 mmHg Patient Gender: F            HR:           100  bpm. Exam Location:  Inpatient Procedure: 2D Echo, Cardiac Doppler and Color Doppler Indications:    R94.31 Abnormal EKG  History:        Patient has no prior history of Echocardiogram examinations.  Sonographer:    Roosvelt Maser  Severe left with moderate right C4 foraminal narrowing. C4-C5: Mild disc bulge with uncovertebral and endplate spurring. Posterior disc osteophyte flattens and partially effaces the ventral thecal sac with no more than mild spinal stenosis. No cord impingement. Superimposed left-sided facet degeneration. Severe bilateral C5 foraminal stenosis. C5-C6: Degenerative intervertebral disc space narrowing with diffuse disc osteophyte complex. Broad posterior component flattens and effaces the ventral thecal sac. Secondary severe spinal stenosis with compression of the cervical spinal cord and probable cord signal changes. Thecal sac narrowed to approximately 2 mm in AP diameter at its most narrow point (series 7, image 25). Severe bilateral C6 foraminal narrowing. C6-C7: Degenerative intervertebral disc space narrowing with diffuse disc osteophyte complex. Broad posterior component flattens and effaces the ventral thecal sac, asymmetric to the right. Resultant severe spinal stenosis with cord flattening. Possible subtle patchy cord signal changes at this level, not entirely certain on this motion degraded exam. Thecal sac measures 5 mm in AP diameter at its most narrow point. Severe right worse than left C7 foraminal stenosis. C7-T1: Degenerative intervertebral disc space narrowing with mild disc bulge and uncovertebral spurring. No  significant spinal stenosis. Foramina appear patent. Visualized upper thoracic spine demonstrates no other significant finding. IMPRESSION: 1. 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 Suspected patchy cord signal abnormality at C5-6 and possibly C6-7, suggesting myelopathy. 2. No other acute abnormality within the cervical spine. 3. Additional multifactorial degenerative changes with resultant severe bilateral C4 through C7 foraminal stenosis as above. Findings communicated to the covering physician for this patient Dr. Rachael Darby at 4:30 a.m. on 10/17/2020. Electronically Signed   By: Rise Mu M.D.   On: 10/17/2020 04:45   DG C-Arm 1-60 Min  Result Date: 10/20/2020 CLINICAL DATA:  Surgery, elective. Additional history provided: C5-C6, C6-7 anterior cervical discectomy, C6 corpectomy, C5-C7 plate, allograft. Provided fluoroscopy time 23 seconds (7.51 mGy). EXAM: CERVICAL SPINE - 2-3 VIEW; DG C-ARM 1-60 MIN COMPARISON:  Cervical spine MRI 10/16/2020. FINDINGS: PA and lateral view intraoperative fluoroscopic images of the cervical spine are submitted, 4 images total. On the provided images, ACDF hardware spans the C5-C7 levels (ventral plate with screws at C5 and C7). ET tube present with tip terminating at the level of the clavicular heads. IMPRESSION: Four intraoperative fluoroscopic images of the cervical spine, as described. Correlate with the procedural history. Electronically Signed   By: Jackey Loge DO   On: 10/20/2020 18:27   ECHOCARDIOGRAM COMPLETE  Result Date: 10/23/2020    ECHOCARDIOGRAM REPORT   Patient Name:   [name] Date of Exam: 10/23/2020 Medical Rec #:  [id number]    Height:       63.0 in Accession #:    [id number]   Weight:       164.0 lb Date of Birth:  [birth year]    BSA:          1.777 m Patient Age:    64 years     BP:           115/54 mmHg Patient Gender: F            HR:           100  bpm. Exam Location:  Inpatient Procedure: 2D Echo, Cardiac Doppler and Color Doppler Indications:    R94.31 Abnormal EKG  History:        Patient has no prior history of Echocardiogram examinations.  Sonographer:    Roosvelt Maser  [facility]                                                                              Patient Demographics  [name], is a 65 y.o. female, DOB - 1955/05/31, ZOX:[id number]  Admit date - 10/14/2020   Admitting Physician Rometta Emery, MD  Outpatient Primary MD for the patient is Courtney Paris, NP  Outpatient specialists:   LOS - 11  days   Medical records reviewed and are as summarized below:    Chief Complaint  Patient presents with  . Fall       Brief summary   Patient is a 65 year old woman with diabetes, CAD, OA, comes into the hospital with multiple falls at home, right leg weakness.  Patient also reported decreased sensation in her hands.  Lumbar spine MRI showed multilevel lumbar spondylosis L1-2 and L4-5, moderate canal stenosis and severe bilateral foraminal stenosis L4-5 L5-S1.  C-spine MRI was done due to hand involvement which showed severe spinal stenosis and cord compression at C5-6 level possibly C6-7.  Orthopedic surgery was consulted Patient was transferred from [facility] to [facility].   Assessment & Plan    Principal Problem:   Spinal stenosis, cervical region, cord compression at C5-C6 C6-C7 -Orthopedic surgery was consulted -Underwent C5-C6, C6-C7 anterior cervical discectomy, C6 corpectomy, C5 -7 plate allograft on 5/27 (Dr. Otelia Sergeant) -Per patient, overall strength now improving postoperatively. -Continue pain control, bowel regimen, PT OT -Denied by insurance for CIR, plan for SNF  Active problems Chronic low back pain -Moderate canal stenosis and severe bilateral foraminal stenosis L4-5, L5-S1 -Continue pain control, conservative management  Frequent PVCs, with prior history of PVCs, bradycardia -Asymptomatic.  Follow BMET -Echo showed normal EF, no WMA, grade 1 diastolic dysfunction -Outpatient cardiology followup  CAD -Currently no acute complaints.  No recent cardiac symptoms.  Diabetes mellitus type 2,  insulin-dependent, poorly controlled with hyperglycemia and peripheral neuropathy -Continue Levemir 24 units at bedtime, sliding scale insulin, NovoLog meal coverage 4 units 3 times daily AC -Hemoglobin A1c 8.2 on 5/22   Code Status: Full CODE STATUS DVT Prophylaxis:  SCD's Start: 10/21/20 0051   Level of Care: Level of care: Med-Surg Family Communication: Discussed all imaging results, lab results, explained to the patient    Disposition Plan:     Status is: Inpatient  Remains inpatient appropriate because:Inpatient level of care appropriate due to severity of illness   Dispo: The patient is from: Home              Anticipated d/c is to: SNF              Patient currently is not medically stable to d/c.  Denied by insurance for CIR, now awaiting SNF   Difficult to place patient No      Time Spent in minutes    Procedures:  C5-C6, C6-C7 anterior cervical discectomy, C6 corpectomy, C5 -7 plate allograft on 5/27 (Dr. Otelia Sergeant)   Consultants:   Baird Lyons, Dr. Otelia Sergeant  Antimicrobials:   Anti-infectives (From admission, onward)   Start     Dose/Rate Route Frequency Ordered Stop   10/21/20 0600  Severe left with moderate right C4 foraminal narrowing. C4-C5: Mild disc bulge with uncovertebral and endplate spurring. Posterior disc osteophyte flattens and partially effaces the ventral thecal sac with no more than mild spinal stenosis. No cord impingement. Superimposed left-sided facet degeneration. Severe bilateral C5 foraminal stenosis. C5-C6: Degenerative intervertebral disc space narrowing with diffuse disc osteophyte complex. Broad posterior component flattens and effaces the ventral thecal sac. Secondary severe spinal stenosis with compression of the cervical spinal cord and probable cord signal changes. Thecal sac narrowed to approximately 2 mm in AP diameter at its most narrow point (series 7, image 25). Severe bilateral C6 foraminal narrowing. C6-C7: Degenerative intervertebral disc space narrowing with diffuse disc osteophyte complex. Broad posterior component flattens and effaces the ventral thecal sac, asymmetric to the right. Resultant severe spinal stenosis with cord flattening. Possible subtle patchy cord signal changes at this level, not entirely certain on this motion degraded exam. Thecal sac measures 5 mm in AP diameter at its most narrow point. Severe right worse than left C7 foraminal stenosis. C7-T1: Degenerative intervertebral disc space narrowing with mild disc bulge and uncovertebral spurring. No  significant spinal stenosis. Foramina appear patent. Visualized upper thoracic spine demonstrates no other significant finding. IMPRESSION: 1. 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 Suspected patchy cord signal abnormality at C5-6 and possibly C6-7, suggesting myelopathy. 2. No other acute abnormality within the cervical spine. 3. Additional multifactorial degenerative changes with resultant severe bilateral C4 through C7 foraminal stenosis as above. Findings communicated to the covering physician for this patient Dr. Rachael Darby at 4:30 a.m. on 10/17/2020. Electronically Signed   By: Rise Mu M.D.   On: 10/17/2020 04:45   DG C-Arm 1-60 Min  Result Date: 10/20/2020 CLINICAL DATA:  Surgery, elective. Additional history provided: C5-C6, C6-7 anterior cervical discectomy, C6 corpectomy, C5-C7 plate, allograft. Provided fluoroscopy time 23 seconds (7.51 mGy). EXAM: CERVICAL SPINE - 2-3 VIEW; DG C-ARM 1-60 MIN COMPARISON:  Cervical spine MRI 10/16/2020. FINDINGS: PA and lateral view intraoperative fluoroscopic images of the cervical spine are submitted, 4 images total. On the provided images, ACDF hardware spans the C5-C7 levels (ventral plate with screws at C5 and C7). ET tube present with tip terminating at the level of the clavicular heads. IMPRESSION: Four intraoperative fluoroscopic images of the cervical spine, as described. Correlate with the procedural history. Electronically Signed   By: Jackey Loge DO   On: 10/20/2020 18:27   ECHOCARDIOGRAM COMPLETE  Result Date: 10/23/2020    ECHOCARDIOGRAM REPORT   Patient Name:   [name] Date of Exam: 10/23/2020 Medical Rec #:  [id number]    Height:       63.0 in Accession #:    [id number]   Weight:       164.0 lb Date of Birth:  [birth year]    BSA:          1.777 m Patient Age:    64 years     BP:           115/54 mmHg Patient Gender: F            HR:           100  bpm. Exam Location:  Inpatient Procedure: 2D Echo, Cardiac Doppler and Color Doppler Indications:    R94.31 Abnormal EKG  History:        Patient has no prior history of Echocardiogram examinations.  Sonographer:    Roosvelt Maser  Severe left with moderate right C4 foraminal narrowing. C4-C5: Mild disc bulge with uncovertebral and endplate spurring. Posterior disc osteophyte flattens and partially effaces the ventral thecal sac with no more than mild spinal stenosis. No cord impingement. Superimposed left-sided facet degeneration. Severe bilateral C5 foraminal stenosis. C5-C6: Degenerative intervertebral disc space narrowing with diffuse disc osteophyte complex. Broad posterior component flattens and effaces the ventral thecal sac. Secondary severe spinal stenosis with compression of the cervical spinal cord and probable cord signal changes. Thecal sac narrowed to approximately 2 mm in AP diameter at its most narrow point (series 7, image 25). Severe bilateral C6 foraminal narrowing. C6-C7: Degenerative intervertebral disc space narrowing with diffuse disc osteophyte complex. Broad posterior component flattens and effaces the ventral thecal sac, asymmetric to the right. Resultant severe spinal stenosis with cord flattening. Possible subtle patchy cord signal changes at this level, not entirely certain on this motion degraded exam. Thecal sac measures 5 mm in AP diameter at its most narrow point. Severe right worse than left C7 foraminal stenosis. C7-T1: Degenerative intervertebral disc space narrowing with mild disc bulge and uncovertebral spurring. No  significant spinal stenosis. Foramina appear patent. Visualized upper thoracic spine demonstrates no other significant finding. IMPRESSION: 1. 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 Suspected patchy cord signal abnormality at C5-6 and possibly C6-7, suggesting myelopathy. 2. No other acute abnormality within the cervical spine. 3. Additional multifactorial degenerative changes with resultant severe bilateral C4 through C7 foraminal stenosis as above. Findings communicated to the covering physician for this patient Dr. Rachael Darby at 4:30 a.m. on 10/17/2020. Electronically Signed   By: Rise Mu M.D.   On: 10/17/2020 04:45   DG C-Arm 1-60 Min  Result Date: 10/20/2020 CLINICAL DATA:  Surgery, elective. Additional history provided: C5-C6, C6-7 anterior cervical discectomy, C6 corpectomy, C5-C7 plate, allograft. Provided fluoroscopy time 23 seconds (7.51 mGy). EXAM: CERVICAL SPINE - 2-3 VIEW; DG C-ARM 1-60 MIN COMPARISON:  Cervical spine MRI 10/16/2020. FINDINGS: PA and lateral view intraoperative fluoroscopic images of the cervical spine are submitted, 4 images total. On the provided images, ACDF hardware spans the C5-C7 levels (ventral plate with screws at C5 and C7). ET tube present with tip terminating at the level of the clavicular heads. IMPRESSION: Four intraoperative fluoroscopic images of the cervical spine, as described. Correlate with the procedural history. Electronically Signed   By: Jackey Loge DO   On: 10/20/2020 18:27   ECHOCARDIOGRAM COMPLETE  Result Date: 10/23/2020    ECHOCARDIOGRAM REPORT   Patient Name:   [name] Date of Exam: 10/23/2020 Medical Rec #:  [id number]    Height:       63.0 in Accession #:    [id number]   Weight:       164.0 lb Date of Birth:  [birth year]    BSA:          1.777 m Patient Age:    64 years     BP:           115/54 mmHg Patient Gender: F            HR:           100  bpm. Exam Location:  Inpatient Procedure: 2D Echo, Cardiac Doppler and Color Doppler Indications:    R94.31 Abnormal EKG  History:        Patient has no prior history of Echocardiogram examinations.  Sonographer:    Roosvelt Maser  Severe left with moderate right C4 foraminal narrowing. C4-C5: Mild disc bulge with uncovertebral and endplate spurring. Posterior disc osteophyte flattens and partially effaces the ventral thecal sac with no more than mild spinal stenosis. No cord impingement. Superimposed left-sided facet degeneration. Severe bilateral C5 foraminal stenosis. C5-C6: Degenerative intervertebral disc space narrowing with diffuse disc osteophyte complex. Broad posterior component flattens and effaces the ventral thecal sac. Secondary severe spinal stenosis with compression of the cervical spinal cord and probable cord signal changes. Thecal sac narrowed to approximately 2 mm in AP diameter at its most narrow point (series 7, image 25). Severe bilateral C6 foraminal narrowing. C6-C7: Degenerative intervertebral disc space narrowing with diffuse disc osteophyte complex. Broad posterior component flattens and effaces the ventral thecal sac, asymmetric to the right. Resultant severe spinal stenosis with cord flattening. Possible subtle patchy cord signal changes at this level, not entirely certain on this motion degraded exam. Thecal sac measures 5 mm in AP diameter at its most narrow point. Severe right worse than left C7 foraminal stenosis. C7-T1: Degenerative intervertebral disc space narrowing with mild disc bulge and uncovertebral spurring. No  significant spinal stenosis. Foramina appear patent. Visualized upper thoracic spine demonstrates no other significant finding. IMPRESSION: 1. 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 Suspected patchy cord signal abnormality at C5-6 and possibly C6-7, suggesting myelopathy. 2. No other acute abnormality within the cervical spine. 3. Additional multifactorial degenerative changes with resultant severe bilateral C4 through C7 foraminal stenosis as above. Findings communicated to the covering physician for this patient Dr. Rachael Darby at 4:30 a.m. on 10/17/2020. Electronically Signed   By: Rise Mu M.D.   On: 10/17/2020 04:45   DG C-Arm 1-60 Min  Result Date: 10/20/2020 CLINICAL DATA:  Surgery, elective. Additional history provided: C5-C6, C6-7 anterior cervical discectomy, C6 corpectomy, C5-C7 plate, allograft. Provided fluoroscopy time 23 seconds (7.51 mGy). EXAM: CERVICAL SPINE - 2-3 VIEW; DG C-ARM 1-60 MIN COMPARISON:  Cervical spine MRI 10/16/2020. FINDINGS: PA and lateral view intraoperative fluoroscopic images of the cervical spine are submitted, 4 images total. On the provided images, ACDF hardware spans the C5-C7 levels (ventral plate with screws at C5 and C7). ET tube present with tip terminating at the level of the clavicular heads. IMPRESSION: Four intraoperative fluoroscopic images of the cervical spine, as described. Correlate with the procedural history. Electronically Signed   By: Jackey Loge DO   On: 10/20/2020 18:27   ECHOCARDIOGRAM COMPLETE  Result Date: 10/23/2020    ECHOCARDIOGRAM REPORT   Patient Name:   [name] Date of Exam: 10/23/2020 Medical Rec #:  [id number]    Height:       63.0 in Accession #:    [id number]   Weight:       164.0 lb Date of Birth:  [birth year]    BSA:          1.777 m Patient Age:    64 years     BP:           115/54 mmHg Patient Gender: F            HR:           100  bpm. Exam Location:  Inpatient Procedure: 2D Echo, Cardiac Doppler and Color Doppler Indications:    R94.31 Abnormal EKG  History:        Patient has no prior history of Echocardiogram examinations.  Sonographer:    Roosvelt Maser  Severe left with moderate right C4 foraminal narrowing. C4-C5: Mild disc bulge with uncovertebral and endplate spurring. Posterior disc osteophyte flattens and partially effaces the ventral thecal sac with no more than mild spinal stenosis. No cord impingement. Superimposed left-sided facet degeneration. Severe bilateral C5 foraminal stenosis. C5-C6: Degenerative intervertebral disc space narrowing with diffuse disc osteophyte complex. Broad posterior component flattens and effaces the ventral thecal sac. Secondary severe spinal stenosis with compression of the cervical spinal cord and probable cord signal changes. Thecal sac narrowed to approximately 2 mm in AP diameter at its most narrow point (series 7, image 25). Severe bilateral C6 foraminal narrowing. C6-C7: Degenerative intervertebral disc space narrowing with diffuse disc osteophyte complex. Broad posterior component flattens and effaces the ventral thecal sac, asymmetric to the right. Resultant severe spinal stenosis with cord flattening. Possible subtle patchy cord signal changes at this level, not entirely certain on this motion degraded exam. Thecal sac measures 5 mm in AP diameter at its most narrow point. Severe right worse than left C7 foraminal stenosis. C7-T1: Degenerative intervertebral disc space narrowing with mild disc bulge and uncovertebral spurring. No  significant spinal stenosis. Foramina appear patent. Visualized upper thoracic spine demonstrates no other significant finding. IMPRESSION: 1. 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 Suspected patchy cord signal abnormality at C5-6 and possibly C6-7, suggesting myelopathy. 2. No other acute abnormality within the cervical spine. 3. Additional multifactorial degenerative changes with resultant severe bilateral C4 through C7 foraminal stenosis as above. Findings communicated to the covering physician for this patient Dr. Rachael Darby at 4:30 a.m. on 10/17/2020. Electronically Signed   By: Rise Mu M.D.   On: 10/17/2020 04:45   DG C-Arm 1-60 Min  Result Date: 10/20/2020 CLINICAL DATA:  Surgery, elective. Additional history provided: C5-C6, C6-7 anterior cervical discectomy, C6 corpectomy, C5-C7 plate, allograft. Provided fluoroscopy time 23 seconds (7.51 mGy). EXAM: CERVICAL SPINE - 2-3 VIEW; DG C-ARM 1-60 MIN COMPARISON:  Cervical spine MRI 10/16/2020. FINDINGS: PA and lateral view intraoperative fluoroscopic images of the cervical spine are submitted, 4 images total. On the provided images, ACDF hardware spans the C5-C7 levels (ventral plate with screws at C5 and C7). ET tube present with tip terminating at the level of the clavicular heads. IMPRESSION: Four intraoperative fluoroscopic images of the cervical spine, as described. Correlate with the procedural history. Electronically Signed   By: Jackey Loge DO   On: 10/20/2020 18:27   ECHOCARDIOGRAM COMPLETE  Result Date: 10/23/2020    ECHOCARDIOGRAM REPORT   Patient Name:   [name] Date of Exam: 10/23/2020 Medical Rec #:  [id number]    Height:       63.0 in Accession #:    [id number]   Weight:       164.0 lb Date of Birth:  [birth year]    BSA:          1.777 m Patient Age:    64 years     BP:           115/54 mmHg Patient Gender: F            HR:           100  bpm. Exam Location:  Inpatient Procedure: 2D Echo, Cardiac Doppler and Color Doppler Indications:    R94.31 Abnormal EKG  History:        Patient has no prior history of Echocardiogram examinations.  Sonographer:    Roosvelt Maser

## 2020-10-25 NOTE — Progress Notes (Signed)
Physical Therapy Treatment Patient Details Name: [name] MRN: [id number] DOB: 14-Jul-1955 Today's Date: 10/25/2020    History of Present Illness Pt is a 65 y/o female who presents with multiple falls at home. She is now s/p C5-C7 ACDF on 10/20/2020. 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    PT Comments    Pt seated in recliner chair on arrival this session.  Cervical brace appeared loose and needed assistance to tighten for improved fit.  Pt able to recall 1/3 precautions.  Continue to recommend rehab in a post acute setting.     Follow Up Recommendations  CIR     Equipment Recommendations  None recommended by PT    Recommendations for Other Services Rehab consult     Precautions / Restrictions Precautions Precautions: Cervical;Fall Precaution Booklet Issued: Yes (comment) Precaution Comments: Required cues to recall only able to recall 1/3 precautions. Required Braces or Orthoses: Cervical Brace Cervical Brace: Hard collar;At all times Restrictions Weight Bearing Restrictions: No Other Position/Activity Restrictions: lifting restrictions <5 pounds    Mobility  Bed Mobility Overal bed mobility: Needs Assistance Bed Mobility: Rolling;Sit to Sidelying Rolling: Supervision Sidelying to sit: Supervision     Sit to sidelying: Min assist General bed mobility comments: Min assistance to lift B LEs back into bed against gravity.  tacile cues to roll and avoid twisting.    Transfers Overall transfer level: Needs assistance Equipment used: Rolling walker (2 wheeled) Transfers: Sit to/from Stand Sit to Stand: Min guard         General transfer comment: Cues for hand placement and safety.  Ambulation/Gait Ambulation/Gait assistance: Min assist Gait Distance (Feet): 100 Feet Assistive device: Rolling walker (2 wheeled) Gait  Pattern/deviations: Step-through pattern;Decreased stride length;Narrow base of support     General Gait Details: Mild unsteadiness but no buckling or overt LOB.  Cues for posture and RW safety.   Stairs             Wheelchair Mobility    Modified Rankin (Stroke Patients Only)       Balance Overall balance assessment: Needs assistance Sitting-balance support: No upper extremity supported;Feet supported Sitting balance-Leahy Scale: Fair Sitting balance - Comments: pt able to don socks sitting EOB, increased posterior lean but no LOB   Standing balance support: Bilateral upper extremity supported;During functional activity;Single extremity supported Standing balance-Leahy Scale: Poor Standing balance comment: heavy UE support                            Cognition Arousal/Alertness: Awake/alert Behavior During Therapy: WFL for tasks assessed/performed Overall Cognitive Status: Within Functional Limits for tasks assessed                                 General Comments: difficulty with recall of precautions      Exercises      General Comments General comments (skin integrity, edema, etc.): pt with reports of L ankle feeling better today, minimal pain, no new concerns noted      Pertinent Vitals/Pain Pain Assessment: Faces Faces Pain Scale: Hurts a little bit Pain Location: soreness throughout Pain Descriptors / Indicators: Discomfort Pain Intervention(s): Monitored during session;Repositioned    Home Living  Prior Function            PT Goals (current goals can now be found in the care plan section) Acute Rehab PT Goals Patient Stated Goal: Be able to go home Potential to Achieve Goals: Good Progress towards PT goals: Progressing toward goals    Frequency    Min 5X/week      PT Plan Frequency needs to be updated    Co-evaluation              AM-PAC PT "6 Clicks" Mobility   Outcome  Measure  Help needed turning from your back to your side while in a flat bed without using bedrails?: A Little Help needed moving from lying on your back to sitting on the side of a flat bed without using bedrails?: A Little Help needed moving to and from a bed to a chair (including a wheelchair)?: A Little Help needed standing up from a chair using your arms (e.g., wheelchair or bedside chair)?: A Little Help needed to walk in hospital room?: A Little Help needed climbing 3-5 steps with a railing? : A Lot 6 Click Score: 17    End of Session Equipment Utilized During Treatment: Gait belt Activity Tolerance: Patient tolerated treatment well Patient left: in chair;with call bell/phone within reach;with chair alarm set Nurse Communication: Mobility status PT Visit Diagnosis: Other abnormalities of gait and mobility (R26.89);Muscle weakness (generalized) (M62.81)     Time: 5784-6962 PT Time Calculation (min) (ACUTE ONLY): 16 min  Charges:  $Gait Training: 8-22 mins                     Bonney Leitz , PTA Acute Rehabilitation Services Pager [phone] Office [phone]     [name] 10/25/2020, 5:59 PM

## 2020-10-26 DIAGNOSIS — I2510 Atherosclerotic heart disease of native coronary artery without angina pectoris: Secondary | ICD-10-CM | POA: Diagnosis not present

## 2020-10-26 DIAGNOSIS — M4802 Spinal stenosis, cervical region: Secondary | ICD-10-CM | POA: Diagnosis not present

## 2020-10-26 DIAGNOSIS — M4712 Other spondylosis with myelopathy, cervical region: Secondary | ICD-10-CM | POA: Diagnosis not present

## 2020-10-26 DIAGNOSIS — J4520 Mild intermittent asthma, uncomplicated: Secondary | ICD-10-CM | POA: Diagnosis not present

## 2020-10-26 LAB — BASIC METABOLIC PANEL
Anion gap: 8 (ref 5–15)
BUN: 13 mg/dL (ref 8–23)
CO2: 33 mmol/L — ABNORMAL HIGH (ref 22–32)
Calcium: 9.5 mg/dL (ref 8.9–10.3)
Chloride: 97 mmol/L — ABNORMAL LOW (ref 98–111)
Creatinine, Ser: 0.76 mg/dL (ref 0.44–1.00)
GFR, Estimated: >60 mL/min (ref >60)
Glucose, Bld: 151 mg/dL — ABNORMAL HIGH (ref 70–99)
Potassium: 4.3 mmol/L (ref 3.5–5.1)
Sodium: 138 mmol/L (ref 135–145)

## 2020-10-26 LAB — GLUCOSE, CAPILLARY
Glucose-Capillary: 149 mg/dL — ABNORMAL HIGH (ref 70–99)
Glucose-Capillary: 168 mg/dL — ABNORMAL HIGH (ref 70–99)
Glucose-Capillary: 241 mg/dL — ABNORMAL HIGH (ref 70–99)

## 2020-10-26 MED ORDER — METHOCARBAMOL 500 MG PO TABS: 500.0000 mg | ORAL_TABLET | Freq: Four times a day (QID) | ORAL | 0 refills | Status: DC | PRN

## 2020-10-26 MED ORDER — OXYCODONE HCL 10 MG PO TABS: 10.0000 mg | ORAL_TABLET | Freq: Three times a day (TID) | ORAL | 0 refills | Status: DC | PRN

## 2020-10-26 MED ORDER — DOCUSATE SODIUM 100 MG PO CAPS: 100.0000 mg | ORAL_CAPSULE | Freq: Two times a day (BID) | ORAL | 0 refills | Status: DC | PRN

## 2020-10-26 MED ORDER — OXYCODONE HCL ER 10 MG PO T12A: 10.0000 mg | EXTENDED_RELEASE_TABLET | Freq: Two times a day (BID) | ORAL | 0 refills | Status: DC

## 2020-10-26 MED ORDER — POLYETHYLENE GLYCOL 3350 17 G PO PACK: 17.0000 g | PACK | Freq: Every day | ORAL | 0 refills | Status: DC | PRN

## 2020-10-26 NOTE — Progress Notes (Signed)
Physical Therapy Treatment Patient Details Name: [name] MRN: [id number] DOB: 07/20/1955 Today's Date: 10/26/2020    History of Present Illness Pt is a 65 y/o female who presents with multiple falls at home. She is now s/p C5-C7 ACDF on 10/20/2020. 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    PT Comments    Pt seated in recliner able to progress gt and perform stair training.  Pt reports she has support from her daughter at home.  Will update recommendations to HHPT based on patient progress and excellent support at home.  Issued gt belt for home use to use of stair negotiation.  Will inform supervising PT of need for change in recommendations.      Follow Up Recommendations  Home health PT;Supervision for mobility/OOB     Equipment Recommendations  None recommended by PT    Recommendations for Other Services Rehab consult     Precautions / Restrictions Precautions Precautions: Cervical;Fall Precaution Booklet Issued: Yes (comment) Precaution Comments: recalled 2/3 able to recall 3rd precautions with cueing Required Braces or Orthoses: Cervical Brace Cervical Brace: Hard collar;At all times Restrictions Weight Bearing Restrictions: No Other Position/Activity Restrictions: lifting restrictions <5 pounds    Mobility  Bed Mobility               General bed mobility comments: Pt seated in recliner on arrival this session.    Transfers Overall transfer level: Needs assistance Equipment used: Rolling walker (2 wheeled) Transfers: Sit to/from Stand Sit to Stand: Supervision         General transfer comment: Cues for hand placement and safety.  Increased time to complete.  Ambulation/Gait Ambulation/Gait assistance: Min guard Gait Distance (Feet): 220 Feet Assistive device: Rolling walker (2 wheeled) Gait  Pattern/deviations: Step-through pattern;Decreased stride length;Narrow base of support Gait velocity: dec   General Gait Details: Mild unsteadiness but no buckling or overt LOB.  Cues for posture and RW safety.   Stairs Stairs: Yes Stairs assistance: Min assist Stair Management: One rail Left;Forwards;Backwards;Step to pattern Number of Stairs: 3 General stair comments: forward to ascend and backwards to descend.   Wheelchair Mobility    Modified Rankin (Stroke Patients Only)       Balance Overall balance assessment: Needs assistance Sitting-balance support: No upper extremity supported;Feet supported Sitting balance-Leahy Scale: Fair       Standing balance-Leahy Scale: Poor                              Cognition Arousal/Alertness: Awake/alert Behavior During Therapy: WFL for tasks assessed/performed                                   General Comments: difficulty with recall of precautions      Exercises      General Comments        Pertinent Vitals/Pain Pain Assessment: Faces Pain Score: 2  Pain Location: soreness throughout Pain Descriptors / Indicators: Discomfort Pain Intervention(s): Monitored during session;Repositioned    Home Living                      Prior Function            PT Goals (current goals can now be found in the care plan section) Acute  Rehab PT Goals Patient Stated Goal: Be able to go home Potential to Achieve Goals: Good Progress towards PT goals: Progressing toward goals    Frequency    Min 5X/week      PT Plan Frequency needs to be updated    Co-evaluation              AM-PAC PT "6 Clicks" Mobility   Outcome Measure  Help needed turning from your back to your side while in a flat bed without using bedrails?: A Little Help needed moving from lying on your back to sitting on the side of a flat bed without using bedrails?: A Little Help needed moving to and from a bed to  a chair (including a wheelchair)?: A Little Help needed standing up from a chair using your arms (e.g., wheelchair or bedside chair)?: A Little Help needed to walk in hospital room?: A Little Help needed climbing 3-5 steps with a railing? : A Little 6 Click Score: 18    End of Session Equipment Utilized During Treatment: Gait belt Activity Tolerance: Patient tolerated treatment well Patient left: in chair;with call bell/phone within reach;with chair alarm set Nurse Communication: Mobility status PT Visit Diagnosis: Other abnormalities of gait and mobility (R26.89);Muscle weakness (generalized) (M62.81)     Time: 4098-1191 PT Time Calculation (min) (ACUTE ONLY): 13 min  Charges:  $Gait Training: 8-22 mins                     Bonney Leitz , PTA Acute Rehabilitation Services Pager [phone] Office [phone]     Cruz Bong Artis Delay 10/26/2020, 2:23 PM

## 2020-10-26 NOTE — TOC Transition Note (Signed)
Transition of Care [facility]) - CM/SW Discharge Note   Patient Details  Name: [name] MRN: [id number] Date of Birth: 1955-10-29  Transition of Care [facility]) CM/SW Contact:  Milinda Antis, [place] Phone Number: 10/26/2020, 12:49 PM   Clinical Narrative:     Patient will DC to: home with home health Anticipated DC date:  10/26/2020 Family notified: Yes Transport by: Daughter Tammy   Per MD patient ready for DC home with [facility]..  CSW met with the patient at bedside to present bed offers for SNF as the plan yesterday was rehab at a SNF.  The patient was alert, oriented x4, pleasant, and engaged with CSW during the conversation.  The patient stated that she wanted to go home.  CSW inquired about supports and the patient reported that she has an adult daughter, grandchildren, and a friend who is coming from [place] to assist.  The patient reports having 24 hour care with these individuals in the home with her.  The patient report that she has done well with PT/OT, "knows her limits", and will accept the possible risk associated with going home.  The patient was open to home health services.  The attending and surgeon were notified.  Home health services were arranged with [facility].  The patient reported having all medical equipment (rolling walker and bsc) and not needing any from the hospital.  The patient will be transported home by her daughter Tammie.    CSW spoke with the daughter and informed of the d/c home today.   CSW will sign off for now as social work intervention is no longer needed. Please consult [place] again if new needs arise.    Final next level of care: [place] Barriers to Discharge: Barriers Resolved   Patient Goals and CMS Choice Patient states their goals for this hospitalization and ongoing recovery are:: "To get back to walking without falling" CMS Medicare.gov Compare Post Acute Care list provided to:: Patient Choice offered to / list presented to :  Patient  Discharge Placement                  Name of family member notified: Ed Blalock (Daughter)   [phone] Patient and family notified of of transfer: 10/26/20  Discharge Plan and Services                          HH Arranged: PT,OT [place]: [place] Date [place]: 10/26/20 Time [place]: 9371 Representative spoke with at [place]: [place] ([place]) Interventions     Readmission Risk Interventions No flowsheet data found.

## 2020-10-26 NOTE — Discharge Summary (Signed)
Physician Discharge Summary   Patient ID: [name] MRN: [id number] DOB/AGE: 28-Jan-1956 65 y.o.  Admit date: 10/14/2020 Discharge date: 10/26/2020  Primary Care Physician:  Simona Huh, NP   Recommendations for Outpatient Follow-up:  1. 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, C6-C7 Status post cervical discectomy, C6 corpectomy, C5 7 plate allograft Chronic low back pain Frequent PVCs, asymptomatic History of CAD Diabetes mellitus type 2, IDDM, poorly controlled Peripheral neuropathy History of asthma Hyperlipidemia  Consults: Orthopedics    Allergies:  No Known Allergies   DISCHARGE MEDICATIONS: Allergies as of 10/26/2020   No Known Allergies     Medication List    STOP taking these medications   buprenorphine 10 MCG/HR Ptwk Commonly known as: BUTRANS   glipiZIDE 5 MG tablet Commonly known as: GLUCOTROL   oxyCODONE-acetaminophen 10-325 MG tablet Commonly known as: PERCOCET   tiZANidine 4 MG capsule Commonly known as: ZANAFLEX     TAKE these medications   acetaminophen 325 MG tablet Commonly known as: TYLENOL Take 1-2 tablets (325-650 mg total) by mouth every 6 (six) hours as needed for mild pain (pain score 1-3 or temp > 100.5).   ADVANCED DIABETIC MULTIVITAMIN PO Take 1 tablet by mouth daily.   albuterol (2.5 MG/3ML) 0.083% nebulizer solution Commonly known as: PROVENTIL Take 2.5 mg by nebulization every 6 (six) hours as needed for wheezing or shortness of breath.   albuterol 108 (90 Base) MCG/ACT inhaler Commonly known as: VENTOLIN HFA Inhale 2 puffs into the lungs every 4 (four) hours as needed for wheezing or shortness of breath. For shortness of breath   amitriptyline 10 MG tablet Commonly known as:  ELAVIL Take 10 mg by mouth at bedtime.   ARIPiprazole 5 MG tablet Commonly known as: ABILIFY Take 5 mg by mouth daily.   aspirin EC 81 MG tablet Take 1 tablet (81 mg total) by mouth daily.   blood glucose meter kit and supplies Dispense based on patient and insurance preference. Use up to four times daily as directed. (FOR ICD-10 E10.9, E11.9).   docusate sodium 100 MG capsule Commonly known as: COLACE Take 1 capsule (100 mg total) by mouth 2 (two) times daily as needed for mild constipation.   etodolac 600 MG 24 hr tablet Commonly known as: LODINE XL Take 600 mg by mouth daily as needed.   fenofibrate 54 MG tablet Take 54 mg by mouth daily.   gabapentin 800 MG tablet Commonly known as: NEURONTIN Take 800 mg by mouth 3 (three) times daily.   Janumet 50-1000 MG tablet Generic drug: sitaGLIPtin-metformin Take 1 tablet by mouth 2 (two) times daily.   Levemir FlexTouch 100 UNIT/ML FlexPen Generic drug: insulin detemir Inject 24 Units into the skin at bedtime.   Magnesium 500 MG Tabs Take 500 mg by mouth at bedtime.   metaxalone 800 MG tablet Commonly known as: SKELAXIN Take 800 mg by mouth 4 (four) times daily as needed.   methocarbamol 500 MG tablet Commonly known as: ROBAXIN Take 1 tablet (500 mg total) by mouth every 6 (six) hours as needed for muscle spasms.   Oxycodone HCl 10 MG Tabs Take 1 tablet (10 mg total) by mouth every 8 (eight) hours as needed for moderate pain or severe pain. What changed: You were already taking  a medication with the same name, and this prescription was added. Make sure you understand how and when to take each.   oxyCODONE 10 mg 12 hr tablet Commonly known as: OXYCONTIN Take 1 tablet (10 mg total) by mouth every 12 (twelve) hours. What changed: Another medication with the same name was added. Make sure you understand how and when to take each.   Ozempic (1 MG/DOSE) 4 MG/3ML Sopn Generic drug: Semaglutide (1 MG/DOSE) Inject 1 mg  into the muscle every Saturday.   polyethylene glycol 17 g packet Commonly known as: MIRALAX / GLYCOLAX Take 17 g by mouth daily as needed for mild constipation.   pravastatin 80 MG tablet Commonly known as: PRAVACHOL Take 80 mg by mouth daily.   sertraline 100 MG tablet Commonly known as: ZOLOFT Take 100 mg by mouth at bedtime.   vitamin B-12 1000 MCG tablet Commonly known as: CYANOCOBALAMIN Take 1,000 mcg by mouth daily.   zolpidem 5 MG tablet Commonly known as: AMBIEN Take 5 mg by mouth at bedtime as needed for sleep.            Durable Medical Equipment  (From admission, onward)         Start     Ordered   10/26/20 1205  For home use only DME 3 n 1  Once        10/26/20 1205           Discharge Care Instructions  (From admission, onward)         Start     Ordered   10/26/20 0000  If the dressing is still on your incision site when you go home, remove it on the third day after your surgery date. Remove dressing if it begins to fall off, or if it is dirty or damaged before the third day.        10/26/20 1119           Brief H and P: For complete details please refer to admission H and P, but in brief  Patient is a 65 year old woman with diabetes, CAD, OA, comes into the hospital with multiple falls at home, right leg weakness.  Patient also reported decreased sensation in her hands. Lumbar spine MRI showed multilevel lumbar spondylosis L1-2 and L4-5, moderate canal stenosis and severe bilateral foraminal stenosis L4-5 L5-S1. C-spine MRI was done due to hand involvement which showed severe spinal stenosis and cord compression at C5-6 level possibly C6-7. Orthopedic surgery was consulted Patient was transferred from [facility] to [facility] Course:    Spinal stenosis, cervical region, cord compression at C5-C6 C6-C7 -Orthopedic surgery was consulted -Underwent C5-C6, C6-C7 anterior cervical discectomy, C6 corpectomy, C5 -7 plate allograft  on 6/38 (Dr. Louanne Skye) -Per patient, overall strength now improving postoperatively. -Denied by insurance for [employer] - patient continue to work with PT OT, declined skilled nursing facility.  She is safe to be discharged home with home health which was arranged by [facility]   Chronic low back pain -Moderate canal stenosis and severe bilateral foraminal stenosis L4-5, L5-S1 -Continue pain control, conservative management  Frequent PVCs, with prior history of PVCs, bradycardia -Asymptomatic.  Follow BMET -Echo showed normal EF, no WMA, grade 1 diastolic dysfunction -Recommended outpatient cardiology followup  CAD -Currently no acute complaints.  No recent cardiac symptoms.  Diabetes mellitus type 2, insulin-dependent, poorly controlled with hyperglycemia and peripheral neuropathy -Continue Levemir 24 units at bedtime, Janumet.   -Hemoglobin A1c 8.2 on 5/22 Outpatient  follow-up with PCP   Day of Discharge S: Doing much better, states bilateral upper and lower extremity strength has been improving since her surgery and PT OT.  Wants to go home.  Was denied by insurance for CIR.  BP (!) 102/46 (BP Location: Right Arm)   Pulse (!) 44   Temp 98.5 F (36.9 C)   Resp 16   Ht '5\' 3"'  (1.6 m)   Wt 74.4 kg   SpO2 99%   BMI 29.05 kg/m   Physical Exam: General: Alert and awake oriented x3 not in any acute distress. CVS: S1-S2 clear no murmur rubs or gallops Chest: clear to auscultation bilaterally, no wheezing rales or rhonchi Abdomen: soft nontender, nondistended, normal bowel sounds Extremities: no cyanosis, clubbing or edema noted bilaterally Neuro: no new deficits    Get Medicines reviewed and adjusted: Please take all your medications with you for your next visit with your Primary MD  Please request your Primary MD to go over all hospital tests and procedure/radiological results at the follow up. Please ask your Primary MD to get all Hospital records sent to his/her office.  If you  experience worsening of your admission symptoms, develop shortness of breath, life threatening emergency, suicidal or homicidal thoughts you must seek medical attention immediately by calling 911 or calling your MD immediately  if symptoms less severe.  You must read complete instructions/literature along with all the possible adverse reactions/side effects for all the Medicines you take and that have been prescribed to you. Take any new Medicines after you have completely understood and accept all the possible adverse reactions/side effects.   Do not drive when taking pain medications.   Do not take more than prescribed Pain, Sleep and Anxiety Medications  Special Instructions: If you have smoked or chewed Tobacco  in the last 2 yrs please stop smoking, stop any regular Alcohol  and or any Recreational drug use.  Wear Seat belts while driving.  Please note  You were cared for by a hospitalist during your hospital stay. Once you are discharged, your primary care physician will handle any further medical issues. Please note that NO REFILLS for any discharge medications will be authorized once you are discharged, as it is imperative that you return to your primary care physician (or establish a relationship with a primary care physician if you do not have one) for your aftercare needs so that they can reassess your need for medications and monitor your lab values.   The results of significant diagnostics from this hospitalization (including imaging, microbiology, ancillary and laboratory) are listed below for reference.      Procedures/Studies:  DG Chest 2 View  Result Date: 10/14/2020 CLINICAL DATA:  Fall EXAM: CHEST - 2 VIEW COMPARISON:  09/26/2016 FINDINGS: Mild hyperinflation. Heart and mediastinal contours are within normal limits. No focal opacities or effusions. No acute bony abnormality. IMPRESSION: Mild hyperinflation.  No active cardiopulmonary disease. Electronically Signed   By:  Rolm Baptise M.D.   On: 10/14/2020 16:37   DG Cervical Spine 2 or 3 views  Result Date: 10/20/2020 CLINICAL DATA:  Status post cervical fusion. C5-C7 fusion C6 central corpectomy with strut graft C5 through C7. EXAM: CERVICAL SPINE - 2-3 VIEW COMPARISON:  Preoperative radiograph 10/16/2020 FINDINGS: Anterior C5 through C7 fusion with C6 corpectomy. Straightening of normal lordosis without listhesis. Drain in place anteriorly. Stable upper cervical spine. No apical pneumothorax. IMPRESSION: Anterior C5 through C7 fusion with C6 corpectomy. No immediate postoperative complication. Electronically Signed  By: Keith Rake M.D.   On: 10/20/2020 19:53   DG Cervical Spine 2 or 3 views  Result Date: 10/20/2020 CLINICAL DATA:  Surgery, elective. Additional history provided: C5-C6, C6-7 anterior cervical discectomy, C6 corpectomy, C5-C7 plate, allograft. Provided fluoroscopy time 23 seconds (7.51 mGy). EXAM: CERVICAL SPINE - 2-3 VIEW; DG C-ARM 1-60 MIN COMPARISON:  Cervical spine MRI 10/16/2020. FINDINGS: PA and lateral view intraoperative fluoroscopic images of the cervical spine are submitted, 4 images total. On the provided images, ACDF hardware spans the C5-C7 levels (ventral plate with screws at C5 and C7). ET tube present with tip terminating at the level of the clavicular heads. IMPRESSION: Four intraoperative fluoroscopic images of the cervical spine, as described. Correlate with the procedural history. Electronically Signed   By: Kellie Simmering DO   On: 10/20/2020 18:27   DG Cervical Spine Complete  Result Date: 10/16/2020 CLINICAL DATA:  65 year old female with bilateral hand numbness. EXAM: CERVICAL SPINE - COMPLETE 4+ VIEW COMPARISON:  None. FINDINGS: There is no acute fracture or subluxation of the cervical spine. There is mild straightening of normal cervical lordosis which may be positional or due to muscle spasm. Multilevel degenerative changes with disc space narrowing, endplate irregularity  and anterior osteophyte most prominent at C5-C6. The visualized posterior elements and odontoid appear intact. There is anatomic alignment of the lateral masses of C1 and C2. The soft tissues are unremarkable. IMPRESSION: 1. No acute/traumatic cervical spine pathology. 2. Multilevel degenerative changes. Electronically Signed   By: Anner Crete M.D.   On: 10/16/2020 18:15   DG Thoracic Spine 2 View  Result Date: 10/14/2020 CLINICAL DATA:  Fall, pain EXAM: THORACIC SPINE 2 VIEWS COMPARISON:  None. FINDINGS: Degenerative changes throughout the thoracic spine with disc space narrowing and spurring. Normal alignment. No fracture or focal bone lesion. IMPRESSION: Degenerative changes.  No acute bony abnormality Electronically Signed   By: Rolm Baptise M.D.   On: 10/14/2020 16:36   DG Lumbar Spine Complete  Result Date: 10/14/2020 CLINICAL DATA:  Fall, pain EXAM: LUMBAR SPINE - COMPLETE 4+ VIEW COMPARISON:  None. FINDINGS: Diffuse degenerative disc and facet disease. 4 mm anterolisthesis of L5 on S1 related to degenerative facet disease. No fracture. SI joints symmetric and unremarkable. IMPRESSION: Diffuse degenerative disc and facet disease. No acute bony abnormality. Electronically Signed   By: Rolm Baptise M.D.   On: 10/14/2020 16:36   MR LUMBAR SPINE WO CONTRAST  Result Date: 10/14/2020 CLINICAL DATA:  Chronic low back pain with bilateral radiculopathy EXAM: MRI LUMBAR SPINE WITHOUT CONTRAST TECHNIQUE: Multiplanar, multisequence MR imaging of the lumbar spine was performed. No intravenous contrast was administered. COMPARISON:  MRI 10/01/2017 FINDINGS: Segmentation:  Standard. Alignment: 5 mm grade 1 anterolisthesis L5 on S1. Trace retrolisthesis of T12 on L1 and L1 on L2. Vertebrae: No fracture, evidence of discitis, or bone lesion. Discogenic endplate marrow changes at L4-5. Conus medullaris and cauda equina: Conus extends to the L2 level. Conus and cauda equina appear normal. Paraspinal and other  soft tissues: No acute abnormality. Disc levels: T12-L1: Mild circumferential disc bulge and endplate spurring, most pronounced within the left foraminal zone. No canal stenosis. Mild left foraminal stenosis. Unchanged. L1-L2: Progressive disc height loss with diffuse disc osteophyte complex. Mild bilateral facet arthropathy. Mild canal stenosis with mild bilateral foraminal stenosis. Findings progressed from prior. L2-L3: Mild disc bulge, slightly eccentric to the right. Mild bilateral facet hypertrophy. Mild right foraminal stenosis. No canal stenosis. Unchanged. L3-L4: No disc protrusion. Mild bilateral facet  hypertrophy. No foraminal or canal stenosis. Unchanged. L4-L5: Progressive disc height loss with circumferential disc bulge and endplate spurring. Bilateral facet arthropathy and ligamentum flavum buckling. Findings contribute to moderate canal stenosis with severe bilateral foraminal stenosis. Findings progressed from prior. L5-S1: Anterolisthesis with disc uncovering and diffuse disc bulge. Bilateral facet arthropathy. Severe bilateral foraminal stenosis. No canal stenosis. Unchanged. IMPRESSION: 1. Multilevel lumbar spondylosis, as above. Findings have progressed at the L1-2 and L4-5 levels. 2. Moderate canal stenosis and severe bilateral foraminal stenosis at L4-5. 3. Severe bilateral foraminal stenosis at L5-S1. Electronically Signed   By: Davina Poke D.O.   On: 10/14/2020 19:27   MR CERVICAL SPINE W WO CONTRAST  Result Date: 10/17/2020 CLINICAL DATA:  Initial evaluation for progressive myelopathy, gait abnormality, weakness. History of fall. EXAM: MRI CERVICAL SPINE WITHOUT AND WITH CONTRAST TECHNIQUE: Multiplanar and multiecho pulse sequences of the cervical spine, to include the craniocervical junction and cervicothoracic junction, were obtained without and with intravenous contrast. CONTRAST:  34m GADAVIST GADOBUTROL 1 MMOL/ML IV SOLN COMPARISON:  Prior radiograph from earlier the same day.  FINDINGS: Alignment: Straightening of the normal cervical lordosis. No listhesis or malalignment. Vertebrae: Vertebral body height maintained without acute or chronic fracture. T2 and partially visualized T3 vertebral bodies are somewhat small and partially fused, likely congenital in nature. Underlying bone marrow signal intensity within normal limits. No discrete or worrisome osseous lesions. Discogenic reactive endplate change present about the C5-6 and C6-7 interspaces. No other abnormal marrow edema or enhancement. Cord: Suspected patchy cord signal abnormality at the level of C5-6, suggesting compressive myelopathy. Additional possible patchy signal abnormality at the level of C6-7 as well. Signal intensity within the cervical spinal cord is otherwise within normal limits. Posterior Fossa, vertebral arteries, paraspinal tissues: Visualized brain and posterior fossa within normal limits. Craniocervical junction normal. Paraspinous and prevertebral soft tissues within normal limits. Normal flow voids seen within the vertebral arteries bilaterally. Disc levels: C2-C3: Negative interspace. Bilateral facet hypertrophy. No canal or foraminal stenosis. C3-C4: Degenerative intervertebral disc space narrowing with diffuse disc bulge and bilateral uncovertebral spurring. Left greater than right facet degeneration. No spinal stenosis. Severe left with moderate right C4 foraminal narrowing. C4-C5: Mild disc bulge with uncovertebral and endplate spurring. Posterior disc osteophyte flattens and partially effaces the ventral thecal sac with no more than mild spinal stenosis. No cord impingement. Superimposed left-sided facet degeneration. Severe bilateral C5 foraminal stenosis. C5-C6: Degenerative intervertebral disc space narrowing with diffuse disc osteophyte complex. Broad posterior component flattens and effaces the ventral thecal sac. Secondary severe spinal stenosis with compression of the cervical spinal cord and  probable cord signal changes. Thecal sac narrowed to approximately 2 mm in AP diameter at its most narrow point (series 7, image 25). Severe bilateral C6 foraminal narrowing. C6-C7: Degenerative intervertebral disc space narrowing with diffuse disc osteophyte complex. Broad posterior component flattens and effaces the ventral thecal sac, asymmetric to the right. Resultant severe spinal stenosis with cord flattening. Possible subtle patchy cord signal changes at this level, not entirely certain on this motion degraded exam. Thecal sac measures 5 mm in AP diameter at its most narrow point. Severe right worse than left C7 foraminal stenosis. C7-T1: Degenerative intervertebral disc space narrowing with mild disc bulge and uncovertebral spurring. No significant spinal stenosis. Foramina appear patent. Visualized upper thoracic spine demonstrates no other significant finding. IMPRESSION: 1. 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 Suspected patchy cord signal abnormality  at C5-6 and possibly C6-7, suggesting myelopathy. 2. No other acute abnormality within the cervical spine. 3. Additional multifactorial degenerative changes with resultant severe bilateral C4 through C7 foraminal stenosis as above. Findings communicated to the covering physician for this patient Dr. Tonie Griffith at 4:30 a.m. on 10/17/2020. Electronically Signed   By: Jeannine Boga M.D.   On: 10/17/2020 04:45   DG C-Arm 1-60 Min  Result Date: 10/20/2020 CLINICAL DATA:  Surgery, elective. Additional history provided: C5-C6, C6-7 anterior cervical discectomy, C6 corpectomy, C5-C7 plate, allograft. Provided fluoroscopy time 23 seconds (7.51 mGy). EXAM: CERVICAL SPINE - 2-3 VIEW; DG C-ARM 1-60 MIN COMPARISON:  Cervical spine MRI 10/16/2020. FINDINGS: PA and lateral view intraoperative fluoroscopic images of the cervical spine are submitted, 4 images total. On the  provided images, ACDF hardware spans the C5-C7 levels (ventral plate with screws at C5 and C7). ET tube present with tip terminating at the level of the clavicular heads. IMPRESSION: Four intraoperative fluoroscopic images of the cervical spine, as described. Correlate with the procedural history. Electronically Signed   By: Kellie Simmering DO   On: 10/20/2020 18:27   ECHOCARDIOGRAM COMPLETE  Result Date: 10/23/2020    ECHOCARDIOGRAM REPORT   Patient Name:   [name] Date of Exam: 10/23/2020 Medical Rec #:  [id number]    Height:       63.0 in Accession #:    [id number]   Weight:       164.0 lb Date of Birth:  May 08, 1956    BSA:          1.777 m Patient Age:    48 years     BP:           115/54 mmHg Patient Gender: F            HR:           100 bpm. Exam Location:  Inpatient Procedure: 2D Echo, Cardiac Doppler and Color Doppler Indications:    R94.31 Abnormal EKG  History:        Patient has no prior history of Echocardiogram examinations.  Sonographer:    Merrie Roof RDCS Referring Phys: [place]  1. Left ventricular ejection fraction, by estimation, is 60 to 65%. The left ventricle has normal function. The left ventricle has no regional wall motion abnormalities. Left ventricular diastolic parameters are consistent with Grade I diastolic dysfunction (impaired relaxation).  2. Right ventricular systolic function is normal. The right ventricular size is normal.  3. The mitral valve is normal in structure. Mild mitral valve regurgitation. No evidence of mitral stenosis.  4. The aortic valve is tricuspid. Aortic valve regurgitation is not visualized. Mild aortic valve sclerosis is present, with no evidence of aortic valve stenosis.  5. The inferior vena cava is normal in size with greater than 50% respiratory variability, suggesting right atrial pressure of 3 mmHg. FINDINGS  Left Ventricle: Left ventricular ejection fraction, by estimation, is 60 to 65%. The left ventricle has normal  function. The left ventricle has no regional wall motion abnormalities. The left ventricular internal cavity size was normal in size. There is  no left ventricular hypertrophy. Left ventricular diastolic parameters are consistent with Grade I diastolic dysfunction (impaired relaxation). Right Ventricle: The right ventricular size is normal. Right ventricular systolic function is normal. Left Atrium: Left atrial size was normal in size. Right Atrium: Right atrial size was normal in size. Pericardium: There is no evidence of pericardial effusion. Mitral Valve: The mitral valve  is normal in structure. Mild mitral valve regurgitation. No evidence of mitral valve stenosis. Tricuspid Valve: The tricuspid valve is normal in structure. Tricuspid valve regurgitation is trivial. No evidence of tricuspid stenosis. Aortic Valve: The aortic valve is tricuspid. Aortic valve regurgitation is not visualized. Mild aortic valve sclerosis is present, with no evidence of aortic valve stenosis. Aortic valve mean gradient measures 8.0 mmHg. Aortic valve peak gradient measures 17.5 mmHg. Aortic valve area, by VTI measures 1.60 cm. Pulmonic Valve: The pulmonic valve was not well visualized. Pulmonic valve regurgitation is not visualized. No evidence of pulmonic stenosis. Aorta: The aortic root is normal in size and structure. Venous: The inferior vena cava is normal in size with greater than 50% respiratory variability, suggesting right atrial pressure of 3 mmHg. IAS/Shunts: No atrial level shunt detected by color flow Doppler.  LEFT VENTRICLE PLAX 2D LVIDd:         4.45 cm  Diastology LVIDs:         3.10 cm  LV e' medial:    6.31 cm/s LV PW:         1.05 cm  LV E/e' medial:  11.5 LV IVS:        1.00 cm  LV e' lateral:   7.29 cm/s LVOT diam:     2.00 cm  LV E/e' lateral: 10.0 LV SV:         49 LV SV Index:   28 LVOT Area:     3.14 cm  RIGHT VENTRICLE RV Basal diam:  3.50 cm LEFT ATRIUM             Index       RIGHT ATRIUM           Index  LA diam:        3.80 cm 2.14 cm/m  RA Area:     19.70 cm LA Vol (A2C):   50.6 ml 28.47 ml/m RA Volume:   56.30 ml  31.68 ml/m LA Vol (A4C):   60.7 ml 34.15 ml/m LA Biplane Vol: 57.6 ml 32.41 ml/m  AORTIC VALVE AV Area (Vmax):    1.47 cm AV Area (Vmean):   1.57 cm AV Area (VTI):     1.60 cm AV Vmax:           209.00 cm/s AV Vmean:          130.000 cm/s AV VTI:            0.306 m AV Peak Grad:      17.5 mmHg AV Mean Grad:      8.0 mmHg LVOT Vmax:         97.90 cm/s LVOT Vmean:        65.000 cm/s LVOT VTI:          0.156 m LVOT/AV VTI ratio: 0.51  AORTA Ao Root diam: 3.15 cm MITRAL VALVE MV Area (PHT): 3.51 cm    SHUNTS MV Decel Time: 216 msec    Systemic VTI:  0.16 m MV E velocity: 72.80 cm/s  Systemic Diam: 2.00 cm MV A velocity: 58.80 cm/s MV E/A ratio:  1.24 Kirk Ruths MD Electronically signed by Kirk Ruths MD Signature Date/Time: 10/23/2020/6:05:27 PM    Final        LAB RESULTS: Basic Metabolic Panel: Recent Labs  Lab 10/25/20 0633 10/26/20 0342  NA 138 138  K 3.9 4.3  CL 97* 97*  CO2 32 33*  GLUCOSE 142* 151*  BUN 16 13  CREATININE 0.71  0.76  CALCIUM 9.5 9.5  MG 1.7  --    Liver Function Tests: No results for input(s): AST, ALT, ALKPHOS, BILITOT, PROT, ALBUMIN in the last 168 hours. No results for input(s): LIPASE, AMYLASE in the last 168 hours. No results for input(s): AMMONIA in the last 168 hours. CBC: Recent Labs  Lab 10/24/20 0152 10/25/20 0633  WBC 11.2* 10.0  HGB 10.1* 10.2*  HCT 32.2* 33.4*  MCV 86.3 87.7  PLT 272 304   Cardiac Enzymes: No results for input(s): CKTOTAL, CKMB, CKMBINDEX, TROPONINI in the last 168 hours. BNP: Invalid input(s): POCBNP CBG: Recent Labs  Lab 10/26/20 0726 10/26/20 1141  GLUCAP 149* 241*       Disposition and Follow-up: Discharge Instructions    Diet - low sodium heart healthy   Complete by: As directed    If the dressing is still on your incision site when you go home, remove it on the third day after  your surgery date. Remove dressing if it begins to fall off, or if it is dirty or damaged before the third day.   Complete by: As directed    Increase activity slowly   Complete by: As directed        DISPOSITION: *Home with home health   [place]    Jessy Oto, MD In 2 weeks.   Specialty: Orthopedic Surgery Why: For wound re-check Contact information: [place] [address] [phone]        Simona Huh, NP. Schedule an appointment as soon as possible for a visit in 2 week(s).   Specialty: Nurse Practitioner Contact information: [place] [address] [phone]                Time coordinating discharge:  35 minutes  Signed:   Estill Cotta M.D. [facility] 10/26/2020, 12:07 PM

## 2020-10-27 ENCOUNTER — Ambulatory Visit: Payer: Medicare Other | Admitting: Orthopedic Surgery

## 2020-10-27 NOTE — Progress Notes (Signed)
   10/26/20 0717  Assess: MEWS Score  Temp 98 F (36.7 C)  BP 100/60  Pulse Rate (!) 42  Resp 18  SpO2 96 %  O2 Device Room Air  Assess: MEWS Score  MEWS Temp 0  MEWS Systolic 1  MEWS Pulse 1  MEWS RR 0  MEWS LOC 0  MEWS Score 2  MEWS Score Color Yellow  Assess: if the MEWS score is Yellow or Red  Were vital signs taken at a resting state? Yes  Focused Assessment No change from prior assessment  Early Detection of Sepsis Score *See Row Information* Low  MEWS guidelines implemented *See Row Information* Yes  Treat  MEWS Interventions Other (Comment)  Pain Scale 0-10  Pain Score 0  Take Vital Signs  Increase Vital Sign Frequency  Yellow: Q 2hr X 2 then Q 4hr X 2, if remains yellow, continue Q 4hrs  Escalate  MEWS: Escalate Yellow: discuss with charge nurse/RN and consider discussing with provider and RRT  Notify: Charge Nurse/RN  Name of Charge Nurse/RN Notified lauren  Date Charge Nurse/RN Notified 10/26/20  Time Charge Nurse/RN Notified 0717  Assess: SIRS CRITERIA  SIRS Temperature  0  SIRS Pulse 0  SIRS Respirations  0  SIRS WBC 0  SIRS Score Sum  0

## 2020-11-03 ENCOUNTER — Encounter: Payer: Medicare Other | Admitting: Surgery

## 2020-11-03 ENCOUNTER — Telehealth: Payer: Self-pay

## 2020-11-03 NOTE — Telephone Encounter (Signed)
Patient called she had a appointment scheduled for this afternoon, patient had to cancel due to not having transportation patient is requesting to be worked into either [employer] or [employer] schedule if possible call [phone]

## 2020-11-03 NOTE — Telephone Encounter (Signed)
Holding for [employer]. Spoke with Fayrene Fearing who states patient needs to see Dr. Otelia Sergeant because she had a corpectomy. Both James and Dr. Otelia Sergeant at 100% next week.

## 2020-11-06 ENCOUNTER — Telehealth: Payer: Self-pay

## 2020-11-06 NOTE — Telephone Encounter (Signed)
Pt called asking for an appt regarding surgery follow up. I gave pt an appt for the 22nd with james but she is requesting to be seen this week due to her not having a ride next week. Please advise

## 2020-11-06 NOTE — Telephone Encounter (Signed)
Worked pt in on Thursday 11/09/20 @ 3pm

## 2020-11-06 NOTE — Telephone Encounter (Signed)
Worked pt in on Thursday 11/09/20 @ 3pm 

## 2020-11-08 ENCOUNTER — Telehealth: Payer: Self-pay | Admitting: Specialist

## 2020-11-08 NOTE — Telephone Encounter (Signed)
[facility] Physical Therapist calling on behalf of the pt asking to receive verbal orders to continue therapy with the pt. The best call back number is [phone].

## 2020-11-08 NOTE — Telephone Encounter (Signed)
I called and spoke with [name] and she states that they are needing orders for gait training, streghtening, 1wk x 1, and she will send over orders for Dr. Otelia Sergeant to sign of on.

## 2020-11-09 ENCOUNTER — Ambulatory Visit (INDEPENDENT_AMBULATORY_CARE_PROVIDER_SITE_OTHER): Payer: Medicare Other | Admitting: Specialist

## 2020-11-09 ENCOUNTER — Ambulatory Visit: Payer: Self-pay

## 2020-11-09 ENCOUNTER — Telehealth: Payer: Self-pay

## 2020-11-09 ENCOUNTER — Encounter: Payer: Self-pay | Admitting: Specialist

## 2020-11-09 VITALS — BP 125/85 | HR 102 | Ht 63.0 in | Wt 164.0 lb

## 2020-11-09 DIAGNOSIS — Z981 Arthrodesis status: Secondary | ICD-10-CM

## 2020-11-09 MED ORDER — OXYCODONE HCL 7.5 MG PO TABS: 1.0000 | ORAL_TABLET | Freq: Four times a day (QID) | ORAL | 0 refills | Status: DC | PRN

## 2020-11-09 MED ORDER — CYCLOBENZAPRINE HCL 5 MG PO TABS: 5.0000 mg | ORAL_TABLET | Freq: Three times a day (TID) | ORAL | 0 refills | Status: DC | PRN

## 2020-11-09 NOTE — Telephone Encounter (Signed)
[name] with [facility] called stating that they do not carry Oxycodone 7.5mg  nor is it covered.  Would like to know if Rx can be changed to 5 or 10mg .  Please advise.  Cb# [phone].  Thank you.

## 2020-11-09 NOTE — Patient Instructions (Signed)
° ° °  No lifting greater than 10 lbs. No overhead use of arms. °Avoid bending,and twisting neck. °Walk in house for first week them may start to get out slowly increasing distance up to one quarter mile by 3 weeks post op. °Keep incision dry for 3 days, may then bathe and wet incision using a [place] collar when showering. °Call if any fevers >101, chills, or increasing numbness or weakness or increased swelling or drainage. ° °

## 2020-11-09 NOTE — Progress Notes (Signed)
Post-Op Visit Note   Patient: [name]           Date of Birth: 10-May-1956           MRN: [id number] Visit Date: 11/09/2020 PCP: Courtney Paris, NP      Assessment & Plan: 3 weeks post of C6 corpectomy and fusion C5 to C7  Chief Complaint:  Chief Complaint  Patient presents with   Neck - Routine Post Op    2wk 6 day post C5-, 6-7 ACDF, C6 Corpectomy, C5-7 Plate   Visit Diagnoses:  1. S/P cervical spinal fusion   Incision is healed. UE motor is normal with minimal C7 weakness right side.  Radiographs are showing hardware and graft in good position and alignment.   Plan: No lifting greater than 10 lbs. No overhead use of arms. Avoid bending,and twisting neck. Walk in house for first week them may start to get out slowly increasing distance up to one quarter mile by 3 weeks post op. Keep incision dry for 3 days, may then bathe and wet incision using a [place] collar when showering. Call if any fevers >101, chills, or increasing numbness or weakness or increased swelling or drainage.   Follow-Up Instructions: No follow-ups on file.   Orders:  Orders Placed This Encounter  Procedures   XR Cervical Spine 2 or 3 views   No orders of the defined types were placed in this encounter.   Imaging: No results found.  PMFS History: Patient Active Problem List   Diagnosis Date Noted   Other spondylosis with myelopathy, cervical region 10/20/2020    Priority: High    Class: Chronic   Cervical cord compression with myelopathy (HCC) 10/18/2020   Spinal stenosis, cervical region 10/18/2020   Polypharmacy 10/18/2020   CAD (coronary artery disease) 10/18/2020   Weakness 10/14/2020   Fall at home, initial encounter 10/14/2020   S/P total knee arthroplasty, 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   Nephrolithiasis 07/08/2017   Abnormal EKG 06/18/2017   Preop cardiovascular exam 06/18/2017   History of myocardial infarction 06/18/2017   Chronic joint pain 10/08/2016   Asthma 10/08/2016    History of kidney stones 10/08/2016   Insomnia 10/08/2016   Diabetes mellitus type 2, noninsulin dependent (HCC) 09/10/2013   Obesity (BMI 30-39.9) 09/10/2013   GERD 03/15/2009   Past Medical History:  Diagnosis Date   Anxiety    Arthritis    Asthma    Depression    Dysrhythmia    History of GI bleed    2010--- UPPER ESOPHAGITIS/ DUODERAL ULCER/ EROSION   History of hiatal hernia    History of kidney stones    History of shingles    2013   Myocardial infarction (HCC) 10 years ago    Nephrolithiasis    BILATERAL   Right ureteral stone    Type 2 diabetes, diet controlled (HCC)    Urgency of urination     Family History  Problem Relation Age of Onset   Heart disease Father     Past Surgical History:  Procedure Laterality Date   ANTERIOR CERVICAL DECOMP/DISCECTOMY FUSION N/A 10/20/2020   Procedure: C5-6, C6-7 ANTERIOR CERVICAL DISCECTOMY, C6 CORPECTOMY, C5-C7 PLATE, ALLOGRAFT;  Surgeon: Kerrin Champagne, MD;  Location: [facility];  Service: Orthopedics;  Laterality: N/A;   CARDIAC CATHETERIZATION  02-10-2009  DR BENSIMHON   NORMAL CORONARIES/  LOW NORMAL LVF WITHOUT WALL MOTION ABNORMALITIES   CARDIAC CATHETERIZATION  11-21-2010  DR Jacinto Halim   NORMAL CORONARIES/  EF 50-55%  CHOLECYSTECTOMY  1990s   back in [place]    CYSTOSCOPY W/ URETERAL STENT PLACEMENT Right 09/04/2013   Procedure: CYSTOSCOPY WITH RETROGRADE PYELOGRAM/URETERAL STENT PLACEMENT;  Surgeon: Milford Cage, MD;  Location: [facility];  Service: Urology;  Laterality: Right;   CYSTOSCOPY WITH RETROGRADE PYELOGRAM, URETEROSCOPY AND STENT PLACEMENT Right 09/27/2013   Procedure: CYSTOSCOPY WITH RETROGRADE PYELOGRAM, URETEROSCOPY AND STENT EXCHANGE;  Surgeon: Milford Cage, MD;  Location: [facility];  Service: Urology;  Laterality: Right;   HOLMIUM LASER APPLICATION Right 09/27/2013   Procedure: HOLMIUM LASER APPLICATION;  Surgeon: Milford Cage, MD;  Location: [facility];   Service: Urology;  Laterality: Right;   HYSTEROSCOPY WITH D & C  01-25-2003   IR URETERAL STENT RIGHT NEW ACCESS W/O SEP NEPHROSTOMY CATH  07/08/2017   LAPAROSCOPIC ASSISTED VAGINAL HYSTERECTOMY  03-02-2003   W/  BILATERAL SALPINGOOPHORECTOMY   LAPAROSCOPY  EXTENSIVE LYSIS ADHESIONS/  REDO PARAESOPHAGEAL HIATAL HERNIA WITH PRIMARY CLOSURE AND MESH/ NISSEN FUNDOPLICATION (1cm)/  REPAIR GASTROTOMY  08-22-2009   NEPHROLITHOTOMY Right 07/08/2017   Procedure: NEPHROLITHOTOMY PERCUTANEOUS;  Surgeon: Jerilee Field, MD;  Location: [facility];  Service: Urology;  Laterality: Right;   NISSEN FUNDOPLICATION  2000   W/  CHOLECYSTECTOMY   SHOULDER ARTHROSCOPY WITH SUBACROMIAL DECOMPRESSION, ROTATOR CUFF REPAIR AND BICEP TENDON REPAIR Left 08-02-2010   AND LABRAL DEBRIDEMENT   TOTAL KNEE ARTHROPLASTY Right 07-01-2006   TOTAL KNEE ARTHROPLASTY Left 06/08/2020   Procedure: LEFT TOTAL KNEE ARTHROPLASTY;  Surgeon: Cammy Copa, MD;  Location: [facility];  Service: Orthopedics;  Laterality: Left;   TOTAL KNEE REVISION  02/25/2012   Procedure: TOTAL KNEE REVISION;  Surgeon: Cammy Copa, MD;  Location: [facility];  Service: Orthopedics;  Laterality: Right;  Revise right total knee arthroplasty   TUBAL LIGATION  1990   WRIST FUSION Left 2004   RETAINED HARDWARE   Social History   Occupational History   Not on file  Tobacco Use   Smoking status: Never   Smokeless tobacco: Never  Vaping Use   Vaping Use: Never used  Substance and Sexual Activity   Alcohol use: No   Drug use: No   Sexual activity: Not on file    Comment: Hysterectomy

## 2020-11-10 ENCOUNTER — Other Ambulatory Visit: Payer: Self-pay | Admitting: Specialist

## 2020-11-10 MED ORDER — OXYCODONE HCL 5 MG PO CAPS: 5.0000 mg | ORAL_CAPSULE | ORAL | 0 refills | Status: DC | PRN

## 2020-11-14 ENCOUNTER — Other Ambulatory Visit: Payer: Self-pay | Admitting: Radiology

## 2020-11-14 MED ORDER — OXYCODONE HCL 10 MG PO TABS: 5.0000 mg | ORAL_TABLET | Freq: Four times a day (QID) | ORAL | 0 refills | Status: AC | PRN

## 2020-11-14 NOTE — Telephone Encounter (Signed)
Her insurance does NOT cover capsules, I called the pharmacy and asked to have it changed to tablet but they advised that they will need a new rx with tablet on it.

## 2020-11-15 ENCOUNTER — Encounter: Payer: Medicare Other | Admitting: Surgery

## 2020-12-04 ENCOUNTER — Other Ambulatory Visit: Payer: Self-pay | Admitting: Specialist

## 2020-12-07 ENCOUNTER — Ambulatory Visit: Payer: Medicare Other | Admitting: Specialist

## 2020-12-27 ENCOUNTER — Ambulatory Visit: Payer: Medicare Other | Admitting: Surgery

## 2020-12-28 ENCOUNTER — Ambulatory Visit: Payer: Medicare Other | Admitting: Surgery

## 2021-01-03 ENCOUNTER — Ambulatory Visit: Payer: Self-pay

## 2021-01-03 ENCOUNTER — Ambulatory Visit (INDEPENDENT_AMBULATORY_CARE_PROVIDER_SITE_OTHER): Payer: Medicare Other | Admitting: Surgery

## 2021-01-03 ENCOUNTER — Other Ambulatory Visit: Payer: Self-pay

## 2021-01-03 DIAGNOSIS — Z981 Arthrodesis status: Secondary | ICD-10-CM

## 2021-01-16 NOTE — Progress Notes (Signed)
Office Visit Note   Patient: [name]           Date of Birth: [birth year]           MRN: [id number] Visit Date: 01/03/2021              Requested by: Courtney Paris, NP [address] [place],  [place] [address] PCP: Courtney Paris, NP   Assessment & Plan: Visit Diagnoses:  1. S/P cervical spinal fusion   Noncompliant with postop appointments.  Status post cervical fusion Oct 20, 2020  Plan: I reviewed patient's x-rays today with Dr. Otelia Sergeant.  She still has about 5 mm of motion with flexion and extension.  I stressed the patient that she must continue to wear her cervical collar.  Discussed that she is putting herself at risk for needing another surgery with posterior fusion.  Says that she will be compliant.  Also stressed her the importance of being compliant with postop follow-ups.  Follow-Up Instructions: Return in about 4 weeks (around 01/31/2021) for with dr Otelia Sergeant recheck postop cervical ACDF corpectomy.   Orders:  Orders Placed This Encounter  Procedures   XR Cervical Spine 2 or 3 views   No orders of the defined types were placed in this encounter.     Procedures: No procedures performed   Clinical Data: No additional findings.   Subjective: Chief Complaint  Patient presents with   Neck - Routine Post Op    HPI 65 year old white female who is almost 11 weeks status post C5-6 and C6-7 ACDF, C6 corpectomy with C5-C7 plate returns.  Patient last seen by Dr. Otelia Sergeant November 09, 2020 and she has missed her last 7 appointments.  States that she has had a trouble with transportation.  States that her neck continues to bother her some.  Has some neck spasms.  Denies upper extremity radicular symptoms.  She has not been overly compliant with wearing her cervical collar. Review of Systems No current cardiopulmonary GI GU issues  Objective: Vital Signs: There were no vitals taken for this visit.  Physical Exam HENT:     Head: Normocephalic.  Eyes:     Extraocular  Movements: Extraocular movements intact.  Pulmonary:     Effort: No respiratory distress.  Musculoskeletal:     Comments: Patient does have some bilateral brachial plexus and trapezius tenderness.  No focal motor deficits.  Neurological:     General: No focal deficit present.     Mental Status: She is alert and oriented to person, place, and time.  Psychiatric:        Mood and Affect: Mood normal.    Ortho Exam  Specialty Comments:  No specialty comments available.  Imaging: No results found.   PMFS History: Patient Active Problem List   Diagnosis Date Noted   Cervical spondylosis with myelopathy 10/20/2020    Class: Chronic   Cervical cord compression with myelopathy (HCC) 10/18/2020   Spinal stenosis, cervical region 10/18/2020   Polypharmacy 10/18/2020   CAD (coronary artery disease) 10/18/2020   Weakness 10/14/2020   Fall at home, initial encounter 10/14/2020   S/P total knee arthroplasty, 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   Nephrolithiasis 07/08/2017   Abnormal EKG 06/18/2017   Preop cardiovascular exam 06/18/2017   History of myocardial infarction 06/18/2017   Chronic joint pain 10/08/2016   Asthma 10/08/2016   History of kidney stones 10/08/2016   Insomnia 10/08/2016   Diabetes mellitus type 2, noninsulin dependent (HCC) 09/10/2013   Obesity (BMI 30-39.9) 09/10/2013  GERD 03/15/2009   Past Medical History:  Diagnosis Date   Anxiety    Arthritis    Asthma    Depression    Dysrhythmia    History of GI bleed    2010--- UPPER ESOPHAGITIS/ DUODERAL ULCER/ EROSION   History of hiatal hernia    History of kidney stones    History of shingles    2013   Myocardial infarction (HCC) 10 years ago    Nephrolithiasis    BILATERAL   Right ureteral stone    Type 2 diabetes, diet controlled (HCC)    Urgency of urination     Family History  Problem Relation Age of Onset   Heart disease Father     Past Surgical History:  Procedure Laterality Date   ANTERIOR CERVICAL  DECOMP/DISCECTOMY FUSION N/A 10/20/2020   Procedure: C5-6, C6-7 ANTERIOR CERVICAL DISCECTOMY, C6 CORPECTOMY, C5-C7 PLATE, ALLOGRAFT;  Surgeon: Kerrin Champagne, MD;  Location: [facility];  Service: Orthopedics;  Laterality: N/A;   CARDIAC CATHETERIZATION  02-10-2009  DR BENSIMHON   NORMAL CORONARIES/  LOW NORMAL LVF WITHOUT WALL MOTION ABNORMALITIES   CARDIAC CATHETERIZATION  11-21-2010  DR Jacinto Halim   NORMAL CORONARIES/  EF 50-55%   CHOLECYSTECTOMY  1990s   back in [place]    CYSTOSCOPY W/ URETERAL STENT PLACEMENT Right 09/04/2013   Procedure: CYSTOSCOPY WITH RETROGRADE PYELOGRAM/URETERAL STENT PLACEMENT;  Surgeon: Milford Cage, MD;  Location: [facility];  Service: Urology;  Laterality: Right;   CYSTOSCOPY WITH RETROGRADE PYELOGRAM, URETEROSCOPY AND STENT PLACEMENT Right 09/27/2013   Procedure: CYSTOSCOPY WITH RETROGRADE PYELOGRAM, URETEROSCOPY AND STENT EXCHANGE;  Surgeon: Milford Cage, MD;  Location: [facility];  Service: Urology;  Laterality: Right;   HOLMIUM LASER APPLICATION Right 09/27/2013   Procedure: HOLMIUM LASER APPLICATION;  Surgeon: Milford Cage, MD;  Location: [facility];  Service: Urology;  Laterality: Right;   HYSTEROSCOPY WITH D & C  01-25-2003   IR URETERAL STENT RIGHT NEW ACCESS W/O SEP NEPHROSTOMY CATH  07/08/2017   LAPAROSCOPIC ASSISTED VAGINAL HYSTERECTOMY  03-02-2003   W/  BILATERAL SALPINGOOPHORECTOMY   LAPAROSCOPY  EXTENSIVE LYSIS ADHESIONS/  REDO PARAESOPHAGEAL HIATAL HERNIA WITH PRIMARY CLOSURE AND MESH/ NISSEN FUNDOPLICATION (1cm)/  REPAIR GASTROTOMY  08-22-2009   NEPHROLITHOTOMY Right 07/08/2017   Procedure: NEPHROLITHOTOMY PERCUTANEOUS;  Surgeon: Jerilee Field, MD;  Location: [facility];  Service: Urology;  Laterality: Right;   NISSEN FUNDOPLICATION  2000   W/  CHOLECYSTECTOMY   SHOULDER ARTHROSCOPY WITH SUBACROMIAL DECOMPRESSION, ROTATOR CUFF REPAIR AND BICEP TENDON REPAIR Left 08-02-2010   AND LABRAL DEBRIDEMENT   TOTAL  KNEE ARTHROPLASTY Right 07-01-2006   TOTAL KNEE ARTHROPLASTY Left 06/08/2020   Procedure: LEFT TOTAL KNEE ARTHROPLASTY;  Surgeon: Cammy Copa, MD;  Location: [facility];  Service: Orthopedics;  Laterality: Left;   TOTAL KNEE REVISION  02/25/2012   Procedure: TOTAL KNEE REVISION;  Surgeon: Cammy Copa, MD;  Location: [facility];  Service: Orthopedics;  Laterality: Right;  Revise right total knee arthroplasty   TUBAL LIGATION  1990   WRIST FUSION Left 2004   RETAINED HARDWARE   Social History   Occupational History   Not on file  Tobacco Use   Smoking status: Never   Smokeless tobacco: Never  Vaping Use   Vaping Use: Never used  Substance and Sexual Activity   Alcohol use: No   Drug use: No   Sexual activity: Not on file    Comment: Hysterectomy

## 2021-01-24 ENCOUNTER — Telehealth: Payer: Self-pay | Admitting: Surgery

## 2021-01-24 NOTE — Telephone Encounter (Signed)
I called and advised patient about what Dr. Otelia Sergeant said about driving and she is ok with that

## 2021-01-24 NOTE — Telephone Encounter (Signed)
I called back and lmom and asked if she can come back in on 02/01/21 @ 1:30to see Dr. Otelia Sergeant, Fayrene Fearing wanted her to see him since it had been a while since she had seen him. I asked for her to call me back and let me know.

## 2021-01-24 NOTE — Telephone Encounter (Signed)
Per Dr. Otelia Sergeant, she can drive short distances, no more that 15 minute trips.

## 2021-01-24 NOTE — Telephone Encounter (Signed)
Pt called asking if she could drive yet? She would like a CB to discuss and asked we LVM if she doesn't answer.   [phone]

## 2021-01-31 ENCOUNTER — Ambulatory Visit: Payer: Medicare Other | Admitting: Surgery

## 2021-02-01 ENCOUNTER — Ambulatory Visit: Payer: Medicare Other | Admitting: Specialist

## 2021-02-08 ENCOUNTER — Ambulatory Visit: Payer: Medicare Other | Admitting: Specialist

## 2021-02-19 ENCOUNTER — Telehealth: Payer: Self-pay | Admitting: Specialist

## 2021-02-19 NOTE — Telephone Encounter (Signed)
Pt states when she take neck brace off, she gets a headache? IS that normal?  CB [phone]

## 2021-02-28 NOTE — Telephone Encounter (Signed)
Tried to call but unable to complete call per phone company

## 2021-03-06 NOTE — Telephone Encounter (Signed)
I called and lmom advised that she needs to keep her appt on 10/20 @ 315 with Dr. Otelia Sergeant so that this can be discussed

## 2021-03-15 ENCOUNTER — Ambulatory Visit (INDEPENDENT_AMBULATORY_CARE_PROVIDER_SITE_OTHER): Payer: Medicare Other | Admitting: Specialist

## 2021-03-15 ENCOUNTER — Encounter: Payer: Self-pay | Admitting: Specialist

## 2021-03-15 ENCOUNTER — Other Ambulatory Visit: Payer: Self-pay

## 2021-03-15 ENCOUNTER — Ambulatory Visit: Payer: Self-pay

## 2021-03-15 VITALS — BP 107/74 | HR 80 | Ht 63.0 in | Wt 164.0 lb

## 2021-03-15 DIAGNOSIS — M4317 Spondylolisthesis, lumbosacral region: Secondary | ICD-10-CM

## 2021-03-15 DIAGNOSIS — M5136 Other intervertebral disc degeneration, lumbar region: Secondary | ICD-10-CM | POA: Diagnosis not present

## 2021-03-15 DIAGNOSIS — Z981 Arthrodesis status: Secondary | ICD-10-CM | POA: Diagnosis not present

## 2021-03-15 DIAGNOSIS — G9589 Other specified diseases of spinal cord: Secondary | ICD-10-CM

## 2021-03-15 NOTE — Progress Notes (Signed)
Office Visit Note   Patient: [name]           Date of Birth: [birth year]           MRN: [id number] Visit Date: 03/15/2021              Requested by: Courtney Paris, NP [address] [place],  [place] [address] PCP: Courtney Paris, NP   Assessment & Plan: Visit Diagnoses:  1. S/P cervical spinal fusion   2. Spondylolisthesis, lumbosacral region   3. Degenerative disc disease, lumbar   4. Myelomalacia of cervical cord (HCC)     Plan: Avoid overhead lifting and overhead use of the arms. Do not lift greater than 5 lbs. Adjust head rest in vehicle to prevent hyperextension if rear ended. Take extra precautions to avoid falling. CT scan of the cervical spine and stay in soft collar except to bath till the results of the CT scan is known.  Assess healing of the corpectomy site with CT scan and the adequacy of her cervical decompression.  Follow-Up Instructions: Return in about 3 weeks (around 04/05/2021).   Orders:  Orders Placed This Encounter  Procedures   XR Cervical Spine 2 or 3 views   CT CERVICAL SPINE WO CONTRAST   No orders of the defined types were placed in this encounter.     Procedures: No procedures performed   Clinical Data: No additional findings.   Subjective: Chief Complaint  Patient presents with   Neck - Pain    65 year old right handed female post C6 corpectomy for severe cervical stenosis C5-6 and C6-7 with plate and screws anteriorly and strut graft. Complaints of low back pain and popping and inter scapular pain and pain into the shoulders. History of chronic pain management but this was while she had severe cervical stenosis.    Review of Systems  Constitutional: Negative.   HENT: Negative.    Eyes: Negative.   Respiratory: Negative.    Cardiovascular: Negative.   Gastrointestinal: Negative.   Endocrine: Negative.   Genitourinary: Negative.   Musculoskeletal: Negative.   Skin: Negative.   Allergic/Immunologic: Negative.    Neurological: Negative.   Hematological: Negative.   Psychiatric/Behavioral: Negative.      Objective: Vital Signs: BP 107/74   Pulse 80   Ht 5\' 3"  (1.6 m)   Wt 164 lb (74.4 kg)   BMI 29.05 kg/m   Physical Exam  Ortho Exam  Specialty Comments:  No specialty comments available.  Imaging: No results found.   PMFS History: Patient Active Problem List   Diagnosis Date Noted   Cervical spondylosis with myelopathy 10/20/2020    Priority: High    Class: Chronic   Cervical cord compression with myelopathy (HCC) 10/18/2020   Spinal stenosis, cervical region 10/18/2020   Polypharmacy 10/18/2020   CAD (coronary artery disease) 10/18/2020   Weakness 10/14/2020   Fall at home, initial encounter 10/14/2020   S/P total knee arthroplasty, 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   Nephrolithiasis 07/08/2017   Abnormal EKG 06/18/2017   Preop cardiovascular exam 06/18/2017   History of myocardial infarction 06/18/2017   Chronic joint pain 10/08/2016   Asthma 10/08/2016   History of kidney stones 10/08/2016   Insomnia 10/08/2016   Diabetes mellitus type 2, noninsulin dependent (HCC) 09/10/2013   Obesity (BMI 30-39.9) 09/10/2013   GERD 03/15/2009   Past Medical History:  Diagnosis Date   Anxiety    Arthritis    Asthma    Depression    Dysrhythmia  History of GI bleed    2010--- UPPER ESOPHAGITIS/ DUODERAL ULCER/ EROSION   History of hiatal hernia    History of kidney stones    History of shingles    2013   Myocardial infarction (HCC) 10 years ago    Nephrolithiasis    BILATERAL   Right ureteral stone    Type 2 diabetes, diet controlled (HCC)    Urgency of urination     Family History  Problem Relation Age of Onset   Heart disease Father     Past Surgical History:  Procedure Laterality Date   ANTERIOR CERVICAL DECOMP/DISCECTOMY FUSION N/A 10/20/2020   Procedure: C5-6, C6-7 ANTERIOR CERVICAL DISCECTOMY, C6 CORPECTOMY, C5-C7 PLATE, ALLOGRAFT;  Surgeon: Kerrin Champagne, MD;  Location: [facility];  Service: Orthopedics;  Laterality: N/A;   CARDIAC CATHETERIZATION  02-10-2009  DR BENSIMHON   NORMAL CORONARIES/  LOW NORMAL LVF WITHOUT WALL MOTION ABNORMALITIES   CARDIAC CATHETERIZATION  11-21-2010  DR Jacinto Halim   NORMAL CORONARIES/  EF 50-55%   CHOLECYSTECTOMY  1990s   back in [place]    CYSTOSCOPY W/ URETERAL STENT PLACEMENT Right 09/04/2013   Procedure: CYSTOSCOPY WITH RETROGRADE PYELOGRAM/URETERAL STENT PLACEMENT;  Surgeon: Milford Cage, MD;  Location: [facility];  Service: Urology;  Laterality: Right;   CYSTOSCOPY WITH RETROGRADE PYELOGRAM, URETEROSCOPY AND STENT PLACEMENT Right 09/27/2013   Procedure: CYSTOSCOPY WITH RETROGRADE PYELOGRAM, URETEROSCOPY AND STENT EXCHANGE;  Surgeon: Milford Cage, MD;  Location: [facility];  Service: Urology;  Laterality: Right;   HOLMIUM LASER APPLICATION Right 09/27/2013   Procedure: HOLMIUM LASER APPLICATION;  Surgeon: Milford Cage, MD;  Location: [facility];  Service: Urology;  Laterality: Right;   HYSTEROSCOPY WITH D & C  01-25-2003   IR URETERAL STENT RIGHT NEW ACCESS W/O SEP NEPHROSTOMY CATH  07/08/2017   LAPAROSCOPIC ASSISTED VAGINAL HYSTERECTOMY  03-02-2003   W/  BILATERAL SALPINGOOPHORECTOMY   LAPAROSCOPY  EXTENSIVE LYSIS ADHESIONS/  REDO PARAESOPHAGEAL HIATAL HERNIA WITH PRIMARY CLOSURE AND MESH/ NISSEN FUNDOPLICATION (1cm)/  REPAIR GASTROTOMY  08-22-2009   NEPHROLITHOTOMY Right 07/08/2017   Procedure: NEPHROLITHOTOMY PERCUTANEOUS;  Surgeon: Jerilee Field, MD;  Location: [facility];  Service: Urology;  Laterality: Right;   NISSEN FUNDOPLICATION  2000   W/  CHOLECYSTECTOMY   SHOULDER ARTHROSCOPY WITH SUBACROMIAL DECOMPRESSION, ROTATOR CUFF REPAIR AND BICEP TENDON REPAIR Left 08-02-2010   AND LABRAL DEBRIDEMENT   TOTAL KNEE ARTHROPLASTY Right 07-01-2006   TOTAL KNEE ARTHROPLASTY Left 06/08/2020   Procedure: LEFT TOTAL KNEE ARTHROPLASTY;  Surgeon: Cammy Copa, MD;  Location: [facility];   Service: Orthopedics;  Laterality: Left;   TOTAL KNEE REVISION  02/25/2012   Procedure: TOTAL KNEE REVISION;  Surgeon: Cammy Copa, MD;  Location: [facility] OR;  Service: Orthopedics;  Laterality: Right;  Revise right total knee arthroplasty   TUBAL LIGATION  1990   WRIST FUSION Left 2004   RETAINED HARDWARE   Social History   Occupational History   Not on file  Tobacco Use   Smoking status: Never   Smokeless tobacco: Never  Vaping Use   Vaping Use: Never used  Substance and Sexual Activity   Alcohol use: No   Drug use: No   Sexual activity: Not on file    Comment: Hysterectomy       ement. No falls post corpectomy.

## 2021-03-15 NOTE — Patient Instructions (Addendum)
Avoid overhead lifting and overhead use of the arms. Do not lift greater than 5 lbs. Adjust head rest in vehicle to prevent hyperextension if rear ended. Take extra precautions to avoid falling. CT scan of the cervical spine and stay in soft collar except to bath till the results of the CT scan is known. Assess healing of the corpectomy site with CT scan and the adequacy of her cervical decompression.  Soft collar.

## 2021-04-02 ENCOUNTER — Other Ambulatory Visit: Payer: Medicare Other

## 2021-04-18 ENCOUNTER — Ambulatory Visit: Payer: Medicare Other | Admitting: Specialist

## 2021-04-18 DIAGNOSIS — Z981 Arthrodesis status: Secondary | ICD-10-CM

## 2021-09-12 ENCOUNTER — Ambulatory Visit: Payer: Medicare Other | Admitting: Orthopedic Surgery

## 2021-09-13 ENCOUNTER — Ambulatory Visit: Payer: Medicare Other | Admitting: Specialist

## 2021-10-04 ENCOUNTER — Other Ambulatory Visit: Payer: Self-pay

## 2021-10-04 ENCOUNTER — Emergency Department (HOSPITAL_COMMUNITY): Payer: Medicare Other

## 2021-10-04 ENCOUNTER — Emergency Department (HOSPITAL_BASED_OUTPATIENT_CLINIC_OR_DEPARTMENT_OTHER): Payer: Medicare Other

## 2021-10-04 ENCOUNTER — Encounter (HOSPITAL_COMMUNITY): Payer: Self-pay

## 2021-10-04 ENCOUNTER — Inpatient Hospital Stay (HOSPITAL_COMMUNITY)
Admission: EM | Admit: 2021-10-04 | Discharge: 2021-10-07 | DRG: 312 | Disposition: A | Discharge status: Home Health | Payer: Medicare Other | Attending: Family Medicine | Admitting: Family Medicine

## 2021-10-04 DIAGNOSIS — Z6829 Body mass index (BMI) 29.0-29.9, adult: Secondary | ICD-10-CM

## 2021-10-04 DIAGNOSIS — R55 Syncope and collapse: Principal | ICD-10-CM | POA: Diagnosis present

## 2021-10-04 DIAGNOSIS — Z7982 Long term (current) use of aspirin: Secondary | ICD-10-CM

## 2021-10-04 DIAGNOSIS — M79605 Pain in left leg: Secondary | ICD-10-CM | POA: Diagnosis present

## 2021-10-04 DIAGNOSIS — M7989 Other specified soft tissue disorders: Secondary | ICD-10-CM | POA: Diagnosis not present

## 2021-10-04 DIAGNOSIS — E11649 Type 2 diabetes mellitus with hypoglycemia without coma: Secondary | ICD-10-CM | POA: Diagnosis present

## 2021-10-04 DIAGNOSIS — Z794 Long term (current) use of insulin: Secondary | ICD-10-CM

## 2021-10-04 DIAGNOSIS — Z9181 History of falling: Secondary | ICD-10-CM

## 2021-10-04 DIAGNOSIS — Z79899 Other long term (current) drug therapy: Secondary | ICD-10-CM

## 2021-10-04 DIAGNOSIS — T50995A Adverse effect of other drugs, medicaments and biological substances, initial encounter: Secondary | ICD-10-CM | POA: Diagnosis present

## 2021-10-04 DIAGNOSIS — E860 Dehydration: Secondary | ICD-10-CM | POA: Diagnosis present

## 2021-10-04 DIAGNOSIS — K219 Gastro-esophageal reflux disease without esophagitis: Secondary | ICD-10-CM | POA: Diagnosis present

## 2021-10-04 DIAGNOSIS — E785 Hyperlipidemia, unspecified: Secondary | ICD-10-CM | POA: Diagnosis present

## 2021-10-04 DIAGNOSIS — K5900 Constipation, unspecified: Secondary | ICD-10-CM | POA: Diagnosis present

## 2021-10-04 DIAGNOSIS — Z90710 Acquired absence of both cervix and uterus: Secondary | ICD-10-CM

## 2021-10-04 DIAGNOSIS — N179 Acute kidney failure, unspecified: Secondary | ICD-10-CM | POA: Diagnosis present

## 2021-10-04 DIAGNOSIS — J45909 Unspecified asthma, uncomplicated: Secondary | ICD-10-CM | POA: Diagnosis present

## 2021-10-04 DIAGNOSIS — I252 Old myocardial infarction: Secondary | ICD-10-CM

## 2021-10-04 DIAGNOSIS — R008 Other abnormalities of heart beat: Secondary | ICD-10-CM | POA: Diagnosis present

## 2021-10-04 DIAGNOSIS — I493 Ventricular premature depolarization: Secondary | ICD-10-CM | POA: Diagnosis present

## 2021-10-04 DIAGNOSIS — I700 Atherosclerosis of aorta: Secondary | ICD-10-CM | POA: Diagnosis present

## 2021-10-04 DIAGNOSIS — Z8249 Family history of ischemic heart disease and other diseases of the circulatory system: Secondary | ICD-10-CM

## 2021-10-04 DIAGNOSIS — E875 Hyperkalemia: Secondary | ICD-10-CM | POA: Diagnosis present

## 2021-10-04 DIAGNOSIS — R011 Cardiac murmur, unspecified: Secondary | ICD-10-CM | POA: Diagnosis present

## 2021-10-04 DIAGNOSIS — Z79891 Long term (current) use of opiate analgesic: Secondary | ICD-10-CM

## 2021-10-04 DIAGNOSIS — F32A Depression, unspecified: Secondary | ICD-10-CM | POA: Diagnosis present

## 2021-10-04 DIAGNOSIS — I472 Ventricular tachycardia, unspecified: Secondary | ICD-10-CM | POA: Diagnosis present

## 2021-10-04 DIAGNOSIS — Z87442 Personal history of urinary calculi: Secondary | ICD-10-CM

## 2021-10-04 DIAGNOSIS — M549 Dorsalgia, unspecified: Secondary | ICD-10-CM | POA: Diagnosis present

## 2021-10-04 DIAGNOSIS — G8929 Other chronic pain: Secondary | ICD-10-CM | POA: Diagnosis present

## 2021-10-04 DIAGNOSIS — F419 Anxiety disorder, unspecified: Secondary | ICD-10-CM | POA: Diagnosis present

## 2021-10-04 DIAGNOSIS — Z7984 Long term (current) use of oral hypoglycemic drugs: Secondary | ICD-10-CM

## 2021-10-04 DIAGNOSIS — Z981 Arthrodesis status: Secondary | ICD-10-CM

## 2021-10-04 DIAGNOSIS — Z96653 Presence of artificial knee joint, bilateral: Secondary | ICD-10-CM | POA: Diagnosis present

## 2021-10-04 DIAGNOSIS — F39 Unspecified mood [affective] disorder: Secondary | ICD-10-CM | POA: Diagnosis present

## 2021-10-04 DIAGNOSIS — Y92219 Unspecified school as the place of occurrence of the external cause: Secondary | ICD-10-CM

## 2021-10-04 DIAGNOSIS — G4700 Insomnia, unspecified: Secondary | ICD-10-CM | POA: Diagnosis present

## 2021-10-04 DIAGNOSIS — Z8619 Personal history of other infectious and parasitic diseases: Secondary | ICD-10-CM

## 2021-10-04 DIAGNOSIS — I2510 Atherosclerotic heart disease of native coronary artery without angina pectoris: Secondary | ICD-10-CM | POA: Diagnosis present

## 2021-10-04 DIAGNOSIS — E669 Obesity, unspecified: Secondary | ICD-10-CM | POA: Diagnosis present

## 2021-10-04 DIAGNOSIS — Z9049 Acquired absence of other specified parts of digestive tract: Secondary | ICD-10-CM

## 2021-10-04 DIAGNOSIS — Z90722 Acquired absence of ovaries, bilateral: Secondary | ICD-10-CM

## 2021-10-04 LAB — COMPREHENSIVE METABOLIC PANEL
ALT: 13 U/L (ref 0–44)
AST: 18 U/L (ref 15–41)
Albumin: 3.6 g/dL (ref 3.5–5.0)
Alkaline Phosphatase: 90 U/L (ref 38–126)
Anion gap: 4 — ABNORMAL LOW (ref 5–15)
BUN: 48 mg/dL — ABNORMAL HIGH (ref 8–23)
CO2: 26 mmol/L (ref 22–32)
Calcium: 9.2 mg/dL (ref 8.9–10.3)
Chloride: 109 mmol/L (ref 98–111)
Creatinine, Ser: 2.58 mg/dL — ABNORMAL HIGH (ref 0.44–1.00)
GFR, Estimated: 20 mL/min — ABNORMAL LOW (ref >60)
Glucose, Bld: 116 mg/dL — ABNORMAL HIGH (ref 70–99)
Potassium: 5.2 mmol/L — ABNORMAL HIGH (ref 3.5–5.1)
Sodium: 139 mmol/L (ref 135–145)
Total Bilirubin: 0.8 mg/dL (ref 0.3–1.2)
Total Protein: 6.7 g/dL (ref 6.5–8.1)

## 2021-10-04 LAB — CBC WITH DIFFERENTIAL/PLATELET
Abs Immature Granulocytes: 0.03 10*3/uL (ref 0.00–0.07)
Basophils Absolute: 0 10*3/uL (ref 0.0–0.1)
Basophils Relative: 0 %
Eosinophils Absolute: 0.2 10*3/uL (ref 0.0–0.5)
Eosinophils Relative: 2 %
HCT: 36.5 % (ref 36.0–46.0)
Hemoglobin: 11.3 g/dL — ABNORMAL LOW (ref 12.0–15.0)
Immature Granulocytes: 0 %
Lymphocytes Relative: 24 %
Lymphs Abs: 2.1 10*3/uL (ref 0.7–4.0)
MCH: 28.2 pg (ref 26.0–34.0)
MCHC: 31 g/dL (ref 30.0–36.0)
MCV: 91 fL (ref 80.0–100.0)
Monocytes Absolute: 0.7 10*3/uL (ref 0.1–1.0)
Monocytes Relative: 8 %
Neutro Abs: 5.8 10*3/uL (ref 1.7–7.7)
Neutrophils Relative %: 66 %
Platelets: 198 10*3/uL (ref 150–400)
RBC: 4.01 MIL/uL (ref 3.87–5.11)
RDW: 14.3 % (ref 11.5–15.5)
WBC: 8.9 10*3/uL (ref 4.0–10.5)
nRBC: 0 % (ref 0.0–0.2)

## 2021-10-04 LAB — ETHANOL: Alcohol, Ethyl (B): <10 mg/dL (ref <10)

## 2021-10-04 LAB — TROPONIN I (HIGH SENSITIVITY)
Troponin I (High Sensitivity): 16 ng/L (ref <18)
Troponin I (High Sensitivity): 14 ng/L (ref <18)

## 2021-10-04 LAB — MAGNESIUM: Magnesium: 2.1 mg/dL (ref 1.7–2.4)

## 2021-10-04 LAB — GLUCOSE, CAPILLARY: Glucose-Capillary: 115 mg/dL — ABNORMAL HIGH (ref 70–99)

## 2021-10-04 MED ORDER — LACTATED RINGERS IV BOLUS
500.0000 mL | Freq: Once | INTRAVENOUS | Status: AC
  Administered 2021-10-04: 500 mL via INTRAVENOUS

## 2021-10-04 MED ORDER — ENOXAPARIN SODIUM 30 MG/0.3ML IJ SOSY
30.0000 mg | PREFILLED_SYRINGE | INTRAMUSCULAR | Status: DC
  Administered 2021-10-04 – 2021-10-05 (×2): 30 mg via SUBCUTANEOUS
  Filled 2021-10-04 (×2): qty 0.3

## 2021-10-04 MED ORDER — INSULIN ASPART 100 UNIT/ML IJ SOLN
0.0000 [IU] | INTRAMUSCULAR | Status: DC
  Administered 2021-10-05 – 2021-10-06 (×3): 1 [IU] via SUBCUTANEOUS
  Administered 2021-10-06 (×2): 2 [IU] via SUBCUTANEOUS

## 2021-10-04 MED ORDER — SODIUM CHLORIDE 0.9 % IV BOLUS
500.0000 mL | Freq: Once | INTRAVENOUS | Status: AC
  Administered 2021-10-04: 500 mL via INTRAVENOUS

## 2021-10-04 NOTE — ED Notes (Signed)
Tammie Weatherly (Daughter) of Alyse in 25 called and ask for an update. Her number is [phone]. ?

## 2021-10-04 NOTE — ED Notes (Signed)
ED TO INPATIENT HANDOFF REPORT ? ?ED Nurse Name and Phone #: Fredric MareBailey RN [phone] ? ?S ?Name/Age/Gender ?Jasmine ForestLinda S Anderson ?66 y.o. ?female ?Room/Bed: 023C/023C ? ?Code Status ?  Code Status: Prior ? ?Home/SNF/Other ?Home ?Patient oriented to: self, place, time, and situation ?Is this baseline? Yes  ? ?Triage Complete: Triage complete  ?Chief Complaint ?Syncope [R55] ? ?Triage Note ?Pt BIB [facility] from the school c/o a syncopal episode. Pt was in the car rider line waiting to pick up here grand kids when she had a syncopal episode. Another bystander saw it happen and called EMS. When EMS arrived pt was disoriented. Pt received 500 NS. Pt became alert and oriented x4 enroute. Pt c/o of some dizziness. Per EMS initially BP was in the 90's. Pt denies any pain.    ? ?Allergies ?No Known Allergies ? ?Level of Care/Admitting Diagnosis ?ED Disposition   ? ? ED Disposition  ?Admit  ? Condition  ?--  ? Comment  ?[facility] [[id number]] ? Level of Care: Telemetry Medical [104] ? [facility] if equivalent level of care is available:: No ? Covid Evaluation: Asymptomatic - no recent exposure (last 10 days) testing not required ? Diagnosis: Syncope [206001] ? Admitting Physician: Levin ErpJAGADISH, MAYURI [[id number]] ? Attending Physician: Latrelle DodrillMCINTYRE, BRITTANY J [phone][[id number]] ?  ?  ? ?  ? ? ?B ?Medical/Surgery History ?Past Medical History:  ?Diagnosis Date  ? Anxiety   ? Arthritis   ? Asthma   ? Depression   ? Dysrhythmia   ? History of GI bleed   ? 2010--- UPPER ESOPHAGITIS/ DUODERAL ULCER/ EROSION  ? History of hiatal hernia   ? History of kidney stones   ? History of shingles   ? 2013  ? Myocardial infarction (HCC) 10 years ago   ? Nephrolithiasis   ? BILATERAL  ? Right ureteral stone   ? Type 2 diabetes, diet controlled (HCC)   ? Urgency of urination   ? ?Past Surgical History:  ?Procedure Laterality Date  ? ANTERIOR CERVICAL DECOMP/DISCECTOMY FUSION N/A 10/20/2020  ?  Procedure: C5-6, C6-7 ANTERIOR CERVICAL DISCECTOMY, C6 CORPECTOMY, C5-C7 PLATE, ALLOGRAFT;  Surgeon: Kerrin ChampagneNitka, James E, MD;  Location: [facility];  Service: Orthopedics;  Laterality: N/A;  ? CARDIAC CATHETERIZATION  02-10-2009  DR BENSIMHON  ? NORMAL CORONARIES/  LOW NORMAL LVF WITHOUT WALL MOTION ABNORMALITIES  ? CARDIAC CATHETERIZATION  11-21-2010  DR Jacinto HalimGANJI  ? NORMAL CORONARIES/  EF 50-55%  ? CHOLECYSTECTOMY  1990s  ? back in [place]   ? CYSTOSCOPY W/ URETERAL STENT PLACEMENT Right 09/04/2013  ? Procedure: CYSTOSCOPY WITH RETROGRADE PYELOGRAM/URETERAL STENT PLACEMENT;  Surgeon: Milford Cageaniel Young Woodruff, MD;  [facility];  Service: Urology;  Laterality: Right;  ? CYSTOSCOPY WITH RETROGRADE PYELOGRAM, URETEROSCOPY AND STENT PLACEMENT Right 09/27/2013  ? Procedure: CYSTOSCOPY WITH RETROGRADE PYELOGRAM, URETEROSCOPY AND STENT EXCHANGE;  Surgeon: Milford Cageaniel Young Woodruff, MD;  [facility];  Service: Urology;  Laterality: Right;  ? HOLMIUM LASER APPLICATION Right 09/27/2013  ? Procedure: HOLMIUM LASER APPLICATION;  Surgeon: Milford Cageaniel Young Woodruff, MD;  [facility];  Service: Urology;  Laterality: Right;  ? HYSTEROSCOPY WITH D & C  01-25-2003  ? IR URETERAL STENT RIGHT NEW ACCESS W/O SEP NEPHROSTOMY CATH  07/08/2017  ? LAPAROSCOPIC ASSISTED VAGINAL HYSTERECTOMY  03-02-2003  ? W/  BILATERAL SALPINGOOPHORECTOMY  ? LAPAROSCOPY  EXTENSIVE LYSIS ADHESIONS/  REDO PARAESOPHAGEAL HIATAL HERNIA WITH PRIMARY CLOSURE AND MESH/  NISSEN FUNDOPLICATION (1cm)/  REPAIR GASTROTOMY  08-22-2009  ? NEPHROLITHOTOMY Right 07/08/2017  ? Procedure: NEPHROLITHOTOMY PERCUTANEOUS;  Surgeon: Jerilee Field, MD;  Location: [facility];  Service: Urology;  Laterality: Right;  ? NISSEN FUNDOPLICATION  2000  ? W/  CHOLECYSTECTOMY  ? SHOULDER ARTHROSCOPY WITH SUBACROMIAL DECOMPRESSION, ROTATOR CUFF REPAIR AND BICEP TENDON REPAIR Left 08-02-2010  ? AND LABRAL DEBRIDEMENT  ? TOTAL KNEE ARTHROPLASTY Right 07-01-2006  ? TOTAL  KNEE ARTHROPLASTY Left 06/08/2020  ? Procedure: LEFT TOTAL KNEE ARTHROPLASTY;  Surgeon: Cammy Copa, MD;  Location: [facility];  Service: Orthopedics;  Laterality: Left;  ? TOTAL KNEE REVISION  02/25/2012  ? Procedure: TOTAL KNEE REVISION;  Surgeon: Cammy Copa, MD;  Location: [facility];  Service: Orthopedics;  Laterality: Right;  Revise right total knee arthroplasty  ? TUBAL LIGATION  1990  ? WRIST FUSION Left 2004  ? RETAINED HARDWARE  ?  ? ?A ?IV Location/Drains/Wounds ?Patient Lines/Drains/Airways Status   ? ? Active Line/Drains/Airways   ? ? Name Placement date Placement time Site Days  ? Peripheral IV 10/25/20 20 G 1.88" Left;Distal Forearm 10/25/20  2018  Forearm  344  ? External Urinary Catheter 10/15/20  0944  --  354  ? Incision (Closed) 10/20/20 Neck 10/20/20  1755  -- 349  ? Wound / Incision (Open or Dehisced) 10/14/20 Non-pressure wound Ankle Left;Lateral 1cm x.5cm x 0 10/14/20  2340  Ankle  355  ? ?  ?  ? ?  ? ? ?Intake/Output Last 24 hours ?No intake or output data in the 24 hours ending 10/04/21 2011 ? ?Labs/Imaging ?Results for orders placed or performed during the hospital encounter of 10/04/21 (from the past 48 hour(s))  ?CBC with Differential     Status: Abnormal  ? Collection Time: 10/04/21  4:28 PM  ?Result Value Ref Range  ? WBC 8.9 4.0 - 10.5 K/uL  ? RBC 4.01 3.87 - 5.11 MIL/uL  ? Hemoglobin 11.3 (L) 12.0 - 15.0 g/dL  ? HCT 36.5 36.0 - 46.0 %  ? MCV 91.0 80.0 - 100.0 fL  ? MCH 28.2 26.0 - 34.0 pg  ? MCHC 31.0 30.0 - 36.0 g/dL  ? RDW 14.3 11.5 - 15.5 %  ? Platelets 198 150 - 400 K/uL  ? nRBC 0.0 0.0 - 0.2 %  ? Neutrophils Relative % 66 %  ? Neutro Abs 5.8 1.7 - 7.7 K/uL  ? Lymphocytes Relative 24 %  ? Lymphs Abs 2.1 0.7 - 4.0 K/uL  ? Monocytes Relative 8 %  ? Monocytes Absolute 0.7 0.1 - 1.0 K/uL  ? Eosinophils Relative 2 %  ? Eosinophils Absolute 0.2 0.0 - 0.5 K/uL  ? Basophils Relative 0 %  ? Basophils Absolute 0.0 0.0 - 0.1 K/uL  ? Immature Granulocytes 0 %  ? Abs Immature Granulocytes  0.03 0.00 - 0.07 K/uL  ?  Comment: Performed at [facility], [address] [place], [place] [address]  ?Comprehensive metabolic panel     Status: Abnormal  ? Collection Time: 10/04/21  4:28 PM  ?Result Value Ref Range  ? Sodium 139 135 - 145 mmol/L  ? Potassium 5.2 (H) 3.5 - 5.1 mmol/L  ? Chloride 109 98 - 111 mmol/L  ? CO2 26 22 - 32 mmol/L  ? Glucose, Bld 116 (H) 70 - 99 mg/dL  ?  Comment: Glucose reference range applies only to samples taken after fasting for at least 8 hours.  ? BUN 48 (H) 8 - 23 mg/dL  ?  Creatinine, Ser 2.58 (H) 0.44 - 1.00 mg/dL  ? Calcium 9.2 8.9 - 10.3 mg/dL  ? Total Protein 6.7 6.5 - 8.1 g/dL  ? Albumin 3.6 3.5 - 5.0 g/dL  ? AST 18 15 - 41 U/L  ? ALT 13 0 - 44 U/L  ? Alkaline Phosphatase 90 38 - 126 U/L  ? Total Bilirubin 0.8 0.3 - 1.2 mg/dL  ? GFR, Estimated 20 (L) >60 mL/min  ?  Comment: (NOTE) ?Calculated using the CKD-EPI Creatinine Equation (2021) ?  ? Anion gap 4 (L) 5 - 15  ?  Comment: Performed at [facility], [address] [place], [place] [address]  ?Troponin I (High Sensitivity)     Status: None  ? Collection Time: 10/04/21  4:28 PM  ?Result Value Ref Range  ? Troponin I (High Sensitivity) 16 <18 ng/L  ?  Comment: (NOTE) ?Elevated high sensitivity troponin I (hsTnI) values and significant  ?changes across serial measurements may suggest ACS but many other  ?chronic and acute conditions are known to elevate hsTnI results.  ?Refer to the "Links" section for chest pain algorithms and additional  ?guidance. ?Performed at [facility], [address] [place], [place] ?[address] ?  ?Magnesium     Status: None  ? Collection Time: 10/04/21  4:28 PM  ?Result Value Ref Range  ? Magnesium 2.1 1.7 - 2.4 mg/dL  ?  Comment: Performed at [facility], [address] [place], [place] [address]  ?Ethanol     Status: None  ? Collection Time: 10/04/21  5:09 PM  ?Result Value Ref Range  ? Alcohol, Ethyl (B) <10 <10 mg/dL  ?  Comment: (NOTE) ?Lowest detectable limit  for serum alcohol is 10 mg/dL. ? ?For medical purposes only. ?Performed at [facility], [address] [place], [place] ?[address] ?  ? ?DG Chest Portable 1 View ? ?Result Date: 10/04/2021 ?CLINICAL DAT

## 2021-10-04 NOTE — H&P (Addendum)
Family Medicine Teaching Service ?Hospital Admission History and Physical ?Service Pager: [phone] ? ?Patient name: [name] Medical record number: [id number] ?Date of birth: 08-20-1955 Age: 66 y.o. Gender: female ? ?Primary Care Provider: Courtney ParisMcCoy, Rachel, NP ?Consultants: None ?Code Status: Full ?Preferred Emergency Contact: [name] Daughter [phone] ? ?Chief Complaint: Loss of consciousness earlier today ? ?Assessment and Plan: ?[name] is a 66 y.o. female presenting with loss of consciousness earlier today concerning for syncope and AKI. PMH is significant for T2DM, obesity, mood disorder, constipation, HLD, asthma, chronic back pain, insomnia ? ?Syncope ?Patient being admitted for work-up of syncope today. In the ED vitals have been appropriate with intermittent bradycardia due to irregular rhythm on monitor, saturating well on room air and afebrile.  CBC within normal limits.  Potassium elevated to 5.2, significant AKI to 2.58, no hypoglycemia, alcohol level less than 10.  Troponins trended flat 16 >14. EKG showed ventricular bigeminy, chest x-ray with borderline cardiac enlargement, and DVT ultrasound that was negative in lower extremity.  She was given a 500 mL bolus in the ED. She was disoriented per EMS initially and had blood pressures with systolics in the 90s.  On examination today patient was alert and oriented x4 with some difficulty raising her left leg (been occurring for 5 months) but otherwise neurologically intact. Differential includes most likely AKI from poor PO intake that precipitated increased effects of sedating medications she is prescribed (oxycodone, baclofen, oxycontin etc). Patient says she took 1 oxycodone, 1 OxyContin ER pill and 2 baclofen pills in the morning but also said she took medications before driving.  Per PDMP review has a 85.5 MME per day average. Patient only has 1 standard size bottle of water today and oral mucosa was very dry with chapped lips  alongside AKI. Cardiac etiology possibility as well given patient has ventricular bigeminy on EKG and history of loss of consciousness and some dizziness prior to the event however patient remained disoriented in EMS after the event for a while. Seizure possible however less likely given patient did not have any incontinence during the event, any shaking witnessed or any tongue lacerations on examination.  Pulmonary embolism possibility as well given patient had left lower extremity swelling/tenderness to palpation by knee however patient has not been tachycardic or hypoxic and DVT ultrasound was negative.   ?-Admit to [facility], attending Dr. Pollie MeyerMcIntyre, med telemetry ?-Fall precautions ?-Cardiac monitoring ?-Keep oxygen saturations greater than 92% ?-Vitals per floor protocol ?-Orthostatic vitals ?-Follow-up UDS ?-Restart oxycodone at a lower dose to prevent withdrawal ?-AM CBC, BMP, TSH ?-DVT prophylaxis Lovenox ?-Diet heart healthy/carb modified ? ?AKI, likely pre-renal ?Significant AKI to 2.58 creatinine with baseline creatinine around 0.7.  Received one 500 mL LR bolus in ED.  Denies any issues with urinating currently.  Does have very chapped lips and dry mucous membranes on examination.Believe she is likely intravascularly depleted.  Says she only drank one third of what she usually drinks today. Drinks about 3 16 oz bottles of water daily but yesterday only drank 16 oz.  ?-Monitor on BMP ?-500 mL NS bolus x 1 ?-NS IVF @ 75 ml/hr ?-Encourage p.o. intake ?-Strict I's and O's ?-Avoid nephrotoxic agents ? ?Left lower extremity swelling ?Patient had swelling on left lower extremity with tenderness in popliteal fossa, mild erythema on anterior thigh as well as some warmth in comparison to the right lower extremity.  No distinct lines or wounds noted.  Patient has issue lifting left leg higher than a few  inches off the bed which she says has been going on for around 5 months but has been getting worse.  Does have a  history of a knee replacement in both legs with the surgery in the left leg being 5 months ago.  DVT ultrasound was negative.  Less likely to be cellulitis given patient does not have any distinct borders to erythema and no fevers. ?-Apply ice to affected area ?-Elevate leg ?-Apply compression ?-PT eval ? ?Hyperkalemia ?Potassium of 5.2, no peaked T waves on EKG. ?-Monitor on BMP ?-Cardiac monitoring ? ?Ventricular bigeminy ?EKG and cardiac monitoring showed ventricular bigeminy. Patient denies feeling any palpitations but says she does have a known history of "skipping a beat."  On examination patient has irregular heartbeat. ?-Cardiac monitoring ?-Consider cardiology consult ?-Consider zio patch outpatient ? ?Polypharmacy ?Patient is on many sedating medications/Beer's list drugs per PDMP and fill dates. Home medications of flexeril 5 mg TID prn, baclofen 10 mg TID, robaxin 500 mg, gabapentin 800 mg TID, oxycodone-acetaminophen 10-325 , Oxycontin ER 15 mg BID, glipizide 5 mg daily, amitriptyline and Ambien 5 mg daily.  ?-Consider reducing medications at discharge given syncopal event ? ?Chronic Pain  knee and back pain ?Home medications of flexeril 5 mg TID prn, baclofen 10 mg TID, robaxin 500 mg, gabapentin 800 mg TID, oxycodone-acetaminophen 10-325 , Oxycontin ER 15 mgBID, buprenorphine 20 mcg/hr patch weekly, lidocaine patches. PDMP confirms with recent fill dates of opioids. Given AKI will reduce doses and also monitor with COWS.  Discussed this regimen with on-call pharmacist. ?-10 mg OxyContin ER BID ?-Oxycodone-percocet 5 q6h prn ?-Buprenorphine two 10 mg patches ?-Narcan PRN ?-Hold flexeril, gabapentin, baclofen, robaxin, lidocaine patches in setting of AKI ?-COWS ? ?T2DM ?Home medications of Ozempic weekly, 24 units insulin detemir nightly (patient unsure of how much she takes at home), Janumet 50-1000 mg twice daily (unsure if she takes this), glipizide 5 mg daily, and lisinopril 2.5 mg daily.  Last A1c  of 8.2 11 months ago. ?-sSSI  ?-monitor CBGs ?-A1c  ?-Hold home medications ?-Carb modified diet ? ?Obesity ?Home medications of Ozempic weekly on Saturdays.  Patient says she takes her Ozempic daily although per fill date not likely.  ?-Hold Ozempic in hospital ? ?Mood disorder ?Home medications of Abilify 5 mg daily, sertraline 100 mg nightly, and amitriptyline 10 mg nightly. ?-continue home abilify and sertraline ?-hold home amitriptyline given bigeminy on EKG ? ?Constipation ?Home medication of Colace twice daily as needed and MiraLAX daily as needed. Denies any constipation currently.  Has not had a bowel movement in 2 days. ?-MiraLAX 17 mg daily ?-Senna daily ? ?HLD  Hx of MI 2019 ?Home medication of fenofibrate 54 mg daily and pravastatin 80 mg daily and aspirin 81 mg daily.  Has had a history of MI in 2019.  Last lipid panel 10 years ago with cholesterol of 228, triglycerides of 543. ?-Obtain lipid panel ?-Consider adding high intensity statin given history ?-Continue aspirin 81 mg ? ?Asthma ?Home medication of albuterol 2 puffs q4h prn and advair 115-21 daily. ?-Continue home advair ?-Add back albuterol if needed ? ?Insomnia ?Home medication of Ambien 5 mg daily prn. ?-Hold ambien  ? ?FEN/GI: Heart healthy/carb modified ?Prophylaxis: Lovenox ? ?Disposition: Med-telemetry ? ?History of Present Illness:  GABRELLA STROH is a 66 y.o. female presenting with  ?Patient said that she went to the school to pick up her grandkids.  She parked her car and the car pulled and then felt very hot in the  car and slightly dizzy.  She then passed out. Denies any chest pain or shortness of breath at that time.  Someone witnessed the event and called EMS.  She says she was taken to the nurses office and was helped to walk over.  In the nurses office she was able to cool down and she said her temp was in the 90s. Said afterwards she was slurring her words. She felt confused but didn't notice her speech was different.  She says  she did not hit head today.  ? ?She says she usually drinks 3 16 oz water bottles. Today she drank just 1 16 oz water bottle.  She says she took her pain medications of oxycodone, OxyContin and 2 baclofen in the mor

## 2021-10-04 NOTE — ED Triage Notes (Signed)
Pt BIB [facility] from the school c/o a syncopal episode. Pt was in the car rider line waiting to pick up here grand kids when she had a syncopal episode. Another bystander saw it happen and called EMS. When EMS arrived pt was disoriented. Pt received 500 NS. Pt became alert and oriented x4 enroute. Pt c/o of some dizziness. Per EMS initially BP was in the 90's. Pt denies any pain.   ?

## 2021-10-04 NOTE — Progress Notes (Signed)
Lower extremity venous left study completed. ? ?Preliminary results relayed to Long, MD and Royce Macadamia, MD via secure chat. ? ?See CV Proc for preliminary results report.  ? ?Darlin Coco, RDMS, RVT ? ?

## 2021-10-04 NOTE — Progress Notes (Signed)
Family medicine teaching service will be admitting this patient. Our pager information can be located in the physician sticky notes, treatment team sticky notes, and the headers of all our official daily progress notes.   FAMILY MEDICINE TEACHING SERVICE Patient - Please contact intern pager [phone] or text page via website [email or web address] (login: [email or web address]) for questions regarding care. DO NOT page listed attending provider unless there is no answer from the number above.   Jasmine Santellan, MD PGY-2, [facility] pager [phone]   

## 2021-10-04 NOTE — ED Provider Notes (Signed)
?[facility] ?Provider Note ? ? ?CSN: [id number] ?Arrival date & time: 10/04/21  1555 ? ?  ? ?History ? ?Chief Complaint  ?Patient presents with  ? Loss of Consciousness  ? ? ? ? ?Loss of Consciousness ?Associated symptoms: no chest pain, no dizziness, no fever, no nausea, no palpitations, no seizures, no shortness of breath and no vomiting   ? ?[name] is a 66 y.o. female with history of DM, asthma, CAD, cervical spinal stenosis presenting to the ED after syncopal episode.  Patient states that she was waiting in line to pick up her grandkids from school when she suddenly lost consciousness.  She did not have any prodromal symptoms including no chest pain, shortness of breath, lightheadedness, or dizziness prior to the episode.  She states that she woke up to someone shaking her awake in the carpal line.  She has never had anything like this happen before.  Currently, she has no complaints in the emergency department.  She states that she has been feeling well over the past few days though has had some mild wheezing consistent with her known asthma.  She has not used her albuterol today.  Denies any recent fevers, cough, abdominal pain, nausea/vomiting/diarrhea, changes in appetite.  She states that her heart does seem to skip a beat occasionally, but she does not really notice it.  Denies any palpitations. ?  ? ?Home Medications ?Prior to Admission medications   ?Medication Sig Start Date End Date Taking? Authorizing Provider  ?acetaminophen (TYLENOL) 325 MG tablet Take 1-2 tablets (325-650 mg total) by mouth every 6 (six) hours as needed for mild pain (pain score 1-3 or temp > 100.5). 06/09/20   Meredith Pel, MD  ?albuterol (PROVENTIL HFA;VENTOLIN HFA) 108 (90 Base) MCG/ACT inhaler Inhale 2 puffs into the lungs every 4 (four) hours as needed for wheezing or shortness of breath. For shortness of breath 01/28/17   Jearld Fenton, NP  ?albuterol (PROVENTIL) (2.5 MG/3ML)  0.083% nebulizer solution Take 2.5 mg by nebulization every 6 (six) hours as needed for wheezing or shortness of breath.    [provider]  ?amitriptyline (ELAVIL) 10 MG tablet Take 10 mg by mouth at bedtime.    [provider]  ?ARIPiprazole (ABILIFY) 5 MG tablet Take 5 mg by mouth daily.    [provider]  ?aspirin EC 81 MG tablet Take 1 tablet (81 mg total) by mouth daily. 07/14/17   Filippou, Braxton Feathers, MD  ?blood glucose meter kit and supplies Dispense based on patient and insurance preference. Use up to four times daily as directed. (FOR ICD-10 E10.9, E11.9). 09/26/17   Elby Beck, FNP  ?cyclobenzaprine (FLEXERIL) 5 MG tablet TAKE 1 TABLET BY MOUTH THREE TIMES A DAY AS NEEDED FOR MUSCLE SPASMS 12/04/20   Jessy Oto, MD  ?docusate sodium (COLACE) 100 MG capsule Take 1 capsule (100 mg total) by mouth 2 (two) times daily as needed for mild constipation. 10/26/20   Rai, Vernelle Emerald, MD  ?fenofibrate 54 MG tablet Take 54 mg by mouth daily. 10/08/20   [provider]  ?insulin detemir (LEVEMIR FLEXTOUCH) 100 UNIT/ML FlexPen Inject 24 Units into the skin at bedtime.    [provider]  ?JANUMET 50-1000 MG tablet Take 1 tablet by mouth 2 (two) times daily. 07/19/20   [provider]  ?Magnesium 500 MG TABS Take 500 mg by mouth at bedtime.    [provider]  ?methocarbamol (ROBAXIN) 500  MG tablet Take 1 tablet (500 mg total) by mouth every 6 (six) hours as needed for muscle spasms. 10/26/20   Rai, Vernelle Emerald, MD  ?Multiple Vitamins-Minerals (ADVANCED DIABETIC MULTIVITAMIN PO) Take 1 tablet by mouth daily.    [provider]  ?OZEMPIC, 1 MG/DOSE, 4 MG/3ML SOPN Inject 1 mg into the muscle every Saturday. 03/26/20   [provider]  ?polyethylene glycol (MIRALAX / GLYCOLAX) 17 g packet Take 17 g by mouth daily as needed for mild constipation. 10/26/20   Rai, Vernelle Emerald, MD  ?pravastatin (PRAVACHOL) 80 MG tablet Take 80 mg by mouth daily.  10/08/20   [provider]  ?sertraline (ZOLOFT) 100 MG tablet Take 100 mg by mouth at bedtime.    [provider]  ?vitamin B-12 (CYANOCOBALAMIN) 1000 MCG tablet Take 1,000 mcg by mouth daily.    [provider]  ?zolpidem (AMBIEN) 5 MG tablet Take 5 mg by mouth at bedtime as needed for sleep. 08/01/16   [provider]  ?   ? ?Allergies    ?Patient has no known allergies.   ? ?Review of Systems   ?Review of Systems  ?Constitutional:  Negative for fever.  ?Respiratory:  Negative for cough and shortness of breath.   ?Cardiovascular:  Positive for leg swelling (LLE) and syncope. Negative for chest pain and palpitations.  ?Gastrointestinal:  Negative for abdominal pain, diarrhea, nausea and vomiting.  ?Genitourinary:  Negative for dysuria.  ?Skin:  Negative for rash.  ?Neurological:  Positive for syncope. Negative for dizziness, seizures and light-headedness.  ? ?Physical Exam ?Updated Vital Signs ?BP 140/85   Pulse 86   Temp 98.4 ?F (36.9 ?C) (Oral)   Resp 12   Ht 5' 2" (1.575 m)   Wt 73.9 kg   SpO2 99%   BMI 29.81 kg/m?  ?Physical Exam ?Constitutional:   ?   General: She is not in acute distress. ?   Appearance: She is obese. She is not toxic-appearing or diaphoretic.  ?HENT:  ?   Head: Normocephalic and atraumatic.  ?   Right Ear: External ear normal.  ?   Left Ear: External ear normal.  ?   Nose: Nose normal.  ?   Mouth/Throat:  ?   Mouth: Mucous membranes are dry.  ?   Pharynx: Oropharynx is clear.  ?Eyes:  ?   General: No scleral icterus. ?   Extraocular Movements: Extraocular movements intact.  ?   Pupils: Pupils are equal, round, and reactive to light.  ?Cardiovascular:  ?   Rate and Rhythm: Normal rate. Rhythm irregular.  ?   Heart sounds: Murmur (intermittent systolic murmur) heard.  ?  No friction rub. No gallop.  ?Pulmonary:  ?   Effort: Pulmonary effort is normal. No respiratory distress.  ?   Breath sounds: Normal breath sounds. No stridor. No wheezing, rhonchi or  rales.  ?Abdominal:  ?   General: There is no distension.  ?   Palpations: Abdomen is soft.  ?   Tenderness: There is no abdominal tenderness. There is no guarding or rebound.  ?Musculoskeletal:     ?   General: No deformity.  ?   Cervical back: Neck supple.  ?   Right lower leg: No edema.  ?   Left lower leg: Edema present.  ?Skin: ?   General: Skin is warm and dry.  ?Neurological:  ?   General: No focal deficit present.  ?   Mental Status: She is alert and oriented to person,  place, and time.  ? ? ?ED Results / Procedures / Treatments   ?Labs ?(all labs ordered are listed, but only abnormal results are displayed) ?Labs Reviewed  ?CBC WITH DIFFERENTIAL/PLATELET - Abnormal; Notable for the following components:  ?    Result Value  ? Hemoglobin 11.3 (*)   ? All other components within normal limits  ?COMPREHENSIVE METABOLIC PANEL - Abnormal; Notable for the following components:  ? Potassium 5.2 (*)   ? Glucose, Bld 116 (*)   ? BUN 48 (*)   ? Creatinine, Ser 2.58 (*)   ? GFR, Estimated 20 (*)   ? Anion gap 4 (*)   ? All other components within normal limits  ?MAGNESIUM  ?ETHANOL  ?RAPID URINE DRUG SCREEN, HOSP PERFORMED  ?URINALYSIS, ROUTINE W REFLEX MICROSCOPIC  ?TROPONIN I (HIGH SENSITIVITY)  ?TROPONIN I (HIGH SENSITIVITY)  ? ? ?EKG ?EKG Interpretation ? ?Date/Time:  Thursday Oct 04 2021 16:04:50 EDT ?Ventricular Rate:  95 ?PR Interval:  58 ?QRS Duration: 99 ?QT Interval:  367 ?QTC Calculation: 402 ?R Axis:   6 ?Text Interpretation: Sinus rhythm Ventricular bigeminy Short PR interval Anterior infarct, old Abnormal T, consider ischemia, lateral leads Similar to May 2022 tracing Confirmed by Nanda Quinton [phone]) on 10/04/2021 4:08:03 PM ? ?Radiology ?DG Chest Portable 1 View ? ?Result Date: 10/04/2021 ?CLINICAL DATA:  Syncope EXAM: PORTABLE CHEST 1 VIEW COMPARISON:  10/14/2020 FINDINGS: Hardware in the cervical spine. Borderline cardiomegaly. No focal opacity, pleural effusion, or pneumothorax. IMPRESSION: No active  disease.  Borderline cardiac enlargement Electronically Signed   By: Donavan Foil M.D.   On: 10/04/2021 17:28  ? ?VAS [place] LOWER EXTREMITY VENOUS (DVT) (7a-7p) ? ?Result Date: 10/04/2021 ? Lower Venous DVT Study P

## 2021-10-05 ENCOUNTER — Inpatient Hospital Stay: Payer: Medicare Other

## 2021-10-05 ENCOUNTER — Inpatient Hospital Stay (HOSPITAL_COMMUNITY): Payer: Medicare Other

## 2021-10-05 ENCOUNTER — Other Ambulatory Visit: Payer: Self-pay | Admitting: Student

## 2021-10-05 DIAGNOSIS — M549 Dorsalgia, unspecified: Secondary | ICD-10-CM | POA: Diagnosis present

## 2021-10-05 DIAGNOSIS — R008 Other abnormalities of heart beat: Secondary | ICD-10-CM | POA: Diagnosis present

## 2021-10-05 DIAGNOSIS — Y92219 Unspecified school as the place of occurrence of the external cause: Secondary | ICD-10-CM | POA: Diagnosis not present

## 2021-10-05 DIAGNOSIS — R55 Syncope and collapse: Principal | ICD-10-CM

## 2021-10-05 DIAGNOSIS — J45909 Unspecified asthma, uncomplicated: Secondary | ICD-10-CM | POA: Diagnosis present

## 2021-10-05 DIAGNOSIS — G8929 Other chronic pain: Secondary | ICD-10-CM | POA: Diagnosis present

## 2021-10-05 DIAGNOSIS — M7989 Other specified soft tissue disorders: Secondary | ICD-10-CM | POA: Diagnosis present

## 2021-10-05 DIAGNOSIS — K5900 Constipation, unspecified: Secondary | ICD-10-CM | POA: Diagnosis present

## 2021-10-05 DIAGNOSIS — Z6829 Body mass index (BMI) 29.0-29.9, adult: Secondary | ICD-10-CM | POA: Diagnosis not present

## 2021-10-05 DIAGNOSIS — F32A Depression, unspecified: Secondary | ICD-10-CM | POA: Diagnosis present

## 2021-10-05 DIAGNOSIS — G4700 Insomnia, unspecified: Secondary | ICD-10-CM | POA: Diagnosis present

## 2021-10-05 DIAGNOSIS — Z79899 Other long term (current) drug therapy: Secondary | ICD-10-CM | POA: Diagnosis not present

## 2021-10-05 DIAGNOSIS — I493 Ventricular premature depolarization: Secondary | ICD-10-CM

## 2021-10-05 DIAGNOSIS — E785 Hyperlipidemia, unspecified: Secondary | ICD-10-CM | POA: Diagnosis present

## 2021-10-05 DIAGNOSIS — E875 Hyperkalemia: Secondary | ICD-10-CM | POA: Diagnosis present

## 2021-10-05 DIAGNOSIS — E669 Obesity, unspecified: Secondary | ICD-10-CM | POA: Diagnosis present

## 2021-10-05 DIAGNOSIS — Z7982 Long term (current) use of aspirin: Secondary | ICD-10-CM | POA: Diagnosis not present

## 2021-10-05 DIAGNOSIS — N179 Acute kidney failure, unspecified: Secondary | ICD-10-CM | POA: Diagnosis present

## 2021-10-05 DIAGNOSIS — I252 Old myocardial infarction: Secondary | ICD-10-CM | POA: Diagnosis not present

## 2021-10-05 DIAGNOSIS — Z7984 Long term (current) use of oral hypoglycemic drugs: Secondary | ICD-10-CM | POA: Diagnosis not present

## 2021-10-05 DIAGNOSIS — Z794 Long term (current) use of insulin: Secondary | ICD-10-CM | POA: Diagnosis not present

## 2021-10-05 DIAGNOSIS — I700 Atherosclerosis of aorta: Secondary | ICD-10-CM | POA: Diagnosis present

## 2021-10-05 DIAGNOSIS — F39 Unspecified mood [affective] disorder: Secondary | ICD-10-CM | POA: Diagnosis present

## 2021-10-05 DIAGNOSIS — T50995A Adverse effect of other drugs, medicaments and biological substances, initial encounter: Secondary | ICD-10-CM | POA: Diagnosis present

## 2021-10-05 DIAGNOSIS — E11649 Type 2 diabetes mellitus with hypoglycemia without coma: Secondary | ICD-10-CM | POA: Diagnosis present

## 2021-10-05 DIAGNOSIS — I472 Ventricular tachycardia, unspecified: Secondary | ICD-10-CM | POA: Diagnosis present

## 2021-10-05 LAB — BASIC METABOLIC PANEL
Anion gap: 5 (ref 5–15)
BUN: 38 mg/dL — ABNORMAL HIGH (ref 8–23)
CO2: 25 mmol/L (ref 22–32)
Calcium: 9 mg/dL (ref 8.9–10.3)
Chloride: 107 mmol/L (ref 98–111)
Creatinine, Ser: 1.63 mg/dL — ABNORMAL HIGH (ref 0.44–1.00)
GFR, Estimated: 35 mL/min — ABNORMAL LOW (ref >60)
Glucose, Bld: 130 mg/dL — ABNORMAL HIGH (ref 70–99)
Potassium: 4.6 mmol/L (ref 3.5–5.1)
Sodium: 137 mmol/L (ref 135–145)

## 2021-10-05 LAB — CBC
HCT: 35.6 % — ABNORMAL LOW (ref 36.0–46.0)
Hemoglobin: 10.7 g/dL — ABNORMAL LOW (ref 12.0–15.0)
MCH: 26.9 pg (ref 26.0–34.0)
MCHC: 30.1 g/dL (ref 30.0–36.0)
MCV: 89.4 fL (ref 80.0–100.0)
Platelets: 184 10*3/uL (ref 150–400)
RBC: 3.98 MIL/uL (ref 3.87–5.11)
RDW: 14.3 % (ref 11.5–15.5)
WBC: 6.5 10*3/uL (ref 4.0–10.5)
nRBC: 0 % (ref 0.0–0.2)

## 2021-10-05 LAB — HEMOGLOBIN A1C
Hgb A1c MFr Bld: 6.6 % — ABNORMAL HIGH (ref 4.8–5.6)
Mean Plasma Glucose: 142.72 mg/dL

## 2021-10-05 LAB — URINALYSIS, ROUTINE W REFLEX MICROSCOPIC
Bilirubin Urine: NEGATIVE
Glucose, UA: NEGATIVE mg/dL
Hgb urine dipstick: NEGATIVE
Ketones, ur: NEGATIVE mg/dL
Nitrite: NEGATIVE
Protein, ur: NEGATIVE mg/dL
Specific Gravity, Urine: 1.014 (ref 1.005–1.030)
pH: 5 (ref 5.0–8.0)

## 2021-10-05 LAB — GLUCOSE, CAPILLARY
Glucose-Capillary: 113 mg/dL — ABNORMAL HIGH (ref 70–99)
Glucose-Capillary: 98 mg/dL (ref 70–99)
Glucose-Capillary: 140 mg/dL — ABNORMAL HIGH (ref 70–99)
Glucose-Capillary: 119 mg/dL — ABNORMAL HIGH (ref 70–99)
Glucose-Capillary: 123 mg/dL — ABNORMAL HIGH (ref 70–99)
Glucose-Capillary: 122 mg/dL — ABNORMAL HIGH (ref 70–99)

## 2021-10-05 LAB — RAPID URINE DRUG SCREEN, HOSP PERFORMED
Amphetamines: NOT DETECTED
Barbiturates: NOT DETECTED
Benzodiazepines: NOT DETECTED
Cocaine: NOT DETECTED
Opiates: POSITIVE — AB
Tetrahydrocannabinol: NOT DETECTED

## 2021-10-05 LAB — LIPID PANEL
Cholesterol: 180 mg/dL (ref 0–200)
HDL: 34 mg/dL — ABNORMAL LOW (ref >40)
LDL Cholesterol: 91 mg/dL (ref 0–99)
Total CHOL/HDL Ratio: 5.3 RATIO
Triglycerides: 276 mg/dL — ABNORMAL HIGH (ref <150)
VLDL: 55 mg/dL — ABNORMAL HIGH (ref 0–40)

## 2021-10-05 LAB — TSH: TSH: 1.086 u[IU]/mL (ref 0.350–4.500)

## 2021-10-05 LAB — ECHOCARDIOGRAM COMPLETE
AR max vel: 2.01 cm2
AV Area VTI: 1.77 cm2
AV Area mean vel: 1.91 cm2
AV Mean grad: 5 mmHg
AV Peak grad: 8.4 mmHg
Ao pk vel: 1.45 m/s
Area-P 1/2: 5.5 cm2
Height: 62 in
S' Lateral: 3.4 cm
Weight: 2608 oz

## 2021-10-05 MED ORDER — NALOXONE HCL 0.4 MG/ML IJ SOLN: 0.4000 mg | INTRAMUSCULAR | Status: DC | PRN

## 2021-10-05 MED ORDER — ACETAMINOPHEN 325 MG PO TABS
650.0000 mg | ORAL_TABLET | Freq: Four times a day (QID) | ORAL | Status: DC
Start: 2021-10-05 — End: 2021-10-07
  Administered 2021-10-05 – 2021-10-07 (×9): 650 mg via ORAL
  Filled 2021-10-05 (×9): qty 2

## 2021-10-05 MED ORDER — BUPRENORPHINE 10 MCG/HR TD PTWK
2.0000 | MEDICATED_PATCH | TRANSDERMAL | Status: DC
  Administered 2021-10-05: 2 via TRANSDERMAL
  Filled 2021-10-05: qty 2

## 2021-10-05 MED ORDER — POLYETHYLENE GLYCOL 3350 17 G PO PACK
17.0000 g | PACK | Freq: Every day | ORAL | Status: DC
  Administered 2021-10-05 – 2021-10-07 (×2): 17 g via ORAL
  Filled 2021-10-05 (×3): qty 1

## 2021-10-05 MED ORDER — SERTRALINE HCL 100 MG PO TABS
200.0000 mg | ORAL_TABLET | Freq: Every day | ORAL | Status: DC
  Administered 2021-10-05 – 2021-10-06 (×2): 200 mg via ORAL
  Filled 2021-10-05 (×2): qty 2

## 2021-10-05 MED ORDER — SENNOSIDES-DOCUSATE SODIUM 8.6-50 MG PO TABS
1.0000 | ORAL_TABLET | Freq: Every day | ORAL | Status: DC
  Administered 2021-10-05 – 2021-10-07 (×2): 1 via ORAL
  Filled 2021-10-05 (×3): qty 1

## 2021-10-05 MED ORDER — ASPIRIN EC 81 MG PO TBEC
81.0000 mg | DELAYED_RELEASE_TABLET | Freq: Every day | ORAL | Status: DC
Start: 2021-10-05 — End: 2021-10-05

## 2021-10-05 MED ORDER — ARIPIPRAZOLE 10 MG PO TABS
5.0000 mg | ORAL_TABLET | Freq: Every day | ORAL | Status: DC
Start: 2021-10-05 — End: 2021-10-08
  Administered 2021-10-05 – 2021-10-07 (×3): 5 mg via ORAL
  Filled 2021-10-05 (×3): qty 1

## 2021-10-05 MED ORDER — OXYCODONE HCL ER 10 MG PO T12A
10.0000 mg | EXTENDED_RELEASE_TABLET | Freq: Two times a day (BID) | ORAL | Status: DC
  Administered 2021-10-05 – 2021-10-07 (×6): 10 mg via ORAL
  Filled 2021-10-05 (×6): qty 1

## 2021-10-05 MED ORDER — BACLOFEN 10 MG PO TABS: 5.0000 mg | ORAL_TABLET | Freq: Three times a day (TID) | ORAL | Status: DC | PRN

## 2021-10-05 MED ORDER — SERTRALINE HCL 100 MG PO TABS
100.0000 mg | ORAL_TABLET | Freq: Every day | ORAL | Status: DC
  Administered 2021-10-05: 100 mg via ORAL
  Filled 2021-10-05: qty 1

## 2021-10-05 MED ORDER — SODIUM CHLORIDE 0.9 % IV SOLN: INTRAVENOUS | Status: DC

## 2021-10-05 MED ORDER — OXYCODONE-ACETAMINOPHEN 5-325 MG PO TABS: 1.0000 | ORAL_TABLET | Freq: Four times a day (QID) | ORAL | Status: DC | PRN

## 2021-10-05 MED ORDER — FLUTICASONE FUROATE-VILANTEROL 200-25 MCG/ACT IN AEPB
1.0000 | INHALATION_SPRAY | Freq: Every day | RESPIRATORY_TRACT | Status: DC
  Administered 2021-10-05 – 2021-10-07 (×3): 1 via RESPIRATORY_TRACT
  Filled 2021-10-05: qty 28

## 2021-10-05 NOTE — Progress Notes (Signed)
FPTS Brief Progress Note ? ?S: Went to see patient and currently not in any distress.  Says that she has been able to drink more water.  Overall feeling better ? ?O: ?BP (!) 156/86 (BP Location: Right Arm)   Pulse 88   Temp 97.9 ?F (36.6 ?C) (Oral)   Resp 19   Ht 5\' 2"  (1.575 m)   Wt 73.9 kg   SpO2 95%   BMI 29.81 kg/m?   ?Gen: NAD, alert and responsive to all questions, lips no longer chapped, no mucous membranes moist ?Back: one 10 mcg buprenorphine patch noted on back ? ?A/P: ?- Orders reviewed. Labs for AM ordered, which was adjusted as needed.  ?Attempted to call daughter again, no response noted ? ? , MD ?10/05/2021, 8:20 PM ?PGY-1, [place] [facility] Night Resident  ?Please page [phone] with questions.  ?  ?

## 2021-10-05 NOTE — Evaluation (Signed)
Physical Therapy Evaluation ?Patient Details ?Name: [name] ?MRN: [id number] ?DOB: 1956-01-26 ?Today's Date: 10/05/2021 ? ?History of Present Illness ? Pt is 66 yr old F admitted on 10/04/21 due to syncopal episode.  PMH: anxiety, arthritis, asthma, depression, MI, DM  ?Clinical Impression ? Pt was previously mod I with functional mobility, utilizing quad cane for community amb and managing around home without any AD.  Currently, pt is able to amb short distances in room with CGA for safety and use of IV pole for support.  Pt demos dec balance, LE weakness and gait deviations and would benefit from skilled PT in acute care to address deficits, trial RW and practice stair negotiation.  Pt feels comfortable returning home upon D/C with HHPT, RW and assist from daughter as needed.  PT to f/u tomorrow to practice amb with RW to ensure safety with plan as pt's lunch tray arrives during evaluation and pt is anxious to eat.   ?   ? ?Recommendations for follow up therapy are one component of a multi-disciplinary discharge planning process, led by the attending physician.  Recommendations may be updated based on patient status, additional functional criteria and insurance authorization. ? ?Follow Up Recommendations Home health PT (Pt feels comfortable to return home with assist from daughter.) ? ?  ?Assistance Recommended at Discharge Intermittent Supervision/Assistance  ?Patient can return home with the following ? A little help with walking and/or transfers;Assistance with cooking/housework;Help with stairs or ramp for entrance ? ?  ?Equipment Recommendations Rolling walker (2 wheels)  ?Recommendations for Other Services ?    ?  ?Functional Status Assessment Patient has had a recent decline in their functional status and demonstrates the ability to make significant improvements in function in a reasonable and predictable amount of time.  ? ?  ?Precautions / Restrictions Precautions ?Precautions: None  ? ?  ? ?Mobility ?  Bed Mobility ?Overal bed mobility: Modified Independent ?  ?  ?  ?  ?  ?  ?General bed mobility comments: Sit up to EOB without difficulty.  Demos good sitting balance. ?Patient Response: Cooperative ? ?Transfers ?Overall transfer level: Modified independent ?Equipment used: None ?  ?  ?  ?  ?  ?  ?  ?General transfer comment: Performs sit > stand from EOB with mod I.  Demos fair standing balance. ?  ? ?Ambulation/Gait ?Ambulation/Gait assistance: Min guard ?Gait Distance (Feet): 25 Feet ?Assistive device: IV Pole ?Gait Pattern/deviations: Shuffle, Decreased step length - right, Decreased step length - left, Decreased stride length ?  ?  ?  ?General Gait Details: Pt amb in room with dec step length, inc postural sway and dec weightbearing through L LE.  PT discusses benefits of trialing RW to dec pain through L LE and pt is agreeable to trial next tx session. ? ?Stairs ?  ?  ?  ?  ?  ? ?Wheelchair Mobility ?  ? ?Modified Rankin (Stroke Patients Only) ?  ? ?  ? ?Balance Overall balance assessment: Mild deficits observed, not formally tested ?  ?  ?  ?  ?  ?  ?  ?  ?  ?  ?  ?  ?  ?  ?  ?  ?  ?  ?   ? ? ? ?Pertinent Vitals/Pain Pain Assessment ?Pain Assessment: 0-10 ?Pain Score: 0-No pain ?Pain Location: States no pain at rest but does state she has chronic L LE pain with new onset burning sensation in L LE ?Pain Descriptors /  Indicators: Burning, Discomfort ?Pain Intervention(s): Limited activity within patient's tolerance  ? ? ?Home Living Family/patient expects to be discharged to:: Private residence ?Living Arrangements: Children (3 grandchildren) ?Available Help at Discharge: Family;Available PRN/intermittently (Daughter works from home) ?Type of Home: Mobile home ?Home Access: Stairs to enter ?Entrance Stairs-Rails: Left ?Entrance Stairs-Number of Steps: 6 steps at front, 4 at back ?  ?Home Layout: One level ?Home Equipment: Cane - quad ?   ?  ?Prior Function Prior Level of Function : Independent/Modified  Independent ?  ?  ?  ?  ?  ?  ?Mobility Comments: States she does not use an AD in home (sometimes wall walks); uses quad cane for community ambulation ?  ?  ? ? ?Hand Dominance  ?   ? ?  ?Extremity/Trunk Assessment  ? Upper Extremity Assessment ?Upper Extremity Assessment: Overall WFL for tasks assessed ?  ? ?Lower Extremity Assessment ?Lower Extremity Assessment: LLE deficits/detail ?LLE Deficits / Details: Grossly 3-/5 ?  ? ?   ?Communication  ? Communication: No difficulties  ?Cognition Arousal/Alertness: Awake/alert ?Behavior During Therapy: [facility] for tasks assessed/performed ?Overall Cognitive Status: Within Functional Limits for tasks assessed ?  ?  ?  ?  ?  ?  ?  ?  ?  ?  ?  ?  ?  ?  ?  ?  ?General Comments: Pt is supine in bed when PT arrives.  Alert and oriented x 3.  Lunch tray arrives at beginning of evaluation, but pt is agreeable to get OOB and ambulate to chair to eat sitting up. ?  ?  ? ?  ?General Comments   ? ?  ?Exercises    ? ?Assessment/Plan  ?  ?PT Assessment Patient needs continued PT services  ?PT Problem List Decreased strength;Decreased mobility;Decreased balance;Pain;Decreased activity tolerance ? ?   ?  ?PT Treatment Interventions DME instruction;Therapeutic exercise;Gait training;Balance training;Stair training;Functional mobility training;Patient/family education;Therapeutic activities   ? ?PT Goals (Current goals can be found in the Care Plan section)  ?Acute Rehab PT Goals ?Patient Stated Goal: Pt's goal is to decrease L LE pain ?PT Goal Formulation: With patient ?Time For Goal Achievement: 10/19/21 ?Potential to Achieve Goals: Good ? ?  ?Frequency Min 3X/week ?  ? ? ?Co-evaluation   ?  ?  ?  ?  ? ? ?  ?AM-PAC PT "6 Clicks" Mobility  ?Outcome Measure Help needed turning from your back to your side while in a flat bed without using bedrails?: None ?Help needed moving from lying on your back to sitting on the side of a flat bed without using bedrails?: None ?Help needed moving to and from  a bed to a chair (including a wheelchair)?: A Little ?Help needed standing up from a chair using your arms (e.g., wheelchair or bedside chair)?: None ?Help needed to walk in hospital room?: A Little ?Help needed climbing 3-5 steps with a railing? : A Little ?6 Click Score: 21 ? ?  ?End of Session Equipment Utilized During Treatment: Gait belt ?Activity Tolerance: Patient tolerated treatment well ?Patient left: in chair;with call bell/phone within reach;with chair alarm set ?Nurse Communication: Mobility status ?PT Visit Diagnosis: Other abnormalities of gait and mobility (R26.89) ?  ? ?Time: 0347-4259 ?PT Time Calculation (min) (ACUTE ONLY): 22 min ? ? ?Charges:   PT Evaluation ?$PT Eval Low Complexity: 1 Low ?  ?  ?   ? ? ?Tykera Skates A. Vandell Kun, PT, DPT ?Acute Rehabilitation Services ?Office: [phone]  ? ?Macintyre Alexa A Dolora Ridgely ?10/05/2021, 12:36 PM ? ?

## 2021-10-05 NOTE — Progress Notes (Signed)
Tried to call patient's daughter Tammie x3 for an update and to request her to bring in all medication bottles. Will try again later today. ? ?Orvis Brill, DO ?10/05/21 1:56 PM ?PGY-1, [place] ?[facility] pager: [phone], text pages welcome ?

## 2021-10-05 NOTE — Progress Notes (Signed)
?   10/05/21 1150  ?Clinical Encounter Type  ?Visited With Patient  ?Visit Type Initial;Other (Comment) ?(Advanced Directive)  ?Referral From Nurse  ?Consult/Referral To Chaplain  ? ?Chaplain responded to a spiritual care for advanced directive education.  I went over the forms to be filled out by the patient and the importance of her deciding on agents and how she wanted her care to be managed. The patient, [name] understood and stated that she wanted to read over the material. I encouraged her to take her time.   ? ?Danice Goltz  ?Chaplain  ?[facility]  ?[phone] ?

## 2021-10-05 NOTE — Progress Notes (Signed)
Ordered 2 week Zio monitor for further evaluation of questionable syncope and frequent PVCs. Please see consult note from today for further details. ? ?Corrin Parker, PA-C ?10/05/2021 2:39 PM ? ? ?

## 2021-10-05 NOTE — Progress Notes (Signed)
FPTS Brief Progress Note ? ?Spoke with daughter Deboraha Sprang says that she (daughter) usually takes the kids to school. Says that on day her mom was admitted she was sleeping until 10 AM. Stayed asleep until 10:30 pm and when she woke up her talking was a little off. She says she usually has 1x BM a day but around 1-2 times a week has very bad diarrhea. Says she is concerned about her medication list for the past year and has brought it up to her PCP as she has had falls before that were similar. She would like a new PCP for her mother before discharge. I let her know that we would set this up with social work before discharge. Other daughter asked about arrhythmia-I discussed that cardiology saw patient inpatient and recommended zio patch outpatient. All questions answered.  ? ?Levin Erp, MD ?10/05/2021, 9:12 PM ?PGY-1, [place] [facility] Night Resident  ?Please page [phone] with questions.  ?  ?

## 2021-10-05 NOTE — TOC Initial Note (Addendum)
Transition of Care (TOC) - Initial/Assessment Note  ? ? ?Patient Details  ?Name: [name] ?MRN: [id number] ?Date of Birth: Nov 22, 1955 ? ?Transition of Care (TOC) CM/SW Contact:    ?Kingsley Plan, RN ?Phone Number: ?10/05/2021, 1:00 PM ? ?Clinical Narrative:                 ?Confirmed face sheet information. Patient from home. Patient wanting to return to home with assist of daughter .  ? ?Patient had knee replacement 5 months ago, she did not keep walker. She states she private paid for walker. NCM ordered walker through [facility].  ? ?Patient called daughter she does have walker at home. Cancelled walker with [employer]  ? ?Patient had [facility] in past and would like them again. Clifton Custard with [facility] accepted referral. Message Dr Larita Fife for orders and face to face.  ? ?Consult for : Chart review appears to indicate her pain management NP is doing most of her primary care. Needs a PCP. ? ? ?Patient's PCP Courtney Paris does family medicine and pain management. PAtient does NOT want to change providers. Secure chatted Dr Larita Fife ? ?Expected Discharge Plan: Home w Home Health Services ?Barriers to Discharge: Continued Medical Work up ? ? ?Patient Goals and CMS Choice ?Patient states their goals for this hospitalization and ongoing recovery are:: to return home ?CMS Medicare.gov Compare Post Acute Care list provided to:: Patient ?Choice offered to / list presented to : Patient ? ?Expected Discharge Plan and Services ?Expected Discharge Plan: Home w Home Health Services ?  ?Discharge Planning Services: CM Consult ?Post Acute Care Choice: Home Health ?Living arrangements for the past 2 months: Single Family Home ?                ?  ?  ?Date DME Agency Contacted: 10/05/21 ?Time DME Agency Contacted: 1259 ?  ?HH Arranged: PT ?HH Agency: [facility] ?Date HH Agency Contacted: 10/05/21 ?Time HH Agency Contacted: 1259 ?Representative spoke with at [facility] Agency: Clifton Custard ? ?Prior Living  Arrangements/Services ?Living arrangements for the past 2 months: Single Family Home ?Lives with:: Adult Children ?Patient language and need for interpreter reviewed:: Yes ?Do you feel safe going back to the place where you live?: Yes      ?Need for Family Participation in Patient Care: Yes (Comment) ?Care giver support system in place?: Yes (comment) ?  ?Criminal Activity/Legal Involvement Pertinent to Current Situation/Hospitalization: No - Comment as needed ? ?Activities of Daily Living ?Home Assistive Devices/Equipment: Dan Humphreys (specify type) ?ADL Screening (condition at time of admission) ?Patient's cognitive ability adequate to safely complete daily activities?: Yes ?Is the patient deaf or have difficulty hearing?: No ?Does the patient have difficulty seeing, even when wearing glasses/contacts?: No ?Does the patient have difficulty concentrating, remembering, or making decisions?: Yes ?Patient able to express need for assistance with ADLs?: Yes ?Does the patient have difficulty dressing or bathing?: No ?Independently performs ADLs?: Yes (appropriate for developmental age) ?Does the patient have difficulty walking or climbing stairs?: Yes ?Weakness of Legs: Both ?Weakness of Arms/Hands: None ? ?Permission Sought/Granted ?  ?Permission granted to share information with : No ?   ?   ?   ?   ? ?Emotional Assessment ?Appearance:: Appears stated age ?Attitude/Demeanor/Rapport: Engaged ?Affect (typically observed): Accepting ?Orientation: : Oriented to Self, Oriented to Place, Oriented to  Time, Oriented to Situation ?Alcohol / Substance Use: Not Applicable ?Psych Involvement: No (comment) ? ?Admission diagnosis:  Syncope [R55] ?Syncope, unspecified syncope type [R55] ?  Patient Active Problem List  ? Diagnosis Date Noted  ? Syncope 10/04/2021  ? Cervical spondylosis with myelopathy 10/20/2020  ?  Class: Chronic  ? Cervical cord compression with myelopathy (HCC) 10/18/2020  ? Spinal stenosis, cervical region 10/18/2020   ? Polypharmacy 10/18/2020  ? CAD (coronary artery disease) 10/18/2020  ? Weakness 10/14/2020  ? Fall at home, initial encounter 10/14/2020  ? S/P total knee arthroplasty, 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  ? Nephrolithiasis 07/08/2017  ? Abnormal EKG 06/18/2017  ? Preop cardiovascular exam 06/18/2017  ? History of myocardial infarction 06/18/2017  ? Chronic joint pain 10/08/2016  ? Asthma 10/08/2016  ? History of kidney stones 10/08/2016  ? Insomnia 10/08/2016  ? Diabetes mellitus type 2, noninsulin dependent (HCC) 09/10/2013  ? Obesity (BMI 30-39.9) 09/10/2013  ? GERD 03/15/2009  ? ?PCP:  Courtney Paris, NP ?Pharmacy:   ?[id number] [place], [place] - [address] ?[address] ?[place] [address] ?Phone: [phone] Fax: [phone] ? ? ? ? ?Social Determinants of Health (SDOH) Interventions ?  ? ?Readmission Risk Interventions ?   ? View : No data to display.  ?  ?  ?  ? ? ? ?

## 2021-10-05 NOTE — Consult Note (Addendum)
?Cardiology Consultation:  ? ?Patient ID: [name] ?MRN: [id number]; DOB: [birth year] ? ?Admit date: 10/04/2021 ?Date of Consult: 10/05/2021 ? ?PCP:  Simona Huh, NP ?  ?[facility] Providers ?Cardiologist:  Previously seen by Dr. Geraldo Pitter in 2019 ? ?Patient Profile:  ? ?[name] is a 66 y.o. female with a history of normal coronaries on cardiac catheterization in 2012, type 2 diabetes mellitus, asthma, GERD, hiatal hernia, chronic pain, and mood disorder who is being seen 10/05/2021 for the evaluation of syncope at the request of Dr. Ardelia Mems. ? ?History of Present Illness:  ? ?[name] is a 66 year old female with the above history. Remote cardiac catheterization in 2012 showed normal coronaries. She was then seen by Dr. Geraldo Pitter once in 2019 for pre-op evaluation. Nuclear stress test was ordered and showed no evidence of ischemia. During an admission in 09/2020 for spinal stenosis, she was noted to have frequent PVCs. Echo was ordered and showed LVEF of 60-65% with grade 1 diastolic dysfunction. She has not been seen by Cardiology since 2019. ? ?Patient presented to the ED yesterday via EMS for further evaluation of syncope. Patient was waiting in the car pick up line at school to pick up her grandchildren when she had a syncopal episode. Another bystander saw it happen and called EMS. When EMS arrived, patient was disoriented. Systolic BP initially in the 90s. She was given 500 mL of normal saline and became alert and oriented in route. Upon arrival to the ED, EKG showed normal sinus rhythm with bigeminy PVC. High-sensitivity troponin negative x2. WBC 8.9, Hgb 11.3, Plts 198. Na 139, K 5.2, Glucose 116, BUN 48, Cr 2.58 (baseline around 0.7). LFTs normal. Magnesium 2.1. Ethanol normal <10. She was admitted for further evaluation/management of syncope and AKI. Cardiology consulted for further evaluation. ? ?At the time of this evaluation, patient resting comfortably no acute distress.  We discussed this  possible syncopal episode yesterday.  She states she has been in her usual state of health but has been feeling a little more tired/groggy lately.  She went to go pick up her grandchildren at school yesterday and while waiting in the carpool line she "got too heated."  She states she vaguely remembers people at the school calling her name and then opening her car door where they walked her to the school into the nurses station where she waited for EMS.  She was told that she was slurring her speech at that time.  She denies any prodromal symptoms prior to this - no chest pain, shortness of breath, palpitations, lightheadedness, dizziness.  He does state that she has had a syncopal episode before and states she "blacked out" couple of months ago while sitting on her bed.  She does not really remember this event but her daughter reportedly told her that she blacked out.  She was not evaluated by medical provider after this.  She does not remember having any prodromal symptoms prior to that event either.  She denies any recent fevers or illnesses.  She states she has been eating and drinking well except on the day of presentation when she states she only had a half a bottle of water.  She denies any nasal congestion, cough, nausea, vomiting, or diarrhea.  She has difficulty laying flat at night due to chronic back pain but no orthopnea or PND.  She has noticed some lower extremity edema over the last couple months but this typically improves with elevating her legs at night. ? ?  Of note, patient has chronic pain and is on a lot of medications with sedating of effects.  She takes OxyContin 15 mg twice daily as well as Percocet 10-325 mg 3 times daily as needed.  She is also on Gabapentin, Buprenorphine patch, Amitriptyline, Baclofen, Abilify, and Zoloft.  She states she took her OxyContin in, Percocet, and gabapentin around 6:30 AM on the morning of presentation and then went to pick up her grandchildren around 1:30pm  which is not unusual for her (in fact she states she normally takes 2 other medicines in the morning as well but did not and that day).  She denies any recent dose adjustments of these medications.  She states she normally tolerates all of these medicines well but has been feeling more groggy lately. ? ?Past Medical History:  ?Diagnosis Date  ? Anxiety   ? Arthritis   ? Asthma   ? Depression   ? Dysrhythmia   ? History of GI bleed   ? 2010--- UPPER ESOPHAGITIS/ DUODERAL ULCER/ EROSION  ? History of hiatal hernia   ? History of kidney stones   ? History of shingles   ? 2013  ? Myocardial infarction ([place]) 10 years ago   ? Nephrolithiasis   ? BILATERAL  ? Right ureteral stone   ? Type 2 diabetes, diet controlled ([place])   ? Urgency of urination   ? ? ?Past Surgical History:  ?Procedure Laterality Date  ? ANTERIOR CERVICAL DECOMP/DISCECTOMY FUSION N/A 10/20/2020  ? Procedure: C5-6, C6-7 ANTERIOR CERVICAL DISCECTOMY, C6 CORPECTOMY, C5-C7 PLATE, ALLOGRAFT;  Surgeon: Jessy Oto, MD;  Location: [facility];  Service: Orthopedics;  Laterality: N/A;  ? CARDIAC CATHETERIZATION  02-10-2009  DR BENSIMHON  ? NORMAL CORONARIES/  LOW NORMAL LVF WITHOUT WALL MOTION ABNORMALITIES  ? CARDIAC CATHETERIZATION  11-21-2010  DR Einar Gip  ? NORMAL CORONARIES/  EF 50-55%  ? CHOLECYSTECTOMY  1990s  ? back in [place]   ? CYSTOSCOPY W/ URETERAL STENT PLACEMENT Right 09/04/2013  ? Procedure: CYSTOSCOPY WITH RETROGRADE PYELOGRAM/URETERAL STENT PLACEMENT;  Surgeon: Molli Hazard, MD;  Location: [facility];  Service: Urology;  Laterality: Right;  ? CYSTOSCOPY WITH RETROGRADE PYELOGRAM, URETEROSCOPY AND STENT PLACEMENT Right 09/27/2013  ? Procedure: CYSTOSCOPY WITH RETROGRADE PYELOGRAM, URETEROSCOPY AND STENT EXCHANGE;  Surgeon: Molli Hazard, MD;  Location: [facility];  Service: Urology;  Laterality: Right;  ? HOLMIUM LASER APPLICATION Right 0000000  ? Procedure: HOLMIUM LASER APPLICATION;  Surgeon: Molli Hazard, MD;   Location: [facility];  Service: Urology;  Laterality: Right;  ? HYSTEROSCOPY WITH D & C  01-25-2003  ? IR URETERAL STENT RIGHT NEW ACCESS W/O SEP NEPHROSTOMY CATH  07/08/2017  ? LAPAROSCOPIC ASSISTED VAGINAL HYSTERECTOMY  03-02-2003  ? W/  BILATERAL SALPINGOOPHORECTOMY  ? LAPAROSCOPY  EXTENSIVE LYSIS ADHESIONS/  REDO PARAESOPHAGEAL HIATAL HERNIA WITH PRIMARY CLOSURE AND MESH/ NISSEN FUNDOPLICATION (1cm)/  REPAIR GASTROTOMY  08-22-2009  ? NEPHROLITHOTOMY Right 07/08/2017  ? Procedure: NEPHROLITHOTOMY PERCUTANEOUS;  Surgeon: Festus Aloe, MD;  Location: [facility];  Service: Urology;  Laterality: Right;  ? NISSEN FUNDOPLICATION  [id number]  ? W/  CHOLECYSTECTOMY  ? SHOULDER ARTHROSCOPY WITH SUBACROMIAL DECOMPRESSION, ROTATOR CUFF REPAIR AND BICEP TENDON REPAIR Left 08-02-2010  ? AND LABRAL DEBRIDEMENT  ? TOTAL KNEE ARTHROPLASTY Right 07-01-2006  ? TOTAL KNEE ARTHROPLASTY Left 06/08/2020  ? Procedure: LEFT TOTAL KNEE ARTHROPLASTY;  Surgeon: Meredith Pel, MD;  Location: [facility];  Service: Orthopedics;  Laterality: Left;  ? TOTAL KNEE REVISION  02/25/2012  ? Procedure: TOTAL KNEE REVISION;  Surgeon: Meredith Pel, MD;  Location: [facility];  Service: Orthopedics;  Laterality: Right;  Revise right total knee arthroplasty  ? TUBAL LIGATION  1990  ? WRIST FUSION Left 2004  ? RETAINED HARDWARE  ?  ? ?Home Medications:  ?Prior to Admission medications   ?Medication Sig Start Date End Date Taking? Authorizing Provider  ?acetaminophen (TYLENOL) 325 MG tablet Take 1-2 tablets (325-650 mg total) by mouth every 6 (six) hours as needed for mild pain (pain score 1-3 or temp > 100.5). ?Patient taking differently: Take 325 mg by mouth every 6 (six) hours as needed for mild pain (pain score 1-3 or temp > 100.5). 06/09/20  Yes Meredith Pel, MD  ?ADVAIR [facility] 115-21 MCG/ACT inhaler Inhale 2 puffs into the lungs 2 (two) times daily as needed (wheezing and shortness of breath). 09/17/21  Yes [provider]   ?amitriptyline (ELAVIL) 50 MG tablet Take 50 mg by mouth at bedtime. 09/17/21  Yes [provider]  ?ARIPiprazole (ABILIFY) 5 MG tablet Take 5 mg by mouth daily.   Yes [provider]  ?baclofen (

## 2021-10-05 NOTE — Progress Notes (Signed)
Family Medicine Teaching Service ?Daily Progress Note ?Intern Pager: [phone] ? ?Patient name: [name] Medical record number: [id number] ?Date of birth: 10/09/1955 Age: 66 y.o. Gender: female ? ?Primary Care Provider: Simona Huh, NP ?Consultants: Cardiology ?Code Status: Full ? ?Pt Overview and Major Events to Date:  ?5/11: Admitted ? ?Assessment and Plan: ?PHALA VEIGEL is a 66 y.o. female who presented with brief loss of consciousness. PMH is significant for T2DM, obesity, mood disorder, constipation, HLD, asthma, chronic back pain, insomnia. ? ?Syncope, unclear etiology- likely multifactorial ?Likely 2/2 dehydration w/ polypharmacy on chronic narcotics/sedating medications. Potential underlying cardiac etiology? No CP, chest pressure but EKG w/ bigeminy and unclear cardiac history- had normal stress test in 2019 and echocardiogram in 2022 w/ LVEF 60-65% without wall motion abnormalities. UDS negative. ?Feels well and at her baseline this morning.  ?- Fall precautions ?- Cardiology consulted ?- Continue IVF hydration ?- Cardiac monitoring ?- Cardiology consulted ?- AM CBC, BMP ? ?AKI, likely pre-renal ?Cr 1.63 from 2.58, significantly improved. Will continue to monitor- has dry lips on exam. ?- NS IVF at 75 mL/hr ?- Encourage PO intake ?- Strict I/O ?- Avoid nephrotoxic agents ? ?Hyperkalemia ?K+ 4.6 this am, down from 5.2 ?- Monitor BMP ? ?Polypharmacy  Chronic pain on chronic narcotics ?Unclear how much medication patient is actually taking. Need to med recc with daughter Jones Broom- will encourage her to bring pain medication bottles in. Tammie has concerns about how much medication pt is taking.  ?Of note- patient had 3 buprenorphine patches on today, 2 of which were placed while here. I removed 2 patches and she has one placed on left lower back with start date of 5/12. ?- 10mg  Oxycontin ER BID ?- D/c PRN oxycodone ?- Buprenorphine 10mg  patch x1 ?- Narcan PRN ?- COWS ? ?T2DM ?CBG all <180. ?- sSSI ?-  Monitor CBG ?- Clarify home medication regimen w/ daughter Jones Broom ? ?Other conditions chronic and stable ?Constipation, HLD, Asthma, Insomnia, Obesity, Mood Disorder ? ?FEN/GI: Heart healthy/carb modified ?PPx: Lovenox ?Dispo:Home pending clinical improvement . Barriers include further inpatient workup.  ? ?Subjective:  ?Feels well this morning. Says she feels back to her baseline. No CP, chest pressure, abdominal pain. Has some dizziness with standing.  Had a fall a few months ago at the orthodontist when she was standing outside. Says her left knee is burning a bit. ? ?I watched her walk to the bathroom using walker with very minimal assist from NT and RN. ? ?Objective: ?Temp:  [97.6 ?F (36.4 ?C)-98.4 ?F (36.9 ?C)] 97.6 ?F (36.4 ?C) (05/12 [id number]) ?Pulse Rate:  [34-88] 82 (05/12 0846) ?Resp:  [12-22] 16 (05/12 0846) ?BP: (86-140)/(44-87) 109/87 (05/12 0846) ?SpO2:  [90 %-100 %] 90 % (05/12 0846) ?Weight:  [73.9 kg] 73.9 kg (05/11 1558) ?Physical Exam: ?General: Alert, oriented, in no distress, responsive to all questions ?Cardiovascular: Regularly irregular rhythm, no tachycardia or bradycardia.  ?Respiratory: Normal work of breathing on room air. Lungs clear in all fields ?Abdomen: Soft, obese, NTND, normal bowel sounds ?Extremities: Warm, dry. Able to lift both extremities and resist gravity.  ? ?Laboratory: ?Recent Labs  ?Lab 10/04/21 ?1628 10/05/21 ?0253  ?WBC 8.9 6.5  ?HGB 11.3* 10.7*  ?HCT 36.5 35.6*  ?PLT 198 184  ? ?Recent Labs  ?Lab 10/04/21 ?1628 10/05/21 ?0253  ?NA 139 137  ?K 5.2* 4.6  ?CL 109 107  ?CO2 26 25  ?BUN 48* 38*  ?CREATININE 2.58* 1.63*  ?CALCIUM 9.2 9.0  ?PROT 6.7  --   ?  BILITOT 0.8  --   ?ALKPHOS 90  --   ?ALT 13  --   ?AST 18  --   ?GLUCOSE 116* 130*  ? ? ?Imaging/Diagnostic Tests: ?DG Chest Portable 1 View ? ?Result Date: 10/04/2021 ?CLINICAL DATA:  Syncope EXAM: PORTABLE CHEST 1 VIEW COMPARISON:  10/14/2020 FINDINGS: Hardware in the cervical spine. Borderline cardiomegaly. No focal  opacity, pleural effusion, or pneumothorax. IMPRESSION: No active disease.  Borderline cardiac enlargement Electronically Signed   By: Donavan Foil M.D.   On: 10/04/2021 17:28  ? ?VAS [place] LOWER EXTREMITY VENOUS (DVT) (7a-7p) ? ?Result Date: 10/04/2021 ? Lower Venous DVT Study Patient Name:  [name]  Date of Exam:   10/04/2021 Medical Rec #: [id number]     Accession #:    [id number] Date of Birth: December 13, 1955     Patient Gender: F Patient Age:   22 years Exam Location:  [facility] Procedure:      VAS [place] LOWER EXTREMITY VENOUS (DVT) Referring Phys: JOSHUA LONG --------------------------------------------------------------------------------  Indications: Swelling, patient states "feels like it's burning". Other Indications: History of left knee surgery. Comparison Study: No prior studies. Performing Technologist: Darlin Coco RDMS, RVT  Examination Guidelines: A complete evaluation includes B-mode imaging, spectral Doppler, color Doppler, and power Doppler as needed of all accessible portions of each vessel. Bilateral testing is considered an integral part of a complete examination. Limited examinations for reoccurring indications may be performed as noted. The reflux portion of the exam is performed with the patient in reverse Trendelenburg.  +-----+---------------+---------+-----------+----------+--------------+ RIGHTCompressibilityPhasicitySpontaneityPropertiesThrombus Aging +-----+---------------+---------+-----------+----------+--------------+ CFV  Full           Yes      Yes                                 +-----+---------------+---------+-----------+----------+--------------+   +---------+---------------+---------+-----------+----------+--------------+ LEFT     CompressibilityPhasicitySpontaneityPropertiesThrombus Aging +---------+---------------+---------+-----------+----------+--------------+ CFV      Full           Yes      Yes                                  +---------+---------------+---------+-----------+----------+--------------+ SFJ      Full                                                        +---------+---------------+---------+-----------+----------+--------------+ FV Prox  Full                                                        +---------+---------------+---------+-----------+----------+--------------+ FV Mid   Full                                                        +---------+---------------+---------+-----------+----------+--------------+ FV DistalFull                                                        +---------+---------------+---------+-----------+----------+--------------+  PFV      Full                                                        +---------+---------------+---------+-----------+----------+--------------+ POP      Full           Yes      Yes                                 +---------+---------------+---------+-----------+----------+--------------+ PTV      Full                                                        +---------+---------------+---------+-----------+----------+--------------+ PERO     Full                                                        +---------+---------------+---------+-----------+----------+--------------+ Gastroc  Full                                                        +---------+---------------+---------+-----------+----------+--------------+    Summary: RIGHT: - No evidence of common femoral vein obstruction.  LEFT: - There is no evidence of deep vein thrombosis in the lower extremity.  - No cystic structure found in the popliteal fossa.  *See table(s) above for measurements and observations.    Preliminary    ? ? ?Orvis Brill, DO ?10/05/2021, 9:44 AM ?PGY-1, [facility] ?[place] Intern pager: [phone], text pages welcome ? ?

## 2021-10-05 NOTE — Progress Notes (Signed)
Tried to call patient's daughter Tyler Pita, however went to VM. ? ?Towanda Octave MD  ?PGY3 ?[facility]  ?

## 2021-10-05 NOTE — Progress Notes (Signed)
Daughter called and stated that she just missed the MD phone call please call daughter with update of plan of care. ?

## 2021-10-05 NOTE — Hospital Course (Addendum)
[name] is a 66 y.o. female presenting with loss of consciousness earlier today concerning for syncope and AKI. PMH is significant for T2DM, obesity, mood disorder, constipation, HLD, asthma, chronic back pain, insomnia. Her hospital course is below.  ?  ?Syncope ?Patient being admitted for work-up of syncope today. In the ED vitals have been appropriate with intermittent bradycardia due to irregular rhythm on monitor, saturating well on room air and afebrile.  CBC within normal limits.  Potassium elevated to 5.2, significant AKI to 2.58, no hypoglycemia, alcohol level less than 10.  Troponins trended flat 16 >14. EKG showed ventricular bigeminy, chest x-ray with borderline cardiac enlargement, and DVT ultrasound that was negative in lower extremity.  She was given a 500 mL bolus in the ED. She was disoriented per EMS initially and had blood pressures with systolics in the 0000000. Home medications on admission include flexeril 5 mg TID prn, baclofen 10 mg TID, robaxin 500 mg, gabapentin 800 mg TID, oxycodone-acetaminophen 10-325, Oxycontin ER 15 mg BID, glipizide 5 mg daily, amitriptyline 50 mg, Ambien 5 mg daily, and buprenorphine 20 mcg/hr patch weekly. Cardiology was consulted, they do not believe this was a cardiac event. Given strong history of polypharmacy, inappropriate diabetic regimen, and patient confusion regarding meds, we believe this syncope is medication-related. We reduced her sedating medications and recommended close follow up with a new PCP. She was previously receiving her primary care through her pain clinic nurse practitioner, we recommend finding a dedicated PCP such as family medicine or internal medicine. Before discharge, we stopped her home glipizide, janumet, ambien, and baclofen. We reduced her gabapentin and amitriptyline by half; recommend weaning further with PCP. Cardiology will conduct outpatient heart monitor study.  ?  ?AKI, likely pre-renal; resolved ?Significant AKI to 2.58  creatinine with baseline creatinine around 0.7.  Received one 500 mL LR bolus in ED. Says she only drank one third of what she usually drinks day prior to admission, (drinks about 3 16 oz bottles of water daily) . AKI resolved before admission; creatinine on day of discharge 0.84. ? ?Left lower extremity swelling ?Patient had swelling on left lower extremity with tenderness in popliteal fossa, mild erythema on anterior thigh as well as some warmth in comparison to the right lower extremity.  No distinct lines or wounds noted.  Patient has issue lifting left leg higher than a few inches off the bed which she says has been going on for around 5 months but has been getting worse.  Does have a history of a knee replacement in both legs with the surgery in the left leg being 5 months ago.  DVT ultrasound was negative. Recommend PCP follow up.  ? ?Ventricular bigeminy ?EKG and cardiac monitoring showed ventricular bigeminy. Patient denies feeling any palpitations but says she does have a known history of "skipping a beat."  ? ?Chronic Pain  knee and back pain ?Home medications of flexeril 5 mg TID prn, baclofen 10 mg TID, robaxin 500 mg, gabapentin 800 mg TID, oxycodone-acetaminophen 10-325 , Oxycontin ER 15 mgBID, buprenorphine 20 mcg/hr patch weekly, lidocaine patches. PDMP confirms with recent fill dates of opioids. See primary problem for med changes.  ? ?All other conditions chronic and stable: ?DM2 ?Obesity ?Mood disorder ?HLD  Hx of MI 2019 ?Asthma ? ?Discharge recommendations: ?Lipid panel shows uncontrolled lipids. LDL 91, triglycerides 276, VLDL 55. We believe this is fasting. PCP to recheck outpatient, statin started at d/c. ?Diabetes regimen with multiple hypoglycemia-causing meds. Discontinued glipizide and janumet  while inpatient. Started metformin 1000 mg BID. PCP to follow up A1c.  ?Unsafe pain regimen with layering of multiple sedating meds - discontinued and reduced many while inpatient. Recommend slow  weaning with pain medicine clinic over extended periods of time.  ?Discuss your pain meds with your new PCP. We want you to eventually stop the oxycodone and take only percoset.  ?AKI on admission, unclear etiology. PCP to recheck at follow up.  ?Will need cardiology follow up outpatient. Given history of MI in 2019 and ventricular bigeminy while admitted. PCP to ensure connection.  ?Home w/ HH and rolling walker at D/c ?Amitriptyline dose reduced to 25 mg qhs, pcp to reassess ?Gabapentin reduced to 400 mg, TID, PCP to reassess ?

## 2021-10-05 NOTE — Progress Notes (Unsigned)
Enrolled patient for a 14 day ZIo XT monitor to be mailed to patients home ? ?Dr Elease Hashimoto to read ?

## 2021-10-05 NOTE — Discharge Instructions (Addendum)
Dear [name], ? ?Thank you for letting us participate in your care. You were hospitalized for a syncopal episode. We believe this is due to polypharmacy, basically an overdose or interaction of the sedating medicines you are taking.  ? ?POST-HOSPITAL & CARE INSTRUCTIONS ?We recommend that you find a primary care doctor. Your pain clinic should only be managing your pain. You will need a primary care doctor (either family medicine or internal medicine) to manage your overall health care. We gave you a list of PCPs while you were admitted.  ?Continue taking the Percocet and tyelnol together, for pain. ?You are on way too many sedating medications. We want you to talk to your pain clinic about improving your chronic pain regimen to improve your daily functioning without falling asleep or passing out during the day. Goal is to work with your new PCP to stop the oxycodone in the next 2-4 weeks and continue the percocet only.  ?After you are off the oxycodone, work with PCP to wean off of/down the percocet use to as minimally as possible. ?START Crestor 20 mg daily, your cholesterol medicine.  ?START Metformin 1000 mg twice daily. This is to treat your diabetes.  ?START Aspirin 81 mg every day. This is for heart health and heart attack prevention.  ?MODIFY your Ozempic. This medicine should only be injected under the skin WEEKLY. ?STOP your home GlipiZIDE (GLUCOTROL XL), a diabetes medicine.  ?STOP your home Janumet, a diabetes medicine.  ?STOP your Ambien. Do NOT take this at home. Discuss alternatives with your new primary care doctor.  ?STOP your Baclofen. Do NOT take this at home. Discuss alternatives/necessity with your new primary care doctor. ?Go to your follow up appointments (listed below) ? ? ?DOCTOR'S APPOINTMENT   ?No future appointments. ? Follow-up Information   ? ? [facility].   ?Specialty: Home Health Services ?Contact information: ?[address] ?[address] ?[place]  [address] ?[phone] ? ? ?  ?  ? ? Revankar, Aundra Dubin, MD Follow up.   ?Specialty: Cardiology ?Why: The cardiology office has arranged for you to have an outpatient heart monitor and should also contact you within 1-2 business days to arrange for hospital follow-up. ?Contact information: ?[address] ?[address] ?[place]  [place] [address] ?[phone] ? ? ?  ?  ? ?  ?  ? ?  ? ? ?Take care and be well! ? ?Family Medicine Teaching Service Inpatient Team ?[facility]  ?[facility]  ?[address] [place], [place] [address] ?[phone] ?

## 2021-10-05 NOTE — Progress Notes (Signed)
*  PRELIMINARY RESULTS* ?Echocardiogram ?2D Echocardiogram has been performed. ? ?[name] ?10/05/2021, 4:22 PM ?

## 2021-10-06 LAB — BASIC METABOLIC PANEL
Anion gap: 9 (ref 5–15)
BUN: 22 mg/dL (ref 8–23)
CO2: 21 mmol/L — ABNORMAL LOW (ref 22–32)
Calcium: 9.1 mg/dL (ref 8.9–10.3)
Chloride: 108 mmol/L (ref 98–111)
Creatinine, Ser: 0.91 mg/dL (ref 0.44–1.00)
GFR, Estimated: >60 mL/min (ref >60)
Glucose, Bld: 121 mg/dL — ABNORMAL HIGH (ref 70–99)
Potassium: 4.5 mmol/L (ref 3.5–5.1)
Sodium: 138 mmol/L (ref 135–145)

## 2021-10-06 LAB — GLUCOSE, CAPILLARY
Glucose-Capillary: 125 mg/dL — ABNORMAL HIGH (ref 70–99)
Glucose-Capillary: 123 mg/dL — ABNORMAL HIGH (ref 70–99)
Glucose-Capillary: 127 mg/dL — ABNORMAL HIGH (ref 70–99)
Glucose-Capillary: 162 mg/dL — ABNORMAL HIGH (ref 70–99)
Glucose-Capillary: 104 mg/dL — ABNORMAL HIGH (ref 70–99)
Glucose-Capillary: 105 mg/dL — ABNORMAL HIGH (ref 70–99)

## 2021-10-06 LAB — TROPONIN I (HIGH SENSITIVITY)
Troponin I (High Sensitivity): 31 ng/L — ABNORMAL HIGH (ref <18)
Troponin I (High Sensitivity): 32 ng/L — ABNORMAL HIGH (ref <18)

## 2021-10-06 LAB — CBC
HCT: 35.1 % — ABNORMAL LOW (ref 36.0–46.0)
Hemoglobin: 11.1 g/dL — ABNORMAL LOW (ref 12.0–15.0)
MCH: 27.3 pg (ref 26.0–34.0)
MCHC: 31.6 g/dL (ref 30.0–36.0)
MCV: 86.5 fL (ref 80.0–100.0)
Platelets: 187 10*3/uL (ref 150–400)
RBC: 4.06 MIL/uL (ref 3.87–5.11)
RDW: 13.8 % (ref 11.5–15.5)
WBC: 6.2 10*3/uL (ref 4.0–10.5)
nRBC: 0 % (ref 0.0–0.2)

## 2021-10-06 MED ORDER — OXYCODONE-ACETAMINOPHEN 5-325 MG PO TABS
1.0000 | ORAL_TABLET | Freq: Four times a day (QID) | ORAL | Status: DC
Start: 2021-10-06 — End: 2021-10-08
  Administered 2021-10-06 – 2021-10-07 (×5): 1 via ORAL
  Filled 2021-10-06 (×5): qty 1

## 2021-10-06 MED ORDER — ENOXAPARIN SODIUM 40 MG/0.4ML IJ SOSY
40.0000 mg | PREFILLED_SYRINGE | INTRAMUSCULAR | Status: DC
  Administered 2021-10-06: 40 mg via SUBCUTANEOUS
  Filled 2021-10-06: qty 0.4

## 2021-10-06 NOTE — Progress Notes (Signed)
? ?Progress Note ? ?Patient Name: [name] ?Date of Encounter: 10/06/2021 ? ?Primary Cardiologist:   None ? ? ?Subjective  ? ?No chest pain.  No SOB.  No dizziness or palpitations.  Ambulated in the room ? ?Inpatient Medications  ?  ?Scheduled Meds: ? acetaminophen  650 mg Oral Q6H  ? ARIPiprazole  5 mg Oral Daily  ? buprenorphine  2 patch Transdermal Q Thu  ? enoxaparin (LOVENOX) injection  30 mg Subcutaneous Q24H  ? fluticasone furoate-vilanterol  1 puff Inhalation Daily  ? insulin aspart  0-9 Units Subcutaneous Q4H  ? oxyCODONE  10 mg Oral Q12H  ? polyethylene glycol  17 g Oral Daily  ? senna-docusate  1 tablet Oral Daily  ? sertraline  200 mg Oral QHS  ? ?Continuous Infusions: ? sodium chloride 75 mL/hr at 10/05/21 2048  ? ?PRN Meds: ?naLOXone (NARCAN)  injection  ? ?Vital Signs  ?  ?Vitals:  ? 10/05/21 1635 10/05/21 1942 10/06/21 0334 10/06/21 0818  ?BP: 128/84 (!) 156/86 (!) 145/71   ?Pulse: 87 88 91   ?Resp: 18 19 18    ?Temp: 97.7 ?F (36.5 ?C) 97.9 ?F (36.6 ?C) 98.2 ?F (36.8 ?C)   ?TempSrc: Oral Oral Oral   ?SpO2: 97% 95% 96% 97%  ?Weight:      ?Height:      ? ? ?Intake/Output Summary (Last 24 hours) at 10/06/2021 0949 ?Last data filed at 10/06/2021 0700 ?Gross per 24 hour  ?Intake 727.55 ml  ?Output 400 ml  ?Net 327.55 ml  ? ?Filed Weights  ? 10/04/21 1558  ?Weight: 73.9 kg  ? ? ?Telemetry  ?  ?NSR with PVCs - Personally Reviewed ? ?ECG  ?  ?NA - Personally Reviewed ? ?Physical Exam  ? ?GEN: No acute distress.   ?Neck: No  JVD ?Cardiac: RRR, 2/6 apical systolic murmur, no diastolic murmurs, rubs, or gallops.  ?Respiratory: Clear  to auscultation bilaterally. ?GI: Soft, nontender, non-distended  ?MS: No  edema; No deformity. ?Neuro:  Nonfocal  ?Psych: Normal affect  ? ?Labs  ?  ?Chemistry ?Recent Labs  ?Lab 10/04/21 ?1628 10/05/21 ?0253 10/06/21 ?0122  ?NA 139 137 138  ?K 5.2* 4.6 4.5  ?CL 109 107 108  ?CO2 26 25 21*  ?GLUCOSE 116* 130* 121*  ?BUN 48* 38* 22  ?CREATININE 2.58* 1.63* 0.91  ?CALCIUM 9.2 9.0  9.1  ?PROT 6.7  --   --   ?ALBUMIN 3.6  --   --   ?AST 18  --   --   ?ALT 13  --   --   ?ALKPHOS 90  --   --   ?BILITOT 0.8  --   --   ?GFRNONAA 20* 35* >60  ?ANIONGAP 4* 5 9  ?  ? ?Hematology ?Recent Labs  ?Lab 10/04/21 ?1628 10/05/21 ?0253 10/06/21 ?0122  ?WBC 8.9 6.5 6.2  ?RBC 4.01 3.98 4.06  ?HGB 11.3* 10.7* 11.1*  ?HCT 36.5 35.6* 35.1*  ?MCV 91.0 89.4 86.5  ?MCH 28.2 26.9 27.3  ?MCHC 31.0 30.1 31.6  ?RDW 14.3 14.3 13.8  ?PLT 198 184 187  ? ? ?Cardiac EnzymesNo results for input(s): TROPONINI in the last 168 hours. No results for input(s): TROPIPOC in the last 168 hours.  ? ?BNPNo results for input(s): BNP, PROBNP in the last 168 hours.  ? ?DDimer No results for input(s): DDIMER in the last 168 hours.  ? ?Radiology  ?  ?Cardiac Studies  ? ?ECHO:   ? ? 1. Wall motion challening  with frequent PVCs. no significant WMA. Left  ?ventricular ejection fraction, by estimation, is 50 to 55%. The left  ?ventricle has low normal function. There is mild left ventricular  ?hypertrophy. Left ventricular diastolic  ?parameters are consistent with Grade II diastolic dysfunction  ?(pseudonormalization).  ? 2. Right ventricular systolic function is normal. The right ventricular  ?size is normal.  ? 3. The mitral valve is normal in structure. Trivial mitral valve  ?regurgitation.  ? 4. The aortic valve was not well visualized. Aortic valve regurgitation  ?is not visualized.  ? 5. The inferior vena cava is normal in size with greater than 50%  ?respiratory variability, suggesting right atrial pressure of 3 mmHg.  ? ? ? ?Patient Profile  ?   ?66 y.o. female with a history of normal coronaries on cardiac catheterization in 2012, type 2 diabetes mellitus, asthma, GERD, hiatal hernia, chronic pain, and mood disorder who is being seen 10/05/2021 for the evaluation of syncope at the request of Dr. Ardelia Mems. ? ?Assessment & Plan  ?  ?SYNCOPE:  Likely related to meds and decreased volume.  Not likely arrhythmogenic.  Plan out patient ZIO.   EF is low normal.  I do not suspect any recent acute cardiovascular event.  This mildly reduced EF can be followed as an out patient.     ?   ?PVCs:  As above.  No sustained arrhythmias overnight  ? 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  MR noted (mild in 2022) is not reported this admission.  ? ?For questions or updates, please contact [facility] ?Please consult [email or web address] for contact info under Cardiology/STEMI. ?  ?Signed, ?Minus Breeding, MD  ?10/06/2021, 9:49 AM   ? ?

## 2021-10-06 NOTE — Progress Notes (Signed)
Physical Therapy Treatment ?Patient Details ?Name: [name] ?MRN: [id number] ?DOB: [birth year] ?Today's Date: 10/06/2021 ? ? ?History of Present Illness Pt is 66 yr old F admitted on 10/04/21 due to syncopal episode.  PMH: anxiety, arthritis, asthma, depression, MI, DM ? ?  ?PT Comments  ? ? Patient able to ambulate safely with use of RW and states she has one at home.  Patient appropriate for home and will have help from her daughter.  Negotiates steps appropriate for home entry and educated to have daughter assist. HHPT appropriate at d/c.    ?Recommendations for follow up therapy are one component of a multi-disciplinary discharge planning process, led by the attending physician.  Recommendations may be updated based on patient status, additional functional criteria and insurance authorization. ? ?Follow Up Recommendations ? Home health PT ?  ?  ?Assistance Recommended at Discharge Intermittent Supervision/Assistance  ?Patient can return home with the following A little help with walking and/or transfers;Assistance with cooking/housework;Help with stairs or ramp for entrance ?  ?Equipment Recommendations ? Rolling walker (2 wheels)  ?  ?Recommendations for Other Services   ? ? ?  ?Precautions / Restrictions Precautions ?Precautions: None  ?  ? ?Mobility ? Bed Mobility ?Overal bed mobility: Independent ?  ?  ?  ?  ?  ?  ?  ?  ? ?Transfers ?Overall transfer level: Modified independent ?Equipment used: Rolling walker (2 wheels) ?  ?  ?  ?  ?  ?  ?  ?  ?  ? ?Ambulation/Gait ?Ambulation/Gait assistance: Supervision ?Gait Distance (Feet): 120 Feet ?Assistive device: Rolling walker (2 wheels) ?Gait Pattern/deviations: Step-through pattern, Decreased stride length ?  ?  ?  ?General Gait Details: with RW safe technique, but mild antalgia on L ? ? ?Stairs ?Stairs: Yes ?Stairs assistance: Supervision, Min guard ?Stair Management: Step to pattern, Sideways, One rail Right ?Number of Stairs: 3 (x 2) ?General stair comments:  side stepping with two hands to one rail. cues for ensuring enough room for feet side by side on steps ? ? ?Wheelchair Mobility ?  ? ?Modified Rankin (Stroke Patients Only) ?  ? ? ?  ?Balance Overall balance assessment: Needs assistance ?Sitting-balance support: No upper extremity supported ?Sitting balance-Leahy Scale: Good ?  ?  ?  ?Standing balance-Leahy Scale: Fair ?  ?  ?  ?  ?  ?  ?  ?  ?  ?  ?  ?  ?  ? ?  ?Cognition Arousal/Alertness: Awake/alert ?Behavior During Therapy: Flat affect ?Overall Cognitive Status: Within Functional Limits for tasks assessed ?  ?  ?  ?  ?  ?  ?  ?  ?  ?  ?  ?  ?  ?  ?  ?  ?  ?  ?  ? ?  ?Exercises   ? ?  ?General Comments General comments (skin integrity, edema, etc.): reports daughter will stay with her at d/c.  Has RW at home ?  ?  ? ?Pertinent Vitals/Pain Pain Assessment ?Pain Assessment: No/denies pain  ? ? ?Home Living   ?  ?  ?  ?  ?  ?  ?  ?  ?  ?   ?  ?Prior Function    ?  ?  ?   ? ?PT Goals (current goals can now be found in the care plan section) Progress towards PT goals: Progressing toward goals ? ?  ?Frequency ? ? ? Min 3X/week ? ? ? ?  ?PT  Plan Current plan remains appropriate  ? ? ?Co-evaluation   ?  ?  ?  ?  ? ?  ?AM-PAC PT "6 Clicks" Mobility   ?Outcome Measure ? Help needed turning from your back to your side while in a flat bed without using bedrails?: None ?Help needed moving from lying on your back to sitting on the side of a flat bed without using bedrails?: None ?Help needed moving to and from a bed to a chair (including a wheelchair)?: None ?Help needed standing up from a chair using your arms (e.g., wheelchair or bedside chair)?: None ?Help needed to walk in hospital room?: None ?Help needed climbing 3-5 steps with a railing? : A Little ?6 Click Score: 23 ? ?  ?End of Session Equipment Utilized During Treatment: Gait belt ?Activity Tolerance: Patient tolerated treatment well ?Patient left: in bed;with call bell/phone within reach ?  ?PT Visit Diagnosis:  Other abnormalities of gait and mobility (R26.89) ?  ? ? ?Time: [id number] ?PT Time Calculation (min) (ACUTE ONLY): 19 min ? ?Charges:  $Gait Training: 8-22 mins          ?          ? ?Magda Kiel, PT ?Acute Rehabilitation Services ?[id number] ?Office:[phone] ?10/06/2021 ? ? ? ?[name] ?10/06/2021, 5:51 PM ? ?

## 2021-10-06 NOTE — Progress Notes (Signed)
Mobility Specialist Progress Note: ? ? 10/06/21 1154  ?Mobility  ?Activity Ambulated with assistance to bathroom  ?Level of Assistance Standby assist, set-up cues, supervision of patient - no hands on  ?Assistive Device Front wheel walker  ?Distance Ambulated (ft) 20 ft  ?Activity Response Tolerated well  ?$Mobility charge 1 Mobility  ? ?Pt received in bathroom needing to get back to bed. No complaints of pain. Left in bed with call bell in reach and all needs met.  ? ?[name] ?Mobility Specialist ?Primary Phone [phone] ? ?

## 2021-10-06 NOTE — Progress Notes (Addendum)
Interim Progress Note ? ?Went to room to review medications. Two daughters in room. Home medications that were brought are listed below (family notes that these are likely not all the medications but they are just what they could find).  ? ?Sertraline 100 mg 2 tablets nightly ?Gabapentin 800 mg TID ?Percocet 10-325 mg TID PRN (filled April 25th, 2023) ?Amitriptyline 25 mg nightly (filled November 2022) ?Lisinopril 2.5 mg daily  ?Tizanidine 4 mg 1.5 tablets TID PRN  ?Amitriptyline 50 mg 1 tablet nightly (filled April 2023) ?Baclofen 10 mg 1 tablet TID PRN  ?Omega 3 capsule BID  ?Oxycontin 15 mg 1 tablet BID (filled April 25th, 2023) ?Aripiprazole 5 mg daily  ?Baclofen 10 mg TID PRN  ?Ondansetron 4 mg TID PRN (2 bottles present)  ?Pravastatin 80 mg daily  ?Fenofibrate 54 mg BID (3 bottles present)  ?Glipizide 2.5 mg daily  ?Amitriptyline 10 mg daily (filled August 2021) ?Etodolac 600 mg daily (filled November 2022)  ? ?Daughters are concerned about polypharmacy, as are we. Their goal would be for their mother to be off of narcotic medications completely. Patient also shares goal about trying to get off of them completely. Family is hoping to find a new PCP for the outpatient setting. They share that patients function is not improved with the narcotic pain medication and that she "just sits on the couch high". They state that she does not get up often nor get out of the house. Long discussion regarding weighing risks of benefits of chronic narcotics. In this case it does not appear to be appropriate, especially since function is not improved. Goal this hospitalization will be to discharge patient on a safer regimen, she will need to continue to f/u outpatient for full taper.  ? ?Medications stored in bag. Will discuss with pharmacy about safely storing outside of room. ?

## 2021-10-06 NOTE — Progress Notes (Signed)
FPTS Brief Progress Note ? ?S:Patient reports that her pain is overall doing well. She reports feeling a little bit shaky and colder this afternoon but has no other acute concerns. Discussed with nurse, requesting another blanket for patient.  ? ?O: ?BP (!) 142/86 (BP Location: Left Arm)   Pulse (!) 52   Temp 98.2 ?F (36.8 ?C) (Oral)   Resp 17   Ht 5\' 2"  (1.575 m)   Wt 73.9 kg   SpO2 97%   BMI 29.81 kg/m?   ?General: NAD, sitting up in bed ?CV: RRR, no murmur appreciated ?Pulm: CTAB, no increased WOB, speaking in full sentences ? ? ?A/P: ?Polypharmacy ?Actively working with patient to decrease pain medication regimen. At this time, patient has scheduled pain medications with hope to avoid buprenorphine administration.  ?- Monitor COWs for withdrawal ?- Oxycodone 10mg  q12h ?- Percocet 5-325mg  q6h ?- Narcan as needed ? ?Rise Patience, DO ?10/06/2021, 10:40 PM ?PGY-2, [place] Night Resident  ?Please page [phone] with questions.  ? ? ?

## 2021-10-06 NOTE — Progress Notes (Signed)
Family Medicine Teaching Service ?Daily Progress Note ?Intern Pager: [phone] ? ?Patient name: [name] Medical record number: [id number] ?Date of birth: [birth year] Age: 66 y.o. Gender: female ? ?Primary Care Provider: Simona Huh, NP ?Consultants: Cardiology ?Code Status: Full ? ?Pt Overview and Major Events to Date:  ?5/11: Admitted ? ?Assessment and Plan: ?[name] is a 66 y.o. female who presented with brief loss of consciousness. PMH is significant for T2DM, obesity, mood disorder, constipation, HLD, asthma, chronic back pain, insomnia. ? ?Possible syncope, unclear etiology- likely dehydration w/ chronic sedating medication use ?Doing well this AM. At baseline. Cardiology recommends o/p f/u in 6 weeks and 2 week [place] for frequent PVCs ?- Fall precautions ?- Cardiac monitoring ? ?AKI, likely pre-renal - improved ?Cr 0.91 this morning, at baseline. ?- NS IVF at 75 mL/hr ?- Strict I/O ? ?Polypharmacy  Chronic pain on narcotics ?Daughter Tammie to bring in pain medicines today. ?- Buprenorphine patch, x1 ?- 10mg  Oxycontin ER BID ?- Narcan PRN ?- COWS ? ?T2DM ?CBG all <180. ?- sSSI ?- Monitor CBG ?- Clarify home regimen today ? ?Other conditions chronic and stable ?Constipation, HLD, Asthma, Insomnia, Obesity, Mood Disorder ? ?FEN/GI: Heart healthy/carb modified ?PPx: Lovenox ?Dispo: Home pending clinical improvement  pending clinical improvement . Barriers include further inpatient workup.  ? ?Subjective:  ?Feeling well this morning. Eating breakfast. No chest pain or abdominal pain. Tells me she was able to get up on her own and go to the bathroom using walker. ? ?Objective: ?Temp:  [97.6 ?F (36.4 ?C)-98.2 ?F (36.8 ?C)] 98.2 ?F (36.8 ?C) (05/13 0334) ?Pulse Rate:  [82-91] 91 (05/13 0334) ?Resp:  [16-19] 18 (05/13 0334) ?BP: (109-156)/(71-87) 145/71 (05/13 0334) ?SpO2:  [90 %-97 %] 96 % (05/13 0334) ?Physical Exam: ?General: Resting in bed comfortably ?Cardiovascular: RRR, no murmurs ?Respiratory:  Clear in all fields, normal WOB on room air ?Abdomen: Soft, NT ND , BS x4 ?Extremities: Warm, well-perfused, post-surgical scars on b/l knees ? ?Laboratory: ?Recent Labs  ?Lab 10/04/21 ?1628 10/05/21 ?0253 10/06/21 ?0122  ?WBC 8.9 6.5 6.2  ?HGB 11.3* 10.7* 11.1*  ?HCT 36.5 35.6* 35.1*  ?PLT 198 184 187  ? ?Recent Labs  ?Lab 10/04/21 ?1628 10/05/21 ?0253 10/06/21 ?0122  ?NA 139 137 138  ?K 5.2* 4.6 4.5  ?CL 109 107 108  ?CO2 26 25 21*  ?BUN 48* 38* 22  ?CREATININE 2.58* 1.63* 0.91  ?CALCIUM 9.2 9.0 9.1  ?PROT 6.7  --   --   ?BILITOT 0.8  --   --   ?ALKPHOS 90  --   --   ?ALT 13  --   --   ?AST 18  --   --   ?GLUCOSE 116* 130* 121*  ? ? ? ? ?Imaging/Diagnostic Tests: ?ECHOCARDIOGRAM COMPLETE ? ?Result Date: 10/05/2021 ?   ECHOCARDIOGRAM REPORT   Patient Name:   [name] Date of Exam: 10/05/2021 Medical Rec #:  [id number]    Height:       62.0 in Accession #:    [id number]   Weight:       163.0 lb Date of Birth:  09-08-1955    BSA:          1.753 m? Patient Age:    89 years     BP:           109/87 mmHg Patient Gender: F            HR:  89 bpm. Exam Location:  Inpatient Procedure: 2D Echo, Cardiac Doppler and Color Doppler Indications:    Frequent PVCs  History:        Patient has prior history of Echocardiogram examinations, most                 recent 10/23/2020. Risk Factors:Diabetes. GERD.  Sonographer:    Clayton Lefort RDCS (AE) Referring Phys: [id number] [place]  1. Wall motion challening with frequent PVCs. no significant WMA. Left ventricular ejection fraction, by estimation, is 50 to 55%. The left ventricle has low normal function. There is mild left ventricular hypertrophy. Left ventricular diastolic parameters are consistent with Grade II diastolic dysfunction (pseudonormalization).  2. Right ventricular systolic function is normal. The right ventricular size is normal.  3. The mitral valve is normal in structure. Trivial mitral valve regurgitation.  4. The aortic valve was not well  visualized. Aortic valve regurgitation is not visualized.  5. The inferior vena cava is normal in size with greater than 50% respiratory variability, suggesting right atrial pressure of 3 mmHg. FINDINGS  Left Ventricle: Wall motion challening with frequent PVCs. no significant WMA. Left ventricular ejection fraction, by estimation, is 50 to 55%. The left ventricle has low normal function. The left ventricular internal cavity size was normal in size. There is mild left ventricular hypertrophy. Left ventricular diastolic parameters are consistent with Grade II diastolic dysfunction (pseudonormalization). Right Ventricle: The right ventricular size is normal. Right ventricular systolic function is normal. Left Atrium: Left atrial size was normal in size. Right Atrium: Right atrial size was normal in size. Pericardium: There is no evidence of pericardial effusion. Mitral Valve: The mitral valve is normal in structure. Trivial mitral valve regurgitation. Tricuspid Valve: The tricuspid valve is normal in structure. Tricuspid valve regurgitation is not demonstrated. Aortic Valve: The aortic valve was not well visualized. Aortic valve regurgitation is not visualized. Aortic valve mean gradient measures 5.0 mmHg. Aortic valve peak gradient measures 8.4 mmHg. Aortic valve area, by VTI measures 1.77 cm?. Pulmonic Valve: The pulmonic valve was not well visualized. Aorta: The aortic root and ascending aorta are structurally normal, with no evidence of dilitation. Venous: The inferior vena cava is normal in size with greater than 50% respiratory variability, suggesting right atrial pressure of 3 mmHg. IAS/Shunts: No atrial level shunt detected by color flow Doppler.  LEFT VENTRICLE PLAX 2D LVIDd:         4.50 cm   Diastology LVIDs:         3.40 cm   LV e' medial:    8.05 cm/s LV PW:         1.20 cm   LV E/e' medial:  15.2 LV IVS:        0.80 cm   LV e' lateral:   7.40 cm/s LVOT diam:     2.00 cm   LV E/e' lateral: 16.5 LV SV:          52 LV SV Index:   30 LVOT Area:     3.14 cm?  RIGHT VENTRICLE             IVC RV Basal diam:  2.70 cm     IVC diam: 2.00 cm RV S prime:     12.60 cm/s TAPSE (M-mode): 2.5 cm LEFT ATRIUM             Index        RIGHT ATRIUM  Index LA diam:        3.80 cm 2.17 cm/m?   RA Area:     12.60 cm? LA Vol (A2C):   46.4 ml 26.48 ml/m?  RA Volume:   27.00 ml  15.41 ml/m? LA Vol (A4C):   55.4 ml 31.61 ml/m? LA Biplane Vol: 51.9 ml 29.61 ml/m?  AORTIC VALVE AV Area (Vmax):    2.01 cm? AV Area (Vmean):   1.91 cm? AV Area (VTI):     1.77 cm? AV Vmax:           145.00 cm/s AV Vmean:          103.000 cm/s AV VTI:            0.293 m AV Peak Grad:      8.4 mmHg AV Mean Grad:      5.0 mmHg LVOT Vmax:         92.90 cm/s LVOT Vmean:        62.700 cm/s LVOT VTI:          0.165 m LVOT/AV VTI ratio: 0.56  AORTA Ao Root diam: 3.00 cm Ao Asc diam:  2.70 cm MITRAL VALVE MV Area (PHT): 5.50 cm?     SHUNTS MV Decel Time: 138 msec     Systemic VTI:  0.16 m MV E velocity: 122.00 cm/s  Systemic Diam: 2.00 cm MV A velocity: 87.40 cm/s MV E/A ratio:  1.40 Phineas Inches Electronically signed by Phineas Inches Signature Date/Time: 10/05/2021/4:34:09 PM    Final    ? ? ?Orvis Brill, DO ?10/06/2021, 7:55 AM ?PGY-1, [facility] ?[place] Intern pager: [phone], text pages welcome ? ?

## 2021-10-07 LAB — BASIC METABOLIC PANEL
Anion gap: 10 (ref 5–15)
BUN: 14 mg/dL (ref 8–23)
CO2: 20 mmol/L — ABNORMAL LOW (ref 22–32)
Calcium: 9.4 mg/dL (ref 8.9–10.3)
Chloride: 107 mmol/L (ref 98–111)
Creatinine, Ser: 0.84 mg/dL (ref 0.44–1.00)
GFR, Estimated: >60 mL/min (ref >60)
Glucose, Bld: 108 mg/dL — ABNORMAL HIGH (ref 70–99)
Potassium: 4 mmol/L (ref 3.5–5.1)
Sodium: 137 mmol/L (ref 135–145)

## 2021-10-07 LAB — GLUCOSE, CAPILLARY
Glucose-Capillary: 109 mg/dL — ABNORMAL HIGH (ref 70–99)
Glucose-Capillary: 113 mg/dL — ABNORMAL HIGH (ref 70–99)
Glucose-Capillary: 108 mg/dL — ABNORMAL HIGH (ref 70–99)
Glucose-Capillary: 99 mg/dL (ref 70–99)

## 2021-10-07 MED ORDER — OXYCODONE-ACETAMINOPHEN 5-325 MG PO TABS: 1.0000 | ORAL_TABLET | Freq: Four times a day (QID) | ORAL | 0 refills | Status: DC

## 2021-10-07 MED ORDER — AMITRIPTYLINE HCL 50 MG PO TABS: 25.0000 mg | ORAL_TABLET | Freq: Every day | ORAL | 0 refills | Status: DC

## 2021-10-07 MED ORDER — GABAPENTIN 800 MG PO TABS: 400.0000 mg | ORAL_TABLET | Freq: Three times a day (TID) | ORAL | 0 refills | Status: DC

## 2021-10-07 MED ORDER — METFORMIN HCL 500 MG PO TABS: 1000.0000 mg | ORAL_TABLET | Freq: Two times a day (BID) | ORAL | 0 refills | Status: DC

## 2021-10-07 MED ORDER — POLYETHYLENE GLYCOL 3350 17 G PO PACK: 17.0000 g | PACK | Freq: Every day | ORAL | 0 refills | Status: AC

## 2021-10-07 MED ORDER — ACETAMINOPHEN 325 MG PO TABS
325.0000 mg | ORAL_TABLET | Freq: Four times a day (QID) | ORAL | Status: DC
  Administered 2021-10-07: 325 mg via ORAL
  Filled 2021-10-07: qty 1

## 2021-10-07 MED ORDER — SENNOSIDES-DOCUSATE SODIUM 8.6-50 MG PO TABS: 1.0000 | ORAL_TABLET | Freq: Every day | ORAL | 0 refills | Status: AC

## 2021-10-07 MED ORDER — ROSUVASTATIN CALCIUM 20 MG PO TABS
20.0000 mg | ORAL_TABLET | Freq: Every day | ORAL | Status: DC
  Administered 2021-10-07: 20 mg via ORAL
  Filled 2021-10-07: qty 1

## 2021-10-07 MED ORDER — ACETAMINOPHEN 325 MG PO TABS: 325.0000 mg | ORAL_TABLET | Freq: Four times a day (QID) | ORAL | 0 refills | Status: AC

## 2021-10-07 MED ORDER — ROSUVASTATIN CALCIUM 20 MG PO TABS: 20.0000 mg | ORAL_TABLET | Freq: Every day | ORAL | 0 refills | Status: DC

## 2021-10-07 MED ORDER — OXYCODONE HCL ER 10 MG PO T12A: 10.0000 mg | EXTENDED_RELEASE_TABLET | Freq: Two times a day (BID) | ORAL | 0 refills | Status: DC

## 2021-10-07 MED ORDER — NALOXONE HCL 0.4 MG/ML IJ SOLN: 0.4000 mg | INTRAMUSCULAR | 0 refills | Status: AC | PRN

## 2021-10-07 NOTE — Progress Notes (Signed)
? ? ?  [facility] will sign off.   ?Medication Recommendations:  No new suggestions ?Other recommendations (labs, testing, etc):  We will arrange an out patient monitor to be sent to her ?Follow up as an outpatient:  We will scheduled follow up and this should print out on her AVS ?Call with further questions.  ?

## 2021-10-07 NOTE — Progress Notes (Signed)
Mobility Specialist Progress Note: ? ? 10/07/21 1244  ?Mobility  ?Activity Ambulated with assistance in hallway  ?Level of Assistance Standby assist, set-up cues, supervision of patient - no hands on  ?Assistive Device Front wheel walker  ?Distance Ambulated (ft) 200 ft  ?Activity Response Tolerated well  ?$Mobility charge 1 Mobility  ? ?Pt received in bed willing to participate in mobility. No complaints of pain. Left in bed with call bell in reach and all needs met.  ? ?[name] ?Mobility Specialist ?Primary Phone [phone] ? ?

## 2021-10-07 NOTE — Plan of Care (Signed)
  Problem: Pain Managment: Goal: General experience of comfort will improve Outcome: Progressing   Problem: Safety: Goal: Ability to remain free from injury will improve Outcome: Progressing   

## 2021-10-07 NOTE — Progress Notes (Signed)
Spoke with Dr. Clayborne Artist regarding reports of multiple diarrhea type stools from previous nurse and pt, with one episode during shift change. Per pt records only 2 bowel movements documented.  Per Dr. Clayborne Artist continue to monitor. ?

## 2021-10-07 NOTE — Discharge Summary (Addendum)
Family Medicine Teaching Service ?Hospital Discharge Summary ? ?Patient name: [name] Medical record number: [id number] ?Date of birth: 17-May-1956 Age: 66 y.o. Gender: female ?Date of Admission: 10/04/2021  Date of Discharge: 10/07/21 ?Admitting Physician: Gerrit Heck, MD ? ?Primary Care Provider: Simona Huh, NP ?Consultants: Cardiology ? ?Indication for Hospitalization: Syncopal episode ? ?Discharge Diagnoses/Problem List:  ?Syncope secondary to polypharmacy ?AKI ?Chronic pain ?T2DM ?Insomnia ?HLD ?Mood disorder ? ?Disposition: Home with home health ? ?Discharge Condition: Stable  ? ?Discharge Exam:  ? ?  10/07/2021  ?  3:41 PM 10/07/2021  ?  7:50 AM 10/07/2021  ?  4:20 AM  ?Vitals with BMI  ?Systolic [id number]  ?Diastolic 123XX123 80 52  ?Pulse 94 89 93  ?  ?General: Well appearing, NAD, obese, white woman ?Cardiovascular: RRR, NRMG ?Respiratory: CTABL ?Abdomen: Soft, NTTP, non-distended ?Extremities: No edema, cap refill < 2 sec ? ?Brief Hospital Course:  ?[name] is a 66 y.o. female presenting with loss of consciousness earlier today concerning for syncope and AKI. PMH is significant for T2DM, obesity, mood disorder, constipation, HLD, asthma, chronic back pain, insomnia. Her hospital course is below.  ?  ?Syncope ?Patient being admitted for work-up of syncope today. In the ED vitals have been appropriate with intermittent bradycardia due to irregular rhythm on monitor, saturating well on room air and afebrile.  CBC within normal limits.  Potassium elevated to 5.2, significant AKI to 2.58, no hypoglycemia, alcohol level less than 10.  Troponins trended flat 16 >14. EKG showed ventricular bigeminy, chest x-ray with borderline cardiac enlargement, and DVT ultrasound that was negative in lower extremity.  She was given a 500 mL bolus in the ED. She was disoriented per EMS initially and had blood pressures with systolics in the 0000000. Home medications on admission include flexeril 5 mg TID prn, baclofen 10 mg  TID, robaxin 500 mg, gabapentin 800 mg TID, oxycodone-acetaminophen 10-325, Oxycontin ER 15 mg BID, glipizide 5 mg daily, amitriptyline 50 mg, Ambien 5 mg daily, and buprenorphine 20 mcg/hr patch weekly. Cardiology was consulted, they do not believe this was a cardiac event. Given strong history of polypharmacy, inappropriate diabetic regimen, and patient confusion regarding meds, we believe this syncope is medication-related. We reduced her sedating medications and recommended close follow up with a new PCP. She was previously receiving her primary care through her pain clinic nurse practitioner, we recommend finding a dedicated PCP such as family medicine or internal medicine. Before discharge, we stopped her home glipizide, janumet, ambien, and baclofen. We reduced her gabapentin and amitriptyline by half; recommend weaning further with PCP. Cardiology will conduct outpatient heart monitor study.  ?  ?AKI, likely pre-renal; resolved ?Significant AKI to 2.58 creatinine with baseline creatinine around 0.7.  Received one 500 mL LR bolus in ED. Says she only drank one third of what she usually drinks day prior to admission, (drinks about 3 16 oz bottles of water daily) . AKI resolved before admission; creatinine on day of discharge 0.84. ? ?Left lower extremity swelling ?Patient had swelling on left lower extremity with tenderness in popliteal fossa, mild erythema on anterior thigh as well as some warmth in comparison to the right lower extremity.  No distinct lines or wounds noted.  Patient has issue lifting left leg higher than a few inches off the bed which she says has been going on for around 5 months but has been getting worse.  Does have a history of a knee replacement in both legs with  the surgery in the left leg being 5 months ago.  DVT ultrasound was negative. Recommend PCP follow up.  ? ?Ventricular bigeminy ?EKG and cardiac monitoring showed ventricular bigeminy. Patient denies feeling any palpitations  but says she does have a known history of "skipping a beat."  ? ?Chronic Pain  knee and back pain ?Home medications of flexeril 5 mg TID prn, baclofen 10 mg TID, robaxin 500 mg, gabapentin 800 mg TID, oxycodone-acetaminophen 10-325 , Oxycontin ER 15 mgBID, buprenorphine 20 mcg/hr patch weekly, lidocaine patches. PDMP confirms with recent fill dates of opioids. See primary problem for med changes.  ? ?All other conditions chronic and stable: ?DM2 ?Obesity ?Mood disorder ?HLD  Hx of MI 2019 ?Asthma ? ?Discharge recommendations: ?Lipid panel shows uncontrolled lipids. LDL 91, triglycerides 276, VLDL 55. We believe this is fasting. PCP to recheck outpatient, statin started at d/c. ?Diabetes regimen with multiple hypoglycemia-causing meds. Discontinued glipizide and janumet while inpatient. Started metformin 1000 mg BID. PCP to follow up A1c.  ?Unsafe pain regimen with layering of multiple sedating meds - discontinued and reduced many while inpatient. Recommend slow weaning with pain medicine clinic over extended periods of time.  ?Discuss your pain meds with your new PCP. We want you to eventually stop the oxycodone and take only percoset.  ?AKI on admission, unclear etiology. PCP to recheck at follow up.  ?Will need cardiology follow up outpatient. Given history of MI in 2019 and ventricular bigeminy while admitted. PCP to ensure connection.  ?Home w/ HH and rolling walker at D/c ?Amitriptyline dose reduced to 25 mg qhs, pcp to reassess ?Gabapentin reduced to 400 mg, TID, PCP to reassess ? ?Significant Procedures: None ? ?Significant Labs and Imaging:  ?Recent Labs  ?Lab 10/04/21 ?1628 10/05/21 ?0253 10/06/21 ?0122  ?WBC 8.9 6.5 6.2  ?HGB 11.3* 10.7* 11.1*  ?HCT 36.5 35.6* 35.1*  ?PLT 198 184 187  ? ?Recent Labs  ?Lab 10/04/21 ?1628 10/05/21 ?0253 10/06/21 ?0122 10/07/21 ?[id number]  ?NA 139 137 138 137  ?K 5.2* 4.6 4.5 4.0  ?CL 109 107 108 107  ?CO2 26 25 21* 20*  ?GLUCOSE 116* 130* 121* 108*  ?BUN 48* 38* 22 14   ?CREATININE 2.58* 1.63* 0.91 0.84  ?CALCIUM 9.2 9.0 9.1 9.4  ?MG 2.1  --   --   --   ?ALKPHOS 90  --   --   --   ?AST 18  --   --   --   ?ALT 13  --   --   --   ?ALBUMIN 3.6  --   --   --   ? ? ?PORTABLE CHEST 1 VIEW 10/04/21  ?COMPARISON:  10/14/2020 ?FINDINGS: ?Hardware in the cervical spine. Borderline cardiomegaly. No focal ?opacity, pleural effusion, or pneumothorax. ?IMPRESSION: ?No active disease.  Borderline cardiac enlargement ? ?Results/Tests Pending at Time of Discharge: None ? ?Discharge Medications:  ?Allergies as of 10/07/2021   ?No Known Allergies ?  ? ?  ?Medication List  ?  ? ?STOP taking these medications   ? ?baclofen 10 MG tablet ?Commonly known as: LIORESAL ?  ?buprenorphine 20 MCG/HR Ptwk ?Commonly known as: BUTRANS ?  ?docusate sodium 100 MG capsule ?Commonly known as: COLACE ?  ?glipiZIDE 2.5 MG 24 hr tablet ?Commonly known as: GLUCOTROL XL ?  ?Janumet 50-1000 MG tablet ?Generic drug: sitaGLIPtin-metformin ?  ?Levemir FlexTouch 100 UNIT/ML FlexPen ?Generic drug: insulin detemir ?  ?oxyCODONE-acetaminophen 10-325 MG tablet ?Commonly known as: PERCOCET ?Replaced by: oxyCODONE-acetaminophen 5-325 MG tablet ?  ?  pravastatin 80 MG tablet ?Commonly known as: PRAVACHOL ?  ? ?  ? ?TAKE these medications   ? ?acetaminophen 325 MG tablet ?Commonly known as: TYLENOL ?Take 1 tablet (325 mg total) by mouth every 6 (six) hours. ?What changed:  ?how much to take ?when to take this ?reasons to take this ?  ?Advair HFA 115-21 MCG/ACT inhaler ?Generic drug: fluticasone-salmeterol ?Inhale 2 puffs into the lungs 2 (two) times daily as needed (wheezing and shortness of breath). ?  ?ADVANCED DIABETIC MULTIVITAMIN PO ?Take 1 tablet by mouth daily. ?  ?albuterol 108 (90 Base) MCG/ACT inhaler ?Commonly known as: VENTOLIN HFA ?Inhale 2 puffs into the lungs every 4 (four) hours as needed for wheezing or shortness of breath. For shortness of breath ?What changed: additional instructions ?  ?amitriptyline 50 MG  tablet ?Commonly known as: ELAVIL ?Take 0.5 tablets (25 mg total) by mouth at bedtime. ?What changed: how much to take ?  ?ARIPiprazole 5 MG tablet ?Commonly known as: ABILIFY ?Take 5 mg by mouth daily. ?  ?aspirin EC 81 MG t

## 2021-10-08 ENCOUNTER — Ambulatory Visit: Payer: Medicare Other | Admitting: Specialist

## 2021-10-11 ENCOUNTER — Telehealth: Payer: Self-pay | Admitting: Cardiology

## 2021-10-11 NOTE — Telephone Encounter (Signed)
[name] is calling to see if Dr. Tomie China will sign an order for pt to receive Home Health Physical Therapy. She states that pt just got out of the hospital and in in the process of getting another PCP. Pt will not see new PCP until 11/23/21 but would like orders before then if possible. Please advise

## 2021-10-11 NOTE — Telephone Encounter (Signed)
Patient requesting to switch from Dr. Tomie China to Dr. Servando Salina to be in [place].

## 2021-10-18 NOTE — Telephone Encounter (Signed)
Left VM for Erin to callback.

## 2021-10-18 NOTE — Telephone Encounter (Signed)
  [name] calling back to f/u. Dr. Acie Fredrickson saw pt in the hospital on 10/05/21 and pt is an establish pt of Dr. Geraldo Pitter again, she has an appt in July with him. Pt doesn't have pcp and wont be seeing new pcp till 06/30

## 2021-10-26 ENCOUNTER — Other Ambulatory Visit: Payer: Self-pay | Admitting: Student

## 2021-10-26 NOTE — Telephone Encounter (Signed)
LVM for patient that provider switch was approved.

## 2021-10-30 ENCOUNTER — Other Ambulatory Visit: Payer: Self-pay | Admitting: Student

## 2021-11-17 NOTE — Progress Notes (Unsigned)
SUBJECTIVE:   CHIEF COMPLAINT / HPI:   Side Pain: Hurts when she lays on right side. Been an issue from one of her falls.   PMH  CAD - MI 2019, Ventric Bigemenig, need Card f/u DM - hgb A1c 6.6, Metformin 1000 BID, checking sugars ranging around 200's when fasting, taking at night before dinner. Notes that metformin gives her severe diarrhea.  Asthma - Advair Joint Pain - Cervical Cord Compression  / Back pain / right hip/ Bilat Knee pain /  all joints- Was taking oxycodone, but now takes tylenol. Hurts pretty bad to walk and stand.  HLD - was on crestor, not taking.  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  RLS -  Screening: CRC - No hx of colonoscopy Pap - patient had hysterectomy Mam - No hx of mammograms  P Surg Hx Galbladder, hernia repair, hysterectomy, knee replacement Bilat, cervical stenosis w/ neck surgery - surgeon said she would also need back surgery for spinal stenosis. Wrist surgery (four corner fusion).    Meds Med rec Metformin bad GI symptoms Asaparin Gabapentin 800 TID Levamir - BID 24 units Ozempic - 2 mg Lidocaine patch x 1 a day.  Tylenol -  500 mg, BID / TID   Family Hx Lung cancer and liver cancer in sister  MI in father, died from it COPD in mother   Social Living situation Lives with daughter [name] and 3 grand kids. Tammy and fam live with her for last 10 years Social security/disability for income -  for the back and leg issues No drugs, or tobacco products, no etoh   Labs:  PERTINENT  PMH / PSH: CAD, DM, HLD   OBJECTIVE:  BP (!) 113/59   Pulse 68   Ht 5\' 3"  (1.6 m)   Wt 159 lb 6 oz (72.3 kg)   SpO2 95%   BMI 28.23 kg/m   General: NAD, pleasant, able to participate in exam Cardiac: RRR, no murmurs auscultated. Respiratory: CTAB, normal effort, no wheezes, rales or rhonchi Abdomen: soft, non-tender, non-distended, normoactive bowel sounds Extremities: warm and well perfused, no edema or cyanosis. Skin: warm and dry, no rashes noted Neuro:  alert, no obvious focal deficits, speech normal Psych: Normal affect and mood  ASSESSMENT/PLAN:  Patient coming to establish care. PMH and medications were reviewed. Patient to follow up next week to discuss complaints of pain, and home sugar checks.   Health Screening Patient needing Mammogram and Colonoscopy, order's placed. Patient had hysterectomy and no longer need's PAP smears.   CAD (coronary artery disease) Patient with Hx of MI, following with Cardiology. Appointment with cardiology scheduled for 11/29/21. Patient to be seen as f/u for syncopal episode and to be receiving Zio heart monitor. Patient taking ASA 81 mg daily. -F/u w/ Cardiology  Diabetes mellitus type 2, noninsulin dependent (HCC) Patient last HgbA1c 6.6 10/05/21. Patient reporting diarrhea with metformin use, continuing to take levimir. Reports sugars ranging in the 200's at home, but patient hasn't been monitoring last few days, because battery died on glucose monitor. Will stop metformin for now with short follow up with patient to see at home sugars. Will reassess need for metformin at lower dose, after trending sugars. Patient taking gabapentin 400 mg TID for neuropathy.  -Continue levemir 24 units daily -Hold metformin -Continue Ozempic 2 mg weekly  Hyperlipidemia Patient restarted on Crestor. Patient LDL 91, not at goal of < 70 on lipid panel 10/05/21. Will reassess need for dose change in future visit.  -Crestor 20 mg  daily   Chronic joint pain Patient increased to 1,000 mg Tylenol TID for joint pain. Will have close follow up and reassess need for NSAID's. Attempting to avoid opoid's for hx of poly pharmacy and age.  -Tyl 1,000 mg TID   Orders Placed This Encounter  Procedures   MM Digital Screening    Standing Status:   Future    Standing Expiration Date:   11/27/2022    Order Specific Question:   Reason for Exam (SYMPTOM  OR DIAGNOSIS REQUIRED)    Answer:   Screening    Order Specific Question:   Preferred  imaging location?    Answer:   [facility]   Ambulatory referral to Gastroenterology    Referral Priority:   Routine    Referral Type:   Consultation    Referral Reason:   Specialty Services Required    Number of Visits Requested:   1   Meds ordered this encounter  Medications   rosuvastatin (CRESTOR) 20 MG tablet    Sig: Take 1 tablet (20 mg total) by mouth daily.    Dispense:  30 tablet    Refill:  0   Semaglutide, 2 MG/DOSE, (OZEMPIC, 2 MG/DOSE,) 8 MG/3ML SOPN    Sig: Inject 2 mg into the skin every Saturday.    Dispense:  3 mL    Refill:  3   No follow-ups on file. @SIGNNOTE @

## 2021-11-17 NOTE — Patient Instructions (Addendum)
It was great to see you! Thank you for allowing me to participate in your care!  I recommend that you always bring your medications to each appointment as this makes it easy to ensure we are on the correct medications and helps [place] not miss when refills are needed.  Our plans for today:  - Make follow up appointment in 1-2 weeks, to discuss health care screenings, your home sugars, and side pain. Monitor sugars/record and bring in for review (AM before food, evening before meal)  - Tylenol for pain  Can take 3,000 mg a day. Try 1000 mg three times a day  Will consider adding another pain med at next visit, if tylenol not working - Stop Metformin - Refilled Ozempic - Start Crestor 20 mg - Health Screening  Colonoscopy  Mammogram  Pap smear in future   Take care and seek immediate care sooner if you develop any concerns.   Dr. Bess Kinds, MD [facility]

## 2021-11-23 ENCOUNTER — Other Ambulatory Visit: Payer: Self-pay | Admitting: Student

## 2021-11-23 ENCOUNTER — Other Ambulatory Visit: Payer: Self-pay

## 2021-11-23 ENCOUNTER — Ambulatory Visit (INDEPENDENT_AMBULATORY_CARE_PROVIDER_SITE_OTHER): Payer: Medicare Other | Admitting: Student

## 2021-11-23 ENCOUNTER — Encounter: Payer: Self-pay | Admitting: Student

## 2021-11-23 VITALS — BP 113/59 | HR 68 | Ht 63.0 in | Wt 159.4 lb

## 2021-11-23 DIAGNOSIS — Z1231 Encounter for screening mammogram for malignant neoplasm of breast: Secondary | ICD-10-CM

## 2021-11-23 DIAGNOSIS — Z7689 Persons encountering health services in other specified circumstances: Secondary | ICD-10-CM | POA: Diagnosis not present

## 2021-11-23 DIAGNOSIS — G8929 Other chronic pain: Secondary | ICD-10-CM

## 2021-11-23 DIAGNOSIS — E785 Hyperlipidemia, unspecified: Secondary | ICD-10-CM

## 2021-11-23 DIAGNOSIS — Z1211 Encounter for screening for malignant neoplasm of colon: Secondary | ICD-10-CM

## 2021-11-23 DIAGNOSIS — I2510 Atherosclerotic heart disease of native coronary artery without angina pectoris: Secondary | ICD-10-CM

## 2021-11-23 DIAGNOSIS — M2550 Pain in unspecified joint: Secondary | ICD-10-CM | POA: Diagnosis not present

## 2021-11-23 DIAGNOSIS — E119 Type 2 diabetes mellitus without complications: Secondary | ICD-10-CM

## 2021-11-23 MED ORDER — OZEMPIC (2 MG/DOSE) 8 MG/3ML ~~LOC~~ SOPN: 2.0000 mg | PEN_INJECTOR | SUBCUTANEOUS | 3 refills | Status: DC

## 2021-11-23 MED ORDER — ROSUVASTATIN CALCIUM 20 MG PO TABS: 20.0000 mg | ORAL_TABLET | Freq: Every day | ORAL | 0 refills | Status: DC

## 2021-11-26 ENCOUNTER — Encounter: Payer: Self-pay | Admitting: Student

## 2021-11-26 DIAGNOSIS — E785 Hyperlipidemia, unspecified: Secondary | ICD-10-CM | POA: Insufficient documentation

## 2021-11-26 DIAGNOSIS — G2581 Restless legs syndrome: Secondary | ICD-10-CM | POA: Insufficient documentation

## 2021-11-26 NOTE — Assessment & Plan Note (Addendum)
Patient last HgbA1c 6.6 10/05/21. Patient reporting diarrhea with metformin use, continuing to take levimir. Reports sugars ranging in the 200's at home, but patient hasn't been monitoring last few days, because battery died on glucose monitor. Will stop metformin for now with short follow up with patient to see at home sugars. Will reassess need for metformin at lower dose, after trending sugars. Patient taking gabapentin 400 mg TID for neuropathy.  -Continue levemir 24 units daily -Hold metformin -Continue Ozempic 2 mg weekly

## 2021-11-26 NOTE — Assessment & Plan Note (Addendum)
Patient with Hx of MI, following with Cardiology. Appointment with cardiology scheduled for 11/29/21. Patient to be seen as f/u for syncopal episode and to be receiving Zio heart monitor. Patient taking ASA 81 mg daily. -F/u w/ Cardiology

## 2021-11-26 NOTE — Assessment & Plan Note (Addendum)
Patient restarted on Crestor. Patient LDL 91, not at goal of < 70 on lipid panel 10/05/21. Will reassess need for dose change in future visit.  -Crestor 20 mg daily

## 2021-11-26 NOTE — Assessment & Plan Note (Signed)
Patient increased to 1,000 mg Tylenol TID for joint pain. Will have close follow up and reassess need for NSAID's. Attempting to avoid opoid's for hx of poly pharmacy and age.  -Tyl 1,000 mg TID

## 2021-11-29 ENCOUNTER — Ambulatory Visit: Payer: Medicare Other | Admitting: Cardiology

## 2021-12-05 ENCOUNTER — Encounter: Payer: Self-pay | Admitting: Student

## 2021-12-05 ENCOUNTER — Ambulatory Visit (INDEPENDENT_AMBULATORY_CARE_PROVIDER_SITE_OTHER): Payer: Medicare Other | Admitting: Student

## 2021-12-05 VITALS — BP 126/82 | HR 91 | Ht 63.0 in | Wt 160.4 lb

## 2021-12-05 DIAGNOSIS — G8929 Other chronic pain: Secondary | ICD-10-CM

## 2021-12-05 DIAGNOSIS — E119 Type 2 diabetes mellitus without complications: Secondary | ICD-10-CM

## 2021-12-05 DIAGNOSIS — M2550 Pain in unspecified joint: Secondary | ICD-10-CM | POA: Diagnosis not present

## 2021-12-05 NOTE — Progress Notes (Unsigned)
SUBJECTIVE:   CHIEF COMPLAINT / HPI:   Follow up joint pain and DM  RLS/Neuropathic pain: Worse at night, always present, noticing that she's on half the gabapentin that used to help decreass the freq and symptoms. Also appreciates buring and tingling sensation in legs that is worse at night.   DM: Home sugar ranges: Around 150's Taking sugars BID in the am and PM, before first meal in am, and 1 hr after last meal at night.  Fasting sugars: 150 this morning  Meds: Levimir 24 units daily, ozempic 2 mg weekly  Chronic pain: Has tried increasing tylenol to 2 tablets every 6 hours, 500 mg tablets. Has tried topical gels and they last only an hour.  Symptoms with activity: Worse with walking  Symptoms: Pain location: Meds: Tylenol 1,000 mg TID Med freq:   Right sided kidney bruise: Told in the hospital that she had a bruise on her kideny from when she fell. Appreciates that is she rolls over on right side, she has pain. Pain located in the hip area/flank/side. Appreciate that it feels like intenese soarness. Pain located in lower back and right hip when walking/standing from seated. Last as long as she's on her right side, relived by changing side's/positions. Has seen a PT for this pain and said that things hurt worse with them. Saw them twice in a week for 2 weeks. Joint pain is located in neck, lower back, hips, and both knees. Pain is worst in the morning and more tolerable as the day goes on. Hips and low back are a bigger issue for pain, than the neck and the knees.    PERTINENT  PMH / PSH: DM, Joint pain  Past Medical History:  Diagnosis Date   Anxiety    Arthritis    Asthma    Depression    Dysrhythmia    History of GI bleed    2010--- UPPER ESOPHAGITIS/ DUODERAL ULCER/ EROSION   History of hiatal hernia    History of kidney stones    History of shingles    2013   Joint pain    Myocardial infarction (HCC) 10 years ago    Nephrolithiasis    BILATERAL   Right  ureteral stone    Type 2 diabetes mellitus (HCC)    Urgency of urination     Past Surgical History:  Procedure Laterality Date   ANTERIOR CERVICAL DECOMP/DISCECTOMY FUSION N/A 10/20/2020   Procedure: C5-6, C6-7 ANTERIOR CERVICAL DISCECTOMY, C6 CORPECTOMY, C5-C7 PLATE, ALLOGRAFT;  Surgeon: Kerrin Champagne, MD;  Location: [facility] OR;  Service: Orthopedics;  Laterality: N/A;   CARDIAC CATHETERIZATION  02-10-2009  DR BENSIMHON   NORMAL CORONARIES/  LOW NORMAL LVF WITHOUT WALL MOTION ABNORMALITIES   CARDIAC CATHETERIZATION  11-21-2010  DR Jacinto Halim   NORMAL CORONARIES/  EF 50-55%   CHOLECYSTECTOMY  1990s   back in [place]    CYSTOSCOPY W/ URETERAL STENT PLACEMENT Right 09/04/2013   Procedure: CYSTOSCOPY WITH RETROGRADE PYELOGRAM/URETERAL STENT PLACEMENT;  Surgeon: Milford Cage, MD;  Location: [facility];  Service: Urology;  Laterality: Right;   CYSTOSCOPY WITH RETROGRADE PYELOGRAM, URETEROSCOPY AND STENT PLACEMENT Right 09/27/2013   Procedure: CYSTOSCOPY WITH RETROGRADE PYELOGRAM, URETEROSCOPY AND STENT EXCHANGE;  Surgeon: Milford Cage, MD;  Location: [facility];  Service: Urology;  Laterality: Right;   HOLMIUM LASER APPLICATION Right 09/27/2013   Procedure: HOLMIUM LASER APPLICATION;  Surgeon: Milford Cage, MD;  Location: [facility];  Service: Urology;  Laterality: Right;  HYSTEROSCOPY WITH D & C  01-25-2003   IR URETERAL STENT RIGHT NEW ACCESS W/O SEP NEPHROSTOMY CATH  07/08/2017   LAPAROSCOPIC ASSISTED VAGINAL HYSTERECTOMY  03-02-2003   W/  BILATERAL SALPINGOOPHORECTOMY   LAPAROSCOPY  EXTENSIVE LYSIS ADHESIONS/  REDO PARAESOPHAGEAL HIATAL HERNIA WITH PRIMARY CLOSURE AND MESH/ NISSEN FUNDOPLICATION (1cm)/  REPAIR GASTROTOMY  08-22-2009   NEPHROLITHOTOMY Right 07/08/2017   Procedure: NEPHROLITHOTOMY PERCUTANEOUS;  Surgeon: Jerilee Field, MD;  Location: [facility];  Service: Urology;  Laterality: Right;   NISSEN FUNDOPLICATION  2000   W/  CHOLECYSTECTOMY    SHOULDER ARTHROSCOPY WITH SUBACROMIAL DECOMPRESSION, ROTATOR CUFF REPAIR AND BICEP TENDON REPAIR Left 08-02-2010   AND LABRAL DEBRIDEMENT   TOTAL KNEE ARTHROPLASTY Right 07-01-2006   TOTAL KNEE ARTHROPLASTY Left 06/08/2020   Procedure: LEFT TOTAL KNEE ARTHROPLASTY;  Surgeon: Cammy Copa, MD;  Location: [facility];  Service: Orthopedics;  Laterality: Left;   TOTAL KNEE REVISION  02/25/2012   Procedure: TOTAL KNEE REVISION;  Surgeon: Cammy Copa, MD;  Location: [facility] OR;  Service: Orthopedics;  Laterality: Right;  Revise right total knee arthroplasty   TUBAL LIGATION  1990   WRIST FUSION Left 2004   RETAINED HARDWARE    OBJECTIVE:  There were no vitals taken for this visit.  General: NAD, pleasant, able to participate in exam Cardiac: RRR, no murmurs auscultated. Respiratory: CTAB, normal effort, no wheezes, rales or rhonchi Abdomen: soft, non-tender, non-distended, normoactive bowel sounds Extremities: warm and well perfused, no edema or cyanosis. Skin: warm and dry, no rashes noted Neuro: alert, no obvious focal deficits, speech normal Psych: Normal affect and mood  ASSESSMENT/PLAN:  No problem-specific Assessment & Plan notes found for this encounter.   No orders of the defined types were placed in this encounter.  No orders of the defined types were placed in this encounter.  No follow-ups on file. @SIGNNOTE @ {    This will disappear when note is signed, click to select method of visit    :1}

## 2021-12-05 NOTE — Patient Instructions (Addendum)
It was great to see you! Thank you for allowing me to participate in your care!  I recommend that you always bring your medications to each appointment as this makes it easy to ensure we are on the correct medications and helps [place] not miss when refills are needed.  Our plans for today:  - Diabetes -Log sugars three times a day   - Morning sugar before eating   - 2 hours after lunch   - 2 hours after dinner  -Gabapentin 800 mg, three times a day (every 8 hours as needed)  - Joint/Back pain I'm worried the joint pain is coming from the stenosis in your lower back. It may be time to follow up with your orthopedist   -Make appointment with Ortho Care: Phone: [phone]   -Ibuprofen take 800 mg as needed up to three times a day, every 8 hours.   -Continue tylenol 1000 mg three times a day, every 8 hours.     -PT will also place a referral for PT to develop exercises and balance with walking   -Please, Use your walker/cane. This will help prevent from further injury, and protect against falls  Follow up in 1-2 weeks if no improvement in joint pain  -May consider a pain specialist if not improving  Take care and seek immediate care sooner if you develop any concerns.   Dr. Bess Kinds, MD [facility]

## 2021-12-11 NOTE — Assessment & Plan Note (Addendum)
Patient continues to complain of chronic joint pain, not treated by tylenol. Pain located in lower back and right hip. Full ROM in right hip w/o pain, making etiology likely referred pain from lower back. Patient has history of lumbar stenosis and would benefit from follow up with orthopedist, who patient notes had recommended follow up for back issues. Will also trial NSAID for pain control. If pain continues to be poorly controlled, may refer patient to chronic pain specialist. Will also recommend patient see PT for issues with balance and walking. Encouraged patient to use walker/cane when walking. -Follow up with othropedist -Continue tylenol 1000 mg TID -Ibuprofen 800 mg PRN TID -PT referral  -Use walker/cane

## 2021-12-11 NOTE — Assessment & Plan Note (Addendum)
Patient reports sugras in the 150's range. Will encourage patient to bring log of sugars for next appoitnment. Will encourage CBG check first thing in am for fasting, and then 2 hours after eating for post prandial.  -Document CBG checks -Levimir 24 units daily -Ozempic 2g weekly

## 2021-12-14 NOTE — Progress Notes (Signed)
  SUBJECTIVE:   CHIEF COMPLAINT / HPI:   F/u DM and chronic joint pain  DM: Fasting: 150-180's CBG range: 150-260's Sympt Hypogly: No hypolgycemia, but get's headache and blurred vision with high sugars. Meds: Levimir 24 units q day, Ozempic 2 mg q wk  Chronic Joint pain: Location: Back and hip pain, will call orthopedist to follow up. Pain happening while sitting, and worse with walking. Using rollator when walking. Sometimes not using in house, encouraged patient to use whenever walking. Wanting home health PT because she watches grand kids during the day. Laying down helps, but movement acticates the pain. Meds: Tyl 1g TID, Ibuprofen 800 mg TID PT F/u: Patient referred to PT at last visit.   PERTINENT  PMH / PSH: Chronic Joint pain, DM   OBJECTIVE:  BP 124/68   Pulse 99   Ht 5\' 3"  (1.6 m)   Wt 72.6 kg   SpO2 99%   BMI 28.34 kg/m   General: NAD, pleasant, able to participate in exam Physical Exam Constitutional:      Appearance: Normal appearance.  Musculoskeletal:     Lumbar back: Tenderness present. No swelling, edema, deformity or spasms.     Right hip: Normal. No deformity or tenderness. Normal range of motion.     Left hip: Normal. No deformity or tenderness. Normal range of motion.     Comments: Straight leg raise past 45 degress bilaterally  Neurological:     Mental Status: She is alert.       ASSESSMENT/PLAN:   Chronic joint pain Patient continues to have low back/hip pain and issues with balance, despite use of tylenol and ibuprofen. Patient would benefit from PT. Patient also reports using rollator to get around, but did not bring to her appointment. Encouraged patient to use cane/walker/rollator whenever ambulating. Paitient planning to reach out to orthopedics for follow up on lumbar back pain. Pain pain poorly controlled, and localized to lower back, no TTP or pain when ranging hips. Patient has history of seeing pain specialist, who may have been source  of polypharmacy in past. Patient agreeable to trying chronic pain specialist again, if different from previous. Patient has lidocaine patches at home, will trial lidocaine patch to assess for pain relief.  -Schedule appointment with Orthopedist -Referral to Pain Management -Referral to PT -Tylenol 1000 mg q8h -Ibuprofen 800 mg TID prn -Consider lidocaine patches at next visit  Diabetes mellitus type 2, noninsulin dependent (HCC) Patient sugars are mildly controlled with few readings above 200, most reading's above 150's. Patient fasting sugars range from 150-180's. No symptoms or recordings concerning for hypoglycemia. Will increases Levimir to 30 units daily. Encouraged patient to continue to monitor sugars. Patient taking gabapentin 800 mg, last refill was at 400 mg but should have been 800 mg. Encouraged patient to take 800 mg worth. Expect early Rx refill.  -Levimir 30 units daily -Ozempirc 2 mg wk -f/u 1 week   Orders Placed This Encounter  Procedures   Ambulatory referral to Pain Clinic    Referral Priority:   Routine    Referral Type:   Consultation    Referral Reason:   Specialty Services Required    Requested Specialty:   Pain Medicine    Number of Visits Requested:   1   No orders of the defined types were placed in this encounter.  No follow-ups on file. @SIGNNOTE @

## 2021-12-14 NOTE — Patient Instructions (Incomplete)
It was great to see you! Thank you for allowing me to participate in your care!  Our plans for today:  - F/u with orthopedist - Chronic pain:  -Use walker in home/whenever you are walking  -Try Lidoderm patch for pain, that you have at home  -Pain specialist   -PT referral  - Diabetes  -Increase Levimir to 30 units daily  -Watch for signs of low sugar (tired, dizzy, confused, sweaty, shaky)  -Follow up in 2 weeks  -Gabapentin - complete bottle you have, take 800 mg, I will refill when time  Take care and seek immediate care sooner if you develop any concerns.   Dr. Bess Kinds, MD [facility]

## 2021-12-18 ENCOUNTER — Other Ambulatory Visit: Payer: Self-pay | Admitting: Student

## 2021-12-19 ENCOUNTER — Encounter: Payer: Self-pay | Admitting: Student

## 2021-12-19 ENCOUNTER — Ambulatory Visit (INDEPENDENT_AMBULATORY_CARE_PROVIDER_SITE_OTHER): Payer: Medicare Other | Admitting: Student

## 2021-12-19 VITALS — BP 124/68 | HR 99 | Ht 63.0 in | Wt 160.0 lb

## 2021-12-19 DIAGNOSIS — M2550 Pain in unspecified joint: Secondary | ICD-10-CM | POA: Diagnosis not present

## 2021-12-19 DIAGNOSIS — E119 Type 2 diabetes mellitus without complications: Secondary | ICD-10-CM

## 2021-12-19 DIAGNOSIS — G8929 Other chronic pain: Secondary | ICD-10-CM | POA: Diagnosis not present

## 2021-12-22 ENCOUNTER — Other Ambulatory Visit: Payer: Self-pay | Admitting: Student

## 2021-12-22 NOTE — Assessment & Plan Note (Addendum)
Patient sugars are mildly controlled with few readings above 200, most reading's above 150's. Patient fasting sugars range from 150-180's. No symptoms or recordings concerning for hypoglycemia. Will increases Levimir to 30 units daily. Encouraged patient to continue to monitor sugars. Patient taking gabapentin 800 mg, last refill was at 400 mg but should have been 800 mg. Encouraged patient to take 800 mg worth. Expect early Rx refill.  -Levimir 30 units daily -Ozempirc 2 mg wk -f/u 1 week

## 2021-12-22 NOTE — Assessment & Plan Note (Addendum)
Patient continues to have low back/hip pain and issues with balance, despite use of tylenol and ibuprofen. Patient would benefit from PT. Patient also reports using rollator to get around, but did not bring to her appointment. Encouraged patient to use cane/walker/rollator whenever ambulating. Paitient planning to reach out to orthopedics for follow up on lumbar back pain. Pain pain poorly controlled, and localized to lower back, no TTP or pain when ranging hips. Patient has history of seeing pain specialist, who may have been source of polypharmacy in past. Patient agreeable to trying chronic pain specialist again, if different from previous. Patient has lidocaine patches at home, will trial lidocaine patch to assess for pain relief.  -Schedule appointment with Orthopedist -Referral to Pain Management -Referral to PT -Tylenol 1000 mg q8h -Ibuprofen 800 mg TID prn -Consider lidocaine patches at next visit

## 2021-12-24 ENCOUNTER — Other Ambulatory Visit: Payer: Self-pay | Admitting: Physical Medicine and Rehabilitation

## 2022-01-15 ENCOUNTER — Ambulatory Visit (INDEPENDENT_AMBULATORY_CARE_PROVIDER_SITE_OTHER): Payer: Medicare Other | Admitting: Student

## 2022-01-15 ENCOUNTER — Encounter: Payer: Self-pay | Admitting: Student

## 2022-01-15 ENCOUNTER — Other Ambulatory Visit: Payer: Self-pay

## 2022-01-15 VITALS — BP 136/64 | HR 72 | Wt 163.6 lb

## 2022-01-15 DIAGNOSIS — G8929 Other chronic pain: Secondary | ICD-10-CM | POA: Diagnosis not present

## 2022-01-15 DIAGNOSIS — E119 Type 2 diabetes mellitus without complications: Secondary | ICD-10-CM

## 2022-01-15 DIAGNOSIS — M2550 Pain in unspecified joint: Secondary | ICD-10-CM

## 2022-01-15 DIAGNOSIS — Z0000 Encounter for general adult medical examination without abnormal findings: Secondary | ICD-10-CM

## 2022-01-15 LAB — POCT GLYCOSYLATED HEMOGLOBIN (HGB A1C): HbA1c, POC (controlled diabetic range): 8.7 % — AB (ref 0.0–7.0)

## 2022-01-15 MED ORDER — BOOSTRIX 5-2.5-18.5 LF-MCG/0.5 IM SUSP: 0.5000 mL | Freq: Once | INTRAMUSCULAR | 0 refills | Status: AC

## 2022-01-15 MED ORDER — DAPAGLIFLOZIN PROPANEDIOL 5 MG PO TABS: 5.0000 mg | ORAL_TABLET | Freq: Every day | ORAL | 0 refills | Status: DC

## 2022-01-15 MED ORDER — GABAPENTIN 800 MG PO TABS: 800.0000 mg | ORAL_TABLET | Freq: Three times a day (TID) | ORAL | 0 refills | Status: DC

## 2022-01-15 NOTE — Patient Instructions (Addendum)
It was great to see you! Thank you for allowing me to participate in your care!  I recommend that you always bring your medications to each appointment as this makes it easy to ensure we are on the correct medications and helps [place] not miss when refills are needed.  Our plans for today:  - Diabetes  Start Marcelline Deist, take in am before breakfast  Continue taking levimir twice a day. Once in am and once in afternoon.   Come to [facility] clinic for urine sample Hgb A1c 8.7 today  -Chronic Pain    Continue taking Tylenol 1000 mg (500 mg twice) three times a day (every 8 hours) Continue taking Ibuprofen 800 mg three times a day (every 8 hours, 4 hours after tylenol, as to stagger taking the medications) Follow up with Ortho  Health Maintenance  Checking for Hep C  Tetanus vaccine prescription printed out, go to Pharmacy  Mammogram at [facility]   Call [phone]  Referral placed for colonoscopy  We are checking some labs today, I will call you if they are abnormal will send you a MyChart message or a letter if they are normal.  If you do not hear about your labs in the next 2 weeks please let us know.  Take care and seek immediate care sooner if you develop any concerns.   Dr. Bess Kinds, MD [facility]

## 2022-01-15 NOTE — Assessment & Plan Note (Signed)
Patient reports that pain is still an issues, but has been using Rolator more frequently and feels good about it. Does not report any leg weakness. Patient had car issues preventing her from making appointment with orothpedist, car is now fixed an patient intends to make appointment.  -F/u with orthopedist -Tyl 1g TID -Ibuprofen 800 mg TID

## 2022-01-15 NOTE — Assessment & Plan Note (Addendum)
Patient A1c 8.7 today, was 6.6 10/05/21. Patient continues to take levemir and ozempic. Will recommend starting SGLT2, farxiga, for better sugar control and for cardiovascular risk benefits. Patient foot exam normal, but patient is noticing decreased sensation on bottom of foot at home when standing. Will also place order for eye exam -Levimir 30 units -Ozempic 2 mg -Farxiga 5 mg -f/u in 6 weeks -Ophto referral -Microalbumin

## 2022-01-15 NOTE — Progress Notes (Addendum)
SUBJECTIVE:   CHIEF COMPLAINT / HPI:   F/u DM and chronic pain  DM Meds: Levimir 30 units, Ozempic 2 mg weekly Taking levimir BID. Taking it first thing in morning and before going to bed.  CBG range: 124 - 250, mostly in the 160-180's in the mornings and 220's in the evenings.  Hypoglycemia/symptoms: No symptoms of hypoglycemia or polydipsia.  HgbA1c: 6.6 on 10/05/21,  Lab Results  Component Value Date   HGBA1C 8.7 (A) 01/15/2022  Foot exam: Normal Exam Ophtho exam: Order's placed Urine microalbumin: Orders placed  Chronic pain Meds: Tylenol 1000 mg BID, Ibuprofen 800 mg BID, lidocaine patch Symptoms: Pain in back and hip, and down right leg. Lidocaine patches didn't help, worn for most of the day. Hasn't made it to PT because of car issues. Has called ortho docotors but car issues prevented her from setting up an appointment. Patient car now fixed and feels able to get in with her ortho specialist.   Health Mait:  Colonoscopy - open to scheduling Mammogram - open to getting Tdap - open to getting Hep C - open to getting  PERTINENT  PMH / PSH: DM, Chronic pain   OBJECTIVE:  BP 136/64   Pulse 72   Wt 163 lb 9.6 oz (74.2 kg)   SpO2 100%   BMI 28.98 kg/m   General: NAD, pleasant, able to participate in exam Cardiac: RRR, no murmurs auscultated. Respiratory: CTAB, normal effort, no wheezes, rales or rhonchi Abdomen: soft, non-tender, non-distended, normoactive bowel sounds Physical Exam Musculoskeletal:     Right hip: No tenderness. Normal strength.     Left hip: No tenderness. Normal strength.     Right upper leg: Normal.     Left upper leg: Normal.  Feet:     Right foot:     Protective Sensation: 5 sites tested.  5 sites sensed.     Skin integrity: No ulcer, blister, skin breakdown or erythema.     Toenail Condition: Right toenails are normal.     Left foot:     Protective Sensation: 5 sites tested.  5 sites sensed.     Skin integrity: No ulcer, blister, skin  breakdown or erythema.     Toenail Condition: Left toenails are normal.     ASSESSMENT/PLAN:  Diabetes mellitus type 2, noninsulin dependent (HCC) Patient A1c 8.7 today, was 6.6 10/05/21. Patient continues to take levemir and ozempic. Will recommend starting SGLT2, farxiga, for better sugar control and for cardiovascular risk benefits. Patient foot exam normal, but patient is noticing decreased sensation on bottom of foot at home when standing. Will also place order for eye exam -Levimir 30 units -Ozempic 2 mg -Farxiga 5 mg -f/u in 6 weeks -Ophto referral  Chronic joint pain Patient reports that pain is still an issues, but has been using Rolator more frequently and feels good about it. Does not report any leg weakness. Patient had car issues preventing her from making appointment with orothpedist, car is now fixed an patient intends to make appointment.  -F/u with orthopedist -Tyl 1g TID -Ibuprofen 800 mg TID   Health maintenance -Patient wanting to get colonoscopy, referral placed -Patient given instructions for getting mammogram -Patient given Rx for Tdap to take to pharmacy -Hep C testing today   Orders Placed This Encounter  Procedures   Microalbumin, urine    Standing Status:   Future    Standing Expiration Date:   01/16/2023   Hepatitis C antibody (reflex, frozen specimen)   Ambulatory referral  to Ophthalmology    Referral Priority:   Routine    Referral Type:   Consultation    Referral Reason:   Specialty Services Required    Requested Specialty:   Ophthalmology    Number of Visits Requested:   1   Ambulatory referral to Gastroenterology    Referral Priority:   Routine    Referral Type:   Consultation    Referral Reason:   Specialty Services Required    Number of Visits Requested:   1   HgB A1c   Meds ordered this encounter  Medications   Tdap (BOOSTRIX) 5-2.5-18.5 LF-MCG/0.5 injection    Sig: Inject 0.5 mLs into the muscle once for 1 dose.    Dispense:  0.5 mL     Refill:  0   dapagliflozin propanediol (FARXIGA) 5 MG TABS tablet    Sig: Take 1 tablet (5 mg total) by mouth daily before breakfast.    Dispense:  30 tablet    Refill:  0   No follow-ups on file. @SIGNNOTE @

## 2022-01-16 LAB — MICROALBUMIN, URINE: Microalbumin, Urine: 175.2 ug/mL

## 2022-01-16 LAB — HCV AB W REFLEX TO QUANT PCR: HCV Ab: NONREACTIVE

## 2022-01-16 LAB — HCV INTERPRETATION

## 2022-01-22 ENCOUNTER — Other Ambulatory Visit: Payer: Self-pay | Admitting: Student

## 2022-02-06 ENCOUNTER — Ambulatory Visit: Payer: Medicare Other | Admitting: Surgery

## 2022-02-13 ENCOUNTER — Other Ambulatory Visit: Payer: Self-pay | Admitting: Student

## 2022-02-13 ENCOUNTER — Ambulatory Visit
Admission: RE | Admit: 2022-02-13 | Discharge: 2022-02-13 | Disposition: A | Discharge status: Home / Self Care | Payer: Medicare Other | Source: Ambulatory Visit | Attending: Family Medicine | Admitting: Family Medicine

## 2022-02-13 DIAGNOSIS — Z1231 Encounter for screening mammogram for malignant neoplasm of breast: Secondary | ICD-10-CM

## 2022-02-13 DIAGNOSIS — E119 Type 2 diabetes mellitus without complications: Secondary | ICD-10-CM

## 2022-02-15 NOTE — Telephone Encounter (Signed)
PCP out of the office. Forwarding to Dr. Thompson Grayer.   Talbot Grumbling, RN

## 2022-02-15 NOTE — Telephone Encounter (Signed)
Patient LVM on nurse line x 2 for refill request. She reports that she will be completely out of medication tomorrow.   Forwarding to PCP.   Talbot Grumbling, RN

## 2022-02-20 ENCOUNTER — Other Ambulatory Visit: Payer: Self-pay | Admitting: Student

## 2022-02-20 DIAGNOSIS — E119 Type 2 diabetes mellitus without complications: Secondary | ICD-10-CM

## 2022-02-21 NOTE — Patient Instructions (Addendum)
It was great to see you! Thank you for allowing me to participate in your care!   Our plans for today:  - Follow up with Orthopedist about pins/needles sensation  Sensation may be coming from nerves exiting the back from narrowed space - Make Follow up visit with me, after you;ve seen Orthopedist - Use Capsaicin cream for sensation, can buy over the counter at pharmacy, use 2-3 times a day on affected areas   - Record morning fasting, and evening (2 hours after meal) sugars  Take care and seek immediate care sooner if you develop any concerns.   Dr. Holley Bouche, MD [place]

## 2022-02-21 NOTE — Progress Notes (Unsigned)
  SUBJECTIVE:   CHIEF COMPLAINT / HPI:   Diabetes Hgb A1c: 8.7 - 01/15/22 Meds: Ozempic 2 mg wkly, Farxiga 5mg  daily, Levimir 30 units BID Fasting: 120 today. Range 100's-200's CBG range: 100-200's  Chronic Joint Pain Meds: Tylenol 1000 mg TID, Ibuprofen 800 mg BID Symptoms: Pain in back and hip, and down right leg Has appointment with Orthopedic in October  Sleep: Going to sleep around 10-11, waking up at 2, not able to go back to sleep. All stemming from the skin pain.   Skin Pain: Feels like cuts/pricks/needles allover skin. Has been an issue for last 2 months. Appreciates that even if sheets touch her in bed, it feels awful. Denies any fevers and body aches, feeling normal state of heath. Denies any fast HR or sweating. Gabapentin helps for a little bit, but only for few hours. Pain located on arms/legs/upper part of back. Appreciates some burning as well. Pain is causing her to not sleep well.    PERTINENT  PMH / PSH: DM   OBJECTIVE:  BP 134/62   Pulse 85   Ht 5\' 3"  (1.6 m)   Wt 161 lb (73 kg)   SpO2 99%   BMI 28.52 kg/m   General: NAD, pleasant, able to participate in exam Cardiac: RRR, no murmurs auscultated. Respiratory: CTAB, normal effort, no wheezes, rales or rhonchi Abdomen: soft, non-tender, non-distended, normoactive bowel sounds Extremities: warm and well perfused, no edema or cyanosis. Skin: warm and dry, no rashes noted Neuro: alert, no obvious focal deficits, speech normal, strength 5/5 in all extremities, sensation symmetric and intact grossly in face and extremities  Psych: Normal affect and mood  ASSESSMENT/PLAN:  Diabetes mellitus type 2, noninsulin dependent ([place]) Patient reports CBG ranges have been between 100-200, with fasting 120's. Sugars better controlled, will continue DM regimen.  -Ozempic 2 mg wk -Farxiga 5 mg daily -Levimir 30 units BID -A1c in 2 months  Chronic joint pain Patient continues to have joint pain, but has appointment  with Orthopedist in October. -F/u with orthopedist  Paresthesia Patient complaining of pins/needles sensation and burning sensation on arms and legs that bothers her during the day and wakes her at night. 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 Patient has hx of spinal stenosis, with spondylosis and myelopathy, neuro exam normal, and no complaint of changes in bowel habits. It's possible this issue may be coming from her spinal stenosis, and may be best addressed with orthopedist. Will try capsaicin cream to help with symptoms.   -f/u with orthopedist -Capsaicin cream -F/u w/ [facility] provider after orthopedist    No orders of the defined types were placed in this encounter.  Meds ordered this encounter  Medications   dapagliflozin propanediol (FARXIGA) 5 MG TABS tablet    Sig: Take 1 tablet (5 mg total) by mouth daily before breakfast.    Dispense:  30 tablet    Refill:  2   rosuvastatin (CRESTOR) 20 MG tablet    Sig: Take 1 tablet (20 mg total) by mouth daily.    Dispense:  90 tablet    Refill:  1   No follow-ups on file. @SIGNNOTE @

## 2022-02-22 ENCOUNTER — Encounter: Payer: Self-pay | Admitting: Student

## 2022-02-22 ENCOUNTER — Ambulatory Visit (INDEPENDENT_AMBULATORY_CARE_PROVIDER_SITE_OTHER): Payer: Medicare Other | Admitting: Student

## 2022-02-22 VITALS — BP 134/62 | HR 85 | Ht 63.0 in | Wt 161.0 lb

## 2022-02-22 DIAGNOSIS — G8929 Other chronic pain: Secondary | ICD-10-CM

## 2022-02-22 DIAGNOSIS — E119 Type 2 diabetes mellitus without complications: Secondary | ICD-10-CM | POA: Diagnosis not present

## 2022-02-22 DIAGNOSIS — R202 Paresthesia of skin: Secondary | ICD-10-CM

## 2022-02-22 DIAGNOSIS — M2550 Pain in unspecified joint: Secondary | ICD-10-CM

## 2022-02-22 MED ORDER — DAPAGLIFLOZIN PROPANEDIOL 5 MG PO TABS: 5.0000 mg | ORAL_TABLET | Freq: Every day | ORAL | 2 refills | Status: DC

## 2022-02-22 MED ORDER — ROSUVASTATIN CALCIUM 20 MG PO TABS: 20.0000 mg | ORAL_TABLET | Freq: Every day | ORAL | 1 refills | Status: DC

## 2022-02-26 DIAGNOSIS — R202 Paresthesia of skin: Secondary | ICD-10-CM | POA: Insufficient documentation

## 2022-02-26 NOTE — Assessment & Plan Note (Addendum)
Patient complaining of pins/needles sensation and burning sensation on arms and legs that bothers her during the day and wakes her at night. 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 Patient has hx of spinal stenosis, with spondylosis and myelopathy, neuro exam normal, and no complaint of changes in bowel habits. It's possible this issue may be coming from her spinal stenosis, and may be best addressed with orthopedist. Will try capsaicin cream to help with symptoms.   -f/u with orthopedist -Capsaicin cream -F/u w/ [facility] provider after orthopedist

## 2022-02-26 NOTE — Assessment & Plan Note (Addendum)
Patient reports CBG ranges have been between 100-200, with fasting 120's. Sugars better controlled, will continue DM regimen.  -Ozempic 2 mg wk -Farxiga 5 mg daily -Levimir 30 units BID -A1c in 2 months

## 2022-02-26 NOTE — Assessment & Plan Note (Signed)
Patient continues to have joint pain, but has appointment with Orthopedist in October. -F/u with orthopedist

## 2022-03-13 ENCOUNTER — Ambulatory Visit: Payer: Medicare Other | Admitting: Specialist

## 2022-03-18 ENCOUNTER — Other Ambulatory Visit: Payer: Self-pay | Admitting: Family Medicine

## 2022-03-18 ENCOUNTER — Other Ambulatory Visit: Payer: Self-pay

## 2022-03-18 DIAGNOSIS — E119 Type 2 diabetes mellitus without complications: Secondary | ICD-10-CM

## 2022-03-18 MED ORDER — ACCU-CHEK FASTCLIX LANCET KIT: PACK | 0 refills | Status: DC

## 2022-03-18 MED ORDER — ACCU-CHEK FASTCLIX LANCETS MISC: 12 refills | Status: AC

## 2022-03-18 MED ORDER — ACCU-CHEK GUIDE VI STRP: ORAL_STRIP | 12 refills | Status: DC

## 2022-03-18 MED ORDER — ACCU-CHEK GUIDE W/DEVICE KIT: PACK | 0 refills | Status: AC

## 2022-03-18 NOTE — Telephone Encounter (Signed)
Patient calls nurse line requesting prescription for glucometer and supplies.   Pended supplies to this encounter.   Talbot Grumbling, RN

## 2022-03-21 ENCOUNTER — Encounter: Payer: Self-pay | Admitting: Specialist

## 2022-03-21 ENCOUNTER — Ambulatory Visit (INDEPENDENT_AMBULATORY_CARE_PROVIDER_SITE_OTHER): Payer: Medicare Other | Admitting: Specialist

## 2022-03-21 ENCOUNTER — Ambulatory Visit (INDEPENDENT_AMBULATORY_CARE_PROVIDER_SITE_OTHER): Payer: Medicare Other

## 2022-03-21 VITALS — BP 120/83 | HR 60 | Ht 63.0 in | Wt 164.0 lb

## 2022-03-21 DIAGNOSIS — G9589 Other specified diseases of spinal cord: Secondary | ICD-10-CM | POA: Diagnosis not present

## 2022-03-21 DIAGNOSIS — M5136 Other intervertebral disc degeneration, lumbar region: Secondary | ICD-10-CM | POA: Diagnosis not present

## 2022-03-21 DIAGNOSIS — M4317 Spondylolisthesis, lumbosacral region: Secondary | ICD-10-CM

## 2022-03-21 DIAGNOSIS — Z981 Arthrodesis status: Secondary | ICD-10-CM

## 2022-03-21 DIAGNOSIS — M4726 Other spondylosis with radiculopathy, lumbar region: Secondary | ICD-10-CM

## 2022-03-21 NOTE — Progress Notes (Signed)
Office Visit Note   Patient: [name]           Date of Birth: [birth year]           MRN: [id number] Visit Date: 03/21/2022              Requested by: Holley Bouche, MD [address] [place],  [place] [address] PCP: Holley Bouche, MD   Assessment & Plan: Visit Diagnoses:  1. Spondylolisthesis, lumbosacral region   2. Degenerative disc disease, lumbar   3. S/P cervical spinal fusion   4. Myelomalacia of cervical cord (HCC)   5. Other spondylosis with radiculopathy, lumbar region     Plan: Avoid bending, stooping and avoid lifting weights greater than 10 lbs. Avoid prolong standing and walking. Avoid frequent bending and stooping  No lifting greater than 10 lbs. May use ice or moist heat for pain. Weight loss is of benefit. Handicap license is approved. MRI of the lumbar spine as last was almost 1.5 years ago. She has lumbar radicular pain with collapsing degenerative  Disc disease involves T12-L1 to L5-S1 with severe foraminal narrowing bilateral L4-5 and L5-S1. Options include ESIs vs Spinal cord stimulator vs long T-L fusion with XLIFs vsTLIFs.    Follow-Up Instructions: No follow-ups on file.   Orders:  Orders Placed This Encounter  Procedures   XR Lumbar Spine 2-3 Views   No orders of the defined types were placed in this encounter.     Procedures: No procedures performed   Clinical Data: No additional findings.   Subjective: Chief Complaint  Patient presents with   Lower Back - Pain    66 year old female with history of low back pain with radiation into the right leg and foot. No bowel or bladder difficulty. Limits her standing and walking tolerance. Pain mainly to the right anterior thigh to the knee and also into the right top of the foot.  Pain at night time.     Review of Systems  Constitutional: Negative.   HENT: Negative.    Eyes: Negative.   Respiratory: Negative.    Cardiovascular: Negative.   Gastrointestinal: Negative.    Endocrine: Negative.   Genitourinary: Negative.   Musculoskeletal: Negative.   Skin: Negative.   Allergic/Immunologic: Negative.   Neurological: Negative.   Hematological: Negative.   Psychiatric/Behavioral: Negative.       Objective: Vital Signs: BP 120/83 (BP Location: Right Arm, Patient Position: Sitting, Cuff Size: Normal)   Pulse 60   Ht 5\' 3"  (1.6 m)   Wt 164 lb (74.4 kg)   BMI 29.05 kg/m   Physical Exam Constitutional:      Appearance: She is well-developed.  HENT:     Head: Normocephalic and atraumatic.  Eyes:     Pupils: Pupils are equal, round, and reactive to light.  Pulmonary:     Effort: Pulmonary effort is normal.     Breath sounds: Normal breath sounds.  Abdominal:     General: Bowel sounds are normal.     Palpations: Abdomen is soft.  Musculoskeletal:        General: Normal range of motion.     Cervical back: Normal range of motion and neck supple.  Skin:    General: Skin is warm and dry.  Neurological:     Mental Status: She is alert and oriented to person, place, and time.  Psychiatric:        Behavior: Behavior normal.        Thought Content:  Thought content normal.        Judgment: Judgment normal.     Ortho Exam  Specialty Comments:  No specialty comments available.  Imaging: No results found.   PMFS History: Patient Active Problem List   Diagnosis Date Noted   Cervical spondylosis with myelopathy 10/20/2020    Priority: High    Class: Chronic   Paresthesia 02/26/2022   Restless leg syndrome 11/26/2021   Hyperlipidemia 11/26/2021   Syncope 10/04/2021   Cervical cord compression with myelopathy (HCC) 10/18/2020   Spinal stenosis, cervical region 10/18/2020   Polypharmacy 10/18/2020   CAD (coronary artery disease) 10/18/2020   Weakness 10/14/2020   History of myocardial infarction 06/18/2017   Chronic joint pain 10/08/2016   Asthma 10/08/2016   Insomnia 10/08/2016   Diabetes mellitus type 2, noninsulin dependent (HCC)  09/10/2013   GERD 03/15/2009   Past Medical History:  Diagnosis Date   Anxiety    Arthritis    Asthma    Asthma 10/08/2016   Depression    Dysrhythmia    Fall at home, initial encounter 10/14/2020   History of GI bleed    2010--- UPPER ESOPHAGITIS/ DUODERAL ULCER/ EROSION   History of hiatal hernia    History of kidney stones    History of shingles    2013   Joint pain    Myocardial infarction (HCC) 10 years ago    Nephrolithiasis    BILATERAL   Right ureteral stone    S/P total knee arthroplasty, 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   Type 2 diabetes mellitus (HCC)    Urgency of urination     Family History  Problem Relation Age of Onset   Heart disease Father    Lung cancer Sister    Liver cancer Sister     Past Surgical History:  Procedure Laterality Date   ANTERIOR CERVICAL DECOMP/DISCECTOMY FUSION N/A 10/20/2020   Procedure: C5-6, C6-7 ANTERIOR CERVICAL DISCECTOMY, C6 CORPECTOMY, C5-C7 PLATE, ALLOGRAFT;  Surgeon: Kerrin Champagne, MD;  Location: [facility];  Service: Orthopedics;  Laterality: N/A;   BREAST EXCISIONAL BIOPSY Right 1983   CARDIAC CATHETERIZATION  02-10-2009  DR BENSIMHON   NORMAL CORONARIES/  LOW NORMAL LVF WITHOUT WALL MOTION ABNORMALITIES   CARDIAC CATHETERIZATION  11-21-2010  DR Jacinto Halim   NORMAL CORONARIES/  EF 50-55%   CHOLECYSTECTOMY  1990s   back in [place]    CYSTOSCOPY W/ URETERAL STENT PLACEMENT Right 09/04/2013   Procedure: CYSTOSCOPY WITH RETROGRADE PYELOGRAM/URETERAL STENT PLACEMENT;  Surgeon: Milford Cage, MD;  Location: [facility];  Service: Urology;  Laterality: Right;   CYSTOSCOPY WITH RETROGRADE PYELOGRAM, URETEROSCOPY AND STENT PLACEMENT Right 09/27/2013   Procedure: CYSTOSCOPY WITH RETROGRADE PYELOGRAM, URETEROSCOPY AND STENT EXCHANGE;  Surgeon: Milford Cage, MD;  Location: [facility];  Service: Urology;  Laterality: Right;   HOLMIUM LASER APPLICATION Right 09/27/2013   Procedure: HOLMIUM LASER APPLICATION;  Surgeon: Milford Cage, MD;  Location: [facility];  Service: Urology;  Laterality: Right;   HYSTEROSCOPY WITH D & C  01/25/2003   IR URETERAL STENT RIGHT NEW ACCESS W/O SEP NEPHROSTOMY CATH  07/08/2017   LAPAROSCOPIC ASSISTED VAGINAL HYSTERECTOMY  03/02/2003   W/  BILATERAL SALPINGOOPHORECTOMY   LAPAROSCOPY  EXTENSIVE LYSIS ADHESIONS/  REDO PARAESOPHAGEAL HIATAL HERNIA WITH PRIMARY CLOSURE AND MESH/ NISSEN FUNDOPLICATION (1cm)/  REPAIR GASTROTOMY  08/22/2009   NEPHROLITHOTOMY Right 07/08/2017   Procedure: NEPHROLITHOTOMY PERCUTANEOUS;  Surgeon: Jerilee Field, MD;  Location: [facility];  Service: Urology;  Laterality: Right;  NISSEN FUNDOPLICATION  3299   W/  CHOLECYSTECTOMY   SHOULDER ARTHROSCOPY WITH SUBACROMIAL DECOMPRESSION, ROTATOR CUFF REPAIR AND BICEP TENDON REPAIR Left 08/02/2010   AND LABRAL DEBRIDEMENT   TOTAL KNEE ARTHROPLASTY Right 07/01/2006   TOTAL KNEE ARTHROPLASTY Left 06/08/2020   Procedure: LEFT TOTAL KNEE ARTHROPLASTY;  Surgeon: Meredith Pel, MD;  Location: [facility];  Service: Orthopedics;  Laterality: Left;   TOTAL KNEE REVISION  02/25/2012   Procedure: TOTAL KNEE REVISION;  Surgeon: Meredith Pel, MD;  Location: [facility];  Service: Orthopedics;  Laterality: Right;  Revise right total knee arthroplasty   TUBAL LIGATION  1990   WRIST FUSION Left 2004   RETAINED HARDWARE   Social History   Occupational History   Not on file  Tobacco Use   Smoking status: Never   Smokeless tobacco: Never  Vaping Use   Vaping Use: Never used  Substance and Sexual Activity   Alcohol use: No   Drug use: No   Sexual activity: Not on file    Comment: Hysterectomy

## 2022-03-21 NOTE — Patient Instructions (Signed)
Plan: Avoid bending, stooping and avoid lifting weights greater than 10 lbs. Avoid prolong standing and walking. Avoid frequent bending and stooping  No lifting greater than 10 lbs. May use ice or moist heat for pain. Weight loss is of benefit. Handicap license is approved. MRI of the lumbar spine as last was almost 1.5 years ago. She has lumbar radicular pain with collapsing degenerative  Disc disease involves T12-L1 to L5-S1 with severe foraminal narrowing bilateral L4-5 and L5-S1. Options include ESIs vs Spinal cord stimulator vs long T-L fusion with XLIFs vsTLIFs.

## 2022-04-09 ENCOUNTER — Telehealth: Payer: Self-pay

## 2022-04-09 NOTE — Telephone Encounter (Signed)
Patient calls nurse line regarding supply issues with Ozempic. [facility] and she has not been able to find at any other local pharmacies. She is asking if there is an alternative that she could try.   Will forward to PCP.   Veronda Prude, RN

## 2022-04-11 ENCOUNTER — Ambulatory Visit: Payer: Medicare Other | Admitting: Orthopedic Surgery

## 2022-04-11 ENCOUNTER — Other Ambulatory Visit: Payer: Self-pay | Admitting: Student

## 2022-04-11 DIAGNOSIS — E119 Type 2 diabetes mellitus without complications: Secondary | ICD-10-CM

## 2022-04-12 ENCOUNTER — Other Ambulatory Visit: Payer: Self-pay | Admitting: Student

## 2022-04-12 MED ORDER — TRULICITY 3 MG/0.5ML ~~LOC~~ SOAJ: 3.0000 mg | SUBCUTANEOUS | 2 refills | Status: DC

## 2022-04-13 ENCOUNTER — Ambulatory Visit
Admission: RE | Admit: 2022-04-13 | Discharge: 2022-04-13 | Disposition: A | Discharge status: Home / Self Care | Payer: Medicare Other | Source: Ambulatory Visit | Attending: Specialist | Admitting: Specialist

## 2022-04-13 DIAGNOSIS — M4317 Spondylolisthesis, lumbosacral region: Secondary | ICD-10-CM

## 2022-04-13 DIAGNOSIS — M5136 Other intervertebral disc degeneration, lumbar region: Secondary | ICD-10-CM

## 2022-04-13 DIAGNOSIS — M4726 Other spondylosis with radiculopathy, lumbar region: Secondary | ICD-10-CM

## 2022-04-13 DIAGNOSIS — Z981 Arthrodesis status: Secondary | ICD-10-CM

## 2022-04-25 ENCOUNTER — Ambulatory Visit: Payer: Medicare Other | Admitting: Orthopedic Surgery

## 2022-05-02 ENCOUNTER — Ambulatory Visit (INDEPENDENT_AMBULATORY_CARE_PROVIDER_SITE_OTHER): Payer: Medicare Other | Admitting: Orthopedic Surgery

## 2022-05-02 ENCOUNTER — Other Ambulatory Visit: Payer: Self-pay | Admitting: Physical Medicine and Rehabilitation

## 2022-05-02 ENCOUNTER — Ambulatory Visit (INDEPENDENT_AMBULATORY_CARE_PROVIDER_SITE_OTHER): Payer: Medicare Other

## 2022-05-02 VITALS — BP 111/50 | HR 42 | Ht 63.0 in | Wt 164.0 lb

## 2022-05-02 DIAGNOSIS — M4317 Spondylolisthesis, lumbosacral region: Secondary | ICD-10-CM | POA: Diagnosis not present

## 2022-05-02 DIAGNOSIS — M5416 Radiculopathy, lumbar region: Secondary | ICD-10-CM | POA: Diagnosis not present

## 2022-05-02 NOTE — Progress Notes (Signed)
Orthopedic Spine Surgery Office Note  Assessment: Patient is a 66 y.o. female with low back pain that radiates into the right lateral and anterior aspect of the thigh   Plan: -Explained that initially conservative treatment is tried as a significant number of patients may experience relief with these treatment modalities. Discussed that the conservative treatments include:  -activity modification  -physical therapy  -over the counter pain medications  -medrol dosepak  -lumbar steroid injections -Patient has tried NSAIDs, Tylenol, physical therapy, activity modification -Would need A1c under 7.5 before any surgical intervention is offered -She has symptoms of hand clumsiness and unsteadiness with gait.  These were present prior to her ACDF and Dr. Otelia Sergeant performed the surgery for myelopathy.  I explained to her that the goal of myelopathy surgery is to prevent progression and not reverse any prior symptoms.  I explained I will continue to monitor her for new symptoms of myelopathy at which point we would investigate that further -Recommended right-sided L4-5 and L5-S1 transforaminal injections.  A referral was provided to Dr. Alvester Morin for these injections -Patient should return to office in 6 weeks, repeat x-rays of lumbar spine at next visit: none   Patient expressed understanding of the plan and all questions were answered to the patient's satisfaction.   ___________________________________________________________________________   History:  Patient is a 66 y.o. female who presents today for lumbar spine.  Patient has had chronic history of low back pain for the last couple of years.  There is no trauma or injury that brought on the pain.  She has noticed that the pain radiates into her right leg.  She feels it goes on the lateral aspect and anterior aspect of the right thigh.  It does not go past the knee.  She has no symptoms on the left side.  The pain in the leg has started within the  last year.  Pain has been getting progressively worse with time.  She feels it is worse with activity and improves with rest but does not completely go away.  Has paresthesias and numbness in her feet from neuropathy but no new paresthesias and numbness.   Weakness: Denies Symptoms of imbalance: yes, had before ACDF and the symptoms have remained but have not worsened Paresthesias and numbness: Yes, bilateral feet from neuropathy Bowel or bladder incontinence: Denies Saddle anesthesia: Denies  Treatments tried: NSAIDs, Tylenol, physical therapy, activity modification  Review of systems: Denies fevers and chills, night sweats, unexplained weight loss, history of cancer, pain that wakes them at night  Past medical history: Depression Anxiety GERD Neuropathy Diabetes (last A1c was 8.7 on 01/15/2022) Chronic pain Asthma History of kidney stones  Allergies: NKDA  Past surgical history:  Knee replacement Hernia repair C5-7 ACDF for myelopathy Breast excisional biopsy Cholecystectomy Hysterectomy Nissen fundoplication Lithotripsy Rotator cuff repair on the left side Left wrist fusion  Social history: Denies use of nicotine product (smoking, vaping, patches, smokeless) Alcohol use: Denies Denies recreational drug use   Physical Exam:  General: no acute distress, appears stated age Neurologic: alert, answering questions appropriately, following commands Respiratory: unlabored breathing on room air, symmetric chest rise Psychiatric: appropriate affect, normal cadence to speech   MSK (spine):  -Strength exam      Left  Right EHL    4/5  4/5 TA    5/5  5/5 GSC    5/5  5/5 Knee extension  5/5  5/5 Hip flexion   5/5  5/5  -Sensory exam    Sensation intact  to light touch in L3-S1 nerve distributions of bilateral lower extremities  -Positive Hoffmann bilaterally -Achilles DTR: 2/4 on the left, 2/4 on the right -Patellar tendon DTR: 1/4 on the left, 1/4 on the  right  -Straight leg raise: negative -Contralateral straight leg raise: negative -Clonus: no beats bilaterally  -Left hip exam: no pain through range of motion, negative stinchfield -Right hip exam: no pain through range of motion, negative stinchfield  Imaging: XR of the lumbar spine from 05-02-2022, 03/21/2022 was independently reviewed and interpreted, showing spondylolisthesis at L5-S1 that shifts 1 mm between flexion and extension.  Disc height loss at multiple levels throughout the lumbar spine.  No fracture seen. Pit of 48, LL of 45.   MRI of the lumbar spine from  was independently reviewed and interpreted, showing DDD at multiple levels.  Spondylolisthesis at L5-S1.  Central lateral recess stenosis at L4-5, lateral recess stenosis at L5-S1.  Bilateral foraminal stenosis at L4-5 and L5-S1   Patient name: [name] Patient MRN: [id number] Date of visit: 05/02/22

## 2022-05-09 ENCOUNTER — Other Ambulatory Visit: Payer: Self-pay | Admitting: Student

## 2022-05-09 DIAGNOSIS — E119 Type 2 diabetes mellitus without complications: Secondary | ICD-10-CM

## 2022-05-24 ENCOUNTER — Ambulatory Visit (INDEPENDENT_AMBULATORY_CARE_PROVIDER_SITE_OTHER): Payer: Medicare Other | Admitting: Student

## 2022-05-24 ENCOUNTER — Encounter: Payer: Self-pay | Admitting: Student

## 2022-05-24 VITALS — BP 109/55 | HR 65 | Ht 63.0 in | Wt 168.4 lb

## 2022-05-24 DIAGNOSIS — E119 Type 2 diabetes mellitus without complications: Secondary | ICD-10-CM | POA: Diagnosis not present

## 2022-05-24 DIAGNOSIS — G8929 Other chronic pain: Secondary | ICD-10-CM

## 2022-05-24 DIAGNOSIS — M2550 Pain in unspecified joint: Secondary | ICD-10-CM

## 2022-05-24 DIAGNOSIS — Z794 Long term (current) use of insulin: Secondary | ICD-10-CM | POA: Diagnosis not present

## 2022-05-24 DIAGNOSIS — Z7689 Persons encountering health services in other specified circumstances: Secondary | ICD-10-CM

## 2022-05-24 LAB — POCT GLYCOSYLATED HEMOGLOBIN (HGB A1C): HbA1c, POC (controlled diabetic range): 7.1 % — AB (ref 0.0–7.0)

## 2022-05-24 MED ORDER — AMITRIPTYLINE HCL 50 MG PO TABS: 25.0000 mg | ORAL_TABLET | Freq: Every day | ORAL | 0 refills | Status: DC

## 2022-05-24 MED ORDER — DAPAGLIFLOZIN PROPANEDIOL 5 MG PO TABS: 5.0000 mg | ORAL_TABLET | Freq: Every day | ORAL | 2 refills | Status: DC

## 2022-05-24 MED ORDER — TRULICITY 3 MG/0.5ML ~~LOC~~ SOAJ: 3.0000 mg | SUBCUTANEOUS | 2 refills | Status: DC

## 2022-05-24 MED ORDER — GABAPENTIN 800 MG PO TABS: 800.0000 mg | ORAL_TABLET | Freq: Three times a day (TID) | ORAL | 0 refills | Status: DC

## 2022-05-24 MED ORDER — INSULIN DETEMIR 100 UNIT/ML ~~LOC~~ SOLN: 30.0000 [IU] | Freq: Every day | SUBCUTANEOUS | 2 refills | Status: DC

## 2022-05-24 NOTE — Patient Instructions (Addendum)
It was great to see you! Thank you for allowing me to participate in your care!  I recommend that you always bring your medications to each appointment as this makes it easy to ensure we are on the correct medications and helps [place] not miss when refills are needed.  Our plans for today:  - Diabetes  Your diabetes is doing well! Continue current medicines  -Trulicity 3 mg wk -Farxiga 5 mg daily -Levimir 30 units twice a day Record morning blood sugars Follow up in 3 months  - Sleep issues Sleep Diary Keep sleep diary of what time you go to sleep, what time you wake up, how long you are awake, what you do while awake, what time you fall back asleep, what time you wake up  We are checking some labs today, I will call you if they are abnormal will send you a MyChart message or a letter if they are normal.  If you do not hear about your labs in the next 2 weeks please let us know.  Take care and seek immediate care sooner if you develop any concerns.   Dr. Bess Kinds, MD [facility]

## 2022-05-24 NOTE — Progress Notes (Addendum)
SUBJECTIVE:   CHIEF COMPLAINT / HPI:   F/u diabetes Lab Results  Component Value Date   HGBA1C 7.1 (A) 05/24/2022  Meds: -Trulicity 3 mg wk -Farxiga 5 mg daily -Levimir 30 units BID Compliance: taking regularly CBG range: 70-250, mostly in 170-200 range and 70-90 range Urine: ordered today Foot: done today    Sleep Down around 10 pm, waking up every hour or so and having hard time going back to sleep. Nothing really wakes her up, she just wakes up. TV off around 8pm. Waking up around 6 am. No naps during the day, but does feel tired.   Chronic joint pain Seen orthopedic surgery 05/07/22, recommend L4-S1 spinal injections F/u 6 wk for more imaging She reports that ortho said back surgery wouldn't work, but she could get injections.  PERTINENT  PMH / PSH:     OBJECTIVE:  BP (!) 109/55   Pulse 65   Ht 5\' 3"  (1.6 m)   Wt 168 lb 6.4 oz (76.4 kg)   SpO2 98%   BMI 29.83 kg/m  Physical Exam Constitutional:      General: She is not in acute distress.    Appearance: Normal appearance.  Cardiovascular:     Pulses: Normal pulses.     Heart sounds: Normal heart sounds. No murmur heard.    No friction rub. No gallop.  Pulmonary:     Effort: Pulmonary effort is normal. No respiratory distress.     Breath sounds: Normal breath sounds. No stridor. No wheezing, rhonchi or rales.  Abdominal:     General: There is no distension.     Palpations: Abdomen is soft.     Tenderness: There is no abdominal tenderness. There is no guarding or rebound.  Musculoskeletal:     Right foot: No deformity or bunion.     Left foot: No deformity or bunion.  Feet:     Right foot:     Protective Sensation: 8 sites tested.  8 sites sensed.     Skin integrity: No blister, skin breakdown, erythema, callus or dry skin.     Toenail Condition: Right toenails are normal.     Left foot:     Protective Sensation: 8 sites tested.  8 sites sensed.     Skin integrity: No blister, skin breakdown, erythema,  callus or dry skin.     Toenail Condition: Left toenails are normal.  Neurological:     Mental Status: She is alert.      ASSESSMENT/PLAN:  Diabetes mellitus type 2, insulin dependent (HCC) Assessment & Plan: Lab Results  Component Value Date   HGBA1C 7.1 (A) 05/24/2022   Patient fomes in for f/u on DM. Report's good compliance with routine. CBG range 70-250, mostly 170-200, with nearly 1/5 ranging 70-90 range. Patient notes she feels, hypoglycemic if she doesn't eat at night and takes her insulin in the morning. A1c improved and nearly at goal. Encouraged patient to eat regularly, especially in evenings. Will decrease levimir dose down to 25 BID. Foot exam completed today and was normal for sensation.  -Trulicity 3 mg q wk -Fraxiga 5 mg daily -Levimir 25 units BID -F/u 3 months  Orders: -     Microalbumin / creatinine urine ratio -     POCT glycosylated hemoglobin (Hb A1C) -     Dapagliflozin Propanediol; Take 1 tablet (5 mg total) by mouth daily before breakfast.  Dispense: 30 tablet; Refill: 2 -     Gabapentin; Take 1 tablet (800 mg total)  by mouth 3 (three) times daily.  Dispense: 90 tablet; Refill: 0  Sleep concern Assessment & Plan: Patient noting sleep difficulty, going to sleep around 10 pm, but waking every hour or so and having a hard time falling asleep. Nothing seems to cause her to wake, and she turns off the TV around 8 pm. Denies any naps during the day, but notes she does feel tired. Will need more information on sleep habits to determine best plan. Encouraged sleep diary.  -Sleep diary, f/u 3 months.    Chronic joint pain Assessment & Plan: Patient saw Orthopedic surgery 05/07/22 who recommended L4-S1 spinal injections, and according to patient, against surgery. Surgery wating A1c to be < 7.5. Patient to f/u after holidays -F/u 6 wk   Other orders -     Trulicity; Inject 3 mg as directed once a week.  Dispense: 2 mL; Refill: 2 -     Insulin Detemir; Inject 0.3  mLs (30 Units total) into the skin daily.  Dispense: 10 mL; Refill: 2 -     Amitriptyline HCl; Take 0.5 tablets (25 mg total) by mouth at bedtime.  Dispense: 15 tablet; Refill: 0   No follow-ups on file. Bess Kinds, MD 05/26/2022, 7:50 PM PGY-2, [facility]

## 2022-05-25 LAB — MICROALBUMIN / CREATININE URINE RATIO
Creatinine, Urine: 31.4 mg/dL
Microalb/Creat Ratio: 35 mg/g creat — ABNORMAL HIGH (ref 0–29)
Microalbumin, Urine: 10.9 ug/mL

## 2022-05-26 DIAGNOSIS — Z7689 Persons encountering health services in other specified circumstances: Secondary | ICD-10-CM | POA: Insufficient documentation

## 2022-05-26 NOTE — Assessment & Plan Note (Signed)
Patient noting sleep difficulty, going to sleep around 10 pm, but waking every hour or so and having a hard time falling asleep. Nothing seems to cause her to wake, and she turns off the TV around 8 pm. Denies any naps during the day, but notes she does feel tired. Will need more information on sleep habits to determine best plan. Encouraged sleep diary.  -Sleep diary, f/u 3 months.

## 2022-05-26 NOTE — Assessment & Plan Note (Addendum)
Lab Results  Component Value Date   HGBA1C 7.1 (A) 05/24/2022   Patient fomes in for f/u on DM. Report's good compliance with routine. CBG range 70-250, mostly 170-200, with nearly 1/5 ranging 70-90 range. Patient notes she feels, hypoglycemic if she doesn't eat at night and takes her insulin in the morning. A1c improved and nearly at goal. Encouraged patient to eat regularly, especially in evenings. Will decrease levimir dose down to 25 BID. Foot exam completed today and was normal for sensation.  -Trulicity 3 mg q wk -Fraxiga 5 mg daily -Levimir 25 units BID -F/u 3 months

## 2022-05-26 NOTE — Assessment & Plan Note (Signed)
Patient saw Orthopedic surgery 05/07/22 who recommended L4-S1 spinal injections, and according to patient, against surgery. Surgery wating A1c to be < 7.5. Patient to f/u after holidays -F/u 6 wk

## 2022-06-03 ENCOUNTER — Telehealth: Payer: Self-pay | Admitting: Student

## 2022-06-03 ENCOUNTER — Other Ambulatory Visit: Payer: Self-pay | Admitting: Student

## 2022-06-03 DIAGNOSIS — E119 Type 2 diabetes mellitus without complications: Secondary | ICD-10-CM

## 2022-06-03 MED ORDER — DAPAGLIFLOZIN PROPANEDIOL 5 MG PO TABS: 10.0000 mg | ORAL_TABLET | Freq: Every day | ORAL | 2 refills | Status: DC

## 2022-06-03 NOTE — Telephone Encounter (Signed)
Called to inform patient of medication change. Am increasing Farxiga to 10 mg, from 5 mg. This is to help protect her kidneys, as her urine study showed that her kidneys are being effected by her diabetes.   Will call to further discuss DM regimen w/ patient. At this time, no changes to regimen, outside of [place].

## 2022-06-04 NOTE — Telephone Encounter (Signed)
Patient returns call to nurse line.   Patient advised of medication change.

## 2022-06-10 ENCOUNTER — Telehealth: Payer: Self-pay

## 2022-06-10 NOTE — Telephone Encounter (Signed)
Patient calls nurse line requesting refills on diabetic supplies. She needs new prescription for syringes and needles for Levemir.   [facility] [address]   Talbot Grumbling, RN

## 2022-06-11 ENCOUNTER — Telehealth: Payer: Self-pay | Admitting: Physical Medicine and Rehabilitation

## 2022-06-11 ENCOUNTER — Other Ambulatory Visit: Payer: Self-pay | Admitting: Student

## 2022-06-11 MED ORDER — ACCU-CHEK FASTCLIX LANCET KIT: PACK | 0 refills | Status: AC

## 2022-06-11 MED ORDER — ACCU-CHEK GUIDE VI STRP: ORAL_STRIP | 12 refills | Status: AC

## 2022-06-11 MED ORDER — INSULIN DETEMIR 100 UNIT/ML ~~LOC~~ SOLN: 30.0000 [IU] | Freq: Every day | SUBCUTANEOUS | 2 refills | Status: DC

## 2022-06-11 MED ORDER — INSULIN SYRINGES (DISPOSABLE) U-100 0.3 ML MISC: 0 refills | Status: AC

## 2022-06-11 NOTE — Telephone Encounter (Signed)
Patient needs an appt for Gel injection in her back. Please call to schedule

## 2022-06-11 NOTE — Telephone Encounter (Signed)
IC patient back. She was returning call to actually schedule ESI not gel.  She is unable to schedule at this time due to not having transportation to get her home. She stated she will call some of her friends/family to see if she can arrange a driver for procedure and will clal [place] back when ready to schedule.

## 2022-06-11 NOTE — Progress Notes (Signed)
Patient calls nurse line asking for refill of needles, syringes, and levimir.

## 2022-06-11 NOTE — Telephone Encounter (Signed)
DM supplies refilled

## 2022-06-13 ENCOUNTER — Ambulatory Visit: Payer: 59 | Admitting: Orthopedic Surgery

## 2022-07-08 ENCOUNTER — Other Ambulatory Visit: Payer: Self-pay | Admitting: Student

## 2022-07-08 DIAGNOSIS — E119 Type 2 diabetes mellitus without complications: Secondary | ICD-10-CM

## 2022-07-12 ENCOUNTER — Other Ambulatory Visit: Payer: Self-pay | Admitting: Student

## 2022-07-17 ENCOUNTER — Telehealth: Payer: Self-pay

## 2022-07-17 ENCOUNTER — Other Ambulatory Visit (HOSPITAL_COMMUNITY): Payer: Self-pay

## 2022-07-17 ENCOUNTER — Ambulatory Visit: Payer: 59 | Admitting: Orthopedic Surgery

## 2022-07-17 NOTE — Telephone Encounter (Signed)
Rec'ed letter from patients insurance regarding the coverage of Farxiga 52m.   Medication has a maximum qty limit of 1 tablet daily.  Insurance covers Farxiga 124monce daily at no charge.

## 2022-07-24 ENCOUNTER — Other Ambulatory Visit: Payer: Self-pay | Admitting: Student

## 2022-07-24 DIAGNOSIS — E119 Type 2 diabetes mellitus without complications: Secondary | ICD-10-CM

## 2022-07-24 MED ORDER — DAPAGLIFLOZIN PROPANEDIOL 10 MG PO TABS: 10.0000 mg | ORAL_TABLET | Freq: Every day | ORAL | 2 refills | Status: DC

## 2022-07-24 NOTE — Progress Notes (Signed)
Patient insurnace will not cover Farxiga 5 mg, so changed Rx to farxiga 10 mg

## 2022-07-24 NOTE — Telephone Encounter (Signed)
Thank you very much 

## 2022-07-26 ENCOUNTER — Other Ambulatory Visit: Payer: Self-pay | Admitting: Student

## 2022-08-02 ENCOUNTER — Other Ambulatory Visit: Payer: Self-pay | Admitting: Student

## 2022-08-02 ENCOUNTER — Ambulatory Visit: Payer: 59 | Admitting: Orthopedic Surgery

## 2022-08-02 DIAGNOSIS — E119 Type 2 diabetes mellitus without complications: Secondary | ICD-10-CM

## 2022-08-04 ENCOUNTER — Other Ambulatory Visit: Payer: Self-pay | Admitting: Student

## 2022-08-08 ENCOUNTER — Telehealth: Payer: Self-pay | Admitting: Student

## 2022-08-08 NOTE — Telephone Encounter (Signed)
Contacted [name] to schedule their annual wellness visit. Appointment made for 08/15/2022.  Thank you,  [place] Direct dial  [phone]

## 2022-08-14 ENCOUNTER — Ambulatory Visit (INDEPENDENT_AMBULATORY_CARE_PROVIDER_SITE_OTHER): Payer: 59 | Admitting: Orthopedic Surgery

## 2022-08-14 DIAGNOSIS — M5416 Radiculopathy, lumbar region: Secondary | ICD-10-CM | POA: Diagnosis not present

## 2022-08-14 MED ORDER — METHOCARBAMOL 500 MG PO TABS: 500.0000 mg | ORAL_TABLET | Freq: Four times a day (QID) | ORAL | 1 refills | Status: DC | PRN

## 2022-08-14 MED ORDER — PREGABALIN 50 MG PO CAPS: 50.0000 mg | ORAL_CAPSULE | Freq: Three times a day (TID) | ORAL | 0 refills | Status: DC

## 2022-08-14 NOTE — Progress Notes (Signed)
Orthopedic Spine Surgery Office Note  Assessment: Patient is a 67 y.o. female with low back pain that radiates into the right lower extremity.  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  Plan: -Patient has tried NSAIDs, Tylenol, physical therapy, activity modification  -Her most recent A1c was 7.1 on 05/24/2022 so I presented surgery as a treatment option for her but given her social situation she is not ready for surgery.  Accordingly, we discussed further nonoperative treatments.  I told her she could try a muscle relaxer and Lyrica.  Both of these were prescribed to her today.  Instructed her to discontinue the gabapentin while taking the Lyrica. -Patient should return to office in 6 weeks, x-rays at next visit: AP/lateral/flex/ex lumbar   Patient expressed understanding of the plan and all questions were answered to the patient's satisfaction.   ___________________________________________________________________________  History: Patient is a 67 y.o. female who has been previously seen in the office for symptoms consistent with lumbar radiculopathy.  Patient symptoms have not changed since the last time I saw her.  She is still having pain in her back that radiates into her right lower extremity.  She feels it on the posterior lateral aspect of the thigh and leg.  She states it goes to the front of the ankle.  Does not radiate into the foot.  She is still not having any left-sided symptoms.  Symptoms are worse with activity and improved with rest but did not completely go away.  At her last visit, I talked about an injection as a possible treatment option.  Her daughter has breast cancer and she has been taking care of her so she was unable to come in for that injection.  She is also taking care of her grandkids while her daughter is undergoing treatment so she is not in a place for surgery either.  Previous treatments: NSAIDs,  Tylenol, physical therapy, activity modification    Physical Exam:  BMI of 29  General: no acute distress, appears stated age Neurologic: alert, answering questions appropriately, following commands Respiratory: unlabored breathing on room air, symmetric chest rise Psychiatric: appropriate affect, normal cadence to speech   MSK (spine):   -Strength exam                                                   Left                  Right EHL                              4/5                  4/5 TA                                 5/5                  5/5 GSC                             5/5  5/5 Knee extension            5/5                  5/5 Hip flexion                    5/5                  5/5   -Sensory exam                           Sensation intact to light touch in L3-S1 nerve distributions of bilateral lower extremities   -Positive Hoffmann bilaterally -Achilles DTR: 2/4 on the left, 2/4 on the right -Patellar tendon DTR: 1/4 on the left, 1/4 on the right   -Straight leg raise: negative -Contralateral straight leg raise: negative -Clonus: no beats bilaterally   -Left hip exam: no pain through range of motion, negative stinchfield -Right hip exam: no pain through range of motion, negative stinchfield  Imaging: XR of the lumbar spine from 05/02/2022, 03/21/2022 was independently reviewed and interpreted, showing spondylolisthesis at L5-S1 that shifts 1 mm between flexion and extension.  Disc height loss at multiple levels throughout the lumbar spine.  No fracture seen. PI of 48, LL of 45.    MRI of the lumbar spine from  was independently reviewed and interpreted, showing DDD at multiple levels.  Spondylolisthesis at L5/S1.  Central lateral recess stenosis at L4-5, lateral recess stenosis at L5-S1.  Bilateral foraminal stenosis at L4-5 and L5-S1   Patient name: [name] Patient MRN: [id number] Date of visit: 08/14/22

## 2022-08-15 ENCOUNTER — Ambulatory Visit (INDEPENDENT_AMBULATORY_CARE_PROVIDER_SITE_OTHER): Payer: 59

## 2022-08-15 DIAGNOSIS — Z0000 Encounter for general adult medical examination without abnormal findings: Secondary | ICD-10-CM

## 2022-08-15 DIAGNOSIS — Z1211 Encounter for screening for malignant neoplasm of colon: Secondary | ICD-10-CM

## 2022-08-15 NOTE — Patient Instructions (Signed)

## 2022-08-15 NOTE — Progress Notes (Addendum)
I connected with  [name] on 08/15/22 by a audio enabled telemedicine application and verified that I am speaking with the correct person using two identifiers.  Patient Location: Home  Provider Location: Home Office  I discussed the limitations of evaluation and management by telemedicine. The patient expressed understanding and agreed to proceed.   Subjective:   [name] is a 67 y.o. female who presents for an Initial Medicare Annual Wellness Visit.  Review of Systems    Per HPI unless specifically indicated below.         Objective:      05/24/2022    9:11 AM 05/02/2022   10:00 AM 03/21/2022    3:24 PM  Vitals with BMI  Height 5\' 3"  5\' 3"  5\' 3"   Weight 168 lbs 6 oz 164 lbs 164 lbs  BMI 29.84 0000000 0000000  Systolic 0000000 [id number]  Diastolic 55 50 83  Pulse 65 42 60     Today's Vitals   08/15/22 1209  PainSc: 7    There is no height or weight on file to calculate BMI.     08/15/2022   12:19 PM 05/24/2022    9:14 AM 02/22/2022    9:31 AM 12/19/2021   10:40 AM 12/05/2021   10:32 AM 11/23/2021    9:52 AM 10/05/2021   12:00 AM  Advanced Directives  Does Patient Have a Medical Advance Directive? No No No No No No No  Would patient like information on creating a medical advance directive? No - Patient declined  No - Patient declined No - Patient declined No - Patient declined No - Patient declined Yes (Inpatient - patient requests chaplain consult to create a medical advance directive)    Current Medications (verified) Outpatient Encounter Medications as of 08/15/2022  Medication Sig   Accu-Chek FastClix Lancets MISC Please use to check blood sugar up to four times daily.   ADVAIR HFA 115-21 MCG/ACT inhaler Inhale 2 puffs into the lungs 2 (two) times daily as needed (wheezing and shortness of breath).   amitriptyline (ELAVIL) 50 MG tablet TAKE 1/2 TABLET BY MOUTH AT BEDTIME   ARIPiprazole (ABILIFY) 5 MG tablet Take 5 mg by mouth daily.   aspirin EC 81 MG tablet  Take 1 tablet (81 mg total) by mouth daily.   blood glucose meter kit and supplies Dispense based on patient and insurance preference. Use up to four times daily as directed. (FOR ICD-10 E10.9, E11.9).   Blood Glucose Monitoring Suppl (ACCU-CHEK GUIDE) w/Device KIT Please use to check blood sugar up to four times daily.   dapagliflozin propanediol (FARXIGA) 10 MG TABS tablet Take 1 tablet (10 mg total) by mouth daily before breakfast.   Dulaglutide (TRULICITY) 3 0000000 SOPN Inject 3 mg as directed once a week.   glucose blood (ACCU-CHEK GUIDE) test strip Please use to check blood sugar up to four times daily.   insulin detemir (LEVEMIR) 100 UNIT/ML injection Inject 0.3 mLs (30 Units total) into the skin daily.   Insulin Syringes, Disposable, U-100 0.3 ML MISC Use with insulin   Lancets Misc. (ACCU-CHEK FASTCLIX LANCET) KIT Please use to check blood sugar up to four times daily.   MAGNESIUM PO Take 1 tablet by mouth daily at 6 (six) AM.   Menthol-Methyl Salicylate (SALONPAS PAIN RELIEF PATCH EX) Place 1 patch onto the skin daily as needed (pain).   methocarbamol (ROBAXIN) 500 MG tablet Take 1 tablet (500 mg total) by mouth every 6 (six) hours as  needed for muscle spasms.   Multiple Vitamins-Minerals (ADVANCED DIABETIC MULTIVITAMIN PO) Take 1 tablet by mouth daily.   omega-3 acid ethyl esters (LOVAZA) 1 g capsule Take 1 g by mouth 2 (two) times daily.   polyethylene glycol (MIRALAX / GLYCOLAX) 17 g packet Take 17 g by mouth daily.   pregabalin (LYRICA) 50 MG capsule Take 1 capsule (50 mg total) by mouth 3 (three) times daily.   rosuvastatin (CRESTOR) 20 MG tablet TAKE 1 TABLET BY MOUTH EVERY DAY   sertraline (ZOLOFT) 100 MG tablet Take 200 mg by mouth at bedtime.   vitamin B-12 (CYANOCOBALAMIN) 1000 MCG tablet Take 3,000 mcg by mouth daily.   ZTLIDO 1.8 % PTCH Place 1 patch onto the skin daily.   naloxone (NARCAN) 0.4 MG/ML injection Inject 1 mL (0.4 mg total) into the vein as needed. (Patient  not taking: Reported on 08/15/2022)   [DISCONTINUED] metFORMIN (GLUCOPHAGE) 500 MG tablet Take 2 tablets (1,000 mg total) by mouth 2 (two) times daily with a meal.   No facility-administered encounter medications on file as of 08/15/2022.    Allergies (verified) Patient has no known allergies.   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   Type 2 diabetes mellitus ([place])    Urgency of urination    Past Surgical History:  Procedure Laterality Date   ANTERIOR CERVICAL DECOMP/DISCECTOMY FUSION N/A 10/20/2020   Procedure: C5-6, C6-7 ANTERIOR CERVICAL DISCECTOMY, C6 CORPECTOMY, C5-C7 PLATE, ALLOGRAFT;  Surgeon: Jessy Oto, MD;  Location: [facility];  Service: Orthopedics;  Laterality: N/A;   BREAST EXCISIONAL BIOPSY Right 1983   CARDIAC CATHETERIZATION  02-10-2009  DR BENSIMHON   NORMAL CORONARIES/  LOW NORMAL LVF WITHOUT WALL MOTION ABNORMALITIES   CARDIAC CATHETERIZATION  11-21-2010  DR Einar Gip   NORMAL CORONARIES/  EF 50-55%   CHOLECYSTECTOMY  1990s   back in [place] Right 09/04/2013   Procedure: CYSTOSCOPY WITH RETROGRADE PYELOGRAM/URETERAL STENT PLACEMENT;  Surgeon: Molli Hazard, MD;  Location: [facility];  Service: Urology;  Laterality: Right;   CYSTOSCOPY WITH RETROGRADE PYELOGRAM, URETEROSCOPY AND STENT PLACEMENT Right 09/27/2013   Procedure: CYSTOSCOPY WITH RETROGRADE PYELOGRAM, URETEROSCOPY AND STENT EXCHANGE;  Surgeon: Molli Hazard, MD;  Location: [facility];  Service: Urology;  Laterality: Right;   HOLMIUM LASER APPLICATION Right [id number]   Procedure: HOLMIUM LASER APPLICATION;  Surgeon: Molli Hazard, MD;  Location: [facility];  Service: Urology;  Laterality: Right;   HYSTEROSCOPY WITH D & C  01/25/2003   IR URETERAL STENT RIGHT NEW ACCESS W/O SEP NEPHROSTOMY CATH  07/08/2017   LAPAROSCOPIC ASSISTED VAGINAL HYSTERECTOMY  03/02/2003   W/  BILATERAL SALPINGOOPHORECTOMY   LAPAROSCOPY  EXTENSIVE LYSIS ADHESIONS/  REDO PARAESOPHAGEAL HIATAL HERNIA WITH PRIMARY CLOSURE AND MESH/ NISSEN FUNDOPLICATION (1cm)/  REPAIR GASTROTOMY  08/22/2009   NEPHROLITHOTOMY Right 07/08/2017  Procedure: NEPHROLITHOTOMY PERCUTANEOUS;  Surgeon: Festus Aloe, MD;  Location: [facility];  Service: Urology;  Laterality: Right;   NISSEN FUNDOPLICATION  [id number]   W/  CHOLECYSTECTOMY   SHOULDER ARTHROSCOPY WITH SUBACROMIAL DECOMPRESSION, ROTATOR CUFF REPAIR AND BICEP TENDON REPAIR Left 08/02/2010   AND LABRAL DEBRIDEMENT   TOTAL KNEE ARTHROPLASTY Right 07/01/2006   TOTAL KNEE ARTHROPLASTY Left 06/08/2020   Procedure: LEFT TOTAL KNEE ARTHROPLASTY;  Surgeon: Meredith Pel, MD;  Location: [facility];  Service: Orthopedics;  Laterality: Left;   TOTAL KNEE REVISION  02/25/2012   Procedure: TOTAL KNEE REVISION;  Surgeon: Meredith Pel, MD;  Location: [facility];  Service: Orthopedics;  Laterality: Right;  Revise right total knee arthroplasty   TUBAL LIGATION  1990   WRIST FUSION Left 2004   RETAINED HARDWARE   Family History  Problem Relation Age of Onset   Heart disease Father    Lung cancer Sister    Liver cancer Sister    Social History   Socioeconomic History   Marital status: Widowed    Spouse name: Not on file   Number of children: 3   Years of education: Not on file   Highest education level: Not on file  Occupational History   Occupation: Retired  Tobacco Use   Smoking status: Never   Smokeless tobacco: Never  Vaping Use    Vaping Use: Never used  Substance and Sexual Activity   Alcohol use: No   Drug use: No   Sexual activity: Not on file    Comment: Hysterectomy  Other Topics Concern   Not on file  Social History Narrative   Not on file   Social Determinants of Health   Financial Resource Strain: Low Risk  (08/15/2022)   Overall Financial Resource Strain (CARDIA)    Difficulty of Paying Living Expenses: Not hard at all  Food Insecurity: No Food Insecurity (08/15/2022)   Hunger Vital Sign    Worried About Running Out of Food in the Last Year: Never true    [place] in the Last Year: Never true  Transportation Needs: No Transportation Needs (08/15/2022)   PRAPARE - Hydrologist (Medical): No    Lack of Transportation (Non-Medical): No  Physical Activity: Insufficiently Active (08/15/2022)   Exercise Vital Sign    Days of Exercise per Week: 2 days    Minutes of Exercise per Session: 10 min  Stress: No Stress Concern Present (08/15/2022)   [place]    Feeling of Stress : Not at all  Social Connections: Socially Isolated (08/15/2022)   Social Connection and Isolation Panel [NHANES]    Frequency of Communication with Friends and Family: More than three times a week    Frequency of Social Gatherings with Friends and Family: More than three times a week    Attends Religious Services: Never    Marine scientist or Organizations: No    Attends Archivist Meetings: Never    Marital Status: Widowed    Tobacco Counseling Counseling given: No   Clinical Intake:  Pre-visit preparation completed: No  Pain : 0-10 Pain Score: 7  Pain Type: Chronic pain Pain Location: Back (Rt hip) Pain Descriptors / Indicators: Constant, Throbbing Pain Onset: More than a month ago Pain Frequency: Constant     Nutritional Status: BMI of 19-24  Normal Nutritional Risks: None Diabetes: Yes CBG done?:  Yes CBG resulted in Enter/ Edit results?:  No Did pt. bring in CBG monitor from home?: No  How often do you need to have someone help you when you read instructions, pamphlets, or other written materials from your doctor or pharmacy?: 1 - Never  Diabetic?Nutrition Risk Assessment:  Has the patient had any N/V/D within the last 2 months?  No  Does the patient have any non-healing wounds?  No  Has the patient had any unintentional weight loss or weight gain?  No   Diabetes:  Is the patient diabetic?  Yes  If diabetic, was a CBG obtained today?  Yes  Did the patient bring in their glucometer from home?  No  How often do you monitor your CBG's? Twice daily  .   Financial Strains and Diabetes Management:  Are you having any financial strains with the device, your supplies or your medication? No .  Does the patient want to be seen by Chronic Care Management for management of their diabetes?  No  Would the patient like to be referred to a Nutritionist or for Diabetic Management?  No   Diabetic Exams:  Diabetic Eye Exam: Overdue for diabetic eye exam. Pt has been advised about the importance in completing this exam. Patient advised to call and schedule an eye exam. Ordered on 01/15/2022 Diabetic Foot Exam: Completed 01/15/22   Interpreter Needed?: No  Information entered by :: Donnie [place], [place]   Activities of Daily Living    08/15/2022   12:06 PM 10/05/2021   12:00 AM  In your present state of health, do you have any difficulty performing the following activities:  Hearing? 0   Vision? 0   Comment Reading glasses, scheduled appt with Dr. Imagene Riches   Walking or climbing stairs? 1   Comment the patient uses a Rollator to get around   Dressing or bathing? 0   Doing errands, shopping? 0 0    Patient Care Team: Holley Bouche, MD as PCP - General (Family Medicine)  Indicate any recent Medical Services you may have received from other than [facility] providers in the past year (date  may be approximate).     Assessment:   This is a routine wellness examination for [place].   Hearing/Vision screen Denies any hearing issues. Denies any change to her vision. Wear glasses. Overdue for an Annual Eye Exam. Upcoming Appt scheduled with Dr. Gershon Crane Dietary issues and exercise activities discussed: Current Exercise Habits: Structured exercise class, Type of exercise: walking, Time (Minutes): 10, Frequency (Times/Week): 2, Weekly Exercise (Minutes/Week): 20, Intensity: Mild, Exercise limited by: orthopedic condition(s)   Goals Addressed   None    Depression Screen    08/15/2022   12:05 PM 05/24/2022    9:13 AM 02/22/2022    9:34 AM 12/19/2021   10:40 AM 12/05/2021   11:49 AM 11/23/2021    9:53 AM 10/08/2016   10:40 AM  PHQ 2/9 Scores  PHQ - 2 Score 0 1 3 0 2 0 0  PHQ- 9 Score  3 7 1 6 2      Fall Risk    08/15/2022   12:05 PM 02/22/2022    9:32 AM 12/19/2021   10:40 AM 10/08/2016   10:40 AM  Fall Risk   Falls in the past year? 0 0 0 No  Number falls in past yr: 0     Injury with Fall? 0     Risk for fall due to : No Fall Risks     Follow up Falls evaluation completed  FALL RISK PREVENTION PERTAINING TO THE HOME:  Any stairs in or around the home? No  If so, are there any without handrails? No  Home free of loose throw rugs in walkways, pet beds, electrical cords, etc? Yes  Adequate lighting in your home to reduce risk of falls? Yes   ASSISTIVE DEVICES UTILIZED TO PREVENT FALLS:  Life alert? No  Use of a cane, walker or w/c? Yes  Grab bars in the bathroom? Yes  Shower chair or bench in shower? Yes  Elevated toilet seat or a handicapped toilet? No   TIMED UP AND GO:  Was the test performed? Unable to perform, virtual appointment   Cognitive Function:        08/15/2022   12:08 PM  6CIT Screen  What Year? 0 points  What month? 0 points  What time? 0 points  Count back from 20 0 points  Months in reverse 0 points  Repeat phrase 0 points  Total  Score 0 points    Immunizations Immunization History  Administered Date(s) Administered   Influenza Split 02/26/2012   Influenza,inj,Quad PF,6+ Mos 03/28/2017   PFIZER(Purple Top)SARS-COV-2 Vaccination 08/24/2019, 09/16/2019, 05/02/2020   Pneumococcal Polysaccharide-23 02/26/2012, 10/04/2016    TDAP status: Due, Education has been provided regarding the importance of this vaccine. Advised may receive this vaccine at local pharmacy or Health Dept. Aware to provide a copy of the vaccination record if obtained from local pharmacy or Health Dept. Verbalized acceptance and understanding.  Flu Vaccine status: Due, Education has been provided regarding the importance of this vaccine. Advised may receive this vaccine at local pharmacy or Health Dept. Aware to provide a copy of the vaccination record if obtained from local pharmacy or Health Dept. Verbalized acceptance and understanding.  Pneumococcal vaccine status: Due, Education has been provided regarding the importance of this vaccine. Advised may receive this vaccine at local pharmacy or Health Dept. Aware to provide a copy of the vaccination record if obtained from local pharmacy or Health Dept. Verbalized acceptance and understanding.  Covid-19 vaccine status: Information provided on how to obtain vaccines.   Qualifies for Shingles Vaccine? Yes   Zostavax completed No   Shingrix Completed?: No.    Education has been provided regarding the importance of this vaccine. Patient has been advised to call insurance company to determine out of pocket expense if they have not yet received this vaccine. Advised may also receive vaccine at local pharmacy or Health Dept. Verbalized acceptance and understanding.  Screening Tests Health Maintenance  Topic Date Due   OPHTHALMOLOGY EXAM  Never done   DTaP/Tdap/Td (1 - Tdap) Never done   Zoster Vaccines- Shingrix (1 of 2) Never done   COLONOSCOPY (Pts 45-53yrs Insurance coverage will need to be  confirmed)  Never done   COVID-19 Vaccine (4 - 2023-24 season) 01/25/2022   INFLUENZA VACCINE  08/25/2022 (Originally 12/25/2021)   Diabetic kidney evaluation - eGFR measurement  10/08/2022   HEMOGLOBIN A1C  11/23/2022   FOOT EXAM  01/16/2023   Diabetic kidney evaluation - Urine ACR  05/25/2023   Medicare Annual Wellness (AWV)  08/15/2023   MAMMOGRAM  02/14/2024   Hepatitis C Screening  Completed   HIV Screening  Completed   HPV VACCINES  Aged Out   PAP SMEAR-Modifier  Discontinued    Health Maintenance  Health Maintenance Due  Topic Date Due   OPHTHALMOLOGY EXAM  Never done   DTaP/Tdap/Td (1 - Tdap) Never done   Zoster Vaccines- Shingrix (1 of 2)  Never done   COLONOSCOPY (Pts 45-31yrs Insurance coverage will need to be confirmed)  Never done   COVID-19 Vaccine (4 - 2023-24 season) 01/25/2022    Colorectal cancer screening: Referral to GI placed 08/15/2022. Pt aware the office will call re: appt.  Mammogram status: Completed 02/13/2022. Repeat every year  DEXA scan: not applicable  Lung Cancer Screening: (Low Dose CT Chest recommended if Age 53-80 years, 30 pack-year currently smoking OR have quit w/in 15years.) does not qualify.   Lung Cancer Screening Referral: not applicable   Additional Screening:  Hepatitis C Screening: does qualify; Completed 01/15/2022  Vision Screening: Recommended annual ophthalmology exams for early detection of glaucoma and other disorders of the eye. Is the patient up to date with their annual eye exam?  No  Who is the provider or what is the name of the office in which the patient attends annual eye exams? Appt schedule with Dr. Gershon Crane, MD If pt is not established with a provider, would they like to be referred to a provider to establish care? No .   Dental Screening: Recommended annual dental exams for proper oral hygiene  Community Resource Referral / Chronic Care Management: CRR required this visit?  No   CCM required this visit?  No       Plan:     I have personally reviewed and noted the following in the patient's chart:   Medical and social history Use of alcohol, tobacco or illicit drugs  Current medications and supplements including opioid prescriptions. Patient is not currently taking opioid prescriptions. Functional ability and status Nutritional status Physical activity Advanced directives List of other physicians Hospitalizations, surgeries, and ER visits in previous 12 months Vitals Screenings to include cognitive, depression, and falls Referrals and appointments  In addition, I have reviewed and discussed with patient certain preventive protocols, quality metrics, and best practice recommendations. A written personalized care plan for preventive services as well as general preventive health recommendations were provided to patient.    Ms. Defino , Thank you for taking time to come for your Medicare Wellness Visit. I appreciate your ongoing commitment to your health goals. Please review the following plan we discussed and let me know if I can assist you in the future.   These are the goals we discussed:  Goals   None     This is a list of the screening recommended for you and due dates:  Health Maintenance  Topic Date Due   Eye exam for diabetics  Never done   DTaP/Tdap/Td vaccine (1 - Tdap) Never done   Zoster (Shingles) Vaccine (1 of 2) Never done   Colon Cancer Screening  Never done   COVID-19 Vaccine (4 - 2023-24 season) 01/25/2022   Flu Shot  08/25/2022*   Yearly kidney function blood test for diabetes  10/08/2022   Hemoglobin A1C  11/23/2022   Complete foot exam   01/16/2023   Yearly kidney health urinalysis for diabetes  05/25/2023   Medicare Annual Wellness Visit  08/15/2023   Mammogram  02/14/2024   Hepatitis C Screening: USPSTF Recommendation to screen - Ages 18-79 yo.  Completed   HIV Screening  Completed   HPV Vaccine  Aged Out   Pap Smear  Discontinued  *Topic was postponed.  The date shown is not the original due date.     [name], CMA   08/15/2022   Nurse Notes: Approximately 30 minute Non-Face -To-Face Medicare Wellness Visit       I  have reviewed this visit and agree with the documentation.  Dorris Singh, MD  Family Medicine Teaching Service

## 2022-08-15 NOTE — Progress Notes (Signed)
I have reviewed this visit and agree with the documentation.   

## 2022-08-29 ENCOUNTER — Other Ambulatory Visit: Payer: Self-pay | Admitting: Student

## 2022-08-29 DIAGNOSIS — E119 Type 2 diabetes mellitus without complications: Secondary | ICD-10-CM

## 2022-09-02 NOTE — Telephone Encounter (Signed)
Patient calls nurse line regarding gabapentin refill. Per chart review, patient was prescribed pregabalin on 08/14/22. She states that she has not tried this yet and was asking if PCP wants her to take this or the gabapentin.   Will forward to PCP for further advisement.   Veronda Prude, RN

## 2022-09-03 NOTE — Telephone Encounter (Signed)
Called patient and left VM for call back.   Patient was started on Lyrica, and should try this new med. She was to stop gabapentin. Let's try the lyrica and see if she get's any benefit.   -BS

## 2022-09-03 NOTE — Telephone Encounter (Signed)
Patient returns call to nurse line. Advised of message per PCP. Patient has no further questions at this time.   Veronda Prude, RN

## 2022-09-12 ENCOUNTER — Ambulatory Visit: Payer: 59 | Admitting: Student

## 2022-09-12 NOTE — Progress Notes (Deleted)
  SUBJECTIVE:   CHIEF COMPLAINT / HPI:   F/u A1c and change insulin  DM Lab Results  Component Value Date   HGBA1C 7.1 (A) 05/24/2022  Meds:  -Trulicity 3 mg wk ?? 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 Physical Exam   ASSESSMENT/PLAN:  There are no diagnoses linked to this encounter. No follow-ups on file. Bess Kinds, MD 09/12/2022, 12:35 PM PGY-***, [facility] {    This will disappear when note is signed, click to select method of visit    :1}

## 2022-09-18 ENCOUNTER — Encounter: Payer: Self-pay | Admitting: Student

## 2022-09-18 ENCOUNTER — Other Ambulatory Visit: Payer: Self-pay

## 2022-09-18 ENCOUNTER — Ambulatory Visit (INDEPENDENT_AMBULATORY_CARE_PROVIDER_SITE_OTHER): Payer: 59 | Admitting: Student

## 2022-09-18 VITALS — BP 112/81 | HR 94 | Ht 63.0 in | Wt 172.6 lb

## 2022-09-18 DIAGNOSIS — Z794 Long term (current) use of insulin: Secondary | ICD-10-CM | POA: Diagnosis not present

## 2022-09-18 DIAGNOSIS — G8929 Other chronic pain: Secondary | ICD-10-CM | POA: Diagnosis not present

## 2022-09-18 DIAGNOSIS — E119 Type 2 diabetes mellitus without complications: Secondary | ICD-10-CM | POA: Diagnosis not present

## 2022-09-18 DIAGNOSIS — M2550 Pain in unspecified joint: Secondary | ICD-10-CM | POA: Diagnosis not present

## 2022-09-18 LAB — POCT GLYCOSYLATED HEMOGLOBIN (HGB A1C): HbA1c, POC (controlled diabetic range): 8.9 % — AB (ref 0.0–7.0)

## 2022-09-18 NOTE — Assessment & Plan Note (Addendum)
Patient A1c worse today than previously 8.9 (7.1). Patient w/ confusion about how much insulin she is taking/when. Unsure if patient is taking medication correctly, and appears to be cause for concern given elevated A1c. Will have patient f/u w/ Dr. Raymondo Band to discuss how/when to take medication. Patient also needing to switch from levemir to alternate LAI.  Reviewed current regimen w/ patient will not make changes until she see's Dr. Raymondo Band. -F/u w/ Dr. Raymondo Band for DM management/change in [facility] -F/u w/ me 1 month after meeting w/ Dr. Raymondo Band, to double check on good regimen -A1c in 3 months -Continue current regimen, until meeting w/ Dr. Raymondo Band  -Trulicity 3 mg wk -Farxiga 10 mg daily -Levimir 30 units BID

## 2022-09-18 NOTE — Progress Notes (Signed)
  SUBJECTIVE:   CHIEF COMPLAINT / HPI:   DM F/u Meds:  -Trulicity 3 mg wk -Farxiga 10 mg daily -Levimir 30 units BID CBG range: 160's in am, 270 in evening.  Has some confusion about what/how much she is taking for her insulin. Sugar range is higher this visit.   Back pain Notes it's getting worse, having symptoms at night that wake her up, is afraid of having surgery but was told by her surgeon, is her only oprtion for relief and liklely 80% gain of benefit/improvement in pain. Patient note's her balance is being compromised but is walking w/ rollator more.    PERTINENT  PMH / PSH:    Patient Care Team: Bess Kinds, MD as PCP - General (Family Medicine) OBJECTIVE:  BP 112/81   Pulse 94   Ht  (1.6 m)   Wt 172 lb 9.6 oz (78.3 kg)   SpO2 94%   BMI 30.57 kg/m  Physical Exam Constitutional:      General: She is not in acute distress.    Appearance: Normal appearance. She is not ill-appearing.  Cardiovascular:     Heart sounds: Normal heart sounds. No murmur heard.    No friction rub. No gallop.  Pulmonary:     Effort: Pulmonary effort is normal. No respiratory distress.     Breath sounds: Normal breath sounds. No stridor. No wheezing, rhonchi or rales.  Neurological:     Mental Status: She is alert.      ASSESSMENT/PLAN:  Diabetes mellitus type 2, insulin dependent Assessment & Plan: Patient A1c worse today than previously 8.9 (7.1). Patient w/ confusion about how much insulin she is taking/when. Unsure if patient is taking medication correctly, and appears to be cause for concern given elevated A1c. Will have patient f/u w/ Dr. Raymondo Band to discuss how/when to take medication. Patient also needing to switch from levemir to alternate LAI.  Reviewed current regimen w/ patient will not make changes until she see's Dr. Raymondo Band. -F/u w/ Dr. Raymondo Band for DM management/change in [facility] -F/u w/ me 1 month after meeting w/ Dr. Raymondo Band, to double check on good regimen -A1c in 3  months -Continue current regimen, until meeting w/ Dr. Raymondo Band  -Trulicity 3 mg wk -Farxiga 10 mg daily -Levimir 30 units BID  Orders: -     POCT glycosylated hemoglobin (Hb A1C)  Chronic joint pain Assessment & Plan: Patient follows w/ orthopedic surgeon who is recommending back surgery to relieve chronic pain issue. Patient is hesitant to have surgery, but leaning towards having, given her pain and debility. I support patient receiving back surgery, as she had issue of polypharmacy in past, when trying to manage chronic pain in past.  -F.u w/ ortho as needed    No follow-ups on file. Bess Kinds, MD 09/18/2022, 3:20 PM PGY-2, [facility]

## 2022-09-18 NOTE — Assessment & Plan Note (Addendum)
Patient follows w/ orthopedic surgeon who is recommending back surgery to relieve chronic pain issue. Patient is hesitant to have surgery, but leaning towards having, given her pain and debility. I support patient receiving back surgery, as she had issue of polypharmacy in past, when trying to manage chronic pain in past.  -F.u w/ ortho as needed

## 2022-09-18 NOTE — Patient Instructions (Addendum)
It was great to see you! Thank you for allowing me to participate in your care!  I recommend that you always bring your medications to each appointment as this makes it easy to ensure we are on the correct medications and helps [place] not miss when refills are needed.  Our plans for today:  - Diabetes Management  Schedule appointment with Dr. Madelon Lips for "Diabetic follow up/management"  Your A1c went up from before, likely meaning that we don't have good control of your diabetes  Will also need to be switched from Levimir to an alternate Long Acting Insulin  Will also need to increase dose of Trulicity, once we are sure of what dose you are on.  Make follow up appointment with me in 1 month after seeing Dr. Raymondo Band  - Back pain  Consider getting surgery, I strongly recommend and believe this could provide you the greatest amount of benefit   Take care and seek immediate care sooner if you develop any concerns.   Dr. Bess Kinds, MD [facility]

## 2022-09-24 ENCOUNTER — Ambulatory Visit: Payer: 59 | Admitting: Pharmacist

## 2022-09-24 ENCOUNTER — Telehealth: Payer: Self-pay | Admitting: Pharmacist

## 2022-09-24 NOTE — Telephone Encounter (Signed)
Attempted to contact patient RE missed appointment.   Left HIPAA compliant message requesting reschedule.  Provided [phone] number for scheduling.

## 2022-09-25 ENCOUNTER — Ambulatory Visit: Payer: 59 | Admitting: Orthopedic Surgery

## 2022-09-27 ENCOUNTER — Ambulatory Visit: Payer: 59 | Admitting: Pharmacist

## 2022-09-27 ENCOUNTER — Other Ambulatory Visit: Payer: Self-pay | Admitting: Student

## 2022-10-02 ENCOUNTER — Ambulatory Visit: Payer: 59 | Admitting: Orthopedic Surgery

## 2022-10-03 ENCOUNTER — Ambulatory Visit: Payer: 59 | Admitting: Pharmacist

## 2022-10-06 ENCOUNTER — Other Ambulatory Visit: Payer: Self-pay | Admitting: Student

## 2022-10-06 ENCOUNTER — Other Ambulatory Visit: Payer: Self-pay | Admitting: Orthopedic Surgery

## 2022-10-06 DIAGNOSIS — E119 Type 2 diabetes mellitus without complications: Secondary | ICD-10-CM

## 2022-10-07 ENCOUNTER — Other Ambulatory Visit: Payer: Self-pay | Admitting: Student

## 2022-10-07 DIAGNOSIS — E119 Type 2 diabetes mellitus without complications: Secondary | ICD-10-CM

## 2022-10-08 ENCOUNTER — Ambulatory Visit: Payer: 59 | Admitting: Pharmacist

## 2022-10-09 ENCOUNTER — Ambulatory Visit (INDEPENDENT_AMBULATORY_CARE_PROVIDER_SITE_OTHER): Payer: 59 | Admitting: Orthopedic Surgery

## 2022-10-09 ENCOUNTER — Other Ambulatory Visit (INDEPENDENT_AMBULATORY_CARE_PROVIDER_SITE_OTHER): Payer: 59

## 2022-10-09 DIAGNOSIS — M4317 Spondylolisthesis, lumbosacral region: Secondary | ICD-10-CM | POA: Diagnosis not present

## 2022-10-09 DIAGNOSIS — M5416 Radiculopathy, lumbar region: Secondary | ICD-10-CM | POA: Diagnosis not present

## 2022-10-09 NOTE — Progress Notes (Signed)
Orthopedic Spine Surgery Office Note  Assessment: Patient is a 67 y.o. female with low back pain tthat radiates into her legs. When pain is severe, feels like she is going to fall. 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    Plan: -Patient has tried PT, tylenol, NSAIDs, activity modification, lyrica, muscle relaxer -Recommended she continue with the NSAIDs and tylenol  -Briefly, discussed L4-S1 ALIF and PSIF for indirect decompression -Patient should return to office in 5 weeks, x-rays at next visit: none   Patient expressed understanding of the plan and all questions were answered to the patient's satisfaction.   ___________________________________________________________________________  History: Patient is a 67 y.o. female who has been previously seen in the office for symptoms consistent with lumbar radiculopathy. Since the last visit, symptom intensity has become more severe. She now notes pain radiating into her left leg. She has felt both her legs give way at times when the pain is severe and feels like she is going to fall when this happens. Pain is more severe radiating down the right leg. She feels the pain radiating down the poterolateral aspect of her thigh and leg. She is helping her daughter out with her kids while she undergoes treatment for cancer.   Previous treatments: NSAIDs, tylenol, PT, activity modification, lyrica, muscle relaxer   Physical Exam:  General: no acute distress, appears stated age Neurologic: alert, answering questions appropriately, following commands Respiratory: unlabored breathing on room air, symmetric chest rise Psychiatric: appropriate affect, normal cadence to speech   MSK (spine):  -Strength exam                                                   Left                  Right EHL                              4/5                  4/5 TA                                 5/5                  5/5 GSC                             5/5                   5/5 Knee extension            5/5                  5/5 Hip flexion                    5/5                  5/5   -Sensory exam                           Sensation intact to light touch in L3-S1 nerve distributions of bilateral lower extremities   -  Positive Hoffmann bilaterally -Achilles DTR: 2/4 on the left, 2/4 on the right -Patellar tendon DTR: 1/4 on the left, 1/4 on the right   -Straight leg raise: negative -Contralateral straight leg raise: negative -Clonus: no beats bilaterally   -Left hip exam: no pain through range of motion, negative stinchfield -Right hip exam: no pain through range of motion, negative stinchfield  Imaging: XR of the lumbar spine from 10/09/2022 was independently reviewed and interpreted, showing spondylolisthesis at L5/S1 that shifts 1 mm between flexion and extension.  Disc height loss at multiple levels throughout the lumbar spine.  No fracture seen. PI of 46, LL of 44.    MRI of the lumbar spine from was previously independently reviewed and interpreted, showing DDD at multiple levels.  Spondylolisthesis at L5/S1.  Central lateral recess stenosis at L4-5, lateral recess stenosis at L5-S1.  Bilateral foraminal stenosis at L4-5 and L5-S1   Patient name: [name] Patient MRN: [id number] Date of visit: 10/09/22

## 2022-10-15 ENCOUNTER — Ambulatory Visit: Payer: 59 | Admitting: Pharmacist

## 2022-10-18 ENCOUNTER — Telehealth: Payer: Self-pay | Admitting: Orthopedic Surgery

## 2022-10-18 MED ORDER — PREGABALIN 50 MG PO CAPS: 50.0000 mg | ORAL_CAPSULE | Freq: Three times a day (TID) | ORAL | 2 refills | Status: DC

## 2022-10-18 NOTE — Telephone Encounter (Signed)
Patient called in stating she is ready to schedule surgery please advise

## 2022-10-18 NOTE — Telephone Encounter (Signed)
I left voicemail advising. ?

## 2022-10-18 NOTE — Telephone Encounter (Signed)
Pt called requesting a refill of pregablin. Please send to pharmacy on file. Pt phone number is [phone].

## 2022-10-24 ENCOUNTER — Ambulatory Visit (INDEPENDENT_AMBULATORY_CARE_PROVIDER_SITE_OTHER): Payer: 59 | Admitting: Pharmacist

## 2022-10-24 ENCOUNTER — Encounter: Payer: Self-pay | Admitting: Pharmacist

## 2022-10-24 VITALS — BP 106/62 | HR 79 | Ht 62.0 in | Wt 173.0 lb

## 2022-10-24 DIAGNOSIS — Z794 Long term (current) use of insulin: Secondary | ICD-10-CM

## 2022-10-24 DIAGNOSIS — E119 Type 2 diabetes mellitus without complications: Secondary | ICD-10-CM

## 2022-10-24 MED ORDER — LANTUS SOLOSTAR 100 UNIT/ML ~~LOC~~ SOPN
40.0000 [IU] | PEN_INJECTOR | Freq: Every day | SUBCUTANEOUS | 99 refills | Status: DC
Start: 2022-10-24 — End: 2023-01-30

## 2022-10-24 MED ORDER — TRULICITY 3 MG/0.5ML ~~LOC~~ SOAJ
3.0000 mg | SUBCUTANEOUS | 2 refills | Status: DC
Start: 2022-10-24 — End: 2023-01-01

## 2022-10-24 MED ORDER — INSULIN LISPRO (1 UNIT DIAL) 100 UNIT/ML (KWIKPEN)
15.0000 [IU] | PEN_INJECTOR | Freq: Every day | SUBCUTANEOUS | 11 refills | Status: DC
Start: 2022-10-24 — End: 2023-01-30

## 2022-10-24 NOTE — Assessment & Plan Note (Signed)
Diabetes for 3-4 years currently uncontrolled with most recent A1c 8.9% in April 2024. Patient is able to verbalize appropriate hypoglycemia management plan. Medication adherence appears fair. Control is suboptimal due to adherence, changing insulin. -Changed basal insulin from Levemir (insulin detemir) 30 (TID) to Lantus (insulin glargine) 40 units daily -Started rapid insulin Humalog (insulin lispro) at 15 units with LUNCH only. -Increased dose of GLP-1 Trulicity (dulaglutide) at 3mg  weekly.   -Continued SGLT2-I Farxiga (dapagliflozin) 10mg  daily.  Counseled on sick day rules. -Patient educated on purpose, proper use, and potential adverse effects of nausea with GLP dose increase.

## 2022-10-24 NOTE — Telephone Encounter (Signed)
Lmom for pt to call the office back to schedule appt to discuss surgery in detail

## 2022-10-24 NOTE — Patient Instructions (Addendum)
It was nice to see you today!  Your goal blood sugar is 80-130 before eating and less than 180 after eating.  Medication Changes: Increase Trulicity to 3 mg weekly (dark blue box).  Start taking Lantus (insulin glargine) 40 units daily in the morning. This is the long-acting insulin. Start taking Humalog (insulin lispro) 15 units daily with LUNCH. This is the short-acting insulin.   Discontinue Levemir.   Monitor blood sugars at home and keep a log (glucometer or piece of paper) to bring with you to your next visit.  Keep up the good work with diet and exercise. Aim for a diet full of vegetables, fruit and lean meats (chicken, [place], fish). Try to limit salt intake by eating fresh or frozen vegetables (instead of canned), rinse canned vegetables prior to cooking and do not add any additional salt to meals.

## 2022-10-24 NOTE — Progress Notes (Signed)
S:     Chief Complaint  Patient presents with   Medication Management    DM follow up   67 y.o. female who presents for diabetes evaluation, education, and management.  Invited to appointment to handle transition from Levemir (insulin detemir) to another insulin regimen due to the discontinuation of her current insulin.   PMH is significant for diabetes, back pain. Patient was referred and last seen by Primary Care Provider, Dr. Barbaraann Faster, on 09/18/22.   Today, patient arrives in good spirits and presents without any assistance. Patient is slow to get up due to back pain and dizziness.  Patient reports Diabetes was diagnosed in ~2020.  Lives with daughter, grandsons (ages 49 and 109)  Current diabetes medications include: dapagliflozin Marcelline Deist) 10 mg daily, dulaglutide (Trulicity) 1.5 mg weekly (Saturdays), insulin detemir vials (Levemir) prescribed: 30 units daily - patient taking 30 units TID with meals. Current hypertension medications include: None Current hyperlipidemia medications include: rosuvastatin 20 mg daily, fenofibrate 160 mg daily  Patient reports adherence to taking all medications, taking insulin detemir TID instead of once daily.   Do you feel that your medications are working for you? yes Have you been experiencing any side effects to the medications prescribed? no Do you have any problems obtaining medications due to transportation or finances? no Insurance coverage: [employer]  Patient denies hypoglycemic events.  Reported home fasting blood sugars: 180s; >150 3-4 days a week Reported 2 hour post-meal/random blood sugars: 200s, up to 280s  Patient denies nocturia (nighttime urination).  Patient denies neuropathy (nerve pain). Patient denies visual changes. Patient denies self foot exams.   Patient reported dietary habits: Eats 3 meals/day Breakfast: 1 bowl of cereal Lunch: Sandwich Dinner: Meat + 2 vegetables Drinks: water; no teas or  sodas  Within the past 12 months, did you worry whether your food would run out before you got money to buy more? no Within the past 12 months, did the food you bought run out, and you didn't have money to get more? no  Patient-reported exercise habits: Limited due to back pain.  O:   Review of Systems  Musculoskeletal:  Positive for back pain.  All other systems reviewed and are negative.   Physical Exam Constitutional:      Appearance: Normal appearance.  Pulmonary:     Effort: Pulmonary effort is normal.  Neurological:     Mental Status: She is alert.  Psychiatric:        Mood and Affect: Mood normal.        Behavior: Behavior normal.        Thought Content: Thought content normal.        Judgment: Judgment normal.     Lab Results  Component Value Date   HGBA1C 8.9 (A) 09/18/2022   Vitals:   10/24/22 1047  BP: 106/62  Pulse: 79  SpO2: 96%    Lipid Panel     Component Value Date/Time   CHOL 180 10/05/2021 0253   TRIG 276 (H) 10/05/2021 0253   HDL 34 (L) 10/05/2021 0253   CHOLHDL 5.3 10/05/2021 0253   VLDL 55 (H) 10/05/2021 0253   LDLCALC 91 10/05/2021 0253    Clinical Atherosclerotic Cardiovascular Disease (ASCVD): No  The 10-year ASCVD risk score (Arnett DK, et al., 2019) is: 7.8%   Values used to calculate the score:     Age: 63 years     Sex: Female     Is Non-Hispanic African American: No  Diabetic: Yes     Tobacco smoker: No     Systolic Blood Pressure: 106 mmHg     Is BP treated: No     HDL Cholesterol: 34 mg/dL     Total Cholesterol: 180 mg/dL   A/P: Diabetes for 3-4 years currently uncontrolled with most recent A1c 8.9% in April 2024. Patient is able to verbalize appropriate hypoglycemia management plan. Medication adherence appears fair. Control is suboptimal due to adherence, changing insulin. -Changed basal insulin from Levemir (insulin detemir) 30 (TID) to Lantus (insulin glargine) 40 units daily -Started rapid insulin Humalog  (insulin lispro) at 15 units with LUNCH only. -Increased dose of GLP-1 Trulicity (dulaglutide) at 3mg  weekly.   -Continued SGLT2-I Farxiga (dapagliflozin) 10mg  daily.  Counseled on sick day rules. -Patient educated on purpose, proper use, and potential adverse effects of nausea with GLP dose increase. -Extensively discussed pathophysiology of diabetes, recommended lifestyle interventions, dietary effects on blood sugar control.  -Counseled on s/sx of and management of hypoglycemia.    Written patient instructions provided. Patient verbalized understanding of treatment plan.  Total time in face to face counseling 23 minutes.    Follow-up:  Pharmacist 1 month. Patient seen with Haze Boyden PharmD Candidate.

## 2022-10-25 NOTE — Progress Notes (Signed)
Reviewed and agree with Dr Koval's plan.   

## 2022-11-04 ENCOUNTER — Other Ambulatory Visit: Payer: Self-pay | Admitting: Student

## 2022-11-04 DIAGNOSIS — E119 Type 2 diabetes mellitus without complications: Secondary | ICD-10-CM

## 2022-11-13 ENCOUNTER — Ambulatory Visit: Payer: 59 | Admitting: Pharmacist

## 2022-11-14 ENCOUNTER — Ambulatory Visit: Payer: 59 | Admitting: Orthopedic Surgery

## 2022-11-25 ENCOUNTER — Other Ambulatory Visit: Payer: Self-pay | Admitting: Orthopedic Surgery

## 2022-11-25 ENCOUNTER — Other Ambulatory Visit: Payer: Self-pay | Admitting: Student

## 2022-11-25 DIAGNOSIS — E119 Type 2 diabetes mellitus without complications: Secondary | ICD-10-CM

## 2022-11-26 ENCOUNTER — Other Ambulatory Visit: Payer: Self-pay | Admitting: Radiology

## 2022-11-26 MED ORDER — METHOCARBAMOL 500 MG PO TABS: 500.0000 mg | ORAL_TABLET | Freq: Four times a day (QID) | ORAL | 1 refills | Status: DC | PRN

## 2022-11-30 ENCOUNTER — Other Ambulatory Visit: Payer: Self-pay | Admitting: Student

## 2022-12-01 ENCOUNTER — Other Ambulatory Visit: Payer: Self-pay | Admitting: Student

## 2022-12-01 DIAGNOSIS — E119 Type 2 diabetes mellitus without complications: Secondary | ICD-10-CM

## 2022-12-01 MED ORDER — DAPAGLIFLOZIN PROPANEDIOL 10 MG PO TABS: 10.0000 mg | ORAL_TABLET | Freq: Every day | ORAL | 1 refills | Status: DC

## 2022-12-16 ENCOUNTER — Other Ambulatory Visit: Payer: Self-pay | Admitting: Radiology

## 2022-12-16 MED ORDER — METHOCARBAMOL 500 MG PO TABS: 500.0000 mg | ORAL_TABLET | Freq: Four times a day (QID) | ORAL | 1 refills | Status: DC | PRN

## 2022-12-18 ENCOUNTER — Other Ambulatory Visit: Payer: Self-pay | Admitting: *Deleted

## 2023-01-01 ENCOUNTER — Other Ambulatory Visit: Payer: Self-pay | Admitting: *Deleted

## 2023-01-01 DIAGNOSIS — E119 Type 2 diabetes mellitus without complications: Secondary | ICD-10-CM

## 2023-01-02 MED ORDER — TRULICITY 3 MG/0.5ML ~~LOC~~ SOAJ: 3.0000 mg | SUBCUTANEOUS | 2 refills | Status: DC

## 2023-01-02 MED ORDER — DAPAGLIFLOZIN PROPANEDIOL 5 MG PO TABS: 10.0000 mg | ORAL_TABLET | Freq: Every day | ORAL | 3 refills | Status: DC

## 2023-01-10 ENCOUNTER — Other Ambulatory Visit: Payer: Self-pay | Admitting: *Deleted

## 2023-01-10 ENCOUNTER — Telehealth: Payer: Self-pay

## 2023-01-10 MED ORDER — AMITRIPTYLINE HCL 50 MG PO TABS: 25.0000 mg | ORAL_TABLET | Freq: Every day | ORAL | 1 refills | Status: DC

## 2023-01-10 NOTE — Telephone Encounter (Signed)
[facility] sent over fax informing patient wants a new prescription for AMITRIPTYLINE 50MG  tablet. Penni Bombard CMA

## 2023-01-30 ENCOUNTER — Telehealth: Payer: Self-pay

## 2023-01-30 ENCOUNTER — Other Ambulatory Visit: Payer: Self-pay | Admitting: Student

## 2023-01-30 DIAGNOSIS — E119 Type 2 diabetes mellitus without complications: Secondary | ICD-10-CM

## 2023-01-30 MED ORDER — INSULIN LISPRO (1 UNIT DIAL) 100 UNIT/ML (KWIKPEN)
15.0000 [IU] | PEN_INJECTOR | Freq: Every day | SUBCUTANEOUS | 2 refills | Status: AC
Start: 2023-01-30 — End: ?

## 2023-01-30 MED ORDER — LANTUS SOLOSTAR 100 UNIT/ML ~~LOC~~ SOPN
40.0000 [IU] | PEN_INJECTOR | Freq: Every day | SUBCUTANEOUS | 5 refills | Status: AC
Start: 2023-01-30 — End: ?

## 2023-01-30 MED ORDER — "INSULIN SYRINGE-NEEDLE U-100 31G X 5/16"" 0.3 ML MISC": 2 refills | Status: DC

## 2023-01-30 NOTE — Telephone Encounter (Signed)
Patient calls nurse line requesting a prescription for pen needles.   She reports she needs these for her Lantus and Humalog pens.   Will forward to PCP.

## 2023-01-30 NOTE — Telephone Encounter (Signed)
Refills sent in, Thank you!

## 2023-01-30 NOTE — Progress Notes (Signed)
Refill for pens sent in

## 2023-02-10 ENCOUNTER — Telehealth: Payer: Self-pay

## 2023-02-10 DIAGNOSIS — E119 Type 2 diabetes mellitus without complications: Secondary | ICD-10-CM

## 2023-02-10 NOTE — Telephone Encounter (Signed)
Patient calls nurse line requesting prescription for pen needles for Humalog. This is not on current medication list.   Requesting that [facility] [place].   Veronda Prude, RN

## 2023-02-11 ENCOUNTER — Other Ambulatory Visit: Payer: Self-pay

## 2023-02-11 MED ORDER — INSULIN PEN NEEDLE 32G X 4 MM MISC
1 refills | Status: AC
Start: 2023-02-11 — End: ?

## 2023-02-11 NOTE — Telephone Encounter (Signed)
Patient calls nurse line requesting refills on Zoloft and Pregabalin.   [facility] [place].   Will forward to PCP.

## 2023-02-11 NOTE — Telephone Encounter (Signed)
Medication to pharmacy Terisa Starr, MD  [facility] Teaching Service

## 2023-02-12 MED ORDER — SERTRALINE HCL 100 MG PO TABS: 200.0000 mg | ORAL_TABLET | Freq: Every day | ORAL | 1 refills | Status: AC

## 2023-02-12 MED ORDER — PREGABALIN 50 MG PO CAPS: 50.0000 mg | ORAL_CAPSULE | Freq: Three times a day (TID) | ORAL | 2 refills | Status: DC

## 2023-02-14 MED ORDER — PREGABALIN 50 MG PO CAPS: 50.0000 mg | ORAL_CAPSULE | Freq: Three times a day (TID) | ORAL | 2 refills | Status: AC

## 2023-02-14 NOTE — Telephone Encounter (Signed)
Patient calls nurse line. She is currently in [place], [place] and needs rx for pregabalin to be sent there.   [facility] [place]   Pended medication to preferred pharmacy in [place].   Veronda Prude, RN

## 2023-02-14 NOTE — Addendum Note (Signed)
Addended by: Veronda Prude on: 02/14/2023 03:19 PM   Modules accepted: Orders

## 2023-02-18 ENCOUNTER — Other Ambulatory Visit: Payer: Self-pay | Admitting: Student

## 2023-02-18 DIAGNOSIS — E119 Type 2 diabetes mellitus without complications: Secondary | ICD-10-CM

## 2023-02-20 ENCOUNTER — Other Ambulatory Visit: Payer: Self-pay | Admitting: Student

## 2023-02-25 ENCOUNTER — Other Ambulatory Visit: Payer: Self-pay | Admitting: Student

## 2023-03-24 ENCOUNTER — Other Ambulatory Visit: Payer: Self-pay | Admitting: Orthopedic Surgery

## 2023-06-02 ENCOUNTER — Telehealth: Payer: Self-pay | Admitting: Pharmacist

## 2023-06-02 NOTE — Telephone Encounter (Signed)
 Attempted to contact patient for follow-up of Levemir  (insulin  detemir)  Use clarification.   I saw patient in May 2024 for transition from Levemir  (insulin  detemir) to Lantus  (insulin  glargine).  Appears from refill records that patient filled Lantus  (insulin  glargine) in 12/2022.  As her profile lists Levemir  as a current medication (not deleted at time of most recent refill) I contacted patient to clarify insulin  regime, assess glycemic control AND discuss possible interest in CGM use with her insulin  regimen.   Left HIPAA compliant voice mail requesting call back to direct phone: [phone]  Total time with patient call and documentation of interaction: 11 minutes.  Follow-up phone call planned: 1 month if Levemir  issue unresolved (to clarify clear medication insulin  regime).

## 2023-06-03 NOTE — Telephone Encounter (Signed)
 Reviewed and agree with Dr Macky Lower plan.

## 2023-11-04 ENCOUNTER — Encounter: Payer: Self-pay | Admitting: *Deleted

## 2024-02-24 ENCOUNTER — Other Ambulatory Visit: Payer: Self-pay

## 2024-02-24 MED ORDER — INSULIN SYRINGE-NEEDLE U-100 31G X 5/16" 0.3 ML MISC: 2 refills | Status: AC
